# Patient Record
Sex: Female | Born: 1990 | Race: White | Hispanic: No | Marital: Married | Smoking: Former smoker
Health system: Southern US, Community
[De-identification: ages and names within clinical notes are randomized; demographics above are authoritative.]

## PROBLEM LIST (undated history)

## (undated) DIAGNOSIS — K219 Gastro-esophageal reflux disease without esophagitis: Secondary | ICD-10-CM

## (undated) DIAGNOSIS — R0602 Shortness of breath: Secondary | ICD-10-CM

## (undated) DIAGNOSIS — K859 Acute pancreatitis without necrosis or infection, unspecified: Secondary | ICD-10-CM

## (undated) DIAGNOSIS — Z7189 Other specified counseling: Secondary | ICD-10-CM

## (undated) DIAGNOSIS — K589 Irritable bowel syndrome without diarrhea: Secondary | ICD-10-CM

## (undated) DIAGNOSIS — I10 Essential (primary) hypertension: Secondary | ICD-10-CM

## (undated) DIAGNOSIS — K760 Fatty (change of) liver, not elsewhere classified: Secondary | ICD-10-CM

## (undated) DIAGNOSIS — M549 Dorsalgia, unspecified: Secondary | ICD-10-CM

## (undated) DIAGNOSIS — D649 Anemia, unspecified: Secondary | ICD-10-CM

## (undated) DIAGNOSIS — Z9049 Acquired absence of other specified parts of digestive tract: Secondary | ICD-10-CM

## (undated) DIAGNOSIS — M2550 Pain in unspecified joint: Secondary | ICD-10-CM

## (undated) DIAGNOSIS — K829 Disease of gallbladder, unspecified: Secondary | ICD-10-CM

## (undated) DIAGNOSIS — F429 Obsessive-compulsive disorder, unspecified: Secondary | ICD-10-CM

## (undated) DIAGNOSIS — F419 Anxiety disorder, unspecified: Secondary | ICD-10-CM

## (undated) DIAGNOSIS — K5190 Ulcerative colitis, unspecified, without complications: Secondary | ICD-10-CM

## (undated) DIAGNOSIS — F329 Major depressive disorder, single episode, unspecified: Secondary | ICD-10-CM

## (undated) DIAGNOSIS — F32A Depression, unspecified: Secondary | ICD-10-CM

## (undated) HISTORY — DX: Other specified counseling: Z71.89

## (undated) HISTORY — DX: Major depressive disorder, single episode, unspecified: F32.9

## (undated) HISTORY — DX: Pain in unspecified joint: M25.50

## (undated) HISTORY — PX: TOTAL COLECTOMY: SHX852

## (undated) HISTORY — DX: Irritable bowel syndrome, unspecified: K58.9

## (undated) HISTORY — DX: Essential (primary) hypertension: I10

## (undated) HISTORY — DX: Acquired absence of other specified parts of digestive tract: Z90.49

## (undated) HISTORY — DX: Obsessive-compulsive disorder, unspecified: F42.9

## (undated) HISTORY — DX: Gastro-esophageal reflux disease without esophagitis: K21.9

## (undated) HISTORY — DX: Depression, unspecified: F32.A

## (undated) HISTORY — DX: Anxiety disorder, unspecified: F41.9

## (undated) HISTORY — PX: LAPAROSCOPIC PARTIAL PROTECTOMY: SHX5912

## (undated) HISTORY — DX: Fatty (change of) liver, not elsewhere classified: K76.0

## (undated) HISTORY — DX: Disease of gallbladder, unspecified: K82.9

## (undated) HISTORY — PX: CHOLECYSTECTOMY: SHX55

## (undated) HISTORY — PX: PERMANENT ILEOSTOMY: SHX6021

## (undated) HISTORY — DX: Dorsalgia, unspecified: M54.9

## (undated) HISTORY — DX: Anemia, unspecified: D64.9

## (undated) HISTORY — DX: Shortness of breath: R06.02

---

## 2012-11-25 DIAGNOSIS — F429 Obsessive-compulsive disorder, unspecified: Secondary | ICD-10-CM

## 2012-11-25 HISTORY — DX: Obsessive-compulsive disorder, unspecified: F42.9

## 2014-06-25 ENCOUNTER — Other Ambulatory Visit (HOSPITAL_COMMUNITY): Payer: Self-pay

## 2014-06-30 ENCOUNTER — Encounter (HOSPITAL_COMMUNITY)
Admission: RE | Admit: 2014-06-30 | Discharge: 2014-06-30 | Disposition: A | Discharge status: Home / Self Care | Payer: PRIVATE HEALTH INSURANCE | Source: Ambulatory Visit | Attending: Gastroenterology | Admitting: Gastroenterology

## 2014-06-30 DIAGNOSIS — K51918 Ulcerative colitis, unspecified with other complication: Secondary | ICD-10-CM | POA: Diagnosis not present

## 2014-06-30 MED ORDER — SODIUM CHLORIDE 0.9 % IV SOLN
INTRAVENOUS | Status: DC
  Administered 2014-06-30: 10:00:00 via INTRAVENOUS

## 2014-06-30 MED ORDER — SODIUM CHLORIDE 0.9 % IV SOLN
10.0000 mg/kg | INTRAVENOUS | Status: DC
  Administered 2014-06-30: 1000 mg via INTRAVENOUS
  Filled 2014-06-30: qty 100

## 2014-07-17 ENCOUNTER — Inpatient Hospital Stay (HOSPITAL_COMMUNITY)
Admission: EM | Admit: 2014-07-17 | Discharge: 2014-07-20 | DRG: 387 | Disposition: A | Discharge status: Home / Self Care | Payer: PRIVATE HEALTH INSURANCE | Attending: Internal Medicine | Admitting: Internal Medicine

## 2014-07-17 ENCOUNTER — Encounter (HOSPITAL_COMMUNITY): Payer: Self-pay | Admitting: *Deleted

## 2014-07-17 DIAGNOSIS — K51919 Ulcerative colitis, unspecified with unspecified complications: Secondary | ICD-10-CM | POA: Diagnosis not present

## 2014-07-17 DIAGNOSIS — E860 Dehydration: Secondary | ICD-10-CM | POA: Diagnosis present

## 2014-07-17 DIAGNOSIS — Z888 Allergy status to other drugs, medicaments and biological substances status: Secondary | ICD-10-CM

## 2014-07-17 DIAGNOSIS — F419 Anxiety disorder, unspecified: Secondary | ICD-10-CM | POA: Diagnosis present

## 2014-07-17 DIAGNOSIS — K5190 Ulcerative colitis, unspecified, without complications: Secondary | ICD-10-CM | POA: Diagnosis not present

## 2014-07-17 HISTORY — DX: Ulcerative colitis, unspecified, without complications: K51.90

## 2014-07-17 HISTORY — DX: Acute pancreatitis without necrosis or infection, unspecified: K85.90

## 2014-07-17 LAB — CBC WITH DIFFERENTIAL/PLATELET
Basophils Absolute: 0 10*3/uL (ref 0.0–0.1)
Basophils Relative: 0 % (ref 0–1)
Eosinophils Absolute: 0.1 10*3/uL (ref 0.0–0.7)
Eosinophils Relative: 1 % (ref 0–5)
HCT: 33.4 % — ABNORMAL LOW (ref 36.0–46.0)
Hemoglobin: 10.4 g/dL — ABNORMAL LOW (ref 12.0–15.0)
Lymphocytes Relative: 30 % (ref 12–46)
Lymphs Abs: 4.3 10*3/uL — ABNORMAL HIGH (ref 0.7–4.0)
MCH: 25.2 pg — ABNORMAL LOW (ref 26.0–34.0)
MCHC: 31.1 g/dL (ref 30.0–36.0)
MCV: 80.9 fL (ref 78.0–100.0)
Monocytes Absolute: 1.3 10*3/uL — ABNORMAL HIGH (ref 0.1–1.0)
Monocytes Relative: 9 % (ref 3–12)
Neutro Abs: 8.5 10*3/uL — ABNORMAL HIGH (ref 1.7–7.7)
Neutrophils Relative %: 60 % (ref 43–77)
Platelets: 648 10*3/uL — ABNORMAL HIGH (ref 150–400)
RBC: 4.13 MIL/uL (ref 3.87–5.11)
RDW: 16.1 % — ABNORMAL HIGH (ref 11.5–15.5)
WBC Morphology: INCREASED
WBC: 14.2 10*3/uL — ABNORMAL HIGH (ref 4.0–10.5)

## 2014-07-17 LAB — COMPREHENSIVE METABOLIC PANEL
ALT: 30 U/L (ref 0–35)
AST: 31 U/L (ref 0–37)
Albumin: 2.2 g/dL — ABNORMAL LOW (ref 3.5–5.2)
Alkaline Phosphatase: 105 U/L (ref 39–117)
Anion gap: 9 (ref 5–15)
BUN: 7 mg/dL (ref 6–23)
CO2: 27 mmol/L (ref 19–32)
Calcium: 8.1 mg/dL — ABNORMAL LOW (ref 8.4–10.5)
Chloride: 97 mEq/L (ref 96–112)
Creatinine, Ser: 1.16 mg/dL — ABNORMAL HIGH (ref 0.50–1.10)
GFR calc Af Amer: 75 mL/min — ABNORMAL LOW (ref >90)
GFR calc non Af Amer: 65 mL/min — ABNORMAL LOW (ref >90)
Glucose, Bld: 129 mg/dL — ABNORMAL HIGH (ref 70–99)
Potassium: 3.2 mmol/L — ABNORMAL LOW (ref 3.5–5.1)
Sodium: 133 mmol/L — ABNORMAL LOW (ref 135–145)
Total Bilirubin: 0.2 mg/dL — ABNORMAL LOW (ref 0.3–1.2)
Total Protein: 6.3 g/dL (ref 6.0–8.3)

## 2014-07-17 LAB — MAGNESIUM: Magnesium: 1.6 mg/dL (ref 1.5–2.5)

## 2014-07-17 LAB — LIPASE, BLOOD: Lipase: 22 U/L (ref 11–59)

## 2014-07-17 MED ORDER — SODIUM CHLORIDE 0.9 % IV BOLUS (SEPSIS)
1000.0000 mL | Freq: Once | INTRAVENOUS | Status: AC
  Administered 2014-07-17: 1000 mL via INTRAVENOUS

## 2014-07-17 MED ORDER — SODIUM CHLORIDE 0.9 % IV SOLN
INTRAVENOUS | Status: DC
  Administered 2014-07-18 – 2014-07-19 (×3): via INTRAVENOUS

## 2014-07-17 MED ORDER — ONDANSETRON HCL 4 MG/2ML IJ SOLN
4.0000 mg | Freq: Once | INTRAMUSCULAR | Status: AC
  Administered 2014-07-17: 4 mg via INTRAVENOUS
  Filled 2014-07-17: qty 2

## 2014-07-17 MED ORDER — POTASSIUM CHLORIDE 10 MEQ/100ML IV SOLN
10.0000 meq | INTRAVENOUS | Status: AC
  Administered 2014-07-18 (×3): 10 meq via INTRAVENOUS
  Filled 2014-07-17 (×3): qty 100

## 2014-07-17 MED ORDER — MORPHINE SULFATE 4 MG/ML IJ SOLN
4.0000 mg | Freq: Once | INTRAMUSCULAR | Status: AC
  Administered 2014-07-18: 4 mg via INTRAVENOUS
  Filled 2014-07-17: qty 1

## 2014-07-17 NOTE — ED Notes (Signed)
Pt reports a UC flare up for the past month but in the past week abdominal pain and n/v has been much worse. Pt reports bloody diarrhea, decrease in food and fluid intake, and feeling dehydrated. Pt's HR 147 in triage.

## 2014-07-17 NOTE — ED Provider Notes (Signed)
CSN: [id number]     Arrival date & time 07/17/14  2158 History   First MD Initiated Contact with Patient 07/17/14 2256     Chief Complaint  Patient presents with  . Abdominal Pain  . Nausea  . Emesis     (Consider location/radiation/quality/duration/timing/severity/associated sxs/prior Treatment) HPI Comments: Patient with a history of ulcerative colitis received a Remicade injection by Dr. Ronny Flurry on the fourth.  She's recently moved from [place] to [place].  She states she's had ulcerative colitis for many years.  Had gallbladder surgery but other than that, no intestinal surgery reports that her ulcerative colitis.  His pain colitis, not a specific area of her intestines.  She is on chronic steroids at 15 mg per day.  He is currently on magnesium supplementation.  Protonix, birth control, and Xanax for anxiety. Reports that for the past 6 weeks.  She's had chronic loose stools which contain small blood she's had nausea and vomiting.  She does have Zofran at home, which she used yesterday but none today.  She's been trying to sip on fluids and tolerating small amounts at a time.  She reports that the diarrhea has worsened over the past week to 10-15 bowel movements per day  Patient is a 24 y.o. female presenting with abdominal pain and vomiting. The history is provided by the patient.  Abdominal Pain Pain location:  Generalized (rectal) Pain quality: cramping   Pain severity:  Moderate Onset quality:  Gradual Timing:  Constant Progression:  Worsening Chronicity:  Chronic Context: retching   Context comment:  Ulceratie colitis Relieved by:  Nothing Worsened by:  Eating Ineffective treatments:  None tried Associated symptoms: diarrhea, hematochezia, nausea and vomiting   Associated symptoms: no chest pain and no fever   Emesis Associated symptoms: abdominal pain and diarrhea     Past Medical History  Diagnosis Date  . UC (ulcerative colitis)   . Pancreatitis    Past  Surgical History  Procedure Laterality Date  . Cholecystectomy     History reviewed. No pertinent family history. History  Substance Use Topics  . Smoking status: Never Smoker   . Smokeless tobacco: Never Used  . Alcohol Use: No   OB History    No data available     Review of Systems  Constitutional: Negative for fever.  Cardiovascular: Negative for chest pain.  Gastrointestinal: Positive for nausea, vomiting, abdominal pain, diarrhea, blood in stool and hematochezia.  All other systems reviewed and are negative.     Allergies  Nsaids  Home Medications   Prior to Admission medications   Medication Sig Start Date End Date Taking? Authorizing Provider  ALPRAZolam Duanne Moron) 0.5 MG tablet Take 0.5 mg by mouth every 8 (eight) hours as needed for anxiety.   Yes Historical Provider, MD  Norgestimate-Ethinyl Estradiol Triphasic 0.18/0.215/0.25 MG-25 MCG tab Take 1 tablet by mouth daily.   Yes Historical Provider, MD  OLANZapine-FLUoxetine (SYMBYAX) 3-25 MG per capsule Take 1 capsule by mouth every evening.   Yes Historical Provider, MD  pantoprazole (PROTONIX) 20 MG tablet Take 20 mg by mouth daily.   Yes Historical Provider, MD  oxyCODONE (OXY IR/ROXICODONE) 5 MG immediate release tablet Take 1 tablet (5 mg total) by mouth every 6 (six) hours as needed for severe pain. 07/20/14   Erline Hau, MD  predniSONE (DELTASONE) 20 MG tablet Take 2 tablets (40 mg total) by mouth daily with breakfast. 07/20/14   Erline Hau, MD   BP 115/63  mmHg  Pulse 83  Temp(Src) 98.3 F (36.8 C) (Oral)  Resp 15  Ht 5' 6"  (1.676 m)  Wt 206 lb 9.6 oz (93.713 kg)  BMI 33.36 kg/m2  SpO2 94%  LMP 07/08/2014 (Exact Date) Physical Exam  Constitutional: She appears well-developed and well-nourished.  HENT:  Head: Normocephalic.  Eyes: Pupils are equal, round, and reactive to light.  Neck: Normal range of motion.  Cardiovascular: Tachycardia present.   Pulmonary/Chest: Effort  normal and breath sounds normal.  Abdominal: Soft.  Musculoskeletal: Normal range of motion.  Neurological: She is alert.  Skin: Skin is warm and dry.  Vitals reviewed.   ED Course  Procedures (including critical care time) Labs Review Labs Reviewed  CBC WITH DIFFERENTIAL - Abnormal; Notable for the following:    WBC 14.2 (*)    Hemoglobin 10.4 (*)    HCT 33.4 (*)    MCH 25.2 (*)    RDW 16.1 (*)    Platelets 648 (*)    Neutro Abs 8.5 (*)    Lymphs Abs 4.3 (*)    Monocytes Absolute 1.3 (*)    All other components within normal limits  COMPREHENSIVE METABOLIC PANEL - Abnormal; Notable for the following:    Sodium 133 (*)    Potassium 3.2 (*)    Glucose, Bld 129 (*)    Creatinine, Ser 1.16 (*)    Calcium 8.1 (*)    Albumin 2.2 (*)    Total Bilirubin 0.2 (*)    GFR calc non Af Amer 65 (*)    GFR calc Af Amer 75 (*)    All other components within normal limits  URINALYSIS, ROUTINE W REFLEX MICROSCOPIC - Abnormal; Notable for the following:    APPearance CLOUDY (*)    Ketones, ur 15 (*)    Nitrite POSITIVE (*)    Leukocytes, UA TRACE (*)    All other components within normal limits  CBC - Abnormal; Notable for the following:    WBC 11.6 (*)    Hemoglobin 10.1 (*)    HCT 32.3 (*)    MCH 25.6 (*)    RDW 16.3 (*)    Platelets 521 (*)    All other components within normal limits  BASIC METABOLIC PANEL - Abnormal; Notable for the following:    Sodium 133 (*)    Glucose, Bld 138 (*)    BUN <5 (*)    Calcium 7.6 (*)    All other components within normal limits  URINE MICROSCOPIC-ADD ON - Abnormal; Notable for the following:    Squamous Epithelial / LPF FEW (*)    Bacteria, UA MANY (*)    All other components within normal limits  BASIC METABOLIC PANEL - Abnormal; Notable for the following:    Glucose, Bld 161 (*)    BUN <5 (*)    Calcium 8.2 (*)    All other components within normal limits  CBC - Abnormal; Notable for the following:    RBC 3.67 (*)    Hemoglobin  9.1 (*)    HCT 30.0 (*)    MCH 24.8 (*)    RDW 15.8 (*)    Platelets 507 (*)    All other components within normal limits  CBC - Abnormal; Notable for the following:    RBC 3.38 (*)    Hemoglobin 8.4 (*)    HCT 27.9 (*)    MCH 24.9 (*)    RDW 16.0 (*)    Platelets 417 (*)    All  other components within normal limits  BASIC METABOLIC PANEL - Abnormal; Notable for the following:    BUN 5 (*)    Calcium 7.8 (*)    GFR calc non Af Amer 83 (*)    All other components within normal limits  CLOSTRIDIUM DIFFICILE BY PCR  LIPASE, BLOOD  PREGNANCY, URINE  MAGNESIUM    Imaging Review No results found.   EKG Interpretation   Date/Time:  Thursday July 17 2014 22:29:31 EST Ventricular Rate:  135 PR Interval:  119 QRS Duration: 79 QT Interval:  295 QTC Calculation: 442 R Axis:   103 Text Interpretation:  Sinus tachycardia Multiform ventricular premature  complexes Aberrant complex Consider right atrial enlargement Borderline  right axis deviation Borderline repolarization abnormality ED PHYSICIAN  INTERPRETATION AVAILABLE IN [facility] Confirmed by TEST, Record  ([id number]) on 07/19/2014 9:10:08 AM      MDM   Final diagnoses:  Ulcerative colitis, unspecified complication         Garald Balding, NP 07/21/14 1954  Julianne Rice, MD 07/22/14 [phone]

## 2014-07-18 DIAGNOSIS — K51919 Ulcerative colitis, unspecified with unspecified complications: Secondary | ICD-10-CM | POA: Diagnosis present

## 2014-07-18 DIAGNOSIS — K51911 Ulcerative colitis, unspecified with rectal bleeding: Secondary | ICD-10-CM | POA: Diagnosis not present

## 2014-07-18 DIAGNOSIS — K51918 Ulcerative colitis, unspecified with other complication: Secondary | ICD-10-CM

## 2014-07-18 DIAGNOSIS — Z888 Allergy status to other drugs, medicaments and biological substances status: Secondary | ICD-10-CM | POA: Diagnosis not present

## 2014-07-18 DIAGNOSIS — E860 Dehydration: Secondary | ICD-10-CM | POA: Diagnosis present

## 2014-07-18 DIAGNOSIS — K5190 Ulcerative colitis, unspecified, without complications: Secondary | ICD-10-CM | POA: Diagnosis present

## 2014-07-18 DIAGNOSIS — F419 Anxiety disorder, unspecified: Secondary | ICD-10-CM | POA: Diagnosis present

## 2014-07-18 LAB — BASIC METABOLIC PANEL
Anion gap: 7 (ref 5–15)
BUN: <5 mg/dL — ABNORMAL LOW (ref 6–23)
CO2: 28 mmol/L (ref 19–32)
Calcium: 7.6 mg/dL — ABNORMAL LOW (ref 8.4–10.5)
Chloride: 98 mEq/L (ref 96–112)
Creatinine, Ser: 0.86 mg/dL (ref 0.50–1.10)
GFR calc Af Amer: >90 mL/min (ref >90)
GFR calc non Af Amer: >90 mL/min (ref >90)
Glucose, Bld: 138 mg/dL — ABNORMAL HIGH (ref 70–99)
Potassium: 4.3 mmol/L (ref 3.5–5.1)
Sodium: 133 mmol/L — ABNORMAL LOW (ref 135–145)

## 2014-07-18 LAB — CBC
HCT: 32.3 % — ABNORMAL LOW (ref 36.0–46.0)
Hemoglobin: 10.1 g/dL — ABNORMAL LOW (ref 12.0–15.0)
MCH: 25.6 pg — ABNORMAL LOW (ref 26.0–34.0)
MCHC: 31.3 g/dL (ref 30.0–36.0)
MCV: 82 fL (ref 78.0–100.0)
Platelets: 521 10*3/uL — ABNORMAL HIGH (ref 150–400)
RBC: 3.94 MIL/uL (ref 3.87–5.11)
RDW: 16.3 % — ABNORMAL HIGH (ref 11.5–15.5)
WBC: 11.6 10*3/uL — ABNORMAL HIGH (ref 4.0–10.5)

## 2014-07-18 LAB — URINALYSIS, ROUTINE W REFLEX MICROSCOPIC
Bilirubin Urine: NEGATIVE
Glucose, UA: NEGATIVE mg/dL
Hgb urine dipstick: NEGATIVE
Ketones, ur: 15 mg/dL — AB
Nitrite: POSITIVE — AB
Protein, ur: NEGATIVE mg/dL
Specific Gravity, Urine: 1.011 (ref 1.005–1.030)
Urobilinogen, UA: 0.2 mg/dL (ref 0.0–1.0)
pH: 6.5 (ref 5.0–8.0)

## 2014-07-18 LAB — URINE MICROSCOPIC-ADD ON

## 2014-07-18 LAB — PREGNANCY, URINE: Preg Test, Ur: NEGATIVE

## 2014-07-18 MED ORDER — NORGESTIM-ETH ESTRAD TRIPHASIC 0.18/0.215/0.25 MG-25 MCG PO TABS
1.0000 | ORAL_TABLET | Freq: Every day | ORAL | Status: DC
  Administered 2014-07-18 – 2014-07-19 (×2): 1 via ORAL

## 2014-07-18 MED ORDER — METHYLPREDNISOLONE SODIUM SUCC 125 MG IJ SOLR: 60.0000 mg | Freq: Two times a day (BID) | INTRAMUSCULAR | Status: DC

## 2014-07-18 MED ORDER — OLANZAPINE-FLUOXETINE HCL 3-25 MG PO CAPS
1.0000 | ORAL_CAPSULE | Freq: Every evening | ORAL | Status: DC
  Administered 2014-07-18 – 2014-07-19 (×2): 1 via ORAL
  Filled 2014-07-18 (×3): qty 1

## 2014-07-18 MED ORDER — PANTOPRAZOLE SODIUM 20 MG PO TBEC
20.0000 mg | DELAYED_RELEASE_TABLET | Freq: Every day | ORAL | Status: DC
  Administered 2014-07-18 – 2014-07-20 (×3): 20 mg via ORAL
  Filled 2014-07-18 (×3): qty 1

## 2014-07-18 MED ORDER — SODIUM CHLORIDE 0.9 % IJ SOLN: 3.0000 mL | Freq: Two times a day (BID) | INTRAMUSCULAR | Status: DC

## 2014-07-18 MED ORDER — ONDANSETRON HCL 4 MG/2ML IJ SOLN
4.0000 mg | Freq: Four times a day (QID) | INTRAMUSCULAR | Status: DC | PRN
  Administered 2014-07-18 – 2014-07-19 (×3): 4 mg via INTRAVENOUS
  Filled 2014-07-18 (×4): qty 2

## 2014-07-18 MED ORDER — ALPRAZOLAM 0.5 MG PO TABS
0.5000 mg | ORAL_TABLET | Freq: Three times a day (TID) | ORAL | Status: DC | PRN
  Filled 2014-07-18: qty 1

## 2014-07-18 MED ORDER — MORPHINE SULFATE 2 MG/ML IJ SOLN
2.0000 mg | INTRAMUSCULAR | Status: DC | PRN
  Administered 2014-07-18 (×5): 4 mg via INTRAVENOUS
  Administered 2014-07-19: 2 mg via INTRAVENOUS
  Administered 2014-07-19: 4 mg via INTRAVENOUS
  Administered 2014-07-19: 2 mg via INTRAVENOUS
  Administered 2014-07-19: 4 mg via INTRAVENOUS
  Filled 2014-07-18 (×2): qty 2
  Filled 2014-07-18: qty 1
  Filled 2014-07-18 (×7): qty 2

## 2014-07-18 MED ORDER — METHYLPREDNISOLONE SODIUM SUCC 125 MG IJ SOLR
60.0000 mg | Freq: Two times a day (BID) | INTRAMUSCULAR | Status: DC
  Administered 2014-07-18 – 2014-07-19 (×4): 60 mg via INTRAVENOUS
  Filled 2014-07-18 (×3): qty 0.96
  Filled 2014-07-18: qty 2
  Filled 2014-07-18: qty 0.96

## 2014-07-18 MED ORDER — MORPHINE SULFATE 4 MG/ML IJ SOLN
4.0000 mg | Freq: Once | INTRAMUSCULAR | Status: AC
  Administered 2014-07-18: 4 mg via INTRAVENOUS
  Filled 2014-07-18: qty 1

## 2014-07-18 MED ORDER — ONDANSETRON HCL 4 MG PO TABS: 4.0000 mg | ORAL_TABLET | Freq: Three times a day (TID) | ORAL | Status: DC | PRN

## 2014-07-18 NOTE — Progress Notes (Signed)
07/18/2014 0955 UR completed. Chart reviewed. Jonnie Finner RN CCM Case Mgmt phone [phone]

## 2014-07-18 NOTE — Progress Notes (Signed)
Patient seen and examined. Admitted earlier today for a UC flare. She feels like pain is somewhat improved and would like to advance her diet. Would like to keep pain meds IV for now and transition to PO in am if tolerates diet.  Domingo Mend, MD [facility] Pager: [phone]

## 2014-07-18 NOTE — Progress Notes (Signed)
Report received from Tiffany, RN.

## 2014-07-18 NOTE — Progress Notes (Signed)
Pt has arrived to the unit.

## 2014-07-18 NOTE — H&P (Signed)
[facility] History and Physical  Diana Page LZJ:[id number] DOB: [birth year] DOA: 07/17/2014  Referring physician: EDP PCP: Pcp Not In System   Chief Complaint: Ulcerative colitis   HPI: Diana Page is a 24 y.o. female who presents to the ED with ulcerative colitis flare up for the past 1 month.  UC has never really been under control but has been especially worse and associated with nausea and vomiting for the past 2-3 days.  She has had UC flares cause N/V in the past as well.  She is currently seeing Dr. Benson Norway who tried her on remicaid earlier this month (which has failed).  No fever, no chills, has significant amount of bloody diarrhea and rectal pain c/w her typical UC symptoms.  Has N/V which she has had in the past with severe UC symptoms.  Review of Systems: Systems reviewed.  As above, otherwise negative  Past Medical History  Diagnosis Date  . UC (ulcerative colitis)   . Pancreatitis    Past Surgical History  Procedure Laterality Date  . Cholecystectomy     Social History:  reports that she has never smoked. She has never used smokeless tobacco. She reports that she does not drink alcohol or use illicit drugs.  Allergies  Allergen Reactions  . Nsaids Other (See Comments)    Because of her UC    History reviewed. No pertinent family history.   Prior to Admission medications   Medication Sig Start Date End Date Taking? Authorizing Provider  ALPRAZolam Duanne Moron) 0.5 MG tablet Take 0.5 mg by mouth every 8 (eight) hours as needed for anxiety.   Yes Historical Provider, MD  Norgestimate-Ethinyl Estradiol Triphasic 0.18/0.215/0.25 MG-25 MCG tab Take 1 tablet by mouth daily.   Yes Historical Provider, MD  OLANZapine-FLUoxetine (SYMBYAX) 3-25 MG per capsule Take 1 capsule by mouth every evening.   Yes Historical Provider, MD  ondansetron (ZOFRAN) 4 MG tablet Take 4 mg by mouth every 8 (eight) hours as needed for nausea or vomiting.   Yes Historical Provider, MD   pantoprazole (PROTONIX) 20 MG tablet Take 20 mg by mouth daily.   Yes Historical Provider, MD  predniSONE (DELTASONE) 5 MG tablet Take 40 mg by mouth daily with breakfast.   Yes Historical Provider, MD   Physical Exam: Filed Vitals:   07/17/14 2209  BP: 133/87  Pulse: 147  Temp: 98.8 F (37.1 C)  Resp: 18    BP 133/87 mmHg  Pulse 147  Temp(Src) 98.8 F (37.1 C) (Oral)  Resp 18  Ht 5' 2"  (1.575 m)  Wt 92.534 kg (204 lb)  BMI 37.30 kg/m2  SpO2 94%  LMP 07/08/2014 (Exact Date)  General Appearance:    Alert, oriented, no distress, appears stated age  Head:    Normocephalic, atraumatic  Eyes:    PERRL, EOMI, sclera non-icteric        Nose:   Nares without drainage or epistaxis. Mucosa, turbinates normal  Throat:   Moist mucous membranes. Oropharynx without erythema or exudate.  Neck:   Supple. No carotid bruits.  No thyromegaly.  No lymphadenopathy.   Back:     No CVA tenderness, no spinal tenderness  Lungs:     Clear to auscultation bilaterally, without wheezes, rhonchi or rales  Chest wall:    No tenderness to palpitation  Heart:    Regular rate and rhythm without murmurs, gallops, rubs  Abdomen:     Soft, non-tender, nondistended, normal bowel sounds, no organomegaly  Genitalia:    deferred  Rectal:    deferred  Extremities:   No clubbing, cyanosis or edema.  Pulses:   2+ and symmetric all extremities  Skin:   Skin color, texture, turgor normal, no rashes or lesions  Lymph nodes:   Cervical, supraclavicular, and axillary nodes normal  Neurologic:   CNII-XII intact. Normal strength, sensation and reflexes      throughout    Labs on Admission:  Basic Metabolic Panel:  Recent Labs Lab 07/17/14 2216 07/17/14 2225  NA 133*  --   K 3.2*  --   CL 97  --   CO2 27  --   GLUCOSE 129*  --   BUN 7  --   CREATININE 1.16*  --   CALCIUM 8.1*  --   MG  --  1.6   Liver Function Tests:  Recent Labs Lab 07/17/14 2216  AST 31  ALT 30  ALKPHOS 105  BILITOT 0.2*   PROT 6.3  ALBUMIN 2.2*    Recent Labs Lab 07/17/14 2216  LIPASE 22   No results for input(s): AMMONIA in the last 168 hours. CBC:  Recent Labs Lab 07/17/14 2216  WBC 14.2*  NEUTROABS 8.5*  HGB 10.4*  HCT 33.4*  MCV 80.9  PLT 648*   Cardiac Enzymes: No results for input(s): CKTOTAL, CKMB, CKMBINDEX, TROPONINI in the last 168 hours.  BNP (last 3 results) No results for input(s): PROBNP in the last 8760 hours. CBG: No results for input(s): GLUCAP in the last 168 hours.  Radiological Exams on Admission: No results found.  EKG: Independently reviewed.  Assessment/Plan Principal Problem:   Ulcerative colitis   1. UC flare - Spoke with Dr. Benson Norway who dosent think she needs another CT scan at this point and that infection seems less likely given her very difficult to control UC and symptoms c/w this as well as her benign abdominal exam. 1. He recommends increasing prednisone dose to 40m PO daily (will have to use solumedrol for now as she isnt really tolerating POs due to N/V). 2. Hydrating with IVF 3. Tele monitor for tachycardia.  Believe tachycardia may be due to dehydration, given that we have already seen this improve to the low 100s after IVF in the ED 4. Clear liquid diet. 5. Zofran PRN nausea, morphine PRN pain 6. Repeat CBC in AM to trend leukocytosis. 7. Dr. HBenson Norwayis uncertain that he will be able to make it in to the hospital at all tomorrow due to the exceptional weather that is forecast for the local area. 8. He does wish to switch biologics as outpatient, plans on trying 2 other biologics at this point.  If all that fails then patient may need colectomy. 2. SCDs for DVT ppx    Code Status: Full Code  Family Communication: No family in room Disposition Plan: Admit to inpatient   Time spent: 79min  GARDNER, JARED M. [facility] Pager [phone] If 7AM-7PM, please contact the day team taking care of the patient [email or web address] Password  [email or web address] 07/18/2014, 1:26 AM

## 2014-07-19 LAB — CBC
HCT: 30 % — ABNORMAL LOW (ref 36.0–46.0)
Hemoglobin: 9.1 g/dL — ABNORMAL LOW (ref 12.0–15.0)
MCH: 24.8 pg — ABNORMAL LOW (ref 26.0–34.0)
MCHC: 30.3 g/dL (ref 30.0–36.0)
MCV: 81.7 fL (ref 78.0–100.0)
Platelets: 507 10*3/uL — ABNORMAL HIGH (ref 150–400)
RBC: 3.67 MIL/uL — ABNORMAL LOW (ref 3.87–5.11)
RDW: 15.8 % — ABNORMAL HIGH (ref 11.5–15.5)
WBC: 9.6 10*3/uL (ref 4.0–10.5)

## 2014-07-19 LAB — BASIC METABOLIC PANEL
Anion gap: 9 (ref 5–15)
BUN: <5 mg/dL — ABNORMAL LOW (ref 6–23)
CO2: 25 mmol/L (ref 19–32)
Calcium: 8.2 mg/dL — ABNORMAL LOW (ref 8.4–10.5)
Chloride: 103 mmol/L (ref 96–112)
Creatinine, Ser: 0.85 mg/dL (ref 0.50–1.10)
GFR calc Af Amer: >90 mL/min (ref >90)
GFR calc non Af Amer: >90 mL/min (ref >90)
Glucose, Bld: 161 mg/dL — ABNORMAL HIGH (ref 70–99)
Potassium: 4.6 mmol/L (ref 3.5–5.1)
Sodium: 137 mmol/L (ref 135–145)

## 2014-07-19 MED ORDER — PREDNISONE 50 MG PO TABS
60.0000 mg | ORAL_TABLET | Freq: Every day | ORAL | Status: DC
  Administered 2014-07-20: 60 mg via ORAL
  Filled 2014-07-19 (×2): qty 1

## 2014-07-19 MED ORDER — OXYCODONE HCL 5 MG PO TABS
5.0000 mg | ORAL_TABLET | Freq: Four times a day (QID) | ORAL | Status: DC | PRN
  Administered 2014-07-19 – 2014-07-20 (×2): 5 mg via ORAL
  Filled 2014-07-19 (×2): qty 1

## 2014-07-19 NOTE — Progress Notes (Signed)
[facility] PROGRESS NOTE  Diana Page JAS:[id number] DOB: 1991-05-01 DOA: 07/17/2014 PCP: Pcp Not In System  Assessment/Plan: Ulcerative Colitis with Acute Flare -Much improved today. -Tolerating solid diet. -Will transition pain meds/steroids to PO. -Would like to DC in am if feeling better. -Will follow up as an OP with Dr. Benson Norway.  Code Status: Full Code Family Communication: Patient only  Disposition Plan: Home when ready; likely in 24-48 hours.   Consultants:  None   Antibiotics:  None   Subjective: Feels much better; no N/V, less abdominal pain, minimal rectal bleeding.  Objective: Filed Vitals:   07/18/14 1310 07/18/14 2107 07/19/14 0522 07/19/14 1531  BP: 114/64 133/82 109/66 126/61  Pulse:  73 68   Temp: 98.7 F (37.1 C) 97.6 F (36.4 C) 97.5 F (36.4 C) 98.3 F (36.8 C)  TempSrc: Oral   Oral  Resp: 16  17 18   Height:      Weight:      SpO2: 95% 97% 97% 97%    Intake/Output Summary (Last 24 hours) at 07/19/14 1639 Last data filed at 07/18/14 1854  Gross per 24 hour  Intake 1362.5 ml  Output      0 ml  Net 1362.5 ml   Filed Weights   07/17/14 2209 07/18/14 0221  Weight: 92.534 kg (204 lb) 93.713 kg (206 lb 9.6 oz)    Exam:   General:  AA Ox3  Cardiovascular: RRR  Respiratory: CTA B  Abdomen: S/NT/ND/+BS  Extremities: no C/C/E   Neurologic:  Intact/non-focal  Data Reviewed: Basic Metabolic Panel:  Recent Labs Lab 07/17/14 2216 07/17/14 2225 07/18/14 0553 07/19/14 0551  NA 133*  --  133* 137  K 3.2*  --  4.3 4.6  CL 97  --  98 103  CO2 27  --  28 25  GLUCOSE 129*  --  138* 161*  BUN 7  --  <5* <5*  CREATININE 1.16*  --  0.86 0.85  CALCIUM 8.1*  --  7.6* 8.2*  MG  --  1.6  --   --    Liver Function Tests:  Recent Labs Lab 07/17/14 2216  AST 31  ALT 30  ALKPHOS 105  BILITOT 0.2*  PROT 6.3  ALBUMIN 2.2*    Recent Labs Lab 07/17/14 2216  LIPASE 22   No results for input(s): AMMONIA in the  last 168 hours. CBC:  Recent Labs Lab 07/17/14 2216 07/18/14 0553 07/19/14 0551  WBC 14.2* 11.6* 9.6  NEUTROABS 8.5*  --   --   HGB 10.4* 10.1* 9.1*  HCT 33.4* 32.3* 30.0*  MCV 80.9 82.0 81.7  PLT 648* 521* 507*   Cardiac Enzymes: No results for input(s): CKTOTAL, CKMB, CKMBINDEX, TROPONINI in the last 168 hours. BNP (last 3 results) No results for input(s): PROBNP in the last 8760 hours. CBG: No results for input(s): GLUCAP in the last 168 hours.  No results found for this or any previous visit (from the past 240 hour(s)).   Studies: No results found.  Scheduled Meds: . methylPREDNISolone (SOLU-MEDROL) injection  60 mg Intravenous Q12H  . Norgestimate-Ethinyl Estradiol Triphasic  1 tablet Oral Daily  . OLANZapine-FLUoxetine  1 capsule Oral QPM  . pantoprazole  20 mg Oral Daily  . sodium chloride  3 mL Intravenous Q12H   Continuous Infusions: . sodium chloride 125 mL/hr at 07/18/14 1650    Principal Problem:   Ulcerative colitis    Time spent: 25 minutes. Greater than 50% of this time was  spent in direct contact with the patient coordinating care.    Lelon Frohlich  [facility] Pager [phone]  If 7PM-7AM, please contact night-coverage at [email or web address], password [facility] 07/19/2014, 4:39 PM  LOS: 2 days

## 2014-07-20 LAB — CBC
HCT: 27.9 % — ABNORMAL LOW (ref 36.0–46.0)
Hemoglobin: 8.4 g/dL — ABNORMAL LOW (ref 12.0–15.0)
MCH: 24.9 pg — ABNORMAL LOW (ref 26.0–34.0)
MCHC: 30.1 g/dL (ref 30.0–36.0)
MCV: 82.5 fL (ref 78.0–100.0)
Platelets: 417 10*3/uL — ABNORMAL HIGH (ref 150–400)
RBC: 3.38 MIL/uL — ABNORMAL LOW (ref 3.87–5.11)
RDW: 16 % — ABNORMAL HIGH (ref 11.5–15.5)
WBC: 8.8 10*3/uL (ref 4.0–10.5)

## 2014-07-20 LAB — BASIC METABOLIC PANEL
Anion gap: 8 (ref 5–15)
BUN: 5 mg/dL — ABNORMAL LOW (ref 6–23)
CO2: 25 mmol/L (ref 19–32)
Calcium: 7.8 mg/dL — ABNORMAL LOW (ref 8.4–10.5)
Chloride: 104 mmol/L (ref 96–112)
Creatinine, Ser: 0.95 mg/dL (ref 0.50–1.10)
GFR calc Af Amer: >90 mL/min (ref >90)
GFR calc non Af Amer: 83 mL/min — ABNORMAL LOW (ref >90)
Glucose, Bld: 88 mg/dL (ref 70–99)
Potassium: 3.8 mmol/L (ref 3.5–5.1)
Sodium: 137 mmol/L (ref 135–145)

## 2014-07-20 LAB — CLOSTRIDIUM DIFFICILE BY PCR: Toxigenic C. Difficile by PCR: NEGATIVE

## 2014-07-20 MED ORDER — OXYCODONE HCL 5 MG PO TABS: 5.0000 mg | ORAL_TABLET | Freq: Four times a day (QID) | ORAL | Status: DC | PRN

## 2014-07-20 MED ORDER — PREDNISONE 20 MG PO TABS: 40.0000 mg | ORAL_TABLET | Freq: Every day | ORAL | Status: DC

## 2014-07-20 NOTE — Progress Notes (Signed)
NURSING PROGRESS NOTE  Diana Page [id number] Discharge Data: 07/20/2014 3:34 PM Attending Provider: Koleen Nimrod Acost* PCP:Pcp Not In [place] to be D/C'd Home per MD order.    All IV's will be discontinued and monitored for bleeding.  All belongings will be returned to patient for patient to take home.  Last Documented Vital Signs:  Blood pressure 115/63, pulse 83, temperature 98.3 F (36.8 C), temperature source Oral, resp. rate 15, height 5' 6"  (1.676 m), weight 93.713 kg (206 lb 9.6 oz), last menstrual period 07/08/2014, SpO2 94 %.  Joslyn Hy, MSN, RN, [employer]

## 2014-07-20 NOTE — Discharge Summary (Addendum)
Physician Discharge Summary  Alexi Dorminey IRW:[id number] DOB: 1990-08-12 DOA: 07/17/2014  PCP: Pcp Not In System  Admit date: 07/17/2014 Discharge date: 07/20/2014  Time spent: 45 minutes  Recommendations for Outpatient Follow-up:  -Will be discharged home today. -Will follow up with Dr. Benson Norway in 1 week.   Discharge Diagnoses:  Principal Problem:   Ulcerative colitis   Discharge Condition: Stable and improved  Filed Weights   07/17/14 2209 07/18/14 0221  Weight: 92.534 kg (204 lb) 93.713 kg (206 lb 9.6 oz)    History of present illness:  [name] is a 24 y.o. female who presents to the ED with ulcerative colitis flare up for the past 1 month. UC has never really been under control but has been especially worse and associated with nausea and vomiting for the past 2-3 days. She has had UC flares cause N/V in the past as well. She is currently seeing Dr. Benson Norway who tried her on remicaid earlier this month (which has failed).  No fever, no chills, has significant amount of bloody diarrhea and rectal pain c/w her typical UC symptoms. Has N/V which she has had in the past with severe UC symptoms.  Hospital Course:   Ulcerative Colitis with Acute Flare -Much improved today, altho still minimal rectal bleeding. -Tolerating solid diet. -No N/V. -Feels like she can manage her symptoms at home and is asking to be discharged today. -Will be discharged on prednisone 40 mg per Dr. Ulyses Amor recommendations. -Will see Dr. Benson Norway within 1 week. Advised to not taper steroids until seen by him in the office.  Procedures:  None   Consultations:  None  Discharge Instructions  Discharge Instructions    Increase activity slowly    Complete by:  As directed             Medication List    STOP taking these medications        ondansetron 4 MG tablet  Commonly known as:  ZOFRAN      TAKE these medications        ALPRAZolam 0.5 MG tablet  Commonly known as:  XANAX    Take 0.5 mg by mouth every 8 (eight) hours as needed for anxiety.     Norgestimate-Ethinyl Estradiol Triphasic 0.18/0.215/0.25 MG-25 MCG tab  Take 1 tablet by mouth daily.     OLANZapine-FLUoxetine 3-25 MG per capsule  Commonly known as:  SYMBYAX  Take 1 capsule by mouth every evening.     oxyCODONE 5 MG immediate release tablet  Commonly known as:  Oxy IR/ROXICODONE  Take 1 tablet (5 mg total) by mouth every 6 (six) hours as needed for severe pain.     pantoprazole 20 MG tablet  Commonly known as:  PROTONIX  Take 20 mg by mouth daily.     predniSONE 20 MG tablet  Commonly known as:  DELTASONE  Take 2 tablets (40 mg total) by mouth daily with breakfast.       Allergies  Allergen Reactions  . Nsaids Other (See Comments)    Because of her UC       Follow-up Information    Follow up with HUNG,PATRICK D, MD In 1 week.   Specialty:  Gastroenterology   Contact information:   [address] [place] [address] [phone]        The results of significant diagnostics from this hospitalization (including imaging, microbiology, ancillary and laboratory) are listed below for reference.    Significant Diagnostic Studies: No results  found.  Microbiology: Recent Results (from the past 240 hour(s))  Clostridium Difficile by PCR     Status: None   Collection Time: 07/19/14  8:28 PM  Result Value Ref Range Status   C difficile by pcr NEGATIVE NEGATIVE Final     Labs: Basic Metabolic Panel:  Recent Labs Lab 07/17/14 2216 07/17/14 2225 07/18/14 0553 07/19/14 0551 07/20/14 0452  NA 133*  --  133* 137 137  K 3.2*  --  4.3 4.6 3.8  CL 97  --  98 103 104  CO2 27  --  28 25 25   GLUCOSE 129*  --  138* 161* 88  BUN 7  --  <5* <5* 5*  CREATININE 1.16*  --  0.86 0.85 0.95  CALCIUM 8.1*  --  7.6* 8.2* 7.8*  MG  --  1.6  --   --   --    Liver Function Tests:  Recent Labs Lab 07/17/14 2216  AST 31  ALT 30  ALKPHOS 105  BILITOT 0.2*  PROT 6.3   ALBUMIN 2.2*    Recent Labs Lab 07/17/14 2216  LIPASE 22   No results for input(s): AMMONIA in the last 168 hours. CBC:  Recent Labs Lab 07/17/14 2216 07/18/14 0553 07/19/14 0551 07/20/14 0452  WBC 14.2* 11.6* 9.6 8.8  NEUTROABS 8.5*  --   --   --   HGB 10.4* 10.1* 9.1* 8.4*  HCT 33.4* 32.3* 30.0* 27.9*  MCV 80.9 82.0 81.7 82.5  PLT 648* 521* 507* 417*   Cardiac Enzymes: No results for input(s): CKTOTAL, CKMB, CKMBINDEX, TROPONINI in the last 168 hours. BNP: BNP (last 3 results) No results for input(s): PROBNP in the last 8760 hours. CBG: No results for input(s): GLUCAP in the last 168 hours.     SignedLelon Frohlich  [facility] Pager: [phone] 07/20/2014, 2:18 PM

## 2014-07-28 ENCOUNTER — Other Ambulatory Visit (HOSPITAL_COMMUNITY): Payer: Self-pay | Admitting: *Deleted

## 2014-07-28 ENCOUNTER — Encounter (HOSPITAL_COMMUNITY): Payer: Self-pay

## 2014-07-30 ENCOUNTER — Encounter (HOSPITAL_COMMUNITY)
Admission: RE | Admit: 2014-07-30 | Discharge: 2014-07-30 | Disposition: A | Discharge status: Home / Self Care | Payer: PRIVATE HEALTH INSURANCE | Source: Ambulatory Visit | Attending: Gastroenterology | Admitting: Gastroenterology

## 2014-07-30 DIAGNOSIS — K51918 Ulcerative colitis, unspecified with other complication: Secondary | ICD-10-CM | POA: Insufficient documentation

## 2014-07-30 MED ORDER — SODIUM CHLORIDE 0.9 % IV SOLN
INTRAVENOUS | Status: DC
  Administered 2014-07-30: 250 mL via INTRAVENOUS

## 2014-07-30 MED ORDER — VEDOLIZUMAB 300 MG IV SOLR
300.0000 mg | INTRAVENOUS | Status: DC
  Administered 2014-07-30: 300 mg via INTRAVENOUS
  Filled 2014-07-30: qty 5

## 2014-08-06 ENCOUNTER — Encounter (HOSPITAL_COMMUNITY)
Admission: RE | Admit: 2014-08-06 | Discharge: 2014-08-06 | Disposition: A | Discharge status: Home / Self Care | Payer: PRIVATE HEALTH INSURANCE | Source: Ambulatory Visit | Attending: Gastroenterology | Admitting: Gastroenterology

## 2014-08-06 DIAGNOSIS — K51918 Ulcerative colitis, unspecified with other complication: Secondary | ICD-10-CM | POA: Diagnosis not present

## 2014-08-06 MED ORDER — SODIUM CHLORIDE 0.9 % IV SOLN
INTRAVENOUS | Status: AC
  Administered 2014-08-06: 11:00:00 via INTRAVENOUS

## 2014-08-06 MED ORDER — VEDOLIZUMAB 300 MG IV SOLR
300.0000 mg | INTRAVENOUS | Status: AC
  Administered 2014-08-06: 300 mg via INTRAVENOUS
  Filled 2014-08-06: qty 5

## 2014-08-13 DIAGNOSIS — K51918 Ulcerative colitis, unspecified with other complication: Secondary | ICD-10-CM | POA: Diagnosis not present

## 2014-08-18 ENCOUNTER — Encounter (HOSPITAL_COMMUNITY): Payer: Self-pay | Admitting: General Practice

## 2014-08-18 ENCOUNTER — Inpatient Hospital Stay (HOSPITAL_COMMUNITY)
Admission: AD | Admit: 2014-08-18 | Discharge: 2014-08-30 | DRG: 387 | Disposition: A | Discharge status: Other Acute Inpatient Hospital | Payer: PRIVATE HEALTH INSURANCE | Source: Ambulatory Visit | Attending: Gastroenterology | Admitting: Gastroenterology

## 2014-08-18 DIAGNOSIS — D649 Anemia, unspecified: Secondary | ICD-10-CM | POA: Diagnosis present

## 2014-08-18 DIAGNOSIS — R109 Unspecified abdominal pain: Secondary | ICD-10-CM | POA: Diagnosis present

## 2014-08-18 DIAGNOSIS — K51911 Ulcerative colitis, unspecified with rectal bleeding: Principal | ICD-10-CM | POA: Diagnosis present

## 2014-08-18 DIAGNOSIS — K5190 Ulcerative colitis, unspecified, without complications: Secondary | ICD-10-CM | POA: Diagnosis present

## 2014-08-18 LAB — COMPREHENSIVE METABOLIC PANEL
ALT: 180 U/L — ABNORMAL HIGH (ref 0–35)
AST: 99 U/L — ABNORMAL HIGH (ref 0–37)
Albumin: 2.4 g/dL — ABNORMAL LOW (ref 3.5–5.2)
Alkaline Phosphatase: 107 U/L (ref 39–117)
Anion gap: 11 (ref 5–15)
BUN: 5 mg/dL — ABNORMAL LOW (ref 6–23)
CO2: 24 mmol/L (ref 19–32)
Calcium: 7.9 mg/dL — ABNORMAL LOW (ref 8.4–10.5)
Chloride: 100 mmol/L (ref 96–112)
Creatinine, Ser: 0.92 mg/dL (ref 0.50–1.10)
GFR calc Af Amer: >90 mL/min (ref >90)
GFR calc non Af Amer: 86 mL/min — ABNORMAL LOW (ref >90)
Glucose, Bld: 127 mg/dL — ABNORMAL HIGH (ref 70–99)
Potassium: 3.3 mmol/L — ABNORMAL LOW (ref 3.5–5.1)
Sodium: 135 mmol/L (ref 135–145)
Total Bilirubin: 0.5 mg/dL (ref 0.3–1.2)
Total Protein: 6.1 g/dL (ref 6.0–8.3)

## 2014-08-18 LAB — CBC
HCT: 25.9 % — ABNORMAL LOW (ref 36.0–46.0)
Hemoglobin: 7.7 g/dL — ABNORMAL LOW (ref 12.0–15.0)
MCH: 22.9 pg — ABNORMAL LOW (ref 26.0–34.0)
MCHC: 29.7 g/dL — ABNORMAL LOW (ref 30.0–36.0)
MCV: 77.1 fL — ABNORMAL LOW (ref 78.0–100.0)
Platelets: 444 10*3/uL — ABNORMAL HIGH (ref 150–400)
RBC: 3.36 MIL/uL — ABNORMAL LOW (ref 3.87–5.11)
RDW: 16.7 % — ABNORMAL HIGH (ref 11.5–15.5)
WBC: 9.2 10*3/uL (ref 4.0–10.5)

## 2014-08-18 LAB — CLOSTRIDIUM DIFFICILE BY PCR: Toxigenic C. Difficile by PCR: NEGATIVE

## 2014-08-18 MED ORDER — SODIUM CHLORIDE 0.9 % IV SOLN
INTRAVENOUS | Status: DC
  Administered 2014-08-18 – 2014-08-30 (×30): via INTRAVENOUS

## 2014-08-18 MED ORDER — MORPHINE SULFATE 4 MG/ML IJ SOLN
4.0000 mg | INTRAMUSCULAR | Status: DC | PRN
  Administered 2014-08-18 – 2014-08-30 (×108): 4 mg via INTRAVENOUS
  Filled 2014-08-18 (×109): qty 1

## 2014-08-18 MED ORDER — MORPHINE SULFATE 2 MG/ML IJ SOLN
2.0000 mg | INTRAMUSCULAR | Status: DC | PRN
  Administered 2014-08-18: 2 mg via INTRAVENOUS
  Filled 2014-08-18: qty 1

## 2014-08-18 MED ORDER — METHYLPREDNISOLONE SODIUM SUCC 40 MG IJ SOLR
40.0000 mg | Freq: Two times a day (BID) | INTRAMUSCULAR | Status: DC
  Administered 2014-08-18 – 2014-08-29 (×23): 40 mg via INTRAVENOUS
  Filled 2014-08-18 (×23): qty 1

## 2014-08-18 NOTE — H&P (Addendum)
   Diana Page HPI: This is a 24 year old female with a PMH of severe UC that appears to be refractory to treatment.  She started to have symptoms in 2011 and initially it was thought that she had Crohn's, however, with further testing she was identified to have UC.  Since that time she has been treated with IV/PO steroids, mesalamine, Humira, Remicade, and 6-MP.  She moved from [place] in December 2015 and I promptly obtained approval for her to maintain her Remicade.  She was at the end of her induction period.  Unfortunately, her symptoms persisted despite an initial response to the medication.  She reports having 10-15 blood bowel movements while on PO prednisone.  No reports of any fever or severe abdominal pain.  Diana Page was then pursued in February and she had two infusions, but she does not discern any response.  The patient was in the office last week and we discussed the possibility of a colectomy, but she wanted to wait and see what happened with her third Entyvio infusion on 09/03/2014.  The decision was also made to have her evaluated at [facility] and she currently has an appointment with them at the end of March.  Past Medical History  Diagnosis Date  . UC (ulcerative colitis)   . Pancreatitis     Past Surgical History  Procedure Laterality Date  . Cholecystectomy      No family history on file.  Social History:  reports that she has never smoked. She has never used smokeless tobacco. She reports that she does not drink alcohol or use illicit drugs.  Allergies:  Allergies  Allergen Reactions  . Nsaids Other (See Comments)    Because of her UC    Medications:  Scheduled: . methylPREDNISolone (SOLU-MEDROL) injection  40 mg Intravenous Q12H   Continuous: . sodium chloride      No results found for this or any previous visit (from the past 24 hour(s)).   No results found.  ROS:  As stated above in the HPI otherwise negative.  Blood pressure 129/84, pulse 103,  temperature 99.3 F (37.4 C), temperature source Oral, resp. rate 18, height 5' 2"  (1.575 m), weight 92.08 kg (203 lb), SpO2 98 %.    PE: Gen: NAD, Alert and Oriented HEENT:  [place]/AT, EOMI Neck: Supple, no LAD Lungs: CTA Bilaterally CV: RRR without M/G/R ABM: Soft, diffuse tenderness, no rebound, +BS Ext: No C/C/E  Assessment/Plan: 1) Severe UC. 2) Anemia.   She has severe UC with her amount of bloody bowel movements.  I will try her on Solumedrol and if there is no improvement by Day 5, I will make arrangements to have her transferred to [facility].  She does not have toxic megacolon at this time, but I will be vigilant for this issue.  Ultimately, I think she will benefit from a colectomy and she has resigned herself to this possibility.  In fact, she is hoping for this outcome since she feels poorly.  Plan: 1) Solumedrol 40 mg Q12 hours. 2) Check for C. Diff. 3) IV hydration. 4) Transfuse as necessary. 5) Pain control.   ADDENDUM:  She is C. Diff negative.  Diana Page D 08/18/2014, 2:38 PM

## 2014-08-19 LAB — ABO/RH: ABO/RH(D): O POS

## 2014-08-19 LAB — PREPARE RBC (CROSSMATCH)

## 2014-08-19 MED ORDER — SODIUM CHLORIDE 0.9 % IV SOLN
Freq: Once | INTRAVENOUS | Status: AC
  Administered 2014-08-19: 12:00:00 via INTRAVENOUS

## 2014-08-19 MED ORDER — LORAZEPAM 2 MG/ML IJ SOLN: 1.0000 mg | Freq: Once | INTRAMUSCULAR | Status: DC

## 2014-08-19 MED ORDER — SODIUM CHLORIDE 0.9 % IV SOLN: Freq: Once | INTRAVENOUS | Status: AC

## 2014-08-19 NOTE — Progress Notes (Signed)
Subjective: She feels better.  Less bowel movements overnight.  Objective: Vital signs in last 24 hours: Temp:  [97.5 F (36.4 C)-99.3 F (37.4 C)] 97.5 F (36.4 C) (02/23 0556) Pulse Rate:  [76-103] 76 (02/23 0556) Resp:  [18-20] 19 (02/23 0556) BP: (125-133)/(70-84) 125/73 mmHg (02/23 0556) SpO2:  [96 %-99 %] 99 % (02/23 0556) Weight:  [92.08 kg (203 lb)] 92.08 kg (203 lb) (02/22 1427) Last BM Date: 08/18/14  Intake/Output from previous day: 02/22 0701 - 02/23 0700 In: 3023.3 [I.V.:3023.3] Out: -  Intake/Output this shift:    General appearance: alert and no distress GI: soft, non-tender; bowel sounds normal; no masses,  no organomegaly  Lab Results:  Recent Labs  08/18/14 1710  WBC 9.2  HGB 7.7*  HCT 25.9*  PLT 444*   BMET  Recent Labs  08/18/14 1710  NA 135  K 3.3*  CL 100  CO2 24  GLUCOSE 127*  BUN 5*  CREATININE 0.92  CALCIUM 7.9*   LFT  Recent Labs  08/18/14 1710  PROT 6.1  ALBUMIN 2.4*  AST 99*  ALT 180*  ALKPHOS 107  BILITOT 0.5   PT/INR No results for input(s): LABPROT, INR in the last 72 hours. Hepatitis Panel No results for input(s): HEPBSAG, HCVAB, HEPAIGM, HEPBIGM in the last 72 hours. C-Diff No results for input(s): CDIFFTOX in the last 72 hours. Fecal Lactopherrin No results for input(s): FECLLACTOFRN in the last 72 hours.  Studies/Results: No results found.  Medications:  Scheduled: . sodium chloride   Intravenous Once  . sodium chloride   Intravenous Once  . methylPREDNISolone (SOLU-MEDROL) injection  40 mg Intravenous Q12H   Continuous: . sodium chloride 200 mL/hr at 08/19/14 0609    Assessment/Plan: 1) Severe Ulcerative Colitis. 2) Anemia.   She notices a decline in the number of bowel movements overnight, which is an improvement.  Also there is a drop in her hematochezia.  These are encouraging signs.  Plan: 1) Transfuse 2 units of PRBC. 2) Continue with Solumedrol and pain control.   LOS: 1 day    Rolla Servidio D 08/19/2014, 7:54 AM

## 2014-08-19 NOTE — Progress Notes (Signed)
Patient informed the CNA and this nurse that she had six (6) small, loose BMs with blood streaks since last night and as of this writing.

## 2014-08-20 LAB — BASIC METABOLIC PANEL
Anion gap: 8 (ref 5–15)
BUN: <5 mg/dL — ABNORMAL LOW (ref 6–23)
CO2: 22 mmol/L (ref 19–32)
Calcium: 8.3 mg/dL — ABNORMAL LOW (ref 8.4–10.5)
Chloride: 108 mmol/L (ref 96–112)
Creatinine, Ser: 0.72 mg/dL (ref 0.50–1.10)
GFR calc Af Amer: >90 mL/min (ref >90)
GFR calc non Af Amer: >90 mL/min (ref >90)
Glucose, Bld: 110 mg/dL — ABNORMAL HIGH (ref 70–99)
Potassium: 4.2 mmol/L (ref 3.5–5.1)
Sodium: 138 mmol/L (ref 135–145)

## 2014-08-20 LAB — CBC
HCT: 30.7 % — ABNORMAL LOW (ref 36.0–46.0)
Hemoglobin: 9.5 g/dL — ABNORMAL LOW (ref 12.0–15.0)
MCH: 24.5 pg — ABNORMAL LOW (ref 26.0–34.0)
MCHC: 30.9 g/dL (ref 30.0–36.0)
MCV: 79.3 fL (ref 78.0–100.0)
Platelets: 342 10*3/uL (ref 150–400)
RBC: 3.87 MIL/uL (ref 3.87–5.11)
RDW: 16.2 % — ABNORMAL HIGH (ref 11.5–15.5)
WBC: 9.5 10*3/uL (ref 4.0–10.5)

## 2014-08-20 LAB — TYPE AND SCREEN
ABO/RH(D): O POS
Antibody Screen: NEGATIVE
Unit division: 0
Unit division: 0
Unit division: 0
Unit division: 0

## 2014-08-20 NOTE — Progress Notes (Signed)
Subjective: Feeling all right.  Only 4 bowel movements yesterday, but they were bloody.  Objective: Vital signs in last 24 hours: Temp:  [97.4 F (36.3 C)-98.4 F (36.9 C)] 97.4 F (36.3 C) (02/24 0536) Pulse Rate:  [64-92] 72 (02/24 0536) Resp:  [17-18] 17 (02/24 0536) BP: (118-126)/(67-78) 118/71 mmHg (02/24 0536) SpO2:  [96 %-100 %] 97 % (02/24 0536) Last BM Date: 08/19/14  Intake/Output from previous day: 02/23 0701 - 02/24 0700 In: 7191.3 [P.O.:700; I.V.:5823.3; Blood:668] Out: -  Intake/Output this shift:    General appearance: alert and no distress GI: soft, non-tender; bowel sounds normal; no masses,  no organomegaly  Lab Results:  Recent Labs  08/18/14 1710 08/20/14 0517  WBC 9.2 9.5  HGB 7.7* 9.5*  HCT 25.9* 30.7*  PLT 444* 342   BMET  Recent Labs  08/18/14 1710 08/20/14 0517  NA 135 138  K 3.3* 4.2  CL 100 108  CO2 24 22  GLUCOSE 127* 110*  BUN 5* <5*  CREATININE 0.92 0.72  CALCIUM 7.9* 8.3*   LFT  Recent Labs  08/18/14 1710  PROT 6.1  ALBUMIN 2.4*  AST 99*  ALT 180*  ALKPHOS 107  BILITOT 0.5   PT/INR No results for input(s): LABPROT, INR in the last 72 hours. Hepatitis Panel No results for input(s): HEPBSAG, HCVAB, HEPAIGM, HEPBIGM in the last 72 hours. C-Diff No results for input(s): CDIFFTOX in the last 72 hours. Fecal Lactopherrin No results for input(s): FECLLACTOFRN in the last 72 hours.  Studies/Results: No results found.  Medications:  Scheduled: . methylPREDNISolone (SOLU-MEDROL) injection  40 mg Intravenous Q12H   Continuous: . sodium chloride 200 mL/hr at 08/19/14 2010    Assessment/Plan: 1) Severe UC. 2) Anemia.   The bowel frequency has declined, but she did notice blood in her stool.  Pain is under control.  HGB has increased appropriately with blood transfusions.  Plan: 1) Continue with Solumedrol. 2) Continue with pain control. 3) ? Addition of 5-ASA now that her symptoms are improving.  She reports  being refractory to this medication in the past.   LOS: 2 days   Burris Matherne D 08/20/2014, 7:13 AM

## 2014-08-21 LAB — BASIC METABOLIC PANEL
Anion gap: 10 (ref 5–15)
BUN: <5 mg/dL — ABNORMAL LOW (ref 6–23)
CO2: 24 mmol/L (ref 19–32)
Calcium: 8.4 mg/dL (ref 8.4–10.5)
Chloride: 105 mmol/L (ref 96–112)
Creatinine, Ser: 0.77 mg/dL (ref 0.50–1.10)
GFR calc Af Amer: >90 mL/min (ref >90)
GFR calc non Af Amer: >90 mL/min (ref >90)
Glucose, Bld: 98 mg/dL (ref 70–99)
Potassium: 4.3 mmol/L (ref 3.5–5.1)
Sodium: 139 mmol/L (ref 135–145)

## 2014-08-21 LAB — CBC
HCT: 33.9 % — ABNORMAL LOW (ref 36.0–46.0)
Hemoglobin: 10.2 g/dL — ABNORMAL LOW (ref 12.0–15.0)
MCH: 24.1 pg — ABNORMAL LOW (ref 26.0–34.0)
MCHC: 30.1 g/dL (ref 30.0–36.0)
MCV: 80.1 fL (ref 78.0–100.0)
Platelets: 285 10*3/uL (ref 150–400)
RBC: 4.23 MIL/uL (ref 3.87–5.11)
RDW: 16.5 % — ABNORMAL HIGH (ref 11.5–15.5)
WBC: 7.7 10*3/uL (ref 4.0–10.5)

## 2014-08-21 LAB — C-REACTIVE PROTEIN: CRP: 7.4 mg/dL — ABNORMAL HIGH (ref <0.60)

## 2014-08-21 MED ORDER — MESALAMINE 1.2 G PO TBEC
4.8000 g | DELAYED_RELEASE_TABLET | Freq: Every day | ORAL | Status: DC
  Administered 2014-08-21 – 2014-08-29 (×9): 4.8 g via ORAL
  Filled 2014-08-21 (×10): qty 4

## 2014-08-21 MED ORDER — PANTOPRAZOLE SODIUM 20 MG PO TBEC
20.0000 mg | DELAYED_RELEASE_TABLET | Freq: Every day | ORAL | Status: DC
  Administered 2014-08-22 – 2014-08-29 (×8): 20 mg via ORAL
  Filled 2014-08-21 (×8): qty 1

## 2014-08-21 NOTE — Progress Notes (Signed)
Patient had four (4) smear BMs with red streak from 2100 up to this writing with only 1 occurrence seen by this nurse around 0500 which was scant and mucousy with red streak

## 2014-08-21 NOTE — Progress Notes (Addendum)
Subjective: No real change from yesterday.  She had 4 bowel movements and there was a significant amount of blood.  Objective: Vital signs in last 24 hours: Temp:  [97.9 F (36.6 C)-98.2 F (36.8 C)] 97.9 F (36.6 C) (02/25 0556) Pulse Rate:  [77-81] 78 (02/25 0556) Resp:  [17-18] 17 (02/25 0556) BP: (128-137)/(71-76) 137/73 mmHg (02/25 0556) SpO2:  [92 %-100 %] 92 % (02/25 0556) Last BM Date: 08/20/14  Intake/Output from previous day: 02/24 0701 - 02/25 0700 In: 4628 [P.O.:960; I.V.:3668] Out: -  Intake/Output this shift:    General appearance: alert and no distress GI: soft, non-tender; bowel sounds normal; no masses,  no organomegaly  Lab Results:  Recent Labs  08/18/14 1710 08/20/14 0517 08/21/14 0448  WBC 9.2 9.5 7.7  HGB 7.7* 9.5* 10.2*  HCT 25.9* 30.7* 33.9*  PLT 444* 342 285   BMET  Recent Labs  08/18/14 1710 08/20/14 0517 08/21/14 0448  NA 135 138 139  K 3.3* 4.2 4.3  CL 100 108 105  CO2 24 22 24   GLUCOSE 127* 110* 98  BUN 5* <5* <5*  CREATININE 0.92 0.72 0.77  CALCIUM 7.9* 8.3* 8.4   LFT  Recent Labs  08/18/14 1710  PROT 6.1  ALBUMIN 2.4*  AST 99*  ALT 180*  ALKPHOS 107  BILITOT 0.5   PT/INR No results for input(s): LABPROT, INR in the last 72 hours. Hepatitis Panel No results for input(s): HEPBSAG, HCVAB, HEPAIGM, HEPBIGM in the last 72 hours. C-Diff No results for input(s): CDIFFTOX in the last 72 hours. Fecal Lactopherrin No results for input(s): FECLLACTOFRN in the last 72 hours.  Studies/Results: No results found.  Medications:  Scheduled: . methylPREDNISolone (SOLU-MEDROL) injection  40 mg Intravenous Q12H   Continuous: . sodium chloride 200 mL/hr at 08/21/14 0513    Assessment/Plan: 1) Severe UC. 2) Anemia.   She is stable.  No improvement over the past 24 hours.  If after Day #5 she does not have any further improvement I will perform a FFS to assess her colonic mucosa and contact [facility] for a potential  transfer.  Plan: 1) Continue with Solumedrol. 2) Add 5-ASA. 3) Continue with pain medications.   ADDENDUM: She has been on 5-ASA products as an outpatient.   She did not have any cross reactivity with the medications with her allergy to NSAIDs.  LOS: 3 days   Diana Page D 08/21/2014, 7:58 AM

## 2014-08-22 LAB — BASIC METABOLIC PANEL
Anion gap: 9 (ref 5–15)
BUN: 5 mg/dL — ABNORMAL LOW (ref 6–23)
CO2: 25 mmol/L (ref 19–32)
Calcium: 8.3 mg/dL — ABNORMAL LOW (ref 8.4–10.5)
Chloride: 103 mmol/L (ref 96–112)
Creatinine, Ser: 0.79 mg/dL (ref 0.50–1.10)
GFR calc Af Amer: >90 mL/min (ref >90)
GFR calc non Af Amer: >90 mL/min (ref >90)
Glucose, Bld: 94 mg/dL (ref 70–99)
Potassium: 4.3 mmol/L (ref 3.5–5.1)
Sodium: 137 mmol/L (ref 135–145)

## 2014-08-22 LAB — CBC
HCT: 34.5 % — ABNORMAL LOW (ref 36.0–46.0)
Hemoglobin: 10.3 g/dL — ABNORMAL LOW (ref 12.0–15.0)
MCH: 24 pg — ABNORMAL LOW (ref 26.0–34.0)
MCHC: 29.9 g/dL — ABNORMAL LOW (ref 30.0–36.0)
MCV: 80.2 fL (ref 78.0–100.0)
Platelets: 396 10*3/uL (ref 150–400)
RBC: 4.3 MIL/uL (ref 3.87–5.11)
RDW: 16.6 % — ABNORMAL HIGH (ref 11.5–15.5)
WBC: 8.4 10*3/uL (ref 4.0–10.5)

## 2014-08-22 NOTE — Progress Notes (Signed)
Subjective: No real change.  In fact, there may be some worsening.  Her bowel movements are bloody.  Objective: Vital signs in last 24 hours: Temp:  [97.5 F (36.4 C)-98.7 F (37.1 C)] 97.8 F (36.6 C) (02/26 0559) Pulse Rate:  [70-78] 70 (02/26 0559) Resp:  [17-18] 18 (02/26 0559) BP: (131-138)/(70-85) 131/70 mmHg (02/26 0559) SpO2:  [94 %-96 %] 94 % (02/26 0559) Last BM Date: 08/22/14  Intake/Output from previous day: 02/25 0701 - 02/26 0700 In: 5733.3 [P.O.:570; I.V.:5163.3] Out: -  Intake/Output this shift:    General appearance: alert and no distress GI: soft, non-tender; bowel sounds normal; no masses,  no organomegaly  Lab Results:  Recent Labs  08/20/14 0517 08/21/14 0448 08/22/14 0500  WBC 9.5 7.7 8.4  HGB 9.5* 10.2* 10.3*  HCT 30.7* 33.9* 34.5*  PLT 342 285 396   BMET  Recent Labs  08/20/14 0517 08/21/14 0448 08/22/14 0500  NA 138 139 137  K 4.2 4.3 4.3  CL 108 105 103  CO2 22 24 25   GLUCOSE 110* 98 94  BUN <5* <5* 5*  CREATININE 0.72 0.77 0.79  CALCIUM 8.3* 8.4 8.3*   LFT No results for input(s): PROT, ALBUMIN, AST, ALT, ALKPHOS, BILITOT, BILIDIR, IBILI in the last 72 hours. PT/INR No results for input(s): LABPROT, INR in the last 72 hours. Hepatitis Panel No results for input(s): HEPBSAG, HCVAB, HEPAIGM, HEPBIGM in the last 72 hours. C-Diff No results for input(s): CDIFFTOX in the last 72 hours. Fecal Lactopherrin No results for input(s): FECLLACTOFRN in the last 72 hours.  Studies/Results: No results found.  Medications:  Scheduled: . mesalamine  4.8 g Oral Q breakfast  . methylPREDNISolone (SOLU-MEDROL) injection  40 mg Intravenous Q12H  . pantoprazole  20 mg Oral Daily   Continuous: . sodium chloride 200 mL/hr at 08/22/14 0528    Assessment/Plan: 1) Severe UC. 2) Anemia.   This is day #4 of Solumedrol and there does not appear to be any change to her clinical status.  I am doubtful that another day of treatment with  steroids will make difference to her clinical status.  I spoke with [facility] and they have agreed to accept the transfer.  She should be able to go over some time this weekend.  I will hold off on performing a FFS.  Plan: 1) Continue with Solumedrol. 2) Continue with Lialda. 3) Continue with pain control. 4) Await transfer to [facility].    LOS: 4 days   Faria Casella D 08/22/2014, 9:45 AM

## 2014-08-23 LAB — CBC
HCT: 29.1 % — ABNORMAL LOW (ref 36.0–46.0)
Hemoglobin: 9.2 g/dL — ABNORMAL LOW (ref 12.0–15.0)
MCH: 25.1 pg — ABNORMAL LOW (ref 26.0–34.0)
MCHC: 31.6 g/dL (ref 30.0–36.0)
MCV: 79.3 fL (ref 78.0–100.0)
Platelets: 261 10*3/uL (ref 150–400)
RBC: 3.67 MIL/uL — ABNORMAL LOW (ref 3.87–5.11)
RDW: 16.4 % — ABNORMAL HIGH (ref 11.5–15.5)
WBC: 5.7 10*3/uL (ref 4.0–10.5)

## 2014-08-23 LAB — BASIC METABOLIC PANEL
Anion gap: 9 (ref 5–15)
BUN: <5 mg/dL — ABNORMAL LOW (ref 6–23)
CO2: 29 mmol/L (ref 19–32)
Calcium: 8.4 mg/dL (ref 8.4–10.5)
Chloride: 101 mmol/L (ref 96–112)
Creatinine, Ser: 0.75 mg/dL (ref 0.50–1.10)
GFR calc Af Amer: >90 mL/min (ref >90)
GFR calc non Af Amer: >90 mL/min (ref >90)
Glucose, Bld: 98 mg/dL (ref 70–99)
Potassium: 4.1 mmol/L (ref 3.5–5.1)
Sodium: 139 mmol/L (ref 135–145)

## 2014-08-23 NOTE — Progress Notes (Signed)
Subjective: Feeling slightly better, but she had 7 bloody bowel movements yesterday.  Objective: Vital signs in last 24 hours: Temp:  [98 F (36.7 C)-98.2 F (36.8 C)] 98 F (36.7 C) (02/27 0546) Pulse Rate:  [71-79] 73 (02/27 0546) Resp:  [15-17] 17 (02/27 0546) BP: (124-143)/(81-87) 143/87 mmHg (02/27 0546) SpO2:  [95 %-98 %] 95 % (02/27 0546) Last BM Date: 08/22/14  Intake/Output from previous day: 02/26 0701 - 02/27 0700 In: 3220.8 [P.O.:480; I.V.:2740.8] Out: -  Intake/Output this shift:    General appearance: alert and no distress GI: soft, non-tender; bowel sounds normal; no masses,  no organomegaly  Lab Results:  Recent Labs  08/21/14 0448 08/22/14 0500  WBC 7.7 8.4  HGB 10.2* 10.3*  HCT 33.9* 34.5*  PLT 285 396   BMET  Recent Labs  08/21/14 0448 08/22/14 0500  NA 139 137  K 4.3 4.3  CL 105 103  CO2 24 25  GLUCOSE 98 94  BUN <5* 5*  CREATININE 0.77 0.79  CALCIUM 8.4 8.3*   LFT No results for input(s): PROT, ALBUMIN, AST, ALT, ALKPHOS, BILITOT, BILIDIR, IBILI in the last 72 hours. PT/INR No results for input(s): LABPROT, INR in the last 72 hours. Hepatitis Panel No results for input(s): HEPBSAG, HCVAB, HEPAIGM, HEPBIGM in the last 72 hours. C-Diff No results for input(s): CDIFFTOX in the last 72 hours. Fecal Lactopherrin No results for input(s): FECLLACTOFRN in the last 72 hours.  Studies/Results: No results found.  Medications:  Scheduled: . mesalamine  4.8 g Oral Q breakfast  . methylPREDNISolone (SOLU-MEDROL) injection  40 mg Intravenous Q12H  . pantoprazole  20 mg Oral Daily   Continuous: . sodium chloride 150 mL/hr at 08/23/14 3151    Assessment/Plan: 1) Severe UC refractory to current medical treatment. 2) Anemia.   Mildly better compared to yesterday, but she still has uncontrollable disease.  Transfer to [facility] is still pending.  She remains clinically stable.  No evidence of a toxic megacolon.  Plan: 1) Continue  with Solumedrol and Lialda. 2) Continue with pain control. 3) Await transfer to [facility].  LOS: 5 days   Chasyn Cinque D 08/23/2014, 8:34 AM

## 2014-08-23 NOTE — Progress Notes (Signed)
Received a call from [facility] to confirm bed need, still on the waiting list.

## 2014-08-24 LAB — BASIC METABOLIC PANEL
Anion gap: 9 (ref 5–15)
BUN: <5 mg/dL — ABNORMAL LOW (ref 6–23)
CO2: 30 mmol/L (ref 19–32)
Calcium: 8.3 mg/dL — ABNORMAL LOW (ref 8.4–10.5)
Chloride: 100 mmol/L (ref 96–112)
Creatinine, Ser: 0.74 mg/dL (ref 0.50–1.10)
GFR calc Af Amer: >90 mL/min (ref >90)
GFR calc non Af Amer: >90 mL/min (ref >90)
Glucose, Bld: 109 mg/dL — ABNORMAL HIGH (ref 70–99)
Potassium: 4 mmol/L (ref 3.5–5.1)
Sodium: 139 mmol/L (ref 135–145)

## 2014-08-24 LAB — CBC
HCT: 29.8 % — ABNORMAL LOW (ref 36.0–46.0)
Hemoglobin: 9 g/dL — ABNORMAL LOW (ref 12.0–15.0)
MCH: 24.6 pg — ABNORMAL LOW (ref 26.0–34.0)
MCHC: 30.2 g/dL (ref 30.0–36.0)
MCV: 81.4 fL (ref 78.0–100.0)
Platelets: 319 10*3/uL (ref 150–400)
RBC: 3.66 MIL/uL — ABNORMAL LOW (ref 3.87–5.11)
RDW: 16.2 % — ABNORMAL HIGH (ref 11.5–15.5)
WBC: 5.8 10*3/uL (ref 4.0–10.5)

## 2014-08-24 NOTE — Progress Notes (Signed)
Subjective: Profuse diarrhea over the past 24 hours.  Minimal bleeding.  Objective: Vital signs in last 24 hours: Temp:  [97.7 F (36.5 C)-98.9 F (37.2 C)] 97.7 F (36.5 C) (02/28 0551) Pulse Rate:  [63-76] 63 (02/28 0551) Resp:  [18] 18 (02/28 0551) BP: (102-136)/(74-94) 102/74 mmHg (02/28 0551) SpO2:  [96 %-98 %] 96 % (02/28 0551) Last BM Date: 08/24/14  Intake/Output from previous day: 02/27 0701 - 02/28 0700 In: 600 [P.O.:600] Out: -  Intake/Output this shift:    General appearance: alert and no distress GI: soft, non-tender; bowel sounds normal; no masses,  no organomegaly  Lab Results:  Recent Labs  08/22/14 0500 08/23/14 0753 08/24/14 0735  WBC 8.4 5.7 5.8  HGB 10.3* 9.2* 9.0*  HCT 34.5* 29.1* 29.8*  PLT 396 261 319   BMET  Recent Labs  08/22/14 0500 08/23/14 0753 08/24/14 0735  NA 137 139 139  K 4.3 4.1 4.0  CL 103 101 100  CO2 25 29 30   GLUCOSE 94 98 109*  BUN 5* <5* PENDING  CREATININE 0.79 0.75 0.74  CALCIUM 8.3* 8.4 8.3*   LFT No results for input(s): PROT, ALBUMIN, AST, ALT, ALKPHOS, BILITOT, BILIDIR, IBILI in the last 72 hours. PT/INR No results for input(s): LABPROT, INR in the last 72 hours. Hepatitis Panel No results for input(s): HEPBSAG, HCVAB, HEPAIGM, HEPBIGM in the last 72 hours. C-Diff No results for input(s): CDIFFTOX in the last 72 hours. Fecal Lactopherrin No results for input(s): FECLLACTOFRN in the last 72 hours.  Studies/Results: No results found.  Medications:  Scheduled: . mesalamine  4.8 g Oral Q breakfast  . methylPREDNISolone (SOLU-MEDROL) injection  40 mg Intravenous Q12H  . pantoprazole  20 mg Oral Daily   Continuous: . sodium chloride 150 mL/hr at 08/23/14 2144    Assessment/Plan: 1) Severe UC refractory to treatment. 2) Anemia.   She had a similar course when she was admitted and C. Diff was negative.  Her bowel movements did decrease.  I will monitor and if it is persistent I will recheck for C.  Diff.  Still awaiting transfer to [facility].  Plan: 1) Continue with Solumedrol and Lialda. 2) Awaiting transfer to [facility].   LOS: 6 days   Nahjae Hoeg D 08/24/2014, 8:54 AM

## 2014-08-25 LAB — CBC
HCT: 30.1 % — ABNORMAL LOW (ref 36.0–46.0)
Hemoglobin: 9.1 g/dL — ABNORMAL LOW (ref 12.0–15.0)
MCH: 24.5 pg — ABNORMAL LOW (ref 26.0–34.0)
MCHC: 30.2 g/dL (ref 30.0–36.0)
MCV: 81.1 fL (ref 78.0–100.0)
Platelets: 337 10*3/uL (ref 150–400)
RBC: 3.71 MIL/uL — ABNORMAL LOW (ref 3.87–5.11)
RDW: 16.1 % — ABNORMAL HIGH (ref 11.5–15.5)
WBC: 6.5 10*3/uL (ref 4.0–10.5)

## 2014-08-25 LAB — BASIC METABOLIC PANEL
Anion gap: 11 (ref 5–15)
BUN: <5 mg/dL — ABNORMAL LOW (ref 6–23)
CO2: 26 mmol/L (ref 19–32)
Calcium: 8.3 mg/dL — ABNORMAL LOW (ref 8.4–10.5)
Chloride: 101 mmol/L (ref 96–112)
Creatinine, Ser: 0.75 mg/dL (ref 0.50–1.10)
GFR calc Af Amer: >90 mL/min (ref >90)
GFR calc non Af Amer: >90 mL/min (ref >90)
Glucose, Bld: 109 mg/dL — ABNORMAL HIGH (ref 70–99)
Potassium: 3.9 mmol/L (ref 3.5–5.1)
Sodium: 138 mmol/L (ref 135–145)

## 2014-08-25 NOTE — Progress Notes (Signed)
[facility] called to confirm that patient still needed to be transferred.  RN confirmed.  Will cont to monitor.

## 2014-08-25 NOTE — Progress Notes (Signed)
Subjective: Some bleeding, but still with significant diarrhea.  Not as much as yesterday.  Objective: Vital signs in last 24 hours: Temp:  [97.5 F (36.4 C)-98.3 F (36.8 C)] 97.5 F (36.4 C) (02/29 0549) Pulse Rate:  [62-74] 72 (02/29 0549) Resp:  [17-18] 17 (02/29 0549) BP: (127-141)/(81-87) 138/81 mmHg (02/29 0549) SpO2:  [98 %-100 %] 100 % (02/29 0549) Last BM Date: 08/25/14  Intake/Output from previous day: 02/28 0701 - 02/29 0700 In: 4345 [P.O.:720; I.V.:3625] Out: -  Intake/Output this shift:    General appearance: alert and no distress GI: soft, non-tender; bowel sounds normal; no masses,  no organomegaly  Lab Results:  Recent Labs  08/23/14 0753 08/24/14 0735  WBC 5.7 5.8  HGB 9.2* 9.0*  HCT 29.1* 29.8*  PLT 261 319   BMET  Recent Labs  08/23/14 0753 08/24/14 0735  NA 139 139  K 4.1 4.0  CL 101 100  CO2 29 30  GLUCOSE 98 109*  BUN <5* <5*  CREATININE 0.75 0.74  CALCIUM 8.4 8.3*   LFT No results for input(s): PROT, ALBUMIN, AST, ALT, ALKPHOS, BILITOT, BILIDIR, IBILI in the last 72 hours. PT/INR No results for input(s): LABPROT, INR in the last 72 hours. Hepatitis Panel No results for input(s): HEPBSAG, HCVAB, HEPAIGM, HEPBIGM in the last 72 hours. C-Diff No results for input(s): CDIFFTOX in the last 72 hours. Fecal Lactopherrin No results for input(s): FECLLACTOFRN in the last 72 hours.  Studies/Results: No results found.  Medications:  Scheduled: . mesalamine  4.8 g Oral Q breakfast  . methylPREDNISolone (SOLU-MEDROL) injection  40 mg Intravenous Q12H  . pantoprazole  20 mg Oral Daily   Continuous: . sodium chloride 150 mL/hr at 08/24/14 1723    Assessment/Plan: 1) Severe UC. 2) Anemia.   She is not critically ill, but she needs further evaluation from a [facility] care center.  The patient is still on the waiting list for [facility], but I will call the GI department today to see if she can be seen this week.  She states that  she can drive over if necessary as an outpatient.  Plan: 1) Continue with Solumedrol and Lialda. 2) Continue with pain control. 3) Await transfer to [facility], for now.   LOS: 7 days   Jordin Dambrosio D 08/25/2014, 8:28 AM

## 2014-08-26 LAB — CBC
HCT: 31.1 % — ABNORMAL LOW (ref 36.0–46.0)
Hemoglobin: 9.3 g/dL — ABNORMAL LOW (ref 12.0–15.0)
MCH: 24.3 pg — ABNORMAL LOW (ref 26.0–34.0)
MCHC: 29.9 g/dL — ABNORMAL LOW (ref 30.0–36.0)
MCV: 81.4 fL (ref 78.0–100.0)
Platelets: 361 10*3/uL (ref 150–400)
RBC: 3.82 MIL/uL — ABNORMAL LOW (ref 3.87–5.11)
RDW: 16.1 % — ABNORMAL HIGH (ref 11.5–15.5)
WBC: 7.7 10*3/uL (ref 4.0–10.5)

## 2014-08-26 LAB — BASIC METABOLIC PANEL
Anion gap: 11 (ref 5–15)
BUN: <5 mg/dL — ABNORMAL LOW (ref 6–23)
CO2: 33 mmol/L — ABNORMAL HIGH (ref 19–32)
Calcium: 8.8 mg/dL (ref 8.4–10.5)
Chloride: 97 mmol/L (ref 96–112)
Creatinine, Ser: 0.79 mg/dL (ref 0.50–1.10)
GFR calc Af Amer: >90 mL/min (ref >90)
GFR calc non Af Amer: >90 mL/min (ref >90)
Glucose, Bld: 113 mg/dL — ABNORMAL HIGH (ref 70–99)
Potassium: 4.4 mmol/L (ref 3.5–5.1)
Sodium: 141 mmol/L (ref 135–145)

## 2014-08-26 MED ORDER — OLANZAPINE-FLUOXETINE HCL 3-25 MG PO CAPS
1.0000 | ORAL_CAPSULE | Freq: Every day | ORAL | Status: DC
  Administered 2014-08-26 – 2014-08-29 (×4): 1 via ORAL
  Filled 2014-08-26 (×4): qty 1

## 2014-08-26 MED ORDER — SODIUM CHLORIDE 0.9 % IJ SOLN: 10.0000 mL | INTRAMUSCULAR | Status: DC | PRN

## 2014-08-26 MED ORDER — HYDROMORPHONE HCL 2 MG PO TABS
4.0000 mg | ORAL_TABLET | ORAL | Status: DC | PRN
  Administered 2014-08-26 – 2014-08-29 (×2): 4 mg via ORAL
  Filled 2014-08-26 (×3): qty 2

## 2014-08-26 MED ORDER — MORPHINE SULFATE ER 30 MG PO TBCR
30.0000 mg | EXTENDED_RELEASE_TABLET | Freq: Two times a day (BID) | ORAL | Status: DC
  Administered 2014-08-26 (×2): 30 mg via ORAL
  Filled 2014-08-26 (×2): qty 1

## 2014-08-26 NOTE — Progress Notes (Signed)
Subjective: Seven bowel movements yesterday.  Some blood.  Still needing pain medication every 2 hours.  Objective: Vital signs in last 24 hours: Temp:  [97.9 F (36.6 C)-99.5 F (37.5 C)] 98.1 F (36.7 C) (03/01 0604) Pulse Rate:  [65-70] 65 (03/01 0604) Resp:  [17-19] 17 (03/01 0604) BP: (127-145)/(71-77) 145/77 mmHg (03/01 0604) SpO2:  [100 %] 100 % (03/01 0604) Last BM Date: 08/25/14  Intake/Output from previous day: 02/29 0701 - 03/01 0700 In: 2259 [P.O.:480; I.V.:1779] Out: -  Intake/Output this shift:    General appearance: alert and no distress GI: soft, non-tender; bowel sounds normal; no masses,  no organomegaly  Lab Results:  Recent Labs  08/24/14 0735 08/25/14 0733 08/26/14 0500  WBC 5.8 6.5 7.7  HGB 9.0* 9.1* 9.3*  HCT 29.8* 30.1* 31.1*  PLT 319 337 361   BMET  Recent Labs  08/24/14 0735 08/25/14 0733 08/26/14 0500  NA 139 138 141  K 4.0 3.9 4.4  CL 100 101 97  CO2 30 26 33*  GLUCOSE 109* 109* 113*  BUN <5* <5* <5*  CREATININE 0.74 0.75 0.79  CALCIUM 8.3* 8.3* 8.8   LFT No results for input(s): PROT, ALBUMIN, AST, ALT, ALKPHOS, BILITOT, BILIDIR, IBILI in the last 72 hours. PT/INR No results for input(s): LABPROT, INR in the last 72 hours. Hepatitis Panel No results for input(s): HEPBSAG, HCVAB, HEPAIGM, HEPBIGM in the last 72 hours. C-Diff No results for input(s): CDIFFTOX in the last 72 hours. Fecal Lactopherrin No results for input(s): FECLLACTOFRN in the last 72 hours.  Studies/Results: No results found.  Medications:  Scheduled: . mesalamine  4.8 g Oral Q breakfast  . methylPREDNISolone (SOLU-MEDROL) injection  40 mg Intravenous Q12H  . morphine  30 mg Oral Q12H  . pantoprazole  20 mg Oral Daily   Continuous: . sodium chloride 150 mL/hr at 08/25/14 1346    Assessment/Plan: 1) Severe UC. 2) Anemia.   The patient is stable and still awaiting transfer to [facility].  I spoke with them yesterday and she is still on the waiting  list.  I will start her on MS Contin to help control her pain.  Plan: 1) MS Contin. 2) Continue with Solumedrol. 3) Await transfer to [facility].  If I do not get a bed in a timely manner, I will look to other facilities such as [facility] or [facility].   LOS: 8 days   Promise Weldin D 08/26/2014, 7:10 AM

## 2014-08-26 NOTE — Progress Notes (Signed)
Peripherally Inserted Central Catheter/Midline Placement  The IV Nurse has discussed with the patient and/or persons authorized to consent for the patient, the purpose of this procedure and the potential benefits and risks involved with this procedure.  The benefits include less needle sticks, lab draws from the catheter and patient may be discharged home with the catheter.  Risks include, but not limited to, infection, bleeding, blood clot (thrombus formation), and puncture of an artery; nerve damage and irregular heat beat.  Alternatives to this procedure were also discussed.  PICC/Midline Placement Documentation        Diana Page 08/26/2014, 4:08 PM

## 2014-08-26 NOTE — Progress Notes (Signed)
Notified Dr. Benson Norway that pt currently has no IV access. IV team has assessed with ultrasound, without success at finding access. Order for PICC placed.

## 2014-08-27 NOTE — Progress Notes (Signed)
Subjective: Seven bowel movements per day and it is intermittently bloody.  Objective: Vital signs in last 24 hours: Temp:  [97.6 F (36.4 C)-98 F (36.7 C)] 97.6 F (36.4 C) (03/02 0541) Pulse Rate:  [62-85] 62 (03/02 0541) Resp:  [16-18] 16 (03/02 0541) BP: (136-141)/(77-78) 139/77 mmHg (03/02 0541) SpO2:  [97 %-99 %] 97 % (03/02 0541) Last BM Date: 08/27/14  Intake/Output from previous day: 03/01 0701 - 03/02 0700 In: 4163 [P.O.:720; I.V.:3443] Out: -  Intake/Output this shift:    General appearance: alert and no distress GI: nontender with pain medications  Lab Results:  Recent Labs  08/25/14 0733 08/26/14 0500  WBC 6.5 7.7  HGB 9.1* 9.3*  HCT 30.1* 31.1*  PLT 337 361   BMET  Recent Labs  08/25/14 0733 08/26/14 0500  NA 138 141  K 3.9 4.4  CL 101 97  CO2 26 33*  GLUCOSE 109* 113*  BUN <5* <5*  CREATININE 0.75 0.79  CALCIUM 8.3* 8.8   LFT No results for input(s): PROT, ALBUMIN, AST, ALT, ALKPHOS, BILITOT, BILIDIR, IBILI in the last 72 hours. PT/INR No results for input(s): LABPROT, INR in the last 72 hours. Hepatitis Panel No results for input(s): HEPBSAG, HCVAB, HEPAIGM, HEPBIGM in the last 72 hours. C-Diff No results for input(s): CDIFFTOX in the last 72 hours. Fecal Lactopherrin No results for input(s): FECLLACTOFRN in the last 72 hours.  Studies/Results: No results found.  Medications:  Scheduled: . mesalamine  4.8 g Oral Q breakfast  . methylPREDNISolone (SOLU-MEDROL) injection  40 mg Intravenous Q12H  . OLANZapine-FLUoxetine  1 capsule Oral QHS  . pantoprazole  20 mg Oral Daily   Continuous: . sodium chloride 150 mL/hr at 08/27/14 0550    Assessment/Plan: 1) Severe UC.   2) Anemia.   The patient is stable.  She feels that the MS Contin makes her too sleepy.  She is not sure if Doristine Johns is helping with her situation, but she appears to have returned close to her baseline.  I am told that she is #5 on the transfer list.  She wants  to wait one more day to see if she can be transferred over.  Otherwise she feels that she can be managed at home and I will call to see if she can have an appointment sooner at [facility].  Plan: 1) D/C MS Contin. 2) Continue with pain control. 3) Continue with Solumedrol and mesalamine.  4) Await transfer to [facility].  LOS: 9 days   Chaylee Ehrsam D 08/27/2014, 8:03 AM

## 2014-08-28 LAB — BASIC METABOLIC PANEL
Anion gap: 8 (ref 5–15)
BUN: 6 mg/dL (ref 6–23)
CO2: 31 mmol/L (ref 19–32)
Calcium: 8.5 mg/dL (ref 8.4–10.5)
Chloride: 98 mmol/L (ref 96–112)
Creatinine, Ser: 0.8 mg/dL (ref 0.50–1.10)
GFR calc Af Amer: >90 mL/min (ref >90)
GFR calc non Af Amer: >90 mL/min (ref >90)
Glucose, Bld: 124 mg/dL — ABNORMAL HIGH (ref 70–99)
Potassium: 4 mmol/L (ref 3.5–5.1)
Sodium: 137 mmol/L (ref 135–145)

## 2014-08-28 LAB — CBC
HCT: 30.8 % — ABNORMAL LOW (ref 36.0–46.0)
Hemoglobin: 9.2 g/dL — ABNORMAL LOW (ref 12.0–15.0)
MCH: 24.4 pg — ABNORMAL LOW (ref 26.0–34.0)
MCHC: 29.9 g/dL — ABNORMAL LOW (ref 30.0–36.0)
MCV: 81.7 fL (ref 78.0–100.0)
Platelets: 390 10*3/uL (ref 150–400)
RBC: 3.77 MIL/uL — ABNORMAL LOW (ref 3.87–5.11)
RDW: 16.2 % — ABNORMAL HIGH (ref 11.5–15.5)
WBC: 8 10*3/uL (ref 4.0–10.5)

## 2014-08-28 MED ORDER — MORPHINE SULFATE 4 MG/ML IJ SOLN
4.0000 mg | Freq: Once | INTRAMUSCULAR | Status: AC
  Administered 2014-08-28: 4 mg via INTRAVENOUS

## 2014-08-28 NOTE — Progress Notes (Signed)
Pt calling for pain medicine more frequently, C/o worsening abdominal pain.  Wants Morphine but not due yet, md on call notified and new order received.

## 2014-08-28 NOTE — Progress Notes (Signed)
Subjective: She had an increase in her pain last evening associated with diarrhea.  Some blood with the bowel movements.  Objective: Vital signs in last 24 hours: Temp:  [98.2 F (36.8 C)-98.3 F (36.8 C)] 98.2 F (36.8 C) (03/03 0602) Pulse Rate:  [69-79] 70 (03/03 0602) Resp:  [16-18] 16 (03/03 0602) BP: (135-147)/(77-87) 145/77 mmHg (03/03 0602) SpO2:  [95 %-98 %] 98 % (03/03 0602) Last BM Date: 08/27/14  Intake/Output from previous day: 03/02 0701 - 03/03 0700 In: 2160 [P.O.:960; I.V.:1200] Out: -  Intake/Output this shift:    General appearance: alert and no distress GI: generalized tenderness, no toxic megacolon  Lab Results:  Recent Labs  08/26/14 0500 08/28/14 0500  WBC 7.7 8.0  HGB 9.3* 9.2*  HCT 31.1* 30.8*  PLT 361 390   BMET  Recent Labs  08/26/14 0500 08/28/14 0500  NA 141 137  K 4.4 4.0  CL 97 98  CO2 33* 31  GLUCOSE 113* 124*  BUN <5* 6  CREATININE 0.79 0.80  CALCIUM 8.8 8.5   LFT No results for input(s): PROT, ALBUMIN, AST, ALT, ALKPHOS, BILITOT, BILIDIR, IBILI in the last 72 hours. PT/INR No results for input(s): LABPROT, INR in the last 72 hours. Hepatitis Panel No results for input(s): HEPBSAG, HCVAB, HEPAIGM, HEPBIGM in the last 72 hours. C-Diff No results for input(s): CDIFFTOX in the last 72 hours. Fecal Lactopherrin No results for input(s): FECLLACTOFRN in the last 72 hours.  Studies/Results: No results found.  Medications:  Scheduled: . mesalamine  4.8 g Oral Q breakfast  . methylPREDNISolone (SOLU-MEDROL) injection  40 mg Intravenous Q12H  . OLANZapine-FLUoxetine  1 capsule Oral QHS  . pantoprazole  20 mg Oral Daily   Continuous: . sodium chloride 150 mL/hr at 08/27/14 2300    Assessment/Plan: 1) Severe UC. 2) Anemia - Stable.   The patient is still awaiting transfer.  Her pain is now under better control at this time.  She remains stable.  Plan: 1) Continue to await transfer. 2) Continue with Solumedrol and  Lialda. 3) Continue with pain control.   LOS: 10 days   Tonni Mansour D 08/28/2014, 7:35 AM

## 2014-08-29 NOTE — Progress Notes (Signed)
Subjective: Feeling all right today.  Yesterday was not as bad.  Objective: Vital signs in last 24 hours: Temp:  [98.3 F (36.8 C)-98.9 F (37.2 C)] 98.3 F (36.8 C) (03/04 0521) Pulse Rate:  [62-74] 62 (03/04 0521) Resp:  [16-17] 16 (03/04 0521) BP: (142-144)/(81-87) 142/87 mmHg (03/04 0521) SpO2:  [96 %-98 %] 98 % (03/04 0521) Last BM Date: 08/28/14  Intake/Output from previous day: 03/03 0701 - 03/04 0700 In: 120 [P.O.:120] Out: -  Intake/Output this shift:    General appearance: alert and no distress GI: soft, non-tender; bowel sounds normal; no masses,  no organomegaly  Lab Results:  Recent Labs  08/28/14 0500  WBC 8.0  HGB 9.2*  HCT 30.8*  PLT 390   BMET  Recent Labs  08/28/14 0500  NA 137  K 4.0  CL 98  CO2 31  GLUCOSE 124*  BUN 6  CREATININE 0.80  CALCIUM 8.5   LFT No results for input(s): PROT, ALBUMIN, AST, ALT, ALKPHOS, BILITOT, BILIDIR, IBILI in the last 72 hours. PT/INR No results for input(s): LABPROT, INR in the last 72 hours. Hepatitis Panel No results for input(s): HEPBSAG, HCVAB, HEPAIGM, HEPBIGM in the last 72 hours. C-Diff No results for input(s): CDIFFTOX in the last 72 hours. Fecal Lactopherrin No results for input(s): FECLLACTOFRN in the last 72 hours.  Studies/Results: No results found.  Medications:  Scheduled: . mesalamine  4.8 g Oral Q breakfast  . methylPREDNISolone (SOLU-MEDROL) injection  40 mg Intravenous Q12H  . OLANZapine-FLUoxetine  1 capsule Oral QHS  . pantoprazole  20 mg Oral Daily   Continuous: . sodium chloride 150 mL/hr at 08/29/14 0503    Assessment/Plan: 1) Severe UC. 2) Anemia.   She still wants to await a transfer to [facility], which I think is fine.  Plan: 1) Continue with Solumedrol and pain control.    LOS: 11 days   Aland Chestnutt D 08/29/2014, 9:32 AM

## 2014-08-30 NOTE — Discharge Summary (Signed)
Physician Discharge Summary  Patient ID: Diana Page MRN: [id number] DOB/AGE: 02-04-1991 24 y.o.  Admit date: 08/18/2014 Discharge date: 08/30/2014  Admission Diagnoses:  1) Severe Ulcerative Colitis         2) Anemia.  Discharge Diagnoses: Same Active Problems:   Ulcerative colitis   Discharged Condition: Stable  Hospital Course: On the day of admission the patient was dehydrated and classified as severe Ulcerative Colitis.  She had 15-20 bloody bowel movements per day.  Aggressive IV hydration was started at 200 ml/hour and Solumedrol 40 mg q12 hours.  Over the first hospital day there was no significant change, but by hospital day #2 she noticed a decrease in her bowel movements as well as a subjective decline in her bleeding.  With the IV hydration her HGB dropped down to a nadir of 7.5 g/dL and she was transfused 2 units of PRBC.  Her HGB increased into the 10 range.  Even though her bowel movements declined down to 7 per day, clinically on Day #3, she did not appear well.  She was not toxic, but it was clear that she was not going to have a response with Solumedrol by day #5.  A transfer request was made to [facility] and she was accepted, unfortunately, no beds were immediately available.  She was placed on the waiting list.  During this hospitalization she was on q2 hour morphine to help control her abdominal pain.  At no time was there any evidence that the morphine was masking any worsening of her clinical status.  MS Contin was tried to decrease the frequency, but she felt that it made her too drowsy.  There were plans to perform an FFS to assess her mucosa, but that procedure was not performed as it was thought she would be transferred to [facility] relatively soon.  At one point her bowel movements did increase into the 10 range as well as a worsening of her pain one evening, but overall, she remained relatively stable.  The hope with a transfer to [facility] was to determine if there was any other  medical option versus surgical intervention at a specialized center such as [facility].  Additionally her CRP was elevated in the 7 range and she was C. Diff negative.  Consults: None  Significant Diagnostic Studies: labs: Routine blood work.  Treatments: IV hydration, analgesia: Morphine, Solumedrol, and Lialda  Discharge Exam: Blood pressure 118/77, pulse 73, temperature 99.5 F (37.5 C), temperature source Oral, resp. rate 17, height 5' 2"  (1.575 m), weight 92.08 kg (203 lb), SpO2 99 %. General appearance: alert and no distress Resp: clear to auscultation bilaterally Cardio: regular rate and rhythm, S1, S2 normal, no murmur, click, rub or gallop GI: diffusely tender when not using pain medications Extremities: extremities normal, atraumatic, no cyanosis or edema  Disposition: 95-DC/txfr to another health care institution with planned acute care hosp IP readmit     Medication List    ASK your doctor about these medications        ALPRAZolam 0.5 MG tablet  Commonly known as:  XANAX  Take 0.5 mg by mouth every 8 (eight) hours as needed for anxiety.     Norgestimate-Ethinyl Estradiol Triphasic 0.18/0.215/0.25 MG-25 MCG tab  Take 1 tablet by mouth daily.     OLANZapine-FLUoxetine 3-25 MG per capsule  Commonly known as:  SYMBYAX  Take 1 capsule by mouth every evening.     oxyCODONE 5 MG immediate release tablet  Commonly known as:  Oxy IR/ROXICODONE  Take 1 tablet (5 mg total) by mouth every 6 (six) hours as needed for severe pain.     pantoprazole 20 MG tablet  Commonly known as:  PROTONIX  Take 20 mg by mouth daily.     predniSONE 20 MG tablet  Commonly known as:  DELTASONE  Take 2 tablets (40 mg total) by mouth daily with breakfast.         Signed: Devory Mckinzie D 08/30/2014, 8:00 AM

## 2014-08-30 NOTE — Progress Notes (Signed)
Received call from [facility]  with bed available for pt room 3307. Md on call Dr Erskine Emery notified and ordered to transfer pt tonight.  Report given to [employer], [facility] called for transportation. Pt transferred at [facility] in stable condition.

## 2014-09-03 ENCOUNTER — Inpatient Hospital Stay (HOSPITAL_COMMUNITY): Admission: RE | Admit: 2014-09-03 | Payer: 59 | Source: Ambulatory Visit

## 2014-10-31 ENCOUNTER — Ambulatory Visit (HOSPITAL_COMMUNITY): Payer: 59 | Admitting: Psychiatry

## 2014-12-05 ENCOUNTER — Encounter (HOSPITAL_COMMUNITY): Payer: Self-pay | Admitting: Psychiatry

## 2014-12-05 ENCOUNTER — Ambulatory Visit (INDEPENDENT_AMBULATORY_CARE_PROVIDER_SITE_OTHER): Payer: PRIVATE HEALTH INSURANCE | Admitting: Psychiatry

## 2014-12-05 ENCOUNTER — Encounter (INDEPENDENT_AMBULATORY_CARE_PROVIDER_SITE_OTHER): Payer: Self-pay

## 2014-12-05 VITALS — BP 122/84 | HR 89 | Ht 61.75 in | Wt 210.0 lb

## 2014-12-05 DIAGNOSIS — Z79899 Other long term (current) drug therapy: Secondary | ICD-10-CM

## 2014-12-05 DIAGNOSIS — F42 Obsessive-compulsive disorder: Secondary | ICD-10-CM

## 2014-12-05 DIAGNOSIS — F330 Major depressive disorder, recurrent, mild: Secondary | ICD-10-CM | POA: Diagnosis not present

## 2014-12-05 DIAGNOSIS — F429 Obsessive-compulsive disorder, unspecified: Secondary | ICD-10-CM | POA: Insufficient documentation

## 2014-12-05 MED ORDER — OLANZAPINE-FLUOXETINE HCL 3-25 MG PO CAPS: 1.0000 | ORAL_CAPSULE | Freq: Every evening | ORAL | Status: DC

## 2014-12-05 NOTE — Progress Notes (Addendum)
[facility]al Health Initial Assessment Note  Diana Page [id number] 24 y.o.  12/05/2014 10:02 AM  Chief Complaint:  I need a new provider.  I have OCD.  I take Symbax.  History of Present Illness:  Patient is 24 year old Caucasian single, unemployed female who came for her initial appointment to establish care in this office.  Patient has history of OCD and she's been getting treatment in [place] the past 2 years.  She is taking Symbyax 3/25 mg for more than one year with working very well for her.  Patient moved to [place] in December with her boyfriend whose family lives in this area.  Patient reported she has anxiety and nervousness most of her life however 2 years ago she started to have these symptoms get worse.  She started to have intrusive obsessive thoughts that cause interfering in her driving and daily living.  She remember these thoughts are so intense and vivid that she could not function and decided to get help.  She recall while driving if she ever bump something she felt that she killed someone and if she has a minor sickness or any other family member's sick she anticipate the worst and the cycle goes on.  She admitted these anxiety cause social isolation, withdrawn, Susy Frizzle depressed and excessive worrying about herself.  Though she denies any hallucination but admitted some time intrusive thoughts so intense that she does not want to talk to anyone.  Patient has chronic inflammatory bowel disease and she has struggled with this illness most of her life and in March she require ileostomy and some of her symptoms are subsided.  She has ileostomy bag and she feels less depressed because she is able to go out and enjoy her life.  She admitted during the surgery and after the surgery she had severe depression when she felt very withdrawn, hopeless, helpless but now she is getting better.  She has a therapist in [place] who she talk regularly. Patient does not want to change  her therapist.  Patient reported her current medication is working very well and she does not want to change it.  She described her mood is improved since she has the surgery in March.  Patient denies any agoraphobia, hopelessness, suicidal thoughts, hallucination or any paranoia.  She admitted if she does not take the medication she gets very anxious, having panic-like symptoms, obsessive thoughts and crying spells.  She admitted most of the time she has obsessive thoughts and no compulsion but her thoughts are very intrusive and intense.  Patient denies drinking or using any illegal substances.  Her sleep is good.  Her energy level is good.  She denies any mood swing, anger, irritability or any grandiosity.  Patient denies any history of sexual, verbal and emotional abuse.  She has no nightmares or flashback.  Currently she is unemployed but looking for a job.  She had a good relationship with her boyfriend who is very supportive.  Patient also very close to her mother and her siblings who lives in [place].  Her appetite is okay.  Her vitals are stable.  Suicidal Ideation: No Plan Formed: No Patient has means to carry out plan: No  Homicidal Ideation: No Plan Formed: No Patient has means to carry out plan: No  Past Psychiatric History/Hospitalization(s): Patient endorse history of anxiety most of her life however in past 2 years symptoms started to get worse.  She started counseling and diagnosed with OCD.  Initially  she tried Prozac low dose but she did not like it and then her psychiatrist Dr. Leilani Merl in [place] started her on Symbyax.  Patient had a good response with this medication.  Patient denies any hallucination, paranoia, mood swing, agitation or any anger.  She denies any history of suicidal attempt or any inpatient psychiatric treatment. Anxiety: Yes Bipolar Disorder: No Depression: Yes Mania: No Psychosis: No Schizophrenia: No Personality Disorder: No Hospitalization for  psychiatric illness: No History of Electroconvulsive Shock Therapy: No Prior Suicide Attempts: No  Medical History; Patient has chronic inflammatory bowel disease.  In March 2016 she has ileostomy and she has ileostomy bag.  She also has history of anemia, pancreatitis and history of cholecystectomy.  She see Dr. Saralyn Pilar hung for her GI issues.  She is looking for private care physician.  Patient denies any history of seizures.  Traumatic brain injury: Patient denies any history of traumatic brain injury.  Family History; Patient endorse mother, sister and brother has OCD and anxiety symptoms.  Education and Work History; Patient has bachelor's in [place] from [employer].  Currently she is unemployed but looking for a job.  Psychosocial History; Patient born and raised in [place].  She's never married and she has no children.  She has a very good supportive relationship with her boyfriend for more than 5 years.  In December 2015 she moved with her boyfriend to [place] because her boyfriend's family live in [place].  Patient has one brother and sister who lives in [place].  Patient is youngest.  She has a good relationship with her parents.  Legal History; Patient denies any legal issues.  History Of Abuse; Patient denies any history of abuse.  Substance Abuse History; Patient denies any history of drinking or any illegal substance use.  Review of Systems  Constitutional: Negative for weight loss.  Gastrointestinal: Positive for nausea.  Skin: Negative for itching and rash.  Neurological: Negative for dizziness, tremors and headaches.  Psychiatric/Behavioral: Negative for depression, suicidal ideas, hallucinations and substance abuse.    Psychiatric: Agitation: No Hallucination: No Depressed Mood: No Insomnia: No Hypersomnia: No Altered Concentration: No Feels Worthless: No Grandiose Ideas: No Belief In Special Powers:  No New/Increased Substance Abuse: No Compulsions: Obsessive thoughts while she drives that she may hit the person  Neurologic: Headache: No Seizure: No Paresthesias: No   Outpatient Encounter Prescriptions as of 12/05/2014  Medication Sig  . ALPRAZolam (XANAX) 0.5 MG tablet Take 0.5 mg by mouth every 8 (eight) hours as needed for anxiety.  Marland Kitchen OLANZapine-FLUoxetine (SYMBYAX) 3-25 MG per capsule Take 1 capsule by mouth every evening.  Marland Kitchen oxyCODONE (OXY IR/ROXICODONE) 5 MG immediate release tablet Take 1 tablet (5 mg total) by mouth every 6 (six) hours as needed for severe pain.  . [DISCONTINUED] Norgestimate-Ethinyl Estradiol Triphasic 0.18/0.215/0.25 MG-25 MCG tab Take 1 tablet by mouth daily.  . [DISCONTINUED] OLANZapine-FLUoxetine (SYMBYAX) 3-25 MG per capsule Take 1 capsule by mouth every evening.  . [DISCONTINUED] pantoprazole (PROTONIX) 20 MG tablet Take 20 mg by mouth daily.  . [DISCONTINUED] predniSONE (DELTASONE) 20 MG tablet Take 2 tablets (40 mg total) by mouth daily with breakfast.   No facility-administered encounter medications on file as of 12/05/2014.    No results found for this or any previous visit (from the past 2160 hour(s)).    Constitutional:  BP 122/84 mmHg  Pulse 89  Ht 5' 1.75" (1.568 m)  Wt 210 lb (95.255 kg)  BMI  38.74 kg/m2  LMP 11/27/2014   Musculoskeletal: Strength & Muscle Tone: within normal limits Gait & Station: normal Patient leans: N/A  Psychiatric Specialty Exam: General Appearance: Casual  Eye Contact::  Good  Speech:  Slow  Volume:  Normal  Mood:  Anxious  Affect:  Congruent  Thought Process:  Coherent  Orientation:  Full (Time, Place, and Person)  Thought Content:  Obsessions and Rumination  Suicidal Thoughts:  No  Homicidal Thoughts:  No  Memory:  Immediate;   Good Recent;   Good Remote;   Good  Judgement:  Good  Insight:  Good  Psychomotor Activity:  Normal  Concentration:  Fair  Recall:  [place] of Knowledge:  Good   Language:  Good  Akathisia:  No  Handed:  Right  AIMS (if indicated):     Assets:  Communication Skills Desire for Improvement Financial Resources/Insurance Housing Social Support Transportation  ADL's:  Intact  Cognition:  WNL  Sleep:        Established Problem, Stable/Improving (1), New problem, with additional work up planned, Review of Psycho-Social Stressors (1), Review or order clinical lab tests (1), Decision to obtain old records (1), Review and summation of old records (2), New Problem, with no additional work-up planned (3), Independent Review of image, tracing or specimen (2) and Review of New Medication or Change in Dosage (2)  Assessment: Axis I: Obsessive-compulsive disorder.  Major depressive disorder recurrent mild  Axis II: Deferred  Axis III:  Past Medical History  Diagnosis Date  . UC (ulcerative colitis)   . Pancreatitis   . Pancreatitis     hx of   . Obsessive compulsive disorder 06/14     Plan:  I review her symptoms, history, current medication and collateral history.  Patient is fairly stable on her current medication.  She is taking Symbyax 3/25 mg every night.  She does not want to change her dosage or medication.  We will get records from her previous psychiatrist Dr. Leilani Merl from [place].  She wants to continue her current therapist Agapito Games for counseling.  I discussed medication side effects and benefits.  At this time he does not have any tremors or shakes.  I will do blood work include CBC, CMP, hemoglobin A1c and TSH.  We talked about metabolic syndrome, sedation with the psycho tropic medication.  Patient feels comfortable on her current dose.  Discuss sleep hygiene and weight reduction and maintenance.  Encouraged to watch her diet and doing her exercise.  Discuss safety plan that anytime having active suicidal thoughts or homicidal thoughts and she need to call 911 or go to the local emergency room.  Time spent 55 minutes.  More than 50% of  the time spent in psychoeducation, counseling and coordination of care.  An of time was given to ask questions regarding medication, diagnosis and prognosis.  I will see her again in 4 weeks.  ARFEEN,SYED T., MD 12/05/2014

## 2014-12-15 ENCOUNTER — Other Ambulatory Visit: Payer: Self-pay | Admitting: Ophthalmology

## 2014-12-16 ENCOUNTER — Other Ambulatory Visit: Payer: Self-pay | Admitting: Ophthalmology

## 2014-12-16 DIAGNOSIS — H4710 Unspecified papilledema: Secondary | ICD-10-CM

## 2014-12-26 ENCOUNTER — Encounter (HOSPITAL_COMMUNITY): Payer: Self-pay | Admitting: *Deleted

## 2014-12-26 ENCOUNTER — Emergency Department (HOSPITAL_COMMUNITY)
Admission: EM | Admit: 2014-12-26 | Discharge: 2014-12-27 | Disposition: A | Discharge status: Home / Self Care | Payer: PRIVATE HEALTH INSURANCE | Source: Home / Self Care | Attending: Emergency Medicine | Admitting: Emergency Medicine

## 2014-12-26 DIAGNOSIS — Z8719 Personal history of other diseases of the digestive system: Secondary | ICD-10-CM | POA: Insufficient documentation

## 2014-12-26 DIAGNOSIS — Z8659 Personal history of other mental and behavioral disorders: Secondary | ICD-10-CM

## 2014-12-26 DIAGNOSIS — Z3202 Encounter for pregnancy test, result negative: Secondary | ICD-10-CM | POA: Insufficient documentation

## 2014-12-26 DIAGNOSIS — Z87891 Personal history of nicotine dependence: Secondary | ICD-10-CM | POA: Insufficient documentation

## 2014-12-26 DIAGNOSIS — H538 Other visual disturbances: Secondary | ICD-10-CM

## 2014-12-26 DIAGNOSIS — Z932 Ileostomy status: Secondary | ICD-10-CM

## 2014-12-26 DIAGNOSIS — H53131 Sudden visual loss, right eye: Secondary | ICD-10-CM

## 2014-12-26 DIAGNOSIS — F42 Obsessive-compulsive disorder: Secondary | ICD-10-CM | POA: Diagnosis present

## 2014-12-26 DIAGNOSIS — R51 Headache: Secondary | ICD-10-CM

## 2014-12-26 DIAGNOSIS — R42 Dizziness and giddiness: Secondary | ICD-10-CM | POA: Diagnosis not present

## 2014-12-26 DIAGNOSIS — K5190 Ulcerative colitis, unspecified, without complications: Secondary | ICD-10-CM | POA: Diagnosis present

## 2014-12-26 LAB — CBC WITH DIFFERENTIAL/PLATELET
Basophils Absolute: 0 10*3/uL (ref 0.0–0.1)
Basophils Relative: 0 % (ref 0–1)
Eosinophils Absolute: 0.2 10*3/uL (ref 0.0–0.7)
Eosinophils Relative: 2 % (ref 0–5)
HCT: 30.8 % — ABNORMAL LOW (ref 36.0–46.0)
Hemoglobin: 9.6 g/dL — ABNORMAL LOW (ref 12.0–15.0)
Lymphocytes Relative: 33 % (ref 12–46)
Lymphs Abs: 3.1 10*3/uL (ref 0.7–4.0)
MCH: 21.8 pg — ABNORMAL LOW (ref 26.0–34.0)
MCHC: 31.2 g/dL (ref 30.0–36.0)
MCV: 70 fL — ABNORMAL LOW (ref 78.0–100.0)
Monocytes Absolute: 0.5 10*3/uL (ref 0.1–1.0)
Monocytes Relative: 5 % (ref 3–12)
Neutro Abs: 5.6 10*3/uL (ref 1.7–7.7)
Neutrophils Relative %: 60 % (ref 43–77)
Platelets: 386 10*3/uL (ref 150–400)
RBC: 4.4 MIL/uL (ref 3.87–5.11)
RDW: 15.6 % — ABNORMAL HIGH (ref 11.5–15.5)
WBC: 9.4 10*3/uL (ref 4.0–10.5)

## 2014-12-26 LAB — SAMPLE TO BLOOD BANK

## 2014-12-26 NOTE — ED Notes (Signed)
The pt is being worked up for a headache dizziness with peripheral vision blockage.  She is scheduled for a mri tomorrow but became anxious tonight.  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  Rectal bleeding every day  lmp last month

## 2014-12-26 NOTE — ED Provider Notes (Signed)
CSN: [id number]     Arrival date & time 12/26/14  2255 History  This chart was scribed for Delora Fuel, MD by Peyton Bottoms, ED Scribe. This patient was seen in room D33C/D33C and the patient's care was started at 11:30 PM.   Chief Complaint  Patient presents with  . Dizziness   Patient is a 24 y.o. female presenting with dizziness. The history is provided by the patient. No language interpreter was used.  Dizziness Associated symptoms: headaches   Associated symptoms: no nausea    HPI Comments: Diana Page is a 24 y.o. female with a PMHx of ulcerative colitis, and OCD, who presents to the Emergency Department complaining of dizziness when standing up from sitting for about 40 minutes that occurred earlier today. She reports associated loss of peripheral vision in her right eye for about 2 seconds. Pt reports she "sees flashing lights" when this happens. She states that these symptoms have been occurring intermittently for the past 2 weeks. She has been seen by her optometrist regarding these symptoms and is scheduled to have an MRI tomorrow, MRA and spinal tap. She also reports associated intermittent migraine headaches.  She denies associated vision disturbances in her left eye. She denies nausea. She reports she was recently diagnosed to be anemic.   Past Medical History  Diagnosis Date  . UC (ulcerative colitis)   . Pancreatitis   . Pancreatitis     hx of   . Obsessive compulsive disorder 06/14   Past Surgical History  Procedure Laterality Date  . Cholecystectomy     Family History  Problem Relation Age of Onset  . OCD Mother   . OCD Sister   . OCD Brother    History  Substance Use Topics  . Smoking status: Former Smoker    Quit date: 06/27/2009  . Smokeless tobacco: Never Used  . Alcohol Use: No   OB History    No data available     Review of Systems  Eyes: Positive for visual disturbance (right eye).  Gastrointestinal: Negative for nausea.  Neurological:  Positive for dizziness and headaches.   Allergies  Review of patient's allergies indicates no known allergies.  Home Medications   Prior to Admission medications   Medication Sig Start Date End Date Taking? Authorizing Provider  ALPRAZolam (XANAX) 0.25 MG tablet Take 0.25 mg by mouth 3 (three) times daily as needed for anxiety.   Yes Historical Provider, MD  OLANZapine-FLUoxetine (SYMBYAX) 3-25 MG per capsule Take 1 capsule by mouth every evening. 12/05/14  Yes Kathlee Nations, MD  oxyCODONE (OXY IR/ROXICODONE) 5 MG immediate release tablet Take 1 tablet (5 mg total) by mouth every 6 (six) hours as needed for severe pain. 07/20/14  Yes Estela Leonie Green, MD   Triage Vitals: BP 120/68 mmHg  Pulse 108  Temp(Src) 98.3 F (36.8 C) (Oral)  Resp 18  SpO2 99%  LMP 11/27/2014  Physical Exam  Constitutional: She is oriented to person, place, and time. She appears well-developed and well-nourished. No distress.  HENT:  Head: Normocephalic and atraumatic.  Eyes: Conjunctivae and EOM are normal.  Neck: Neck supple. No tracheal deviation present.  Cardiovascular: Normal rate.   Pulmonary/Chest: Effort normal. No respiratory distress.  Musculoskeletal: Normal range of motion.  Neurological: She is alert and oriented to person, place, and time.  Visual fields in tact with confrontation.  Skin: Skin is warm and dry.  Psychiatric: She has a normal mood and affect. Her behavior is normal.  Nursing  note and vitals reviewed.  ED Course  Procedures (including critical care time)  DIAGNOSTIC STUDIES: Oxygen Saturation is 99% on RA, normal by my interpretation.    COORDINATION OF CARE: 11:36 PM Discussed plans to order diagnostic urinalysis and lab work. Pt advised of plan for treatment and pt agrees.  Labs Review Results for orders placed or performed during the hospital encounter of 12/26/14  CBC with Differential  Result Value Ref Range   WBC 9.4 4.0 - 10.5 K/uL   RBC 4.40 3.87 -  5.11 MIL/uL   Hemoglobin 9.6 (L) 12.0 - 15.0 g/dL   HCT 30.8 (L) 36.0 - 46.0 %   MCV 70.0 (L) 78.0 - 100.0 fL   MCH 21.8 (L) 26.0 - 34.0 pg   MCHC 31.2 30.0 - 36.0 g/dL   RDW 15.6 (H) 11.5 - 15.5 %   Platelets 386 150 - 400 K/uL   Neutrophils Relative % 60 43 - 77 %   Neutro Abs 5.6 1.7 - 7.7 K/uL   Lymphocytes Relative 33 12 - 46 %   Lymphs Abs 3.1 0.7 - 4.0 K/uL   Monocytes Relative 5 3 - 12 %   Monocytes Absolute 0.5 0.1 - 1.0 K/uL   Eosinophils Relative 2 0 - 5 %   Eosinophils Absolute 0.2 0.0 - 0.7 K/uL   Basophils Relative 0 0 - 1 %   Basophils Absolute 0.0 0.0 - 0.1 K/uL  Comprehensive metabolic panel  Result Value Ref Range   Sodium 136 135 - 145 mmol/L   Potassium 3.9 3.5 - 5.1 mmol/L   Chloride 103 101 - 111 mmol/L   CO2 24 22 - 32 mmol/L   Glucose, Bld 104 (H) 65 - 99 mg/dL   BUN 11 6 - 20 mg/dL   Creatinine, Ser 0.85 0.44 - 1.00 mg/dL   Calcium 9.0 8.9 - 10.3 mg/dL   Total Protein 7.9 6.5 - 8.1 g/dL   Albumin 3.7 3.5 - 5.0 g/dL   AST 36 15 - 41 U/L   ALT 37 14 - 54 U/L   Alkaline Phosphatase 143 (H) 38 - 126 U/L   Total Bilirubin 0.3 0.3 - 1.2 mg/dL   GFR calc non Af Amer >60 >60 mL/min   GFR calc Af Amer >60 >60 mL/min   Anion gap 9 5 - 15  Urinalysis, Routine w reflex microscopic (not at [facility])  Result Value Ref Range   Color, Urine YELLOW YELLOW   APPearance CLEAR CLEAR   Specific Gravity, Urine 1.006 1.005 - 1.030   pH 6.5 5.0 - 8.0   Glucose, UA NEGATIVE NEGATIVE mg/dL   Hgb urine dipstick NEGATIVE NEGATIVE   Bilirubin Urine NEGATIVE NEGATIVE   Ketones, ur NEGATIVE NEGATIVE mg/dL   Protein, ur NEGATIVE NEGATIVE mg/dL   Urobilinogen, UA 0.2 0.0 - 1.0 mg/dL   Nitrite NEGATIVE NEGATIVE   Leukocytes, UA NEGATIVE NEGATIVE  POC Urine Pregnancy, ED  (If Pre-menopausal female)  not at [facility]  Result Value Ref Range   Preg Test, Ur NEGATIVE NEGATIVE  Sample to Blood Bank  Result Value Ref Range   Blood Bank Specimen SAMPLE AVAILABLE FOR TESTING    Sample  Expiration 12/27/2014    MDM   Final diagnoses:  Acute loss of vision, right    Episodes of orthostatic dizziness with right temporal field of visual loss. She comes with a note from her ophthalmologist who noted some papilledema and has started workup for possible pseudotumor cerebri. She is scheduled for MRI scan  on July 2 at 3 PM. I offered to have MRI done while she is in the ED but she prefers to go home. In the ED, she has stable anemia compared with baseline and no other abnormalities on lab testing. No orthostatic drop in blood pressure or rise in pulse. She is discharged with instructions to continue her workup has has been initiated.  I personally performed the services described in this documentation, which was scribed in my presence. The recorded information has been reviewed and is accurate.     Delora Fuel, MD [id number]

## 2014-12-27 ENCOUNTER — Emergency Department (HOSPITAL_COMMUNITY): Payer: PRIVATE HEALTH INSURANCE

## 2014-12-27 ENCOUNTER — Inpatient Hospital Stay (HOSPITAL_COMMUNITY)
Admission: EM | Admit: 2014-12-27 | Discharge: 2014-12-28 | DRG: 149 | Disposition: A | Discharge status: Home / Self Care | Payer: PRIVATE HEALTH INSURANCE | Attending: Internal Medicine | Admitting: Internal Medicine

## 2014-12-27 ENCOUNTER — Other Ambulatory Visit: Payer: Self-pay

## 2014-12-27 ENCOUNTER — Ambulatory Visit
Admission: RE | Admit: 2014-12-27 | Discharge: 2014-12-27 | Disposition: A | Discharge status: Home / Self Care | Payer: PRIVATE HEALTH INSURANCE | Source: Ambulatory Visit | Attending: Ophthalmology | Admitting: Ophthalmology

## 2014-12-27 ENCOUNTER — Encounter (HOSPITAL_COMMUNITY): Payer: Self-pay | Admitting: *Deleted

## 2014-12-27 DIAGNOSIS — F429 Obsessive-compulsive disorder, unspecified: Secondary | ICD-10-CM | POA: Diagnosis present

## 2014-12-27 DIAGNOSIS — R0602 Shortness of breath: Secondary | ICD-10-CM

## 2014-12-27 DIAGNOSIS — K5190 Ulcerative colitis, unspecified, without complications: Secondary | ICD-10-CM | POA: Diagnosis present

## 2014-12-27 DIAGNOSIS — H4710 Unspecified papilledema: Secondary | ICD-10-CM

## 2014-12-27 DIAGNOSIS — R42 Dizziness and giddiness: Secondary | ICD-10-CM

## 2014-12-27 LAB — COMPREHENSIVE METABOLIC PANEL
ALT: 37 U/L (ref 14–54)
AST: 36 U/L (ref 15–41)
Albumin: 3.7 g/dL (ref 3.5–5.0)
Alkaline Phosphatase: 143 U/L — ABNORMAL HIGH (ref 38–126)
Anion gap: 9 (ref 5–15)
BUN: 11 mg/dL (ref 6–20)
CO2: 24 mmol/L (ref 22–32)
Calcium: 9 mg/dL (ref 8.9–10.3)
Chloride: 103 mmol/L (ref 101–111)
Creatinine, Ser: 0.85 mg/dL (ref 0.44–1.00)
GFR calc Af Amer: >60 mL/min (ref >60)
GFR calc non Af Amer: >60 mL/min (ref >60)
Glucose, Bld: 104 mg/dL — ABNORMAL HIGH (ref 65–99)
Potassium: 3.9 mmol/L (ref 3.5–5.1)
Sodium: 136 mmol/L (ref 135–145)
Total Bilirubin: 0.3 mg/dL (ref 0.3–1.2)
Total Protein: 7.9 g/dL (ref 6.5–8.1)

## 2014-12-27 LAB — CSF CELL COUNT WITH DIFFERENTIAL
RBC Count, CSF: 1380 /mm3 — ABNORMAL HIGH
Tube #: 1
WBC, CSF: 1 /mm3 (ref 0–5)

## 2014-12-27 LAB — URINALYSIS, ROUTINE W REFLEX MICROSCOPIC
Bilirubin Urine: NEGATIVE
Glucose, UA: NEGATIVE mg/dL
Hgb urine dipstick: NEGATIVE
Ketones, ur: NEGATIVE mg/dL
Leukocytes, UA: NEGATIVE
Nitrite: NEGATIVE
Protein, ur: NEGATIVE mg/dL
Specific Gravity, Urine: 1.006 (ref 1.005–1.030)
Urobilinogen, UA: 0.2 mg/dL (ref 0.0–1.0)
pH: 6.5 (ref 5.0–8.0)

## 2014-12-27 LAB — CBC
HCT: 30.8 % — ABNORMAL LOW (ref 36.0–46.0)
Hemoglobin: 9.3 g/dL — ABNORMAL LOW (ref 12.0–15.0)
MCH: 21 pg — ABNORMAL LOW (ref 26.0–34.0)
MCHC: 30.2 g/dL (ref 30.0–36.0)
MCV: 69.7 fL — ABNORMAL LOW (ref 78.0–100.0)
Platelets: 387 10*3/uL (ref 150–400)
RBC: 4.42 MIL/uL (ref 3.87–5.11)
RDW: 15.5 % (ref 11.5–15.5)
WBC: 7.5 10*3/uL (ref 4.0–10.5)

## 2014-12-27 LAB — BASIC METABOLIC PANEL
Anion gap: 7 (ref 5–15)
BUN: 9 mg/dL (ref 6–20)
CO2: 25 mmol/L (ref 22–32)
Calcium: 9.1 mg/dL (ref 8.9–10.3)
Chloride: 107 mmol/L (ref 101–111)
Creatinine, Ser: 0.86 mg/dL (ref 0.44–1.00)
GFR calc Af Amer: >60 mL/min (ref >60)
GFR calc non Af Amer: >60 mL/min (ref >60)
Glucose, Bld: 106 mg/dL — ABNORMAL HIGH (ref 65–99)
Potassium: 3.5 mmol/L (ref 3.5–5.1)
Sodium: 139 mmol/L (ref 135–145)

## 2014-12-27 LAB — PROTEIN AND GLUCOSE, CSF
Glucose, CSF: 57 mg/dL (ref 40–70)
Total  Protein, CSF: 25 mg/dL (ref 15–45)

## 2014-12-27 LAB — POC URINE PREG, ED: Preg Test, Ur: NEGATIVE

## 2014-12-27 MED ORDER — LORAZEPAM 2 MG/ML IJ SOLN
1.0000 mg | Freq: Once | INTRAMUSCULAR | Status: AC
  Administered 2014-12-27: 1 mg via INTRAVENOUS
  Filled 2014-12-27: qty 1

## 2014-12-27 MED ORDER — OXYCODONE-ACETAMINOPHEN 5-325 MG PO TABS: 1.0000 | ORAL_TABLET | Freq: Once | ORAL | Status: DC

## 2014-12-27 NOTE — ED Notes (Signed)
Pt sent here by radiologist after outpatient MRI of the brain showed some type of infection (he feels it is viral herpes).  Pt was seen here yesterday for dizziness and peripheral R eye brightness.  Radiologist feels pt may need LP.

## 2014-12-27 NOTE — Discharge Instructions (Signed)
Go for your MRI scan as scheduled.

## 2014-12-27 NOTE — ED Notes (Signed)
Dr. Linker at bedside  

## 2014-12-27 NOTE — ED Notes (Signed)
Consent for Procedure signed and at bedside.

## 2014-12-27 NOTE — ED Notes (Signed)
LP attempted at bedside by Dr. Canary Brim unsuccessfully. Radiology called to have LP done in radiology.

## 2014-12-27 NOTE — ED Provider Notes (Signed)
CSN: [id number]     Arrival date & time 12/27/14  1655 History   First MD Initiated Contact with Patient 12/27/14 1728     Chief Complaint  Patient presents with  . Dizziness     (Consider location/radiation/quality/duration/timing/severity/associated sxs/prior Treatment) HPI  Pt presenting with c/o intermittent episodes of dizziness upon standing and intermittent loss of vision in her right eye (peripheral vision)- she states the symptoms have been coming and going for the past 2 weeks.  She saw her eye doctor who ordered MRI, MRA and LP to evaluate for pseudotumor.  She had the MRI earlier today and was called afterwards by the radiologist advising her to come to the ED for lumbar puncture.  The MRI results were concerning for possible herpes enchephalitis.  No fever, no headache.  Pt states she feels well on arrival, however is very nervous about her possible diagnosis and is requesting ativan.  She currently has no vision sympotms, no focal weakness or numbness. No seizure activity.  There are no other associated systemic symptoms, there are no other alleviating or modifying factors.   Past Medical History  Diagnosis Date  . UC (ulcerative colitis)   . Pancreatitis   . Pancreatitis     hx of   . Obsessive compulsive disorder 06/14   Past Surgical History  Procedure Laterality Date  . Cholecystectomy    . Permanent ileostomy     Family History  Problem Relation Age of Onset  . OCD Mother   . OCD Sister   . OCD Brother    History  Substance Use Topics  . Smoking status: Former Smoker    Quit date: 06/27/2009  . Smokeless tobacco: Never Used  . Alcohol Use: No   OB History    No data available     Review of Systems  ROS reviewed and all otherwise negative except for mentioned in HPI    Allergies  Review of patient's allergies indicates no known allergies.  Home Medications   Prior to Admission medications   Medication Sig Start Date End Date Taking? Authorizing  Provider  ALPRAZolam Duanne Moron) 0.25 MG tablet Take 0.25 mg by mouth daily as needed for anxiety.    Yes Historical Provider, MD  Multiple Vitamin (MULTIVITAMIN WITH MINERALS) TABS tablet Take 1 tablet by mouth daily at 12 noon.   Yes Historical Provider, MD  OLANZapine-FLUoxetine (SYMBYAX) 3-25 MG per capsule Take 1 capsule by mouth every evening. Patient taking differently: Take 1 capsule by mouth at bedtime.  12/05/14  Yes Kathlee Nations, MD  oxyCODONE (OXY IR/ROXICODONE) 5 MG immediate release tablet Take 1 tablet (5 mg total) by mouth every 6 (six) hours as needed for severe pain. Patient taking differently: Take 5 mg by mouth 2 (two) times daily as needed for severe pain (pain).  07/20/14  Yes Erline Hau, MD   BP 109/61 mmHg  Pulse 95  Temp(Src) 99.1 F (37.3 C) (Oral)  Resp 16  Ht 5' 2"  (1.575 m)  Wt 208 lb (94.348 kg)  BMI 38.03 kg/m2  SpO2 100%  LMP 11/27/2014  Vitals reviewed Physical Exam  Physical Examination: General appearance - alert, well appearing, and in no distress Mental status - alert, oriented to person, place, and time Eyes - pupils equal and reactive, extraocular eye movements intact Mouth - mucous membranes moist, pharynx normal without lesions Chest - clear to auscultation, no wheezes, rales or rhonchi, symmetric air entry Heart - normal rate, regular rhythm, normal S1, S2,  no murmurs, rubs, clicks or gallops Abdomen - soft, nontender, nondistended, no masses or organomegaly, colostomy bag in abdomen Neurological - alert, oriented x 3, cranial nerves grossly intact, strength 5/5 in extremities x 4, sensation intact Extremities - peripheral pulses normal, no pedal edema, no clubbing or cyanosis Skin - normal coloration and turgor, no rashes  ED Course  LUMBAR PUNCTURE Date/Time: 12/27/2014 7:28 PM Performed by: Alfonzo Beers Authorized by: Alfonzo Beers Consent: Verbal consent obtained. Risks and benefits: risks, benefits and alternatives were  discussed Consent given by: patient Patient understanding: patient states understanding of the procedure being performed Patient consent: the patient's understanding of the procedure matches consent given Site marked: the operative site was marked Imaging studies: imaging studies available Patient identity confirmed: verbally with patient and arm band Time out: Immediately prior to procedure a "time out" was called to verify the correct patient, procedure, equipment, support staff and site/side marked as required. Indications: evaluation for infection Anesthesia: local infiltration Local anesthetic: lidocaine 1% with epinephrine Anesthetic total: 5 ml Patient sedated: no Preparation: Patient was prepped and draped in the usual sterile fashion. Lumbar space: L4-L5 interspace Patient's position: left lateral decubitus Needle gauge: 20 Needle type: spinal needle - Quincke tip Number of attempts: 2 Post-procedure: site cleaned and adhesive bandage applied Patient tolerance: Patient tolerated the procedure well with no immediate complications Comments: Not successful in obtaining CSF   (including critical care time)   Labs Review Labs Reviewed  CBC - Abnormal; Notable for the following:    Hemoglobin 9.3 (*)    HCT 30.8 (*)    MCV 69.7 (*)    MCH 21.0 (*)    All other components within normal limits  BASIC METABOLIC PANEL - Abnormal; Notable for the following:    Glucose, Bld 106 (*)    All other components within normal limits  CSF CELL COUNT WITH DIFFERENTIAL - Abnormal; Notable for the following:    Color, CSF PINK (*)    RBC Count, CSF 1380 (*)    All other components within normal limits  GRAM STAIN  CSF CULTURE  CSF CULTURE  PROTEIN AND GLUCOSE, CSF  GLUCOSE, CSF  PROTEIN, CSF  VDRL, CSF  CSF CELL COUNT WITH DIFFERENTIAL  VDRL, CSF  HERPES SIMPLEX VIRUS(HSV) DNA BY PCR    Imaging Review Mr Brain Wo Contrast  12/27/2014   CLINICAL DATA:  Papilledema. Vision  changes, lightheadedness, and dizziness over the last 2 weeks.  EXAM: MRI HEAD WITHOUT CONTRAST  TECHNIQUE: Multiplanar, multiecho pulse sequences of the brain and surrounding structures were obtained without intravenous contrast.  COMPARISON:  None.  FINDINGS: Abnormal T2 signal is present within the medial temporal lobes bilaterally. This predominantly involves the hippocampal structures. The right hippocampus is swollen compared to the left.  No acute infarct or hemorrhage is present. Diffusion signal changes in the medial temporal with ribs is likely related to T2 shine through.  No significant white matter changes are present. The ventricles are of normal size. No other cortical abnormalities are present.  Flow is present in the major intracranial arteries. The globes and orbits are intact. There may be some edema in the distal right optic nerve.  The dural sinuses are patent. The paranasal sinuses and mastoid air cells are clear. The skullbase is within normal limits. Midline structures are normal.  IMPRESSION: 1. Abnormal signal within the medial temporal lobes bilaterally predominantly involving the hippocampal structures. This is concerning for infectious process including herpes encephalitis. Other viral encephalitides can give  a similar picture. Limbic encephalitis can look like this, but is typically associated with lung cancer. Alternatively, this could be related to seizure activity. The patient has no history of seizures. 2. Question abnormal signal within the anterior aspect of the right optic nerve, potentially related to same process.  Dr. Frederico Hamman is office is not open at this time and will not be open for 3 days over the long weekend. I therefore contacted the patient directly and discussed concerns of possible encephalitis. I recommended that she go to the emergency department for further evaluation. She stated that she would go to the [facility] department for evaluation. At 4:50 p.m., I spoke  with Hassan Rowan, one of the triage nurses who will be expecting her.   Electronically Signed   By: San Morelle M.D.   On: 12/27/2014 16:52   Dg Lumbar Puncture Fluoro Guide  12/27/2014   CLINICAL DATA:  Question of encephalitis on MRI.  Initial encounter.  EXAM: DIAGNOSTIC LUMBAR PUNCTURE UNDER FLUOROSCOPIC GUIDANCE  FLUOROSCOPY TIME:  Fluoroscopy Time (in minutes and seconds): 12 seconds.  Number of Acquired Images:  Single fluoroscopic hold image saved.  PROCEDURE: Informed consent was obtained from the patient prior to the procedure, including potential complications of headache, allergy, and pain. With the patient prone, the lower back was prepped with Betadine. 1% Lidocaine was used for local anesthesia. Lumbar puncture was performed at the L4-L5 level using a 20 gauge needle with return of clear CSF. 8 mL of CSF were obtained for laboratory studies. The patient tolerated the procedure well and there were no apparent complications.  IMPRESSION: Successful lumbar puncture, with return of clear CSF.   Electronically Signed   By: Garald Balding M.D.   On: 12/27/2014 21:05     EKG Interpretation None      MDM   Final diagnoses:  Dizziness    Pt presenting as advised by radiology due to the above MRI results to have LP performed.  LP attempted by me, but unsuccessful- CSF obtained by radiology under fluoro.  Glucose and protein are normal.  Cell count in tube #1 shows RBCs, 1 WBC.  Gram stain pending.   12:14 AM d/w Dr. Doy Mince, neurology, she will see patient and review MRI and LP results.   1:18 AM Dr. Doy Mince requests that I admit patient to the medical service.  She will order workup/meds as she determines based on her consult.    1:40 AM d/w Dr. Blaine Hamper for admission to triad.  Pt to go to telemetry bed.   Alfonzo Beers, MD 12/28/14 [phone]

## 2014-12-28 ENCOUNTER — Inpatient Hospital Stay (HOSPITAL_COMMUNITY): Payer: PRIVATE HEALTH INSURANCE

## 2014-12-28 ENCOUNTER — Other Ambulatory Visit: Payer: Self-pay

## 2014-12-28 DIAGNOSIS — Z8719 Personal history of other diseases of the digestive system: Secondary | ICD-10-CM | POA: Diagnosis not present

## 2014-12-28 DIAGNOSIS — Z87891 Personal history of nicotine dependence: Secondary | ICD-10-CM | POA: Diagnosis not present

## 2014-12-28 DIAGNOSIS — Z932 Ileostomy status: Secondary | ICD-10-CM | POA: Diagnosis not present

## 2014-12-28 DIAGNOSIS — K51919 Ulcerative colitis, unspecified with unspecified complications: Secondary | ICD-10-CM

## 2014-12-28 DIAGNOSIS — R42 Dizziness and giddiness: Secondary | ICD-10-CM | POA: Diagnosis present

## 2014-12-28 DIAGNOSIS — H538 Other visual disturbances: Secondary | ICD-10-CM | POA: Diagnosis present

## 2014-12-28 DIAGNOSIS — F42 Obsessive-compulsive disorder: Secondary | ICD-10-CM

## 2014-12-28 DIAGNOSIS — Z3202 Encounter for pregnancy test, result negative: Secondary | ICD-10-CM | POA: Diagnosis present

## 2014-12-28 DIAGNOSIS — H4710 Unspecified papilledema: Secondary | ICD-10-CM | POA: Diagnosis not present

## 2014-12-28 DIAGNOSIS — K5190 Ulcerative colitis, unspecified, without complications: Secondary | ICD-10-CM | POA: Diagnosis present

## 2014-12-28 DIAGNOSIS — Z8659 Personal history of other mental and behavioral disorders: Secondary | ICD-10-CM | POA: Diagnosis not present

## 2014-12-28 LAB — VITAMIN B12: Vitamin B-12: 651 pg/mL (ref 180–914)

## 2014-12-28 LAB — CBC
HCT: 31.2 % — ABNORMAL LOW (ref 36.0–46.0)
Hemoglobin: 9.5 g/dL — ABNORMAL LOW (ref 12.0–15.0)
MCH: 21.3 pg — ABNORMAL LOW (ref 26.0–34.0)
MCHC: 30.4 g/dL (ref 30.0–36.0)
MCV: 69.8 fL — ABNORMAL LOW (ref 78.0–100.0)
Platelets: 369 10*3/uL (ref 150–400)
RBC: 4.47 MIL/uL (ref 3.87–5.11)
RDW: 15.8 % — ABNORMAL HIGH (ref 11.5–15.5)
WBC: 7.4 10*3/uL (ref 4.0–10.5)

## 2014-12-28 LAB — CORTISOL: Cortisol, Plasma: 0.9 ug/dL

## 2014-12-28 LAB — COMPREHENSIVE METABOLIC PANEL
ALT: 31 U/L (ref 14–54)
AST: 33 U/L (ref 15–41)
Albumin: 3.3 g/dL — ABNORMAL LOW (ref 3.5–5.0)
Alkaline Phosphatase: 131 U/L — ABNORMAL HIGH (ref 38–126)
Anion gap: 8 (ref 5–15)
BUN: 7 mg/dL (ref 6–20)
CO2: 22 mmol/L (ref 22–32)
Calcium: 8.9 mg/dL (ref 8.9–10.3)
Chloride: 109 mmol/L (ref 101–111)
Creatinine, Ser: 0.81 mg/dL (ref 0.44–1.00)
GFR calc Af Amer: >60 mL/min (ref >60)
GFR calc non Af Amer: >60 mL/min (ref >60)
Glucose, Bld: 90 mg/dL (ref 65–99)
Potassium: 4.3 mmol/L (ref 3.5–5.1)
Sodium: 139 mmol/L (ref 135–145)
Total Bilirubin: 0.1 mg/dL — ABNORMAL LOW (ref 0.3–1.2)
Total Protein: 7.1 g/dL (ref 6.5–8.1)

## 2014-12-28 LAB — HIV ANTIBODY (ROUTINE TESTING W REFLEX): HIV Screen 4th Generation wRfx: NONREACTIVE

## 2014-12-28 LAB — SEDIMENTATION RATE: Sed Rate: 47 mm/hr — ABNORMAL HIGH (ref 0–22)

## 2014-12-28 LAB — T4, FREE: Free T4: 0.83 ng/dL (ref 0.61–1.12)

## 2014-12-28 LAB — APTT: aPTT: 27 seconds (ref 24–37)

## 2014-12-28 LAB — PHOSPHORUS: Phosphorus: 4.7 mg/dL — ABNORMAL HIGH (ref 2.5–4.6)

## 2014-12-28 LAB — PROTIME-INR
INR: 1.11 (ref 0.00–1.49)
Prothrombin Time: 14.5 seconds (ref 11.6–15.2)

## 2014-12-28 LAB — AMMONIA: Ammonia: 29 umol/L (ref 9–35)

## 2014-12-28 LAB — MAGNESIUM: Magnesium: 2 mg/dL (ref 1.7–2.4)

## 2014-12-28 LAB — GLUCOSE, CAPILLARY: Glucose-Capillary: 100 mg/dL — ABNORMAL HIGH (ref 65–99)

## 2014-12-28 LAB — TSH: TSH: 2.921 u[IU]/mL (ref 0.350–4.500)

## 2014-12-28 LAB — FOLATE: Folate: 23.3 ng/mL (ref >5.9)

## 2014-12-28 LAB — C-REACTIVE PROTEIN: CRP: 1.5 mg/dL — ABNORMAL HIGH (ref <1.0)

## 2014-12-28 MED ORDER — ACETAMINOPHEN 325 MG PO TABS: 650.0000 mg | ORAL_TABLET | Freq: Four times a day (QID) | ORAL | Status: DC | PRN

## 2014-12-28 MED ORDER — SODIUM CHLORIDE 0.9 % IV SOLN
INTRAVENOUS | Status: DC
  Administered 2014-12-28: 03:00:00 via INTRAVENOUS

## 2014-12-28 MED ORDER — MORPHINE SULFATE 2 MG/ML IJ SOLN
2.0000 mg | INTRAMUSCULAR | Status: DC | PRN
  Administered 2014-12-28 (×2): 2 mg via INTRAVENOUS
  Filled 2014-12-28 (×3): qty 1

## 2014-12-28 MED ORDER — OXYCODONE HCL 5 MG PO TABS: 5.0000 mg | ORAL_TABLET | Freq: Two times a day (BID) | ORAL | Status: DC | PRN

## 2014-12-28 MED ORDER — ONDANSETRON HCL 4 MG PO TABS: 4.0000 mg | ORAL_TABLET | Freq: Four times a day (QID) | ORAL | Status: DC | PRN

## 2014-12-28 MED ORDER — ONDANSETRON HCL 4 MG/2ML IJ SOLN: 4.0000 mg | Freq: Three times a day (TID) | INTRAMUSCULAR | Status: DC | PRN

## 2014-12-28 MED ORDER — ALPRAZOLAM 0.25 MG PO TABS: 0.2500 mg | ORAL_TABLET | Freq: Every day | ORAL | Status: DC | PRN

## 2014-12-28 MED ORDER — ACETAMINOPHEN 650 MG RE SUPP
650.0000 mg | Freq: Four times a day (QID) | RECTAL | Status: DC | PRN
  Filled 2014-12-28: qty 1

## 2014-12-28 MED ORDER — ONDANSETRON HCL 4 MG/2ML IJ SOLN: 4.0000 mg | Freq: Four times a day (QID) | INTRAMUSCULAR | Status: DC | PRN

## 2014-12-28 MED ORDER — ADULT MULTIVITAMIN W/MINERALS CH: 1.0000 | ORAL_TABLET | Freq: Every day | ORAL | Status: DC

## 2014-12-28 MED ORDER — OLANZAPINE-FLUOXETINE HCL 3-25 MG PO CAPS
1.0000 | ORAL_CAPSULE | Freq: Once | ORAL | Status: AC
  Administered 2014-12-28: 1 via ORAL
  Filled 2014-12-28: qty 1

## 2014-12-28 MED ORDER — ONDANSETRON HCL 4 MG/2ML IJ SOLN
4.0000 mg | Freq: Once | INTRAMUSCULAR | Status: AC
  Administered 2014-12-28: 4 mg via INTRAVENOUS
  Filled 2014-12-28: qty 2

## 2014-12-28 MED ORDER — MORPHINE SULFATE 4 MG/ML IJ SOLN
4.0000 mg | Freq: Once | INTRAMUSCULAR | Status: AC
  Administered 2014-12-28: 4 mg via INTRAVENOUS
  Filled 2014-12-28: qty 1

## 2014-12-28 MED ORDER — OLANZAPINE-FLUOXETINE HCL 3-25 MG PO CAPS
1.0000 | ORAL_CAPSULE | Freq: Every day | ORAL | Status: DC
  Filled 2014-12-28: qty 1

## 2014-12-28 MED ORDER — SODIUM CHLORIDE 0.9 % IV BOLUS (SEPSIS)
500.0000 mL | Freq: Once | INTRAVENOUS | Status: AC
  Administered 2014-12-28: 500 mL via INTRAVENOUS

## 2014-12-28 MED ORDER — SODIUM CHLORIDE 0.9 % IJ SOLN
3.0000 mL | Freq: Two times a day (BID) | INTRAMUSCULAR | Status: DC
  Administered 2014-12-28 (×2): 3 mL via INTRAVENOUS

## 2014-12-28 NOTE — ED Notes (Signed)
Report attempted 

## 2014-12-28 NOTE — Discharge Instructions (Signed)
Do not drive, operating heavy machinery, perform activities at heights, swimming or participation in water activities or provide baby sitting services until you have seen by Primary MD or a Neurologist and advised to do so again.  Follow with Primary MD and Neurologist in 7 days   Get CBC, CMP, 2 view Chest X ray checked  by Primary MD next visit.    Activity: As tolerated with Full fall precautions use walker/cane & assistance as needed   Disposition Home     Diet: Heart Healthy    For Heart failure patients - Check your Weight same time everyday, if you gain over 2 pounds, or you develop in leg swelling, experience more shortness of breath or chest pain, call your Primary MD immediately. Follow Cardiac Low Salt Diet and 1.5 lit/day fluid restriction.   On your next visit with your primary care physician please Get Medicines reviewed and adjusted.   Please request your Prim.MD to go over all Hospital Tests and Procedure/Radiological results at the follow up, please get all Hospital records sent to your Prim MD by signing hospital release before you go home.   If you experience worsening of your admission symptoms, develop shortness of breath, life threatening emergency, suicidal or homicidal thoughts you must seek medical attention immediately by calling 911 or calling your MD immediately  if symptoms less severe.  You Must read complete instructions/literature along with all the possible adverse reactions/side effects for all the Medicines you take and that have been prescribed to you. Take any new Medicines after you have completely understood and accpet all the possible adverse reactions/side effects.    Do not drive when taking Pain medications.    Do not take more than prescribed Pain, Sleep and Anxiety Medications  Special Instructions: If you have smoked or chewed Tobacco  in the last 2 yrs please stop smoking, stop any regular Alcohol  and or any Recreational drug  use.  Wear Seat belts while driving.   Please note  You were cared for by a hospitalist during your hospital stay. If you have any questions about your discharge medications or the care you received while you were in the hospital after you are discharged, you can call the unit and asked to speak with the hospitalist on call if the hospitalist that took care of you is not available. Once you are discharged, your primary care physician will handle any further medical issues. Please note that NO REFILLS for any discharge medications will be authorized once you are discharged, as it is imperative that you return to your primary care physician (or establish a relationship with a primary care physician if you do not have one) for your aftercare needs so that they can reassess your need for medications and monitor your lab values.

## 2014-12-28 NOTE — ED Notes (Signed)
Report given to [facility]

## 2014-12-28 NOTE — ED Notes (Signed)
Pt requesting pain medication due to her back pain from the LP. Dr. Canary Brim informed.

## 2014-12-28 NOTE — Progress Notes (Signed)
Patient Discharge: Disposition: Patient discharged to home with family member. Education: Reviewed all her follow-up appointments, medications, and discharge instructions, understood and acknowledged it. IV: Discontinued IV before discharge. Telemetry: Discontinued Tele before discharge, CCMD notified. Transportation: Patient transferred in w/c with family and staff accompanying her. Belongings: Patient took all her belongings with her.

## 2014-12-28 NOTE — Consult Note (Signed)
Reason for Consult:Papilledema Referring Physician: Blaine Hamper  CC: Visual changes  HPI: Diana Page is an 24 y.o. female who reports that for the past two weeks she has been having recurrent spells.  When she has been sitting or lying down for prolonged periods of time, upon standing she will feel off balance and as if she is about to pass out.  Will get peripheral vision loss bilaterally, right worse than left, with flashing lights.  Will also get a fullness that causing changes in her hearing.  This spell lasts about 30 seconds then resolves spontaneously.  The patient went to the eye doctor who noted papilledema on his examination.  She was sent for MRI and with abnormalities noted patient was sent here for further evaluation.  Patient has had no progression of her symptoms since onset.  Reports that since her LP in the ED her symptoms have resolved completely, even with standing.  Opening pressure was not documented.   Patient has a history of UC and has been on many immunosuppressive treatments over the years.      Past Medical History  Diagnosis Date  . UC (ulcerative colitis)   . Pancreatitis   . Pancreatitis     hx of   . Obsessive compulsive disorder 06/14    Past Surgical History  Procedure Laterality Date  . Cholecystectomy    . Permanent ileostomy      Family History  Problem Relation Age of Onset  . OCD Mother   . OCD Sister   . OCD Brother     Social History:  reports that she quit smoking about 5 years ago. She has never used smokeless tobacco. She reports that she does not drink alcohol or use illicit drugs.  No Known Allergies  Medications:  I have reviewed the patient's current medications. Prior to Admission:  Prescriptions prior to admission  Medication Sig Dispense Refill Last Dose  . ALPRAZolam (XANAX) 0.25 MG tablet Take 0.25 mg by mouth daily as needed for anxiety.    12/27/2014 at Unknown time  . Multiple Vitamin (MULTIVITAMIN WITH MINERALS) TABS tablet  Take 1 tablet by mouth daily at 12 noon.   12/26/2014 at Unknown time  . OLANZapine-FLUoxetine (SYMBYAX) 3-25 MG per capsule Take 1 capsule by mouth every evening. (Patient taking differently: Take 1 capsule by mouth at bedtime. ) 30 capsule 0 12/26/2014 at Unknown time  . oxyCODONE (OXY IR/ROXICODONE) 5 MG immediate release tablet Take 1 tablet (5 mg total) by mouth every 6 (six) hours as needed for severe pain. (Patient taking differently: Take 5 mg by mouth 2 (two) times daily as needed for severe pain (pain). ) 30 tablet 0 12/26/2014 at Unknown time   Scheduled:  ROS: History obtained from the patient  General ROS: negative for - chills, fatigue, fever, night sweats, weight gain or weight loss Psychological ROS: negative for - behavioral disorder, hallucinations, memory difficulties, mood swings or suicidal ideation Ophthalmic ROS: negative for - blurry vision, double vision, eye pain or loss of vision ENT ROS: negative for - epistaxis, nasal discharge, oral lesions, sore throat, tinnitus or vertigo Allergy and Immunology ROS: negative for - hives or itchy/watery eyes Hematological and Lymphatic ROS: negative for - bleeding problems, bruising or swollen lymph nodes Endocrine ROS: negative for - galactorrhea, hair pattern changes, polydipsia/polyuria or temperature intolerance Respiratory ROS: negative for - cough, hemoptysis, shortness of breath or wheezing Cardiovascular ROS: negative for - chest pain, dyspnea on exertion, edema or irregular heartbeat Gastrointestinal ROS:  UC flare Genito-Urinary ROS: negative for - dysuria, hematuria, incontinence or urinary frequency/urgency Musculoskeletal ROS: negative for - joint swelling or muscular weakness Neurological ROS: as noted in HPI Dermatological ROS: negative for rash and skin lesion changes  Physical Examination: Blood pressure 107/73, pulse 95, temperature 99.1 F (37.3 C), temperature source Oral, resp. rate 16, height 5' 2" (1.575 m),  weight 94.348 kg (208 lb), last menstrual period 11/27/2014, SpO2 96 %.  HEENT-  Normocephalic, no lesions, without obvious abnormality.  Normal external eye and conjunctiva.  Normal TM's bilaterally.  Normal auditory canals and external ears. Normal external nose, mucus membranes and septum.  Normal pharynx. Cardiovascular- S1, S2 normal, pulses palpable throughout   Lungs- chest clear, no wheezing, rales, normal symmetric air entry Abdomen- soft, non-tender; bowel sounds normal; no masses,  no organomegaly Extremities- no edema Lymph-no adenopathy palpable Musculoskeletal-no joint tenderness, deformity or swelling Skin-warm and dry, no hyperpigmentation, vitiligo, or suspicious lesions  Neurological Examination Gen: Obese, in NAD Mental Status: Alert, oriented, thought content appropriate.  Speech fluent without evidence of aphasia.  Able to follow 3 step commands without difficulty. Cranial Nerves: II: Bilateral papilledema; Visual fields grossly normal, pupils equal, round, reactive to light and accommodation. No APD III,IV, VI: ptosis not present, extra-ocular motions intact bilaterally V,VII: smile symmetric, facial light touch sensation normal bilaterally VIII: hearing normal bilaterally IX,X: gag reflex present XI: bilateral shoulder shrug XII: midline tongue extension Motor: Right : Upper extremity   5/5    Left:     Upper extremity   5/5  Lower extremity   5/5     Lower extremity   5/5 Tone and bulk:normal tone throughout; no atrophy noted Sensory: Pinprick and light touch intact throughout, bilaterally Deep Tendon Reflexes: 2+ and symmetric throughout Plantars: Right: upgoing   Left: downgoing Cerebellar: normal finger-to-nose and normal heel-to-shin testing bilaterally   Laboratory Studies:   Basic Metabolic Panel:  Recent Labs Lab 12/26/14 2310 12/27/14 1750  NA 136 139  K 3.9 3.5  CL 103 107  CO2 24 25  GLUCOSE 104* 106*  BUN 11 9  CREATININE 0.85 0.86   CALCIUM 9.0 9.1    Liver Function Tests:  Recent Labs Lab 12/26/14 2310  AST 36  ALT 37  ALKPHOS 143*  BILITOT 0.3  PROT 7.9  ALBUMIN 3.7   No results for input(s): LIPASE, AMYLASE in the last 168 hours. No results for input(s): AMMONIA in the last 168 hours.  CBC:  Recent Labs Lab 12/26/14 2310 12/27/14 1750  WBC 9.4 7.5  NEUTROABS 5.6  --   HGB 9.6* 9.3*  HCT 30.8* 30.8*  MCV 70.0* 69.7*  PLT 386 387    Cardiac Enzymes: No results for input(s): CKTOTAL, CKMB, CKMBINDEX, TROPONINI in the last 168 hours.  BNP: Invalid input(s): POCBNP  CBG: No results for input(s): GLUCAP in the last 168 hours.  Microbiology: Results for orders placed or performed during the hospital encounter of 12/27/14  CSF culture     Status: None (Preliminary result)   Collection Time: 12/27/14  8:58 PM  Result Value Ref Range Status   Specimen Description CSF  Final   Special Requests NONE  Final   Gram Stain NO WBC SEEN NO ORGANISMS SEEN   Final   Culture PENDING  Incomplete   Report Status PENDING  Incomplete    Coagulation Studies: No results for input(s): LABPROT, INR in the last 72 hours.  Urinalysis:  Recent Labs Lab 12/27/14 0003  COLORURINE YELLOW  LABSPEC 1.006  PHURINE 6.5  GLUCOSEU NEGATIVE  HGBUR NEGATIVE  BILIRUBINUR NEGATIVE  KETONESUR NEGATIVE  PROTEINUR NEGATIVE  UROBILINOGEN 0.2  NITRITE NEGATIVE  LEUKOCYTESUR NEGATIVE    Lipid Panel:  No results found for: CHOL, TRIG, HDL, CHOLHDL, VLDL, LDLCALC  HgbA1C: No results found for: HGBA1C  Urine Drug Screen:  No results found for: LABOPIA, COCAINSCRNUR, LABBENZ, AMPHETMU, THCU, LABBARB  Alcohol Level: No results for input(s): ETH in the last 168 hours.   Imaging: Mr Brain Wo Contrast  12/27/2014   CLINICAL DATA:  Papilledema. Vision changes, lightheadedness, and dizziness over the last 2 weeks.  EXAM: MRI HEAD WITHOUT CONTRAST  TECHNIQUE: Multiplanar, multiecho pulse sequences of the brain and  surrounding structures were obtained without intravenous contrast.  COMPARISON:  None.  FINDINGS: Abnormal T2 signal is present within the medial temporal lobes bilaterally. This predominantly involves the hippocampal structures. The right hippocampus is swollen compared to the left.  No acute infarct or hemorrhage is present. Diffusion signal changes in the medial temporal with ribs is likely related to T2 shine through.  No significant white matter changes are present. The ventricles are of normal size. No other cortical abnormalities are present.  Flow is present in the major intracranial arteries. The globes and orbits are intact. There may be some edema in the distal right optic nerve.  The dural sinuses are patent. The paranasal sinuses and mastoid air cells are clear. The skullbase is within normal limits. Midline structures are normal.  IMPRESSION: 1. Abnormal signal within the medial temporal lobes bilaterally predominantly involving the hippocampal structures. This is concerning for infectious process including herpes encephalitis. Other viral encephalitides can give a similar picture. Limbic encephalitis can look like this, but is typically associated with lung cancer. Alternatively, this could be related to seizure activity. The patient has no history of seizures. 2. Question abnormal signal within the anterior aspect of the right optic nerve, potentially related to same process.  Dr. Frederico Hamman is office is not open at this time and will not be open for 3 days over the long weekend. I therefore contacted the patient directly and discussed concerns of possible encephalitis. I recommended that she go to the emergency department for further evaluation. She stated that she would go to the [facility] department for evaluation. At 4:50 p.m., I spoke with Hassan Rowan, one of the triage nurses who will be expecting her.   Electronically Signed   By: San Morelle M.D.   On: 12/27/2014 16:52   Dg Lumbar  Puncture Fluoro Guide  12/27/2014   CLINICAL DATA:  Question of encephalitis on MRI.  Initial encounter.  EXAM: DIAGNOSTIC LUMBAR PUNCTURE UNDER FLUOROSCOPIC GUIDANCE  FLUOROSCOPY TIME:  Fluoroscopy Time (in minutes and seconds): 12 seconds.  Number of Acquired Images:  Single fluoroscopic hold image saved.  PROCEDURE: Informed consent was obtained from the patient prior to the procedure, including potential complications of headache, allergy, and pain. With the patient prone, the lower back was prepped with Betadine. 1% Lidocaine was used for local anesthesia. Lumbar puncture was performed at the L4-L5 level using a 20 gauge needle with return of clear CSF. 8 mL of CSF were obtained for laboratory studies. The patient tolerated the procedure well and there were no apparent complications.  IMPRESSION: Successful lumbar puncture, with return of clear CSF.   Electronically Signed   By: Garald Balding M.D.   On: 12/27/2014 21:05     Assessment/Plan: 24 year old female referred for abnormal MRI and  papilledema.  MRI of the brain reviewed and shows abnormal signal in the hippocampi bilaterally (right greater than left).  Concern radiographically was for herpes encephalitis but patient has been symptomatic for 2 weeks and has not had evolution of her symptoms, or development of fever, seizures, mental status changes.  This makes herpes encephalitis less likely.  Can not rule out an autoimmune etiology.  LP performed and shows 1wbc, 1380 rbc, normal protein and glucose.  Gram stain negative.  No 4th tube to compare rbc's but suspect a bloody tap.  Further work up recommended.    Recommendations: 1.  EEG 2.  B12, B1, folate, TSH, ESR, CRP, CSF to be sent for herpes PCR, heavy metal screen, cortisol, magnesium, phosphorus, ammonia 3.  CXR 4.  Based on results will determine need for IVIg, Acyclovir or further testing.    Alexis Goodell, MD [facility] [phone] 12/28/2014, 2:49 AM

## 2014-12-28 NOTE — Discharge Summary (Signed)
Diana Page, is a 24 y.o. female  DOB May 04, 1991  MRN [id number].  Admission date:  12/27/2014  Admitting Physician  Ivor Costa, MD  Discharge Date:  12/28/2014   Primary MD  No PCP Per Patient  Recommendations for primary care physician for things to follow:   Follow-up final CSF culture results and CSF serology results, HIV antibody pending. Heavy metal in blood pending.   Admission Diagnosis  Dizziness [R42]   Discharge Diagnosis  Dizziness [R42]     Principal Problem:   Dizziness Active Problems:   Ulcerative colitis   Obsessive compulsive disorder      Past Medical History  Diagnosis Date  . UC (ulcerative colitis)   . Pancreatitis   . Pancreatitis     hx of   . Obsessive compulsive disorder 06/14    Past Surgical History  Procedure Laterality Date  . Cholecystectomy    . Permanent ileostomy         HPI  from the history and physical done on the day of admission:    Diana Page is a 23 y.o. female with PMH of ulcerative colitis, pancreatitis, OCD, permanent ileostomy, who presents with dizziness and vision change.  Patient reports that in the past 2 weeks, she has been having intermittent dizziness, lightheadedness and vision change. She reports that feels dizzy and lightheaded upon standing. She sees stars in both eyes (peripheral vision), R side worse than the left. She states the symptoms have been coming and going for the past 2 weeks. She was seen by ophthalmologist, Dr. Frederico Hamman on 12/15/14. 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 Eye doctor also ordered MRI, MRA and LP to evaluate for pseudotumor. 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 She was called afterwards by the radiologist advising her  to come to the ED for lumbar puncture. Patient dose not have fever, headache, neck stiffness, unilateral weakness, numbness or tingling sensations. No seizure activities. She had LP in ED, and she reports that since her LP, her symptoms have resolved. Of note, patient has a history of UC and has been on many immunosuppressive treatments over the years. She has permanent ileostomy. She has moderate pain over LP site.  In ED, patient was found to haveWBC 7.5, temperature 99.1, tachycardia, electrolytes okay, negative pregnancy test. LP was done in ED. Initial result of CSF analysis: 1wbc, 1380 rbc, normal protein and glucose.Gram stain negative. Patient is an immediately inpatient for further evaluation and treatment. Neurology was consulted.      Hospital Course:     1. Dizziness and flashes of light. With nonspecific MRI brain findings seen in the outpatient setting ordered by patient's ophthalmologist, patient was seen here by neurologist underwent LP which looked unremarkable, no signs of acute infection, discussed her case with neurologist Dr. Janann Colonel who elevated the patient, for now she will be discharged with outpatient follow-up with PCP and neurology along with ophthalmology. Kindly follow -up final  CSF culture results and CSF serology results, HIV antibody pending. Heavy metal in blood pending.  Of note patient felt better after LP, although opening pressure was not measured this could know to words pseudotumor cerebri, also patient has had exposure to Humira in the past for ulcerative colitis. Question if her MRI lesions are due to Humira use. TSH, B-12 checked here was unremarkable. She will follow with neurology outpatient in 1-2 weeks.   2. Ulcerative colitis status post colon resection and ileostomy. Follow with Dr. Benson Norway her primary gastroenterologist.   3. OCD and depression. Continue home medications unchanged.      Discharge Condition: Stable  Follow UP  Follow-up  Information    Follow up with [facility]. Schedule an appointment as soon as possible for a visit in 1 week.   Why:  All UP CSF findings and MRI RESULTS   Contact information:   [address] [place] [address] [phone]      Follow up with Philis Fendt, MD. Schedule an appointment as soon as possible for a visit in 1 week.   Specialty:  Internal Medicine   Contact information:   [address] [place] [address] [phone]       Follow up with SPENCER,MICHAEL A, MD. Schedule an appointment as soon as possible for a visit in 1 week.   Specialty:  Ophthalmology   Contact information:   [place] [address] [phone]        Consults obtained - NEURO  Diet and Activity recommendation: See Discharge Instructions below  Discharge Instructions       Discharge Instructions    Ambulatory referral to Neurology    Complete by:  As directed      Diet - low sodium heart healthy    Complete by:  As directed      Discharge instructions    Complete by:  As directed   Do not drive, operating heavy machinery, perform activities at heights, swimming or participation in water activities or provide baby sitting services until you have seen by Primary MD or a Neurologist and advised to do so again.  Follow with Primary MD and Neurologist in 7 days   Get CBC, CMP, 2 view Chest X ray checked  by Primary MD next visit.    Activity: As tolerated with Full fall precautions use walker/cane & assistance as needed   Disposition Home     Diet: Heart Healthy    For Heart failure patients - Check your Weight same time everyday, if you gain over 2 pounds, or you develop in leg swelling, experience more shortness of breath or chest pain, call your Primary MD immediately. Follow Cardiac Low Salt Diet and 1.5 lit/day fluid restriction.   On your next visit with your primary care physician please Get  Medicines reviewed and adjusted.   Please request your Prim.MD to go over all Hospital Tests and Procedure/Radiological results at the follow up, please get all Hospital records sent to your Prim MD by signing hospital release before you go home.   If you experience worsening of your admission symptoms, develop shortness of breath, life threatening emergency, suicidal or homicidal thoughts you must seek medical attention immediately by calling 911 or calling your MD immediately  if symptoms less severe.  You Must read complete instructions/literature along with all the possible adverse reactions/side effects for all the Medicines you take and that have been prescribed to you. Take  any new Medicines after you have completely understood and accpet all the possible adverse reactions/side effects.    Do not drive when taking Pain medications.    Do not take more than prescribed Pain, Sleep and Anxiety Medications  Special Instructions: If you have smoked or chewed Tobacco  in the last 2 yrs please stop smoking, stop any regular Alcohol  and or any Recreational drug use.  Wear Seat belts while driving.   Please note  You were cared for by a hospitalist during your hospital stay. If you have any questions about your discharge medications or the care you received while you were in the hospital after you are discharged, you can call the unit and asked to speak with the hospitalist on call if the hospitalist that took care of you is not available. Once you are discharged, your primary care physician will handle any further medical issues. Please note that NO REFILLS for any discharge medications will be authorized once you are discharged, as it is imperative that you return to your primary care physician (or establish a relationship with a primary care physician if you do not have one) for your aftercare needs so that they can reassess your need for medications and monitor your lab values.              Discharge Medications       Medication List    TAKE these medications        ALPRAZolam 0.25 MG tablet  Commonly known as:  XANAX  Take 0.25 mg by mouth daily as needed for anxiety.     multivitamin with minerals Tabs tablet  Take 1 tablet by mouth daily at 12 noon.     OLANZapine-FLUoxetine 3-25 MG per capsule  Commonly known as:  SYMBYAX  Take 1 capsule by mouth every evening.     oxyCODONE 5 MG immediate release tablet  Commonly known as:  Oxy IR/ROXICODONE  Take 1 tablet (5 mg total) by mouth every 6 (six) hours as needed for severe pain.        Major procedures and Radiology Reports - PLEASE review detailed and final reports for all details, in brief -       Mr Brain Wo Contrast  12/27/2014   CLINICAL DATA:  Papilledema. Vision changes, lightheadedness, and dizziness over the last 2 weeks.  EXAM: MRI HEAD WITHOUT CONTRAST  TECHNIQUE: Multiplanar, multiecho pulse sequences of the brain and surrounding structures were obtained without intravenous contrast.  COMPARISON:  None.  FINDINGS: Abnormal T2 signal is present within the medial temporal lobes bilaterally. This predominantly involves the hippocampal structures. The right hippocampus is swollen compared to the left.  No acute infarct or hemorrhage is present. Diffusion signal changes in the medial temporal with ribs is likely related to T2 shine through.  No significant white matter changes are present. The ventricles are of normal size. No other cortical abnormalities are present.  Flow is present in the major intracranial arteries. The globes and orbits are intact. There may be some edema in the distal right optic nerve.  The dural sinuses are patent. The paranasal sinuses and mastoid air cells are clear. The skullbase is within normal limits. Midline structures are normal.  IMPRESSION: 1. Abnormal signal within the medial temporal lobes bilaterally predominantly involving the hippocampal structures. This is  concerning for infectious process including herpes encephalitis. Other viral encephalitides can give a similar picture. Limbic encephalitis can look like this, but is typically associated with lung cancer.  Alternatively, this could be related to seizure activity. The patient has no history of seizures. 2. Question abnormal signal within the anterior aspect of the right optic nerve, potentially related to same process.  Dr. Frederico Hamman is office is not open at this time and will not be open for 3 days over the long weekend. I therefore contacted the patient directly and discussed concerns of possible encephalitis. I recommended that she go to the emergency department for further evaluation. She stated that she would go to the [facility] department for evaluation. At 4:50 p.m., I spoke with Hassan Rowan, one of the triage nurses who will be expecting her.   Electronically Signed   By: San Morelle M.D.   On: 12/27/2014 16:52   Dg Lumbar Puncture Fluoro Guide  12/27/2014   CLINICAL DATA:  Question of encephalitis on MRI.  Initial encounter.  EXAM: DIAGNOSTIC LUMBAR PUNCTURE UNDER FLUOROSCOPIC GUIDANCE  FLUOROSCOPY TIME:  Fluoroscopy Time (in minutes and seconds): 12 seconds.  Number of Acquired Images:  Single fluoroscopic hold image saved.  PROCEDURE: Informed consent was obtained from the patient prior to the procedure, including potential complications of headache, allergy, and pain. With the patient prone, the lower back was prepped with Betadine. 1% Lidocaine was used for local anesthesia. Lumbar puncture was performed at the L4-L5 level using a 20 gauge needle with return of clear CSF. 8 mL of CSF were obtained for laboratory studies. The patient tolerated the procedure well and there were no apparent complications.  IMPRESSION: Successful lumbar puncture, with return of clear CSF.   Electronically Signed   By: Garald Balding M.D.   On: 12/27/2014 21:05    Micro Results      Recent Results (from the past  240 hour(s))  CSF culture     Status: None (Preliminary result)   Collection Time: 12/27/14  8:58 PM  Result Value Ref Range Status   Specimen Description CSF  Final   Special Requests NONE  Final   Gram Stain NO WBC SEEN NO ORGANISMS SEEN   Final   Culture PENDING  Incomplete   Report Status PENDING  Incomplete    Today   Subjective    Diana Page today has no headache,no chest abdominal pain,no new weakness tingling or numbness, feels much better wants to go home today.     Objective   Blood pressure 105/63, pulse 85, temperature 98 F (36.7 C), temperature source Oral, resp. rate 18, height 5' 2"  (1.575 m), weight 94.348 kg (208 lb), last menstrual period 11/27/2014, SpO2 100 %.   Intake/Output Summary (Last 24 hours) at 12/28/14 0927 Last data filed at 12/28/14 0827  Gross per 24 hour  Intake    240 ml  Output      0 ml  Net    240 ml    Exam Awake Alert, Oriented x 3, No new F.N deficits, Normal affect [place].AT,PERRAL Supple Neck,No JVD, No cervical lymphadenopathy appriciated.  Symmetrical Chest wall movement, Good air movement bilaterally, CTAB RRR,No Gallops,Rubs or new Murmurs, No Parasternal Heave +ve B.Sounds, Abd Soft, Non tender, No organomegaly appriciated, No rebound -guarding or rigidity. No Cyanosis, Clubbing or edema, No new Rash or bruise   Data Review   CBC w Diff: Lab Results  Component Value Date   WBC 7.4 12/28/2014   HGB 9.5* 12/28/2014   HCT 31.2* 12/28/2014   PLT 369 12/28/2014   LYMPHOPCT 33 12/26/2014   MONOPCT 5 12/26/2014   EOSPCT 2 12/26/2014   BASOPCT  0 12/26/2014    CMP: Lab Results  Component Value Date   NA 139 12/28/2014   K 4.3 12/28/2014   CL 109 12/28/2014   CO2 22 12/28/2014   BUN 7 12/28/2014   CREATININE 0.81 12/28/2014   PROT 7.1 12/28/2014   ALBUMIN 3.3* 12/28/2014   BILITOT 0.1* 12/28/2014   ALKPHOS 131* 12/28/2014   AST 33 12/28/2014   ALT 31 12/28/2014  . Results for JOUD, PETTINATO (MRN  [id number]) as of 12/28/2014 09:27  Ref. Range 12/27/2014 20:58  Glucose, CSF Latest Ref Range: 40-70 mg/dL 57  Total  Protein, CSF Latest Ref Range: 15-45 mg/dL 25  RBC Count, CSF Latest Ref Range: 0 /cu mm 1380 (H)  WBC, CSF Latest Ref Range: 0-5 /cu mm 1  Segmented Neutrophils-CSF Latest Ref Range: 0-6 % RARE  Other Cells, CSF Unknown TOO FEW TO COUNT,...  Appearance, CSF Latest Ref Range: CLEAR  CLEAR  Color, CSF Latest Ref Range: COLORLESS  PINK (A)  Supernatant Unknown CLEAR  Tube # Unknown 1   Total Time in preparing paper work, data evaluation and todays exam - 35 minutes  Thurnell Lose M.D on 12/28/2014 at 9:27 AM  [facility]   Office  [phone]

## 2014-12-28 NOTE — H&P (Signed)
[facility] History and Physical  Minela Bridgewater BWG:[id number] DOB: 01/11/1991 DOA: 12/27/2014  Referring physician: ED physician PCP: No PCP Per Patient  Specialists:   Chief Complaint: Dizziness and vision changes  HPI: Diana Page is a 24 y.o. female with PMH of ulcerative colitis, pancreatitis, OCD, permanent ileostomy, who presents with dizziness and vision change.  Patient reports that in the past 2 weeks, she has been having intermittent dizziness, lightheadedness and vision change. She reports that feels dizzy and lightheaded upon standing. She sees stars in both eyes (peripheral vision), R side worse than the left. She states the symptoms have been coming and going for the past 2 weeks. She was seen by ophthalmologist, Dr. Frederico Hamman on 12/15/14. 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 Eye doctor also ordered MRI, MRA and LP to evaluate for pseudotumor. 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 She was called afterwards by the radiologist advising her to come to the ED for lumbar puncture. Patient dose not have fever, headache, neck stiffness, unilateral weakness, numbness or tingling sensations. No seizure activities. She had LP in ED, and she reports that since her LP,  her symptoms have resolved. Of note, patient has a history of UC and has been on many immunosuppressive treatments over the years. She has permanent ileostomy. She has moderate pain over LP site.  In ED, patient was found to have WBC 7.5, temperature 99.1, tachycardia, electrolytes okay, negative pregnancy test. LP was done in ED. Initial result of CSF analysis:  1wbc, 1380 rbc, normal protein and glucose. Gram stain negative. Patient is an immediately inpatient for further evaluation and treatment. Neurology was consulted.   Where does patient live?   At home  Can patient participate in ADLs?  Yes   Review of Systems:    General: no fevers, chills, no changes in body weight, has fatigue HEENT: has blurry vision and vision change, no sore throat or running nose Pulm: no dyspnea, coughing, wheezing CV: no chest pain, palpitations Abd: no nausea, vomiting, abdominal pain, diarrhea, constipation GU: no dysuria, burning on urination, increased urinary frequency, hematuria  Ext: no leg edema Neuro: no unilateral weakness, numbness, or tingling, no vision change or hearing loss Skin: no rash MSK: No muscle spasm, no deformity, no limitation of range of movement in spin Heme: No easy bruising.  Travel history: No recent long distant travel.  Allergy: No Known Allergies  Past Medical History  Diagnosis Date  . UC (ulcerative colitis)   . Pancreatitis   . Pancreatitis     hx of   . Obsessive compulsive disorder 06/14    Past Surgical History  Procedure Laterality Date  . Cholecystectomy    . Permanent ileostomy      Social History:  reports that she quit smoking about 5 years ago. She has never used smokeless tobacco. She reports that she does not drink alcohol or use illicit drugs.  Family History:  Family History  Problem Relation Age of Onset  . OCD Mother   . OCD Sister   . OCD Brother      Prior to Admission medications   Medication Sig Start Date End Date Taking? Authorizing Provider  ALPRAZolam Duanne Moron) 0.25 MG tablet Take 0.25 mg by mouth daily as needed for anxiety.    Yes Historical Provider, MD  Multiple Vitamin (MULTIVITAMIN WITH MINERALS) TABS tablet Take 1 tablet by mouth daily at 12 noon.  Yes Historical Provider, MD  OLANZapine-FLUoxetine (SYMBYAX) 3-25 MG per capsule Take 1 capsule by mouth every evening. Patient taking differently: Take 1 capsule by mouth at bedtime.  12/05/14  Yes Kathlee Nations, MD  oxyCODONE (OXY IR/ROXICODONE) 5 MG immediate release tablet Take 1 tablet (5 mg total) by mouth every 6 (six) hours as needed for severe pain. Patient taking differently: Take 5  mg by mouth 2 (two) times daily as needed for severe pain (pain).  07/20/14  Yes Estela Leonie Green, MD    Physical Exam: Filed Vitals:   12/28/14 0115 12/28/14 0200 12/28/14 0230 12/28/14 0304  BP: 116/71 118/64 107/73 111/47  Pulse: 82 92 95 94  Temp:    98.2 F (36.8 C)  TempSrc:    Oral  Resp:    18  Height:      Weight:      SpO2: 100% 100% 96% 100%   General: Not in acute distress HEENT:       Eyes: PERRL, EOMI, no scleral icterus.       ENT: No discharge from the ears and nose, no pharynx injection, no tonsillar enlargement.        Neck: No JVD, no bruit, no mass felt. Heme: No neck lymph node enlargement. Cardiac: S1/S2, RRR, No murmurs, No gallops or rubs. Pulm: Good air movement bilaterally. No rales, wheezing, rhonchi or rubs. Abd: Soft, nondistended, nontender, no rebound pain, no organomegaly, BS present. Has ileostomy, surroundings are clean Ext: No pitting leg edema bilaterally. 2+DP/PT pulse bilaterally. Musculoskeletal: No joint deformities, No joint redness or warmth, no limitation of ROM in spin. Skin: No rashes.  Neuro: Alert, oriented X3, cranial nerves II-XII grossly intact, muscle strength 5/5 in all extremities, sensation to light touch intact. Brachial reflex 2+ bilaterally. Knee reflex 1+ bilaterally. Negative Babinski's sign. Normal finger to nose test. Psych: Patient is not psychotic, no suicidal or hemocidal ideation.  Labs on Admission:  Basic Metabolic Panel:  Recent Labs Lab 12/26/14 2310 12/27/14 1750  NA 136 139  K 3.9 3.5  CL 103 107  CO2 24 25  GLUCOSE 104* 106*  BUN 11 9  CREATININE 0.85 0.86  CALCIUM 9.0 9.1   Liver Function Tests:  Recent Labs Lab 12/26/14 2310  AST 36  ALT 37  ALKPHOS 143*  BILITOT 0.3  PROT 7.9  ALBUMIN 3.7   No results for input(s): LIPASE, AMYLASE in the last 168 hours. No results for input(s): AMMONIA in the last 168 hours. CBC:  Recent Labs Lab 12/26/14 2310 12/27/14 1750  WBC 9.4  7.5  NEUTROABS 5.6  --   HGB 9.6* 9.3*  HCT 30.8* 30.8*  MCV 70.0* 69.7*  PLT 386 387   Cardiac Enzymes: No results for input(s): CKTOTAL, CKMB, CKMBINDEX, TROPONINI in the last 168 hours.  BNP (last 3 results) No results for input(s): BNP in the last 8760 hours.  ProBNP (last 3 results) No results for input(s): PROBNP in the last 8760 hours.  CBG: No results for input(s): GLUCAP in the last 168 hours.  Radiological Exams on Admission: Mr Brain Wo Contrast  12/27/2014   CLINICAL DATA:  Papilledema. Vision changes, lightheadedness, and dizziness over the last 2 weeks.  EXAM: MRI HEAD WITHOUT CONTRAST  TECHNIQUE: Multiplanar, multiecho pulse sequences of the brain and surrounding structures were obtained without intravenous contrast.  COMPARISON:  None.  FINDINGS: Abnormal T2 signal is present within the medial temporal lobes bilaterally. This predominantly involves the hippocampal structures. The right hippocampus  is swollen compared to the left.  No acute infarct or hemorrhage is present. Diffusion signal changes in the medial temporal with ribs is likely related to T2 shine through.  No significant white matter changes are present. The ventricles are of normal size. No other cortical abnormalities are present.  Flow is present in the major intracranial arteries. The globes and orbits are intact. There may be some edema in the distal right optic nerve.  The dural sinuses are patent. The paranasal sinuses and mastoid air cells are clear. The skullbase is within normal limits. Midline structures are normal.  IMPRESSION: 1. Abnormal signal within the medial temporal lobes bilaterally predominantly involving the hippocampal structures. This is concerning for infectious process including herpes encephalitis. Other viral encephalitides can give a similar picture. Limbic encephalitis can look like this, but is typically associated with lung cancer. Alternatively, this could be related to seizure  activity. The patient has no history of seizures. 2. Question abnormal signal within the anterior aspect of the right optic nerve, potentially related to same process.  Dr. Frederico Hamman is office is not open at this time and will not be open for 3 days over the long weekend. I therefore contacted the patient directly and discussed concerns of possible encephalitis. I recommended that she go to the emergency department for further evaluation. She stated that she would go to the [facility] department for evaluation. At 4:50 p.m., I spoke with Hassan Rowan, one of the triage nurses who will be expecting her.   Electronically Signed   By: San Morelle M.D.   On: 12/27/2014 16:52   Dg Lumbar Puncture Fluoro Guide  12/27/2014   CLINICAL DATA:  Question of encephalitis on MRI.  Initial encounter.  EXAM: DIAGNOSTIC LUMBAR PUNCTURE UNDER FLUOROSCOPIC GUIDANCE  FLUOROSCOPY TIME:  Fluoroscopy Time (in minutes and seconds): 12 seconds.  Number of Acquired Images:  Single fluoroscopic hold image saved.  PROCEDURE: Informed consent was obtained from the patient prior to the procedure, including potential complications of headache, allergy, and pain. With the patient prone, the lower back was prepped with Betadine. 1% Lidocaine was used for local anesthesia. Lumbar puncture was performed at the L4-L5 level using a 20 gauge needle with return of clear CSF. 8 mL of CSF were obtained for laboratory studies. The patient tolerated the procedure well and there were no apparent complications.  IMPRESSION: Successful lumbar puncture, with return of clear CSF.   Electronically Signed   By: Garald Balding M.D.   On: 12/27/2014 21:05    EKG: Not done in ED, will get one.   Assessment/Plan Principal Problem:   Dizziness Active Problems:   Ulcerative colitis   Obsessive compulsive disorder  Dizziness and vision change: etiology is not clear. Neurology was consulted. Dr. Doy Mince saw patient. Per Dr. Doy Mince, less likely to have  herpes encephalitis given patient has been symptomatic for 2 weeks and has not had evolution of her symptoms. Need to r/o an autoimmune etiology.  Dr. Doy Mince suspects that the red blood cells in CSF is likely traumatic.  -will admit to tele bed -Appreciate Reynolds consultation, with follow-up recommendations as follows: 1.  EEG 2.  B12, B1, folate, TSH, ESR, CRP, CSF to be sent for herpes PCR, heavy metal screen, cortisol, magnesium, phosphorus, ammonia 3.  CXR 4.  Based on results will determine need for IVIg, Acyclovir or further testing.   -Also follow up VDRl and HIV ab  OCD: stable -continue Symbyax -PRN xanax  Ulcerative Colitis: Stable. Not  on medications currently. No AP.  -Has ileostomy   DVT ppx: SCD  Code Status: Full code Family Communication:  Yes, patient's boyfriend at bed side Disposition Plan: Admit to inpatient   Date of Service 12/28/2014    Ivor Costa [facility] Pager [phone]  If 7PM-7AM, please contact night-coverage [email or web address] Password [email or web address] 12/28/2014, 4:50 AM

## 2014-12-30 LAB — VDRL, CSF: VDRL Quant, CSF: NONREACTIVE

## 2014-12-30 LAB — HEAVY METALS, BLOOD
Arsenic: 15 ug/L (ref 2–23)
Lead: NOT DETECTED ug/dL (ref 0–19)
Mercury: 1.8 ug/L (ref 0.0–14.9)

## 2014-12-30 LAB — HERPES SIMPLEX VIRUS(HSV) DNA BY PCR
HSV 1 DNA: NEGATIVE
HSV 2 DNA: NEGATIVE

## 2014-12-31 ENCOUNTER — Telehealth: Payer: Self-pay | Admitting: *Deleted

## 2014-12-31 ENCOUNTER — Ambulatory Visit (INDEPENDENT_AMBULATORY_CARE_PROVIDER_SITE_OTHER): Payer: PRIVATE HEALTH INSURANCE | Admitting: Neurology

## 2014-12-31 ENCOUNTER — Encounter: Payer: Self-pay | Admitting: Neurology

## 2014-12-31 VITALS — BP 109/70 | HR 98 | Temp 98.4°F | Ht 62.0 in | Wt 207.4 lb

## 2014-12-31 DIAGNOSIS — G4489 Other headache syndrome: Secondary | ICD-10-CM

## 2014-12-31 DIAGNOSIS — H4710 Unspecified papilledema: Secondary | ICD-10-CM

## 2014-12-31 DIAGNOSIS — G939 Disorder of brain, unspecified: Secondary | ICD-10-CM | POA: Diagnosis not present

## 2014-12-31 DIAGNOSIS — G932 Benign intracranial hypertension: Secondary | ICD-10-CM | POA: Diagnosis not present

## 2014-12-31 LAB — CSF CULTURE W GRAM STAIN

## 2014-12-31 LAB — CSF CULTURE
Culture: NO GROWTH
Gram Stain: NONE SEEN

## 2014-12-31 LAB — VITAMIN B1: Vitamin B1 (Thiamine): 136.9 nmol/L (ref 66.5–200.0)

## 2014-12-31 NOTE — Patient Instructions (Signed)
Overall you are doing fairly well but I do want to suggest a few things today:   Remember to drink plenty of fluid, eat healthy meals and do not skip any meals. Try to eat protein with a every meal and eat a healthy snack such as fruit or nuts in between meals. Try to keep a regular sleep-wake schedule and try to exercise daily, particularly in the form of walking, 20-30 minutes a day, if you can.   As far as diagnostic testing: EEG, lumbar puncture. Will consider repeat MRI.  I would like to see you back in 3 months, sooner if we need to. Please call us with any interim questions, concerns, problems, updates or refill requests.   Please also call us for any test results so we can go over those with you on the phone.  My clinical assistant and will answer any of your questions and relay your messages to me and also relay most of my messages to you.   Our phone number is [phone]. We also have an after hours call service for urgent matters and there is a physician on-call for urgent questions. For any emergencies you know to call 911 or go to the nearest emergency room

## 2014-12-31 NOTE — Telephone Encounter (Signed)
Spoke with Anderson Malta from [facility] to see if they can schedule pt for LP on Friday. She said they can fit her in and they will call the pt.

## 2014-12-31 NOTE — Telephone Encounter (Signed)
tHANK YOU!

## 2014-12-31 NOTE — Telephone Encounter (Signed)
Spoke with Gwinda Passe from [facility] and she stated Anderson Malta was scheduling another pt and will call me back. Gave pt name and DOB for betsy to see if pt can be fit in Friday for LP. Told Anderson Malta will call me back.

## 2014-12-31 NOTE — Progress Notes (Signed)
[name]    Provider:  [name] Referring Provider: Drema Dallas, DO Primary Care Physician:  Philis Fendt, MD  CC:  Headache and vision loss, dizziness  HPI:  Diana Page is a 24 y.o. female here as a referral from Dr. Jeanie Cooks for dizziness.  PMHx obesity. She was seen in the ED for acute onset headache and dizziness with decreased peripheral vision. She had an abnormal MRI and was called back to the ED for lumbar puncture which showed negative/normal CSF analysis. Unfortunately the LP did not include an opening pressure. She felt much better after the LP however. Currently if she stands up, she gets a thumping in the head. She gets stars in her right > Left peripheral vision. She gets pressure in the head all over. If she lays on her stomach, the pressure is worse and she can feel her heartbeat in her head. She went to ophthalmology who diagnosed edema of the optic disks. Right eye has blurry vision which is persistent. She has had severe headaches. The dizziness has resolved after the LP. Symptoms felt much better after LP. Denies seizure-like events, staring, altered awareness. Symptoms are daily. No other associated focal neurologic deficits. No neck pain.   Reviewed notes, labs and imaging from outside physicians, which showed: Personally reviewed images: 1. Abnormal signal within the medial temporal lobes bilaterally predominantly involving the hippocampal structures. This is concerning for infectious process including herpes encephalitis. Other viral encephalitides can give a similar picture. Limbic encephalitis can look like this, but is typically associated with lung cancer. Alternatively, this could be related to seizure activity. The patient has no history of seizures. 2. Question abnormal signal within the anterior aspect of the right optic nerve, potentially related to same process.   Labs:   CSF: protein, glucose, diff, cell count normal TSH,  B12, B1, HIV wnl CRP, ESR elevated CMP unremarkable CBC w/ anemia   Review of Systems: Patient complains of symptoms per HPI as well as the following symptoms: Blurred vision, fatigue, anemia, easy bruising, headache, weakness, dizziness, increased thirst, blood instool, headache, weaknes,. Pertinent negatives per HPI. All others negative.   History   Social History  . Marital Status: Single    Spouse Name: N/A  . Number of Children: 0  . Years of Education: Ba   Occupational History  . Not on file.   Social History Main Topics  . Smoking status: Former Smoker    Quit date: 06/27/2009  . Smokeless tobacco: Never Used  . Alcohol Use: No  . Drug Use: No  . Sexual Activity: Not on file   Other Topics Concern  . Not on file   Social History Narrative   Lives at home with boyfriend, Lennette Bihari.   Caffeine use: Tea occass    Family History  Problem Relation Age of Onset  . OCD Mother   . OCD Sister   . OCD Brother     Past Medical History  Diagnosis Date  . UC (ulcerative colitis)   . Pancreatitis   . Pancreatitis     hx of   . Obsessive compulsive disorder 06/14    Past Surgical History  Procedure Laterality Date  . Cholecystectomy    . Permanent ileostomy      Current Outpatient Prescriptions  Medication Sig Dispense Refill  . ALPRAZolam (XANAX) 0.25 MG tablet Take 0.25 mg by mouth daily as needed for anxiety.     . Multiple Vitamin (MULTIVITAMIN WITH MINERALS) TABS tablet Take 1  tablet by mouth daily at 12 noon.    Marland Kitchen OLANZapine-FLUoxetine (SYMBYAX) 3-25 MG per capsule Take 1 capsule by mouth every evening. (Patient taking differently: Take 1 capsule by mouth at bedtime. ) 30 capsule 0  . oxyCODONE (OXY IR/ROXICODONE) 5 MG immediate release tablet Take 1 tablet (5 mg total) by mouth every 6 (six) hours as needed for severe pain. (Patient taking differently: Take 5 mg by mouth daily as needed for severe pain (pain). ) 30 tablet 0   No current  facility-administered medications for this visit.    Allergies as of 12/31/2014  . (No Known Allergies)    Vitals: BP 109/70 mmHg  Pulse 98  Temp(Src) 98.4 F (36.9 C) (Oral)  Ht _0  (1.575 m)  Wt 207 lb 6.4 oz (94.076 kg)  BMI 37.92 kg/m2  LMP 11/27/2014 Last Weight:  Wt Readings from Last 1 Encounters:  12/31/14 207 lb 6.4 oz (94.076 kg)   Last Height:   Ht Readings from Last 1 Encounters:  12/31/14 _1  (1.575 m)   Physical exam: Exam: Gen: NAD, conversant, well nourised, obese, well groomed                     CV: RRR, no MRG. No Carotid Bruits. No peripheral edema, warm, nontender Eyes: Conjunctivae clear without exudates or hemorrhage  Neuro: Detailed Neurologic Exam  Speech:    Speech is normal; fluent and spontaneous with normal comprehension.  Cognition:    The patient is oriented to person, place, and time;     recent and remote memory intact;     language fluent;     normal attention, concentration,     fund of knowledge Cranial Nerves:    The pupils are equal, round, and reactive to light. R>L disk edema. Visual fields are full to finger confrontation. Extraocular movements are intact. Trigeminal sensation is intact and the muscles of mastication are normal. The face is symmetric. The palate elevates in the midline. Hearing intact. Voice is normal. Shoulder shrug is normal. The tongue has normal motion without fasciculations.   Coordination:    Normal finger to nose and heel to shin. Normal rapid alternating movements.   Gait:    Heel-toe and tandem gait are normal.   Motor Observation:    No asymmetry, no atrophy, and no involuntary movements noted. Tone:    Normal muscle tone.    Posture:    Posture is normal. normal erect    Strength:    Strength is V/V in the upper and lower limbs.      Sensation: intact to LT     Reflex Exam:  DTR's:    Deep tendon reflexes in the upper and lower extremities are normal bilaterally.   Toes:    The  toes are downgoing bilaterally.   Clonus:    Clonus is absent.       Assessment/Plan:  24 year old female with papilledema, dizziness, positional headache, vision and hearing changes. Abnormal MRI with abnormal signal within the medial temporal lobes bilaterally predominantly involving the hippocampal structures. This is concerning for infectious process including viral encephalitis but csf was negative, no seizure activity. Possibly abnormal signal within the anterior aspect of the right optic nerve, potentially related to same process.   Will order LP with opening pressure. Will repeat MRI of the brain w/wo contrast F/u in 4 weeks   Sarina Ill, MD  [facility] [address] [place], [place] [address]  Phone [phone] Fax [phone]

## 2014-12-31 NOTE — Telephone Encounter (Signed)
Spoke with pt and she stated Anderson Malta called her and scheduled appt for LP at 11:15am on Friday 01/02/15. Told her to call us if she had any questions. Pt verbalized understanding.

## 2015-01-02 ENCOUNTER — Ambulatory Visit
Admission: RE | Admit: 2015-01-02 | Discharge: 2015-01-02 | Disposition: A | Discharge status: Home / Self Care | Payer: PRIVATE HEALTH INSURANCE | Source: Ambulatory Visit | Attending: Neurology | Admitting: Neurology

## 2015-01-02 ENCOUNTER — Telehealth: Payer: Self-pay | Admitting: Neurology

## 2015-01-02 ENCOUNTER — Other Ambulatory Visit: Payer: Self-pay | Admitting: Neurology

## 2015-01-02 DIAGNOSIS — G932 Benign intracranial hypertension: Secondary | ICD-10-CM

## 2015-01-02 LAB — CSF CELL COUNT WITH DIFFERENTIAL
RBC Count, CSF: 0 cu mm
Tube #: 3
WBC, CSF: 1 cu mm (ref 0–5)

## 2015-01-02 LAB — PROTEIN, CSF: Total Protein, CSF: 34 mg/dL (ref 15–45)

## 2015-01-02 LAB — GLUCOSE, CSF: Glucose, CSF: 53 mg/dL (ref 43–76)

## 2015-01-02 MED ORDER — ACETAZOLAMIDE ER 500 MG PO CP12: 500.0000 mg | ORAL_CAPSULE | Freq: Two times a day (BID) | ORAL | Status: DC

## 2015-01-02 MED ORDER — TRAMADOL HCL 50 MG PO TABS: 100.0000 mg | ORAL_TABLET | Freq: Four times a day (QID) | ORAL | Status: DC | PRN

## 2015-01-02 NOTE — Telephone Encounter (Signed)
Discuess elevated OP with patient, will start on diamox. Temporary Tramadol for LP pain

## 2015-01-02 NOTE — Progress Notes (Signed)
Discharge instructions explained to pt and her boyfriend.

## 2015-01-02 NOTE — Discharge Instructions (Signed)

## 2015-01-04 DIAGNOSIS — G932 Benign intracranial hypertension: Secondary | ICD-10-CM | POA: Insufficient documentation

## 2015-01-04 DIAGNOSIS — R51 Headache: Secondary | ICD-10-CM

## 2015-01-04 DIAGNOSIS — H4710 Unspecified papilledema: Secondary | ICD-10-CM | POA: Insufficient documentation

## 2015-01-04 DIAGNOSIS — G939 Disorder of brain, unspecified: Secondary | ICD-10-CM | POA: Insufficient documentation

## 2015-01-04 DIAGNOSIS — R519 Headache, unspecified: Secondary | ICD-10-CM

## 2015-01-04 HISTORY — DX: Headache, unspecified: R51.9

## 2015-01-07 ENCOUNTER — Telehealth: Payer: Self-pay | Admitting: Neurology

## 2015-01-07 NOTE — Telephone Encounter (Signed)
Patient wants to know if she is allowed to drink alcohol while on Diamox. Best call back is [phone].

## 2015-01-07 NOTE — Telephone Encounter (Signed)
Called pt back to relay Dr. Cathren Laine recommendation. No answer, left a message asking her to call me back.

## 2015-01-07 NOTE — Telephone Encounter (Signed)
I advised pt to not drink more than 2 drinks a day or 7 total in a week. Pt verbalized understanding.

## 2015-01-07 NOTE — Telephone Encounter (Signed)
Patient returned call. Please call and advise.  °

## 2015-01-07 NOTE — Telephone Encounter (Signed)
In moderation is fine. No more than 2 drinks a day or total 7 in a week. Please let patient know. Thanks!

## 2015-01-09 ENCOUNTER — Ambulatory Visit (INDEPENDENT_AMBULATORY_CARE_PROVIDER_SITE_OTHER): Payer: PRIVATE HEALTH INSURANCE | Admitting: Neurology

## 2015-01-09 DIAGNOSIS — G939 Disorder of brain, unspecified: Secondary | ICD-10-CM

## 2015-01-09 NOTE — Procedures (Signed)
    History:  Diana Page is a 24 year old patient with a history of headaches, dizziness, and papilledema. The patient has hippocampal abnormalities on MRI of the brain. The patient is being evaluated for possible seizure-type episodes.  This is a routine EEG. No skull defects are noted. Medications include Diamox, Xanax, multivitamins, Symbyax, oxycodone, and tramadol.   EEG classification: Normal awake  Description of the recording: The background rhythms of this recording consists of a fairly well modulated medium amplitude alpha rhythm of 9 Hz that is reactive to eye opening and closure. As the record progresses, the patient appears to remain in the waking state throughout the recording. Photic stimulation was performed, resulting in a bilateral and symmetric photic driving response. Hyperventilation was also performed, resulting in a minimal buildup of the background rhythm activities without significant slowing seen. At no time during the recording does there appear to be evidence of spike or spike wave discharges or evidence of focal slowing. EKG monitor shows no evidence of cardiac rhythm abnormalities with a heart rate of 78.  Impression: This is a normal EEG recording in the waking state. No evidence of ictal or interictal discharges are seen.

## 2015-01-12 ENCOUNTER — Telehealth: Payer: Self-pay

## 2015-01-12 NOTE — Telephone Encounter (Signed)
-----   Message from [name], MD sent at 01/09/2015  3:46 PM EDT ----- Please let patient know her eeg was normal thank you

## 2015-01-12 NOTE — Telephone Encounter (Signed)
I spoke to patient and gave results. She voices understanding.

## 2015-01-16 ENCOUNTER — Ambulatory Visit
Admission: RE | Admit: 2015-01-16 | Discharge: 2015-01-16 | Disposition: A | Discharge status: Home / Self Care | Payer: PRIVATE HEALTH INSURANCE | Source: Ambulatory Visit | Attending: Neurology | Admitting: Neurology

## 2015-01-16 DIAGNOSIS — H4710 Unspecified papilledema: Secondary | ICD-10-CM | POA: Diagnosis not present

## 2015-01-16 DIAGNOSIS — G932 Benign intracranial hypertension: Secondary | ICD-10-CM

## 2015-01-16 DIAGNOSIS — G939 Disorder of brain, unspecified: Secondary | ICD-10-CM

## 2015-01-16 DIAGNOSIS — G4489 Other headache syndrome: Secondary | ICD-10-CM | POA: Diagnosis not present

## 2015-01-16 MED ORDER — GADOBENATE DIMEGLUMINE 529 MG/ML IV SOLN
19.0000 mL | Freq: Once | INTRAVENOUS | Status: AC | PRN
  Administered 2015-01-16: 19 mL via INTRAVENOUS

## 2015-01-19 ENCOUNTER — Telehealth: Payer: Self-pay | Admitting: Neurology

## 2015-01-19 NOTE — Telephone Encounter (Signed)
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 She verified she was taking diamox and LP had elevated opening pressure. Signal in temporal lobes still there but stable. Initially worried for possible infectious or limbic encephalopathy, but stability and absence of clinical symptoms make it less likely. Dr. Jaynee Eagles is going to keep her on Diamox and repeat MRI brain in 6 months unless she has new symptoms. Told pt to call the office if this occurs. Pt verbalized understanding.  Pt stated she had 2 dizzy spells in the last week lasting no longer than 30 seconds. Understands this is a side effect of Diamox and having an ileostomy can put her at risk for dehydration and can lead to dizziness. Told her I would let Dr. Jaynee Eagles know and call her back if needed.

## 2015-01-19 NOTE — Telephone Encounter (Signed)
Thanks emma.

## 2015-01-19 NOTE — Telephone Encounter (Signed)
Left message for patient. 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 The signal in the temporal lobes os still there but stable. Although initially worrisome for an infectious or limbic encephalopathy, the stability and the absence of specific clinical symptoms make this less likely. We will keep her on the diamox and repeat in about  6 months unless she has new symptoms. thanks

## 2015-01-20 ENCOUNTER — Encounter (HOSPITAL_COMMUNITY): Payer: Self-pay | Admitting: Psychiatry

## 2015-01-20 ENCOUNTER — Ambulatory Visit (INDEPENDENT_AMBULATORY_CARE_PROVIDER_SITE_OTHER): Payer: Self-pay | Admitting: Psychiatry

## 2015-01-20 VITALS — BP 114/73 | HR 76 | Ht 62.0 in | Wt 204.8 lb

## 2015-01-20 DIAGNOSIS — F42 Obsessive-compulsive disorder: Secondary | ICD-10-CM

## 2015-01-20 DIAGNOSIS — F429 Obsessive-compulsive disorder, unspecified: Secondary | ICD-10-CM

## 2015-01-20 DIAGNOSIS — F329 Major depressive disorder, single episode, unspecified: Secondary | ICD-10-CM

## 2015-01-20 MED ORDER — OLANZAPINE-FLUOXETINE HCL 3-25 MG PO CAPS
1.0000 | ORAL_CAPSULE | Freq: Every day | ORAL | Status: DC
Start: 2015-01-20 — End: 2015-04-27

## 2015-01-20 NOTE — Progress Notes (Signed)
[place] [phone] Progress Note  Diana Page [id number] 24 y.o.  01/20/2015 2:29 PM  Chief Complaint:  I have gone through a lot in past few weeks.  I was diagnosed with intracranial hypertension.  I'm taking Diamox.    History of Present Illness:  Diana Page came for her follow-up appointment.  She is 24 year old Caucasian single unemployed female who was seen first time as initial evaluation on June 10.  She was self-referred for the management of OCD symptoms.  She was taking Symbyax 3/25 mg for her symptoms.  Patient endorse an past few weeks she has lot of drama in her health.  She remember having vision issues and she was seen by ophthalmologist who recommended MRI and cardiologist found lesion in his eye and is strongly encouraged her to go ER.  She has a lot of past including MRI, EEG, blood test and finally she has lumbar puncture and she was diagnosed with increased intracranial pressure.  She was not happy with her ophthalmologist and radiologist.  She is pleased that finally she is getting Diamox to help her intracranial pressure.  She admitted her vision is better.  She denies any dizziness, pounding sensation in her head.  Initially she was feeling sad depressed but now she is more calm.  She admitted using Xanax left over which is prescribed by her previous physician.  She does not want to change her medication because she believes Symbyax working good.  She has no tremors, shakes or any side effects.  She still have some time intrusive thoughts but denies any crying spells, irritability, feeling of hopelessness or worthlessness.  She is talking to her therapist every week on the phone who lives in [place].  She's spanning to visit Diana Page next week and she will visit him in person.  Patient reported good relationship with her boyfriend.  She denies any paranoia, hallucination or any suicidal thoughts.  Patient denies drinking or using any illegal substances.  Her energy  level is good.  She sleeping 7-8 hours.  Her appetite is okay.  Her vitals are stable.  Patient lives with her boyfriend was very supportive.    Suicidal Ideation: No Plan Formed: No Patient has means to carry out plan: No  Homicidal Ideation: No Plan Formed: No Patient has means to carry out plan: No  Past Psychiatric History/Hospitalization(s): Patient endorse history of anxiety most of her life however in past 2 years symptoms started to get worse.  She started counseling and diagnosed with OCD.  Initially she tried Prozac low dose but she did not like it and then her psychiatrist Dr. Leilani Merl in [place] started her on Symbyax.  Patient had a good response with this medication.  Patient denies any hallucination, paranoia, mood swing, agitation or any anger.  She denies any history of suicidal attempt or any inpatient psychiatric treatment. Anxiety: Yes Bipolar Disorder: No Depression: Yes Mania: No Psychosis: No Schizophrenia: No Personality Disorder: No Hospitalization for psychiatric illness: No History of Electroconvulsive Shock Therapy: No Prior Suicide Attempts: No  Medical History; Patient recently diagnosed with intracranial hypertension .  She has chronic inflammatory bowel disease and in March 2016 she has ileostomy and she has ileostomy bag.  She has history of anemia, pancreatitis and history of cholecystectomy.  She see Dr. Saralyn Pilar hung for her GI issues.  Her neurologist is Dr. Acie Fredrickson.   Review of Systems  Constitutional: Negative for weight loss.  Skin: Negative for itching and rash.  Neurological:  Negative for dizziness, tremors and headaches.  Psychiatric/Behavioral: The patient is nervous/anxious.     Psychiatric: Agitation: No Hallucination: No Depressed Mood: No Insomnia: No Hypersomnia: No Altered Concentration: No Feels Worthless: No Grandiose Ideas: No Belief In Special Powers: No New/Increased Substance Abuse: No Compulsions: Obsessive  thoughts while she drives that she may hit the person  Neurologic: Headache: No Seizure: No Paresthesias: No   Outpatient Encounter Prescriptions as of 01/20/2015  Medication Sig  . acetaZOLAMIDE (DIAMOX) 500 MG capsule Take 1 capsule (500 mg total) by mouth 2 (two) times daily.  Marland Kitchen ALPRAZolam (XANAX) 0.25 MG tablet Take 0.25 mg by mouth daily as needed for anxiety.   . Multiple Vitamin (MULTIVITAMIN WITH MINERALS) TABS tablet Take 1 tablet by mouth daily at 12 noon.  Marland Kitchen OLANZapine-FLUoxetine (SYMBYAX) 3-25 MG per capsule Take 1 capsule by mouth at bedtime.  . [DISCONTINUED] OLANZapine-FLUoxetine (SYMBYAX) 3-25 MG per capsule Take 1 capsule by mouth every evening. (Patient taking differently: Take 1 capsule by mouth at bedtime. )  . [DISCONTINUED] oxyCODONE (OXY IR/ROXICODONE) 5 MG immediate release tablet Take 1 tablet (5 mg total) by mouth every 6 (six) hours as needed for severe pain. (Patient taking differently: Take 5 mg by mouth daily as needed for severe pain (pain). )  . [DISCONTINUED] traMADol (ULTRAM) 50 MG tablet Take 2 tablets (100 mg total) by mouth every 6 (six) hours as needed.   No facility-administered encounter medications on file as of 01/20/2015.    Recent Results (from the past 2160 hour(s))  Sample to Blood Bank     Status: None   Collection Time: 12/26/14 11:08 PM  Result Value Ref Range   Blood Bank Specimen SAMPLE AVAILABLE FOR TESTING    Sample Expiration 12/27/2014   CBC with Differential     Status: Abnormal   Collection Time: 12/26/14 11:10 PM  Result Value Ref Range   WBC 9.4 4.0 - 10.5 K/uL   RBC 4.40 3.87 - 5.11 MIL/uL   Hemoglobin 9.6 (L) 12.0 - 15.0 g/dL   HCT 30.8 (L) 36.0 - 46.0 %   MCV 70.0 (L) 78.0 - 100.0 fL   MCH 21.8 (L) 26.0 - 34.0 pg   MCHC 31.2 30.0 - 36.0 g/dL   RDW 15.6 (H) 11.5 - 15.5 %   Platelets 386 150 - 400 K/uL   Neutrophils Relative % 60 43 - 77 %   Neutro Abs 5.6 1.7 - 7.7 K/uL   Lymphocytes Relative 33 12 - 46 %   Lymphs  Abs 3.1 0.7 - 4.0 K/uL   Monocytes Relative 5 3 - 12 %   Monocytes Absolute 0.5 0.1 - 1.0 K/uL   Eosinophils Relative 2 0 - 5 %   Eosinophils Absolute 0.2 0.0 - 0.7 K/uL   Basophils Relative 0 0 - 1 %   Basophils Absolute 0.0 0.0 - 0.1 K/uL  Comprehensive metabolic panel     Status: Abnormal   Collection Time: 12/26/14 11:10 PM  Result Value Ref Range   Sodium 136 135 - 145 mmol/L   Potassium 3.9 3.5 - 5.1 mmol/L   Chloride 103 101 - 111 mmol/L   CO2 24 22 - 32 mmol/L   Glucose, Bld 104 (H) 65 - 99 mg/dL   BUN 11 6 - 20 mg/dL   Creatinine, Ser 0.85 0.44 - 1.00 mg/dL   Calcium 9.0 8.9 - 10.3 mg/dL   Total Protein 7.9 6.5 - 8.1 g/dL   Albumin 3.7 3.5 - 5.0 g/dL  AST 36 15 - 41 U/L   ALT 37 14 - 54 U/L   Alkaline Phosphatase 143 (H) 38 - 126 U/L   Total Bilirubin 0.3 0.3 - 1.2 mg/dL   GFR calc non Af Amer >60 >60 mL/min   GFR calc Af Amer >60 >60 mL/min    Comment: (NOTE) The eGFR has been calculated using the CKD EPI equation. This calculation has not been validated in all clinical situations. eGFR's persistently <60 mL/min signify possible Chronic Kidney Disease.    Anion gap 9 5 - 15  Urinalysis, Routine w reflex microscopic (not at [facility])     Status: None   Collection Time: 12/27/14 12:03 AM  Result Value Ref Range   Color, Urine YELLOW YELLOW   APPearance CLEAR CLEAR   Specific Gravity, Urine 1.006 1.005 - 1.030   pH 6.5 5.0 - 8.0   Glucose, UA NEGATIVE NEGATIVE mg/dL   Hgb urine dipstick NEGATIVE NEGATIVE   Bilirubin Urine NEGATIVE NEGATIVE   Ketones, ur NEGATIVE NEGATIVE mg/dL   Protein, ur NEGATIVE NEGATIVE mg/dL   Urobilinogen, UA 0.2 0.0 - 1.0 mg/dL   Nitrite NEGATIVE NEGATIVE   Leukocytes, UA NEGATIVE NEGATIVE    Comment: MICROSCOPIC NOT DONE ON URINES WITH NEGATIVE PROTEIN, BLOOD, LEUKOCYTES, NITRITE, OR GLUCOSE <1000 mg/dL.  POC Urine Pregnancy, ED  (If Pre-menopausal female)  not at [facility]     Status: None   Collection Time: 12/27/14 12:17 AM  Result Value  Ref Range   Preg Test, Ur NEGATIVE NEGATIVE    Comment:        THE SENSITIVITY OF THIS METHODOLOGY IS >24 mIU/mL   CBC     Status: Abnormal   Collection Time: 12/27/14  5:50 PM  Result Value Ref Range   WBC 7.5 4.0 - 10.5 K/uL   RBC 4.42 3.87 - 5.11 MIL/uL   Hemoglobin 9.3 (L) 12.0 - 15.0 g/dL   HCT 30.8 (L) 36.0 - 46.0 %   MCV 69.7 (L) 78.0 - 100.0 fL   MCH 21.0 (L) 26.0 - 34.0 pg   MCHC 30.2 30.0 - 36.0 g/dL   RDW 15.5 11.5 - 15.5 %   Platelets 387 150 - 400 K/uL  Basic metabolic panel     Status: Abnormal   Collection Time: 12/27/14  5:50 PM  Result Value Ref Range   Sodium 139 135 - 145 mmol/L   Potassium 3.5 3.5 - 5.1 mmol/L   Chloride 107 101 - 111 mmol/L   CO2 25 22 - 32 mmol/L   Glucose, Bld 106 (H) 65 - 99 mg/dL   BUN 9 6 - 20 mg/dL   Creatinine, Ser 0.86 0.44 - 1.00 mg/dL   Calcium 9.1 8.9 - 10.3 mg/dL   GFR calc non Af Amer >60 >60 mL/min   GFR calc Af Amer >60 >60 mL/min    Comment: (NOTE) The eGFR has been calculated using the CKD EPI equation. This calculation has not been validated in all clinical situations. eGFR's persistently <60 mL/min signify possible Chronic Kidney Disease.    Anion gap 7 5 - 15  Herpes simplex virus(hsv) dna by pcr     Status: None   Collection Time: 12/27/14  8:30 PM  Result Value Ref Range   HSV 1 DNA Negative Negative   HSV 2 DNA Negative Negative    Comment: (NOTE) This test was developed and its performance characteristics determined by [employer]. It has not been cleared or approved by the U.S. Food and Drug  Administration. The FDA has determined that such clearance or approval is not necessary. This test is used for clinical purposes. It should not be regarded as investigational or research. Performed At: [facility] [place], [place] [id number] Lindon Romp MD [id number]   CSF cell count with differential     Status: Abnormal   Collection Time: 12/27/14  8:58 PM  Result Value  Ref Range   Tube # 1    Color, CSF PINK (A) COLORLESS   Appearance, CSF CLEAR CLEAR   Supernatant CLEAR    RBC Count, CSF 1380 (H) 0 /cu mm   WBC, CSF 1 0 - 5 /cu mm   Segmented Neutrophils-CSF RARE 0 - 6 %   Other Cells, CSF TOO FEW TO COUNT, SMEAR AVAILABLE FOR REVIEW   Protein and glucose, CSF     Status: None   Collection Time: 12/27/14  8:58 PM  Result Value Ref Range   Glucose, CSF 57 40 - 70 mg/dL   Total  Protein, CSF 25 15 - 45 mg/dL  CSF culture     Status: None   Collection Time: 12/27/14  8:58 PM  Result Value Ref Range   Specimen Description CSF    Special Requests NONE    Gram Stain NO WBC SEEN NO ORGANISMS SEEN CYTOSPIN SMEAR     Culture NO GROWTH 3 DAYS    Report Status 12/31/2014 FINAL   VDRL, CSF     Status: None   Collection Time: 12/27/14  8:58 PM  Result Value Ref Range   VDRL Quant, CSF Non Reactive Non Rea:<1:1    Comment: (NOTE) Performed At: [facility] [place], [place] [id number] Lindon Romp MD [id number]   T4, free     Status: None   Collection Time: 12/28/14  6:25 AM  Result Value Ref Range   Free T4 0.83 0.61 - 1.12 ng/dL  Vitamin B12     Status: None   Collection Time: 12/28/14  6:25 AM  Result Value Ref Range   Vitamin B-12 651 180 - 914 pg/mL    Comment: (NOTE) This assay is not validated for testing neonatal or myeloproliferative syndrome specimens for Vitamin B12 levels.   Vitamin B1     Status: None   Collection Time: 12/28/14  6:25 AM  Result Value Ref Range   Vitamin B1 (Thiamine) 136.9 66.5 - 200.0 nmol/L    Comment: (NOTE) Performed At: [facility] [place], [place] [id number] Lindon Romp MD [id number]   Sedimentation rate     Status: Abnormal   Collection Time: 12/28/14  6:25 AM  Result Value Ref Range   Sed Rate 47 (H) 0 - 22 mm/hr  C-reactive protein     Status: Abnormal   Collection Time: 12/28/14  6:25 AM  Result Value Ref Range   CRP 1.5 (H) <1.0  mg/dL  Folate     Status: None   Collection Time: 12/28/14  6:25 AM  Result Value Ref Range   Folate 23.3 >5.9 ng/mL  Heavy metals, blood     Status: None   Collection Time: 12/28/14  6:25 AM  Result Value Ref Range   Arsenic 15 2 - 23 ug/L    Comment:                                 Detection Limit = 1   Mercury  1.8 0.0 - 14.9 ug/L    Comment: (NOTE)                        Environmental Exposure:  <15.0                        Occupational Exposure:                         BEI - Inorganic Mercury: 15.0                                Detection Limit =  1.0 Performed At: [facility] [place], [place] [id number] Lindon Romp MD [id number]    Lead None Detected 0 - 19 ug/dL    Comment: (NOTE)                          Environmental Exposure:                           WHO Recommendation    <20                          Occupational Exposure:                           OSHA Lead Std          40                           BEI                    30                                Detection Limit =  1   HIV antibody     Status: None   Collection Time: 12/28/14  6:25 AM  Result Value Ref Range   HIV Screen 4th Generation wRfx Non Reactive Non Reactive    Comment: (NOTE) Performed At: [facility] [place], [place] [id number] Lindon Romp MD [id number]   Protime-INR     Status: None   Collection Time: 12/28/14  6:25 AM  Result Value Ref Range   Prothrombin Time 14.5 11.6 - 15.2 seconds   INR 1.11 0.00 - 1.49  APTT     Status: None   Collection Time: 12/28/14  6:25 AM  Result Value Ref Range   aPTT 27 24 - 37 seconds  Comprehensive metabolic panel     Status: Abnormal   Collection Time: 12/28/14  6:25 AM  Result Value Ref Range   Sodium 139 135 - 145 mmol/L   Potassium 4.3 3.5 - 5.1 mmol/L   Chloride 109 101 - 111 mmol/L   CO2 22 22 - 32 mmol/L   Glucose, Bld 90 65 - 99 mg/dL   BUN 7 6 - 20 mg/dL   Creatinine, Ser 0.81  0.44 - 1.00 mg/dL   Calcium 8.9 8.9 - 10.3 mg/dL   Total Protein 7.1 6.5 - 8.1 g/dL   Albumin 3.3 (L) 3.5 - 5.0 g/dL   AST 33  15 - 41 U/L   ALT 31 14 - 54 U/L   Alkaline Phosphatase 131 (H) 38 - 126 U/L   Total Bilirubin 0.1 (L) 0.3 - 1.2 mg/dL   GFR calc non Af Amer >60 >60 mL/min   GFR calc Af Amer >60 >60 mL/min    Comment: (NOTE) The eGFR has been calculated using the CKD EPI equation. This calculation has not been validated in all clinical situations. eGFR's persistently <60 mL/min signify possible Chronic Kidney Disease.    Anion gap 8 5 - 15  CBC     Status: Abnormal   Collection Time: 12/28/14  6:25 AM  Result Value Ref Range   WBC 7.4 4.0 - 10.5 K/uL   RBC 4.47 3.87 - 5.11 MIL/uL   Hemoglobin 9.5 (L) 12.0 - 15.0 g/dL   HCT 31.2 (L) 36.0 - 46.0 %   MCV 69.8 (L) 78.0 - 100.0 fL   MCH 21.3 (L) 26.0 - 34.0 pg   MCHC 30.4 30.0 - 36.0 g/dL   RDW 15.8 (H) 11.5 - 15.5 %   Platelets 369 150 - 400 K/uL  Magnesium     Status: None   Collection Time: 12/28/14  6:25 AM  Result Value Ref Range   Magnesium 2.0 1.7 - 2.4 mg/dL  Phosphorus     Status: Abnormal   Collection Time: 12/28/14  6:25 AM  Result Value Ref Range   Phosphorus 4.7 (H) 2.5 - 4.6 mg/dL  TSH     Status: None   Collection Time: 12/28/14  6:45 AM  Result Value Ref Range   TSH 2.921 0.350 - 4.500 uIU/mL  Ammonia     Status: None   Collection Time: 12/28/14  6:45 AM  Result Value Ref Range   Ammonia 29 9 - 35 umol/L  Cortisol     Status: None   Collection Time: 12/28/14  6:45 AM  Result Value Ref Range   Cortisol, Plasma 0.9 ug/dL    Comment: (NOTE) AM    6.7 - 22.6 ug/dL PM   <10.0       ug/dL   Glucose, capillary     Status: Abnormal   Collection Time: 12/28/14  8:22 AM  Result Value Ref Range   Glucose-Capillary 100 (H) 65 - 99 mg/dL  CSF cell count with differential     Status: None   Collection Time: 01/02/15 12:03 PM  Result Value Ref Range   Tube # 3    Color, CSF COLORLESS COLORLESS    Appearance, CSF CLEAR CLEAR   Supernatant NOT INDICATED COLORLESS   WBC, CSF 1 0 - 5 cu mm   RBC Count, CSF 0 0 cu mm   Segmented Neutrophils-CSF NOT PERFORMED 0 - 6 %   Lymphs, CSF NOT PERFORMED 40 - 80 %   Monocyte/Macrophage NOT PERFORMED 15 - 45 %   Eosinophils, CSF NOT PERFORMED 0 - 1 %   Other Cells, CSF SEE NOTE     Comment: Too few to count, smear available for review RARE LYMPH AND MONO SEEN ON MICROSCOPIC REVIEW   Glucose, CSF     Status: None   Collection Time: 01/02/15 12:03 PM  Result Value Ref Range   Glucose, CSF 53 43 - 76 mg/dL  Protein, CSF     Status: None   Collection Time: 01/02/15 12:03 PM  Result Value Ref Range   Total Protein, CSF 34 15 - 45 mg/dL      Constitutional:  BP 114/73 mmHg  Pulse 76  Ht _0  (1.575 m)  Wt 204 lb 12.8 oz (92.897 kg)  BMI 37.45 kg/m2  LMP 12/15/2014 (Approximate)   Musculoskeletal: Strength & Muscle Tone: within normal limits Gait & Station: normal Patient leans: N/A  Psychiatric Specialty Exam: General Appearance: Casual  Eye Contact::  Good  Speech:  Slow  Volume:  Normal  Mood:  Anxious  Affect:  Congruent  Thought Process:  Coherent  Orientation:  Full (Time, Place, and Person)  Thought Content:  Obsessions  Suicidal Thoughts:  No  Homicidal Thoughts:  No  Memory:  Immediate;   Good Recent;   Good Remote;   Good  Judgement:  Good  Insight:  Good  Psychomotor Activity:  Normal  Concentration:  Fair  Recall:  [place] of Knowledge:  Good  Language:  Good  Akathisia:  No  Handed:  Right  AIMS (if indicated):     Assets:  Communication Skills Desire for Improvement Financial Resources/Insurance Housing Social Support Transportation  ADL's:  Intact  Cognition:  WNL  Sleep:        Established Problem, Stable/Improving (1), Review of Psycho-Social Stressors (1), Review or order clinical lab tests (1), Review and summation of old records (2), Review of Last Therapy Session (1), Independent  Review of image, tracing or specimen (2) and Review of New Medication or Change in Dosage (2)  Assessment: Axis I: Obsessive-compulsive disorder.  Major depressive disorder recurrent mild  Axis II: Deferred  Axis III:  Past Medical History  Diagnosis Date  . UC (ulcerative colitis)   . Pancreatitis   . Pancreatitis     hx of   . Obsessive compulsive disorder 06/14     Plan:  I review her collateral information including recent blood work results, neurology notes and emergency room visits.  Her thyroid and basic chemistry is normal.  Despite recent health concerns she is pretty stable on her current psychiatric medication.  She wants to continue Symbyax 3/25 mg every night.  She is getting Xanax from other provider which she takes 0.25 mg as needed.  We are still awaiting records from her previous psychiatrist Dr. Leilani Merl from [place].  Recommended to call us back if she has any question or any concern.  She had lost weight from the past , discuss sleep hygiene and weight maintenance and recommended to watch her calorie intake and to regular exercise.   Discuss safety plan that anytime having active suicidal thoughts or homicidal thoughts and she need to call 911 or go to the local emergency room.  Time spent 25 minutes.    Taffie Eckmann T., MD 01/20/2015

## 2015-01-28 ENCOUNTER — Other Ambulatory Visit (HOSPITAL_COMMUNITY): Payer: Self-pay | Admitting: Psychiatry

## 2015-01-28 NOTE — Telephone Encounter (Signed)
Received refill request from [facility] for Olanzapine-Fluoxetine (Symbyax). PT was seen by Dr. Adele Schilder on 01/20/15. Prescription was sent to [facility]. PT has a f/u appt on 04/22/15.

## 2015-04-02 ENCOUNTER — Encounter: Payer: Self-pay | Admitting: Neurology

## 2015-04-02 ENCOUNTER — Ambulatory Visit (INDEPENDENT_AMBULATORY_CARE_PROVIDER_SITE_OTHER): Payer: PRIVATE HEALTH INSURANCE | Admitting: Neurology

## 2015-04-02 VITALS — BP 106/63 | HR 77 | Ht 62.0 in | Wt 202.4 lb

## 2015-04-02 DIAGNOSIS — G932 Benign intracranial hypertension: Secondary | ICD-10-CM | POA: Diagnosis not present

## 2015-04-02 DIAGNOSIS — G939 Disorder of brain, unspecified: Secondary | ICD-10-CM | POA: Diagnosis not present

## 2015-04-02 MED ORDER — ACETAZOLAMIDE ER 500 MG PO CP12: 500.0000 mg | ORAL_CAPSULE | Freq: Three times a day (TID) | ORAL | Status: DC

## 2015-04-02 NOTE — Progress Notes (Signed)
[name]  CC: Headache and vision loss, dizziness  Interval update: Opening pressure of LP was 34.5. 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 the thumping and pressure in her head have improved. The peripheral vision changes are gone, no headaches. She needs follow up with ophthalmology. She needs an ophthalmologist to follow in the area, will refer to [name]. She denies any current side effects from the diamox.   HPI: Diana Page is a 24 y.o. female here as a referral from Dr. Jeanie Cooks for dizziness.  PMHx obesity. She was seen in the ED for acute onset headache and dizziness with decreased peripheral vision. She had an abnormal MRI and was called back to the ED for lumbar puncture which showed negative/normal CSF analysis. Unfortunately the LP did not include an opening pressure. She felt much better after the LP however. Currently if she stands up, she gets a thumping in the head. She gets stars in her right > Left peripheral vision. She gets pressure in the head all over. If she lays on her stomach, the pressure is worse and she can feel her heartbeat in her head. She went to optometry who diagnosed edema of the optic disks. Right eye has blurry vision which is persistent. She has had severe headaches. The dizziness has resolved after the LP. Symptoms felt much better after LP. Denies seizure-like events, staring, altered awareness. Symptoms are daily. No other associated focal neurologic deficits. No neck pain.   Reviewed notes, labs and imaging from outside physicians, which showed: MRi brain 12/27/2014: Personally reviewed images: 1. Abnormal signal within the medial temporal lobes bilaterally predominantly involving the hippocampal structures. This is  concerning for infectious process including herpes encephalitis. Other viral encephalitides can give a similar picture. Limbic encephalitis can look like this, but is typically associated with lung cancer. Alternatively, this could be related to seizure activity. The patient has no history of seizures. 2. Question abnormal signal within the anterior aspect of the right optic nerve, potentially related to same process.   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 Widening of the optic nerve sheath. This can be observed with intracranial hypertension, and is commonly seen with pseudotumor cerebri. 2. The subtle signal changes in the mesial temporal lobes appear unchanged. Although initially worrisome for an infectious or limbic encephalopathy, the stability and the absence of specific clinical symptoms make this less likely. If clinically indicated, consider follow-up evaluation or follow-up CSF analysis  Labs:   CSF: protein, glucose, diff, cell count normal TSH, B12, B1, HIV wnl CRP, ESR elevated CMP unremarkable CBC w/ anemia   Review of Systems: Patient complains of symptoms per HPI as well as the following symptoms: No CP, no SOB. Pertinent negatives per HPI. All others negative.   Social History   Social History  . Marital Status: Single    Spouse Name: N/A  . Number of Children: 0  . Years of Education: Ba   Occupational History  . Not on file.   Social History Main  Topics  . Smoking status: Former Smoker    Quit date: 06/27/2009  . Smokeless tobacco: Never Used  . Alcohol Use: No  . Drug Use: No  . Sexual Activity: Not on file   Other  Topics Concern  . Not on file   Social History Narrative   Lives at home with boyfriend, Lennette Bihari.   Caffeine use: Tea occass    Family History  Problem Relation Age of Onset  . OCD Mother   . OCD Sister   . OCD Brother     Past Medical History  Diagnosis Date  . UC (ulcerative colitis) ([place])   . Pancreatitis   . Pancreatitis     hx of   . Obsessive compulsive disorder 06/14    Past Surgical History  Procedure Laterality Date  . Cholecystectomy    . Permanent ileostomy      Current Outpatient Prescriptions  Medication Sig Dispense Refill  . acetaZOLAMIDE (DIAMOX) 500 MG capsule Take 1 capsule (500 mg total) by mouth 2 (two) times daily. 60 capsule 11  . ALPRAZolam (XANAX) 0.25 MG tablet Take 0.25 mg by mouth daily as needed for anxiety.     . Multiple Vitamin (MULTIVITAMIN WITH MINERALS) TABS tablet Take 1 tablet by mouth daily at 12 noon.    Marland Kitchen OLANZapine-FLUoxetine (SYMBYAX) 3-25 MG per capsule Take 1 capsule by mouth at bedtime. 30 capsule 2   No current facility-administered medications for this visit.    Allergies as of 04/02/2015  . (No Known Allergies)    Vitals: BP 106/63 mmHg  Pulse 77  Ht 5' 2"  (1.575 m)  Wt 202 lb 6.4 oz (91.808 kg)  BMI 37.01 kg/m2 Last Weight:  Wt Readings from Last 1 Encounters:  04/02/15 202 lb 6.4 oz (91.808 kg)   Last Height:   Ht Readings from Last 1 Encounters:  04/02/15 5' 2"  (1.575 m)   Neuro: Detailed Neurologic Exam  Speech:  Speech is normal; fluent and spontaneous with normal comprehension.  Cognition:  The patient is oriented to person, place, and time;  Cranial Nerves:  The pupils are equal, round, and reactive to light. No APD, R>L disk edema. Visual fields are full to finger confrontation. Extraocular movements are intact. Trigeminal sensation is intact and the muscles of mastication are normal. The face is symmetric. The palate elevates in the midline. Hearing intact. Voice is normal. Shoulder shrug  is normal. The tongue has normal motion without fasciculations.   Motor Observation:  No asymmetry, no atrophy, and no involuntary movements noted. Tone:  Normal muscle tone.   Posture:  Posture is normal. normal erect   Strength:  Strength is V/V in the upper and lower limbs.     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  Opening pressure was 34.5, improved on diamox. Abnormal MRI with abnormal signal within the medial temporal lobes bilaterally predominantly involving the hippocampal structures. 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 Repeat MRi showed subtle signal changes in the mesial temporal lobes unchanged, although initially worrisome for an infectious or limbic encephalopathy, the stability and the absence of specific clinical symptoms make this less likely.   Will order LP with opening pressure.-  was 34.5 opening  pressure c/w IIH Will repeat MRI of the brain w/wo contrast -stable, will repeat 3 months F/u in 4 weeks  Needs ophthalmology follow up will refer to [facility].  Will increase diamox to 549m three times a day   ASarina Ill MD  [facility] [address]lenvarGBellwood [place] [address] Phone [phone] [phone]  A total of 348mutes was spent face-to-face with this patient. Over half this time was spent on counseling patient on the IIH diagnosis and different diagnostic and therapeutic options available.

## 2015-04-02 NOTE — Patient Instructions (Signed)
Overall you are doing fairly well but I do want to suggest a few things today:   Remember to drink plenty of fluid, eat healthy meals and do not skip any meals. Try to eat protein with a every meal and eat a healthy snack such as fruit or nuts in between meals. Try to keep a regular sleep-wake schedule and try to exercise daily, particularly in the form of walking, 20-30 minutes a day, if you can.   As far as your medications are concerned, I would like to suggest: Acetazolamide 527m three times a day  As far as diagnostic testing: MRI of the brain  I would like to see you back in 6 weeks, sooner if we need to. Please call [facility] any interim questions, concerns, problems, updates or refill requests.   Our phone number is [phone] We also have an after hours call service for urgent matters and there is a physician on-call for urgent questions. For any emergencies you know to call 911 or go to the nearest emergency room

## 2015-04-02 NOTE — Addendum Note (Signed)
Addended by: Sarina Ill B on: 04/02/2015 09:45 PM   Modules accepted: Orders

## 2015-04-03 ENCOUNTER — Telehealth: Payer: Self-pay | Admitting: *Deleted

## 2015-04-03 LAB — COMPREHENSIVE METABOLIC PANEL
ALT: 21 IU/L (ref 0–32)
AST: 19 IU/L (ref 0–40)
Albumin/Globulin Ratio: 1.3 (ref 1.1–2.5)
Albumin: 4.3 g/dL (ref 3.5–5.5)
Alkaline Phosphatase: 207 IU/L — ABNORMAL HIGH (ref 39–117)
BUN/Creatinine Ratio: 14 (ref 8–20)
BUN: 12 mg/dL (ref 6–20)
Bilirubin Total: 0.2 mg/dL (ref 0.0–1.2)
CO2: 18 mmol/L (ref 18–29)
Calcium: 9.2 mg/dL (ref 8.7–10.2)
Chloride: 105 mmol/L (ref 97–108)
Creatinine, Ser: 0.85 mg/dL (ref 0.57–1.00)
GFR calc Af Amer: 109 mL/min/{1.73_m2} (ref >59)
GFR calc non Af Amer: 95 mL/min/{1.73_m2} (ref >59)
Globulin, Total: 3.3 g/dL (ref 1.5–4.5)
Glucose: 88 mg/dL (ref 65–99)
Potassium: 4.4 mmol/L (ref 3.5–5.2)
Sodium: 138 mmol/L (ref 134–144)
Total Protein: 7.6 g/dL (ref 6.0–8.5)

## 2015-04-03 NOTE — Telephone Encounter (Signed)
LVM for pt to call back about lab results. Gave [facility] phone number and hours.

## 2015-04-03 NOTE — Telephone Encounter (Signed)
-----   Message from [name], MD sent at 04/03/2015  9:54 AM EDT ----- Let patient know her labs looked good except one of her values (alkphos) was elevated, sometimes this is seen in gallbladder disease. If she has any abdominal pain she should see her pcp and follow up about this value. Thanks.

## 2015-04-06 ENCOUNTER — Telehealth: Payer: Self-pay | Admitting: *Deleted

## 2015-04-06 NOTE — Telephone Encounter (Signed)
Patient labs faxed to Dr Benson Norway on 04/06/15.

## 2015-04-06 NOTE — Telephone Encounter (Signed)
Spoke w/ pt about labs results. Told her labs looked okay per Dr. Jaynee Eagles except her Gaspar Bidding was elevated at 207. Normal being 39-117. She should contact her PCP if she has abdominal pain and f/u about this. She stated she does not have a gallbladder and is going to f.u with her gastro doctor. Told her to call if she has further questions.

## 2015-04-08 ENCOUNTER — Telehealth: Payer: Self-pay | Admitting: Neurology

## 2015-04-08 ENCOUNTER — Other Ambulatory Visit: Payer: Self-pay | Admitting: Gastroenterology

## 2015-04-08 DIAGNOSIS — R772 Abnormality of alphafetoprotein: Secondary | ICD-10-CM

## 2015-04-08 NOTE — Telephone Encounter (Signed)
Called [employer]. Was on hold for an hour. Will try again tomorrow   Phone Number: [phone]            Dr. Jaynee Eagles,   [facility] denied the MRI BRAIN and is requesting peer to peer.  Per pt's request, I sent the order to [facility].   They have tried to call her once and left a message for her to call  Back and schedule.   Please call # [phone] case # [id number].   Please advise with auth or on how to proceed.   Thank you!

## 2015-04-16 ENCOUNTER — Ambulatory Visit
Admission: RE | Admit: 2015-04-16 | Discharge: 2015-04-16 | Disposition: A | Discharge status: Home / Self Care | Payer: PRIVATE HEALTH INSURANCE | Source: Ambulatory Visit | Attending: Gastroenterology | Admitting: Gastroenterology

## 2015-04-16 DIAGNOSIS — R772 Abnormality of alphafetoprotein: Secondary | ICD-10-CM

## 2015-04-16 MED ORDER — GADOBENATE DIMEGLUMINE 529 MG/ML IV SOLN
19.0000 mL | Freq: Once | INTRAVENOUS | Status: AC | PRN
  Administered 2015-04-16: 19 mL via INTRAVENOUS

## 2015-04-22 ENCOUNTER — Ambulatory Visit (HOSPITAL_COMMUNITY): Payer: Self-pay | Admitting: Psychiatry

## 2015-04-22 NOTE — Telephone Encounter (Signed)
Diana Page, let patient know that her insurance will not approve another MRI of the brain so soon. We can try again in several months. I am not concerned though because she is feeling better, we can wait on the MRI for 3-6 more months. Let me know what patient thinks, thanks

## 2015-04-22 NOTE — Telephone Encounter (Signed)
LVM for pt to call office back. Gave [facility] phone and hours.

## 2015-04-23 NOTE — Telephone Encounter (Signed)
Pt called returning Emma's call

## 2015-04-23 NOTE — Telephone Encounter (Signed)
Called pt to let her know per [name] that MRI brain could not be approved by her insurance company so soon. We can try again in several months. [name] is not concerned though because she is feeling better. We can wait on MRI for 3-6 months. Pt okay with this and states she also got a letter from her insurance company. Told her to call if anything changes or she has further questions, to call the office. She verbalized understanding.

## 2015-04-27 ENCOUNTER — Encounter (HOSPITAL_COMMUNITY): Payer: Self-pay | Admitting: Psychiatry

## 2015-04-27 ENCOUNTER — Ambulatory Visit (INDEPENDENT_AMBULATORY_CARE_PROVIDER_SITE_OTHER): Payer: Self-pay | Admitting: Psychiatry

## 2015-04-27 VITALS — BP 121/86 | HR 86 | Ht 62.0 in | Wt 208.0 lb

## 2015-04-27 DIAGNOSIS — F320 Major depressive disorder, single episode, mild: Secondary | ICD-10-CM

## 2015-04-27 DIAGNOSIS — F429 Obsessive-compulsive disorder, unspecified: Secondary | ICD-10-CM

## 2015-04-27 MED ORDER — OLANZAPINE-FLUOXETINE HCL 3-25 MG PO CAPS: 1.0000 | ORAL_CAPSULE | Freq: Every day | ORAL | Status: DC

## 2015-04-27 MED ORDER — ALPRAZOLAM 0.25 MG PO TABS: 0.2500 mg | ORAL_TABLET | Freq: Every day | ORAL | Status: DC | PRN

## 2015-04-27 NOTE — Progress Notes (Signed)
[place] Progress Note  Diana Page [id number] 24 y.o.  04/27/2015 5:02 PM  Chief Complaint:  I am feeling better but I'm concerned about increased alkaline phosphatase.     History of Present Illness:  Diana Page came for her follow-up appointment.  She is stressed about her increased alkaline phosphatase.  She was seen recently by her GI and neurologist and find out that she has high alkaline phosphatase.  It jump from 131-204 .  She was told to discuss with psychiatrists if Symbyax need to be discontinued.  She does not want to discontinue Symbyax because it is helping her OCD, depression and delusional thinking.  She had multiple past including MRI, imaging studies.  She is thinking what other causes can cause increased alkaline phosphatase and she realized that she has vitamin D deficiency.  She also given steroids in the past which could be one of the reason causing alkaline phosphatase.  She sleeping good she denies any paranoia, hallucination or any obsessive thoughts.  She is pleased with her current psychiatric medication.  She has no tremors, shakes or any other concern.  She really does not want to try a different medication because she does not want weight gain, tremors, shakes.  She is actively looking for a job.  Her energy level is good.  She denies drinking or using any illegal substances.  She lives with her boyfriend was very supportive.  Her appetite is okay.  Her vitals are stable.  Suicidal Ideation: No Plan Formed: No Patient has means to carry out plan: No  Homicidal Ideation: No Plan Formed: No Patient has means to carry out plan: No  Past Psychiatric History/Hospitalization(s): Patient endorse history of anxiety most of her life however in past 2 years symptoms started to get worse.  She started counseling and diagnosed with OCD.  Initially she tried Prozac low dose but she did not like it and then her psychiatrist Dr. Leilani Merl in [place] started  her on Symbyax.  Patient had a good response with this medication.  Patient denies any hallucination, paranoia, mood swing, agitation or any anger.  She denies any history of suicidal attempt or any inpatient psychiatric treatment. Anxiety: Yes Bipolar Disorder: No Depression: Yes Mania: No Psychosis: No Schizophrenia: No Personality Disorder: No Hospitalization for psychiatric illness: No History of Electroconvulsive Shock Therapy: No Prior Suicide Attempts: No  Medical History; Patient recently diagnosed with intracranial hypertension .  She has chronic inflammatory bowel disease and in March 2016 she has ileostomy and she has ileostomy bag.  She has history of anemia, pancreatitis and history of cholecystectomy.  She see Dr. Saralyn Pilar hung for her GI issues.  Her neurologist is Dr. Acie Fredrickson.   Review of Systems  Constitutional: Negative for weight loss.  Musculoskeletal: Negative.   Skin: Negative for itching and rash.  Neurological: Negative for dizziness, tremors and headaches.  Psychiatric/Behavioral: The patient is nervous/anxious.     Psychiatric: Agitation: No Hallucination: No Depressed Mood: No Insomnia: No Hypersomnia: No Altered Concentration: No Feels Worthless: No Grandiose Ideas: No Belief In Special Powers: No New/Increased Substance Abuse: No Compulsions: Obsessive thoughts while she drives that she may hit the person  Neurologic: Headache: No Seizure: No Paresthesias: No   Outpatient Encounter Prescriptions as of 04/27/2015  Medication Sig  . acetaZOLAMIDE (DIAMOX) 500 MG capsule Take 1 capsule (500 mg total) by mouth 3 (three) times daily.  Marland Kitchen ALPRAZolam (XANAX) 0.25 MG tablet Take 1 tablet (0.25 mg total) by mouth daily  as needed for anxiety.  . Multiple Vitamin (MULTIVITAMIN WITH MINERALS) TABS tablet Take 1 tablet by mouth daily at 12 noon.  Marland Kitchen OLANZapine-FLUoxetine (SYMBYAX) 3-25 MG capsule Take 1 capsule by mouth at bedtime.  . [DISCONTINUED]  ALPRAZolam (XANAX) 0.25 MG tablet Take 0.25 mg by mouth daily as needed for anxiety.   . [DISCONTINUED] OLANZapine-FLUoxetine (SYMBYAX) 3-25 MG per capsule Take 1 capsule by mouth at bedtime.   No facility-administered encounter medications on file as of 04/27/2015.    Recent Results (from the past 2160 hour(s))  Comprehensive metabolic panel     Status: Abnormal   Collection Time: 04/02/15  3:28 PM  Result Value Ref Range   Glucose 88 65 - 99 mg/dL   BUN 12 6 - 20 mg/dL   Creatinine, Ser 0.85 0.57 - 1.00 mg/dL   GFR calc non Af Amer 95 >59 mL/min/1.73   GFR calc Af Amer 109 >59 mL/min/1.73   BUN/Creatinine Ratio 14 8 - 20   Sodium 138 134 - 144 mmol/L    Comment: **Effective April 13, 2015 the reference interval**   for Sodium, Serum will be changing to:                                             136 - 144    Potassium 4.4 3.5 - 5.2 mmol/L    Comment: **Effective April 13, 2015 the reference interval**   for Potassium, Serum will be changing to:                          0 -  7 days        3.7 - 5.2                          8 - 30 days        3.7 - 6.4                          1 -  6 months      3.8 - 6.0                   7 months -  1 year        3.8 - 5.3                              >1 year        3.5 - 5.2    Chloride 105 97 - 108 mmol/L    Comment: **Effective April 13, 2015 the reference interval**   for Chloride, Serum will be changing to:                                              97 - 106    CO2 18 18 - 29 mmol/L   Calcium 9.2 8.7 - 10.2 mg/dL   Total Protein 7.6 6.0 - 8.5 g/dL   Albumin 4.3 3.5 - 5.5 g/dL   Globulin, Total 3.3 1.5 - 4.5 g/dL   Albumin/Globulin Ratio 1.3 1.1 - 2.5   Bilirubin Total 0.2 0.0 - 1.2  mg/dL   Alkaline Phosphatase 207 (H) 39 - 117 IU/L   AST 19 0 - 40 IU/L   ALT 21 0 - 32 IU/L      Constitutional:  BP 121/86 mmHg  Pulse 86  Ht 5' 2"  (1.575 m)  Wt 208 lb (94.348 kg)  BMI 38.03 kg/m2   Musculoskeletal: Strength &  Muscle Tone: within normal limits Gait & Station: normal Patient leans: N/A  Psychiatric Specialty Exam: General Appearance: Casual  Eye Contact::  Good  Speech:  Slow  Volume:  Normal  Mood:  Anxious  Affect:  Congruent  Thought Process:  Coherent  Orientation:  Full (Time, Place, and Person)  Thought Content:  Obsessions  Suicidal Thoughts:  No  Homicidal Thoughts:  No  Memory:  Immediate;   Good Recent;   Good Remote;   Good  Judgement:  Good  Insight:  Good  Psychomotor Activity:  Normal  Concentration:  Fair  Recall:  [place] of Knowledge:  Good  Language:  Good  Akathisia:  No  Handed:  Right  AIMS (if indicated):     Assets:  Communication Skills Desire for Improvement Financial Resources/Insurance Housing Social Support Transportation  ADL's:  Intact  Cognition:  WNL  Sleep:        Established Problem, Stable/Improving (1), Review of Psycho-Social Stressors (1), Review or order clinical lab tests (1), Review and summation of old records (2), New Problem, with no additional work-up planned (3), Review of Last Therapy Session (1) and Review of New Medication or Change in Dosage (2)  Assessment: Axis I: Obsessive-compulsive disorder.  Major depressive disorder recurrent mild  Axis II: Deferred  Axis III:  Past Medical History  Diagnosis Date  . UC (ulcerative colitis) ([place])   . Pancreatitis   . Pancreatitis     hx of   . Obsessive compulsive disorder 06/14     Plan:  I review her collateral information including recent blood work results, alkaline phosphatase readings and her other medication.  She does not want to change or discontinue her Symbyax .  She is taking anyway low-dose medication.  I recommended to try taking vitamin D and repeated vitamin D level and alkaline phosphatase level again.  If alkaline phosphatase does not get better or number continue to increase then we can think about switching to either low-dose Abilify or Geodon.  We  discussed weight loss in detail.  She is feeling better since Diamox added to help Psedu motor cerebri .  She is taking Xanax 0.25 mg as needed .  She does not ask early refills.  I discuss medication side effects and benefits and length.  I will see her again in 6 weeks to 2 months.  Time spent 25 minutes.  More than 50% of the time spent in psychoeducation, counseling and coronation of care.  Patient asked multiple questions about her diagnosis, prognosis and long-term effects.    Clayborne Divis T., MD 04/27/2015

## 2015-05-05 ENCOUNTER — Encounter: Payer: Self-pay | Admitting: Neurology

## 2015-05-05 ENCOUNTER — Ambulatory Visit (INDEPENDENT_AMBULATORY_CARE_PROVIDER_SITE_OTHER): Payer: PRIVATE HEALTH INSURANCE | Admitting: Neurology

## 2015-05-05 VITALS — BP 123/75 | HR 95 | Ht 62.0 in | Wt 215.2 lb

## 2015-05-05 DIAGNOSIS — G932 Benign intracranial hypertension: Secondary | ICD-10-CM

## 2015-05-05 NOTE — Progress Notes (Signed)
[name]    CC: Headache and vision loss, dizziness  Interval update 05/05/2015: No headaches, feels fine. Symptoms resolved. Tolerating the diamox. Saw [name]. Discussed weight loss is critical. Visual fields intact, no vision changes.   Interval update 04/27/2015: Opening pressure of LP was 34.5. 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 started on diamox. the thumping and pressure in her head have improved. The peripheral vision changes are gone, no headaches. She needs follow up with ophthalmology. She needs an ophthalmologist to follow in the area, will refer to [name]. She denies any current side effects from the diamox.   HPI 12/31/2014: Diana Page is a 24 y.o. female here as a referral from Dr. Jeanie Cooks for dizziness.  PMHx obesity. She was seen in the ED for acute onset headache and dizziness with decreased peripheral vision. She had an abnormal MRI and was called back to the ED for lumbar puncture which showed negative/normal CSF analysis. Unfortunately the LP did not include an opening pressure. She felt much better after the LP however. Currently if she stands up, she gets a thumping in the head. She gets stars in her right > Left peripheral vision. She gets pressure in the head all over. If she lays on her stomach, the pressure is worse and she can feel her heartbeat in her head. She went to optometry who diagnosed edema of the optic disks. Right eye has blurry vision which is persistent. She has had severe headaches. The dizziness has resolved after the LP. Symptoms felt much better after LP. Denies seizure-like events, staring, altered awareness. Symptoms are daily. No other associated focal neurologic deficits. No neck pain.   Reviewed notes, labs and imaging from  outside physicians, which showed: MRi brain 12/27/2014: Personally reviewed images: 1. Abnormal signal within the medial temporal lobes bilaterally predominantly involving the hippocampal structures. This is concerning for infectious process including herpes encephalitis. Other viral encephalitides can give a similar picture. Limbic encephalitis can look like this, but is typically associated with lung cancer. Alternatively, this could be related to seizure activity. The patient has no history of seizures. 2. Question abnormal signal within the anterior aspect of the right optic nerve, potentially related to same process.   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 Widening of the optic nerve sheath. This can be observed with intracranial hypertension, and is commonly seen with pseudotumor cerebri. 2. The subtle signal changes in the mesial temporal lobes appear unchanged. Although initially worrisome for an infectious or limbic encephalopathy, the stability and the absence of specific clinical symptoms make this less likely. If clinically indicated, consider follow-up evaluation or follow-up CSF analysis  Labs:   CSF: protein, glucose, diff, cell count normal TSH, B12, B1, HIV wnl CRP, ESR elevated CMP unremarkable CBC w/ anemia   Review of Systems: Patient complains of symptoms per HPI as well as the following symptoms: No CP, no SOB. Pertinent negatives per HPI. All others negative.  Social History   Social History  . Marital Status: Single  Spouse Name: N/A  . Number of Children: 0  . Years of Education: Ba   Occupational History  . Not on file.    Social History Main Topics  . Smoking status: Former Smoker    Quit date: 06/27/2009  . Smokeless tobacco: Never Used  . Alcohol Use: No  . Drug Use: No  . Sexual Activity: Not on file   Other Topics Concern  . Not on file   Social History Narrative   Lives at home with boyfriend, Lennette Bihari.   Caffeine use: Tea occass    Family History  Problem Relation Age of Onset  . OCD Mother   . OCD Sister   . OCD Brother     Past Medical History  Diagnosis Date  . UC (ulcerative colitis) ([place])   . Pancreatitis   . Pancreatitis     hx of   . Obsessive compulsive disorder 06/14    Past Surgical History  Procedure Laterality Date  . Cholecystectomy    . Permanent ileostomy      Current Outpatient Prescriptions  Medication Sig Dispense Refill  . acetaZOLAMIDE (DIAMOX) 500 MG capsule Take 1 capsule (500 mg total) by mouth 3 (three) times daily. 90 capsule 11  . ALPRAZolam (XANAX) 0.25 MG tablet Take 1 tablet (0.25 mg total) by mouth daily as needed for anxiety. 15 tablet 1  . Multiple Vitamin (MULTIVITAMIN WITH MINERALS) TABS tablet Take 1 tablet by mouth daily at 12 noon.    Marland Kitchen OLANZapine-FLUoxetine (SYMBYAX) 3-25 MG capsule Take 1 capsule by mouth at bedtime. 30 capsule 2   No current facility-administered medications for this visit.    Allergies as of 05/05/2015  . (No Known Allergies)    Vitals: BP 123/75 mmHg  Pulse 95  Ht 5' 2"  (1.575 m)  Wt 215 lb 3.2 oz (97.614 kg)  BMI 39.35 kg/m2 Last Weight:  Wt Readings from Last 1 Encounters:  05/05/15 215 lb 3.2 oz (97.614 kg)   Last Height:   Ht Readings from Last 1 Encounters:  05/05/15 5' 2"  (1.575 m)   Cranial Nerves:  The pupils are equal, round, and reactive to light. No APD, R>L disk edema improved. Visual fields are full to finger confrontation. Extraocular movements are intact. Trigeminal sensation is intact and the muscles of mastication are normal. The face is symmetric. The palate elevates in the midline.  Hearing intact. Voice is normal. Shoulder shrug is normal. The tongue has normal motion without fasciculations.    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 Opening pressure was 34.5, improved on diamox. Abnormal MRI with abnormal signal within the medial temporal lobes bilaterally predominantly involving the hippocampal structures. 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 Repeat MRi showed subtle signal changes in the mesial temporal lobes unchanged, although initially worrisome for an infectious or limbic encephalopathy, the stability and the absence of specific clinical symptoms make this less likely.   Will order LP with opening pressure.- was 34.5 opening pressure c/w IIH. Weight loss is critical Will repeat MRI of the brain w/wo contrast -stable, will repeat 6 months   Follow with Dr. Warden Fillers.  Continue diamox to 565m three times a day  ASarina Ill MD  [facility] [address] [place],  [address]  Phone [phone] Fax [phone]  A total of 30 minutes was spent face-to-face with this patient. Over half this time was spent on counseling patient on the IIH diagnosis and different diagnostic and therapeutic options available.

## 2015-07-28 ENCOUNTER — Ambulatory Visit (INDEPENDENT_AMBULATORY_CARE_PROVIDER_SITE_OTHER): Payer: PRIVATE HEALTH INSURANCE | Admitting: Psychiatry

## 2015-07-28 ENCOUNTER — Encounter (HOSPITAL_COMMUNITY): Payer: Self-pay | Admitting: Psychiatry

## 2015-07-28 VITALS — BP 106/72 | HR 94 | Ht 62.0 in | Wt 216.8 lb

## 2015-07-28 DIAGNOSIS — F429 Obsessive-compulsive disorder, unspecified: Secondary | ICD-10-CM

## 2015-07-28 DIAGNOSIS — F330 Major depressive disorder, recurrent, mild: Secondary | ICD-10-CM

## 2015-07-28 MED ORDER — OLANZAPINE-FLUOXETINE HCL 3-25 MG PO CAPS: 1.0000 | ORAL_CAPSULE | Freq: Every day | ORAL | Status: DC

## 2015-07-28 NOTE — Progress Notes (Signed)
[facility] [phone] Progress Note  Diana Page [id number] 25 y.o.  07/28/2015 9:25 AM  Chief Complaint:  I am having surgery for my permanent osteotomy .  I will appointment on 14th.  I'm anxious but overall I'm doing better.       History of Present Illness:  Diana Page came for her follow-up appointment.  She had a quiet Christmas.  She did not go to up [place] to visit the family and initially she was sad and depressed but she did well overall.  She had a good relationship with her boyfriend.  She is seeing a Psychologist, sport and exercise at [facility] on February 12 .  She will require surgery for permanent osteotomy .  She is happy but also nervous about it.  She is taking Symbyax is working very well for her OCD.  She denies any irritability, anger, crying spells or any feeling of sadness or hopelessness.  Recently she's seen her GI and she was pleased that alkaline phosphatase is getting better.  She do not remember the actual numbers.  She also saw a neurologist and taking Diamox for her pseudotumor cerebri.  She has noticed headaches are improved and she has no more tingling and nausea.  She also had a lumbar puncture to reduce opening pressure.  She was recommended to take vitamin D however she stopped taking after few days.  She denies any paranoia, hallucination, suicidal thoughts or homicidal thought.  Her energy level is good.  She is not happy that she gained weight and she is trying to watch her calories for weight control.  She continues to work at [employer] at [employer] and she enjoys her job.  Patient is talking to her therapist Agapito Games  who lives in [place] and she does phone session with him.  She has not taken Xanax in a while and denies any panic attack.  Patient denies drinking or using any illegal substances.  She lives with her boyfriend was very supportive.  She has gained weight from the past and her pulse is slightly increased.    Suicidal Ideation: No Plan Formed: No Patient  has means to carry out plan: No  Homicidal Ideation: No Plan Formed: No Patient has means to carry out plan: No  Past Psychiatric History/Hospitalization(s): Patient has history of anxiety most of her life.  She was diagnosed with OCD and in the past given Prozac when she was in [place].  However it did not helped and she was given Symbyax and she had a good response. Patient denies any hallucination, paranoia, mood swing, agitation or any anger.  She denies any history of suicidal attempt or any inpatient psychiatric treatment. Anxiety: Yes Bipolar Disorder: No Depression: Yes Mania: No Psychosis: No Schizophrenia: No Personality Disorder: No Hospitalization for psychiatric illness: No History of Electroconvulsive Shock Therapy: No Prior Suicide Attempts: No  Family History; Patient endorse mother, sister and brother has OCD and anxiety symptoms.  Medical History; Patient has pseudomotor cerebri with intracranial hypertension,  history of anemia, history of pancreatitis , history of cholecystectomy and chronic inflammatory bowel disease.  In March 2016 she has ileostomy and she has ileostomy bag.  She see Dr. Saralyn Pilar hung for her GI issues.  Her neurologist is Dr. Acie Fredrickson.   Review of Systems  Constitutional: Negative for weight loss.  Musculoskeletal: Negative.   Skin: Negative for itching and rash.  Neurological: Negative for dizziness, tremors and headaches.  Psychiatric/Behavioral: The patient is nervous/anxious.  Psychiatric: Agitation: No Hallucination: No Depressed Mood: No Insomnia: No Hypersomnia: No Altered Concentration: No Feels Worthless: No Grandiose Ideas: No Belief In Special Powers: No New/Increased Substance Abuse: No Compulsions: No  Neurologic: Headache: No Seizure: No Paresthesias: No   Outpatient Encounter Prescriptions as of 07/28/2015  Medication Sig  . acetaZOLAMIDE (DIAMOX) 500 MG capsule Take 1 capsule (500 mg total) by mouth 3  (three) times daily.  Marland Kitchen ALPRAZolam (XANAX) 0.25 MG tablet Take 1 tablet (0.25 mg total) by mouth daily as needed for anxiety.  . Multiple Vitamin (MULTIVITAMIN WITH MINERALS) TABS tablet Take 1 tablet by mouth daily at 12 noon.  Marland Kitchen OLANZapine-FLUoxetine (SYMBYAX) 3-25 MG capsule Take 1 capsule by mouth at bedtime.  . [DISCONTINUED] OLANZapine-FLUoxetine (SYMBYAX) 3-25 MG capsule Take 1 capsule by mouth at bedtime.   No facility-administered encounter medications on file as of 07/28/2015.    No results found for this or any previous visit (from the past 2160 hour(s)).    Constitutional:  BP 106/72 mmHg  Pulse 94  Ht 5' 2"  (1.575 m)  Wt 216 lb 12.8 oz (98.34 kg)  BMI 39.64 kg/m2  LMP 06/24/2015 (Approximate)   Musculoskeletal: Strength & Muscle Tone: within normal limits Gait & Station: normal Patient leans: N/A  Psychiatric Specialty Exam: General Appearance: Casual  Eye Contact::  Good  Speech:  Slow  Volume:  Normal  Mood:  Anxious  Affect:  Congruent  Thought Process:  Coherent  Orientation:  Full (Time, Place, and Person)  Thought Content:  Rumination  Suicidal Thoughts:  No  Homicidal Thoughts:  No  Memory:  Immediate;   Good Recent;   Good Remote;   Good  Judgement:  Good  Insight:  Good  Psychomotor Activity:  Normal  Concentration:  Fair  Recall:  [place] of Knowledge:  Good  Language:  Good  Akathisia:  No  Handed:  Right  AIMS (if indicated):     Assets:  Communication Skills Desire for Improvement Financial Resources/Insurance Housing Social Support Transportation  ADL's:  Intact  Cognition:  WNL  Sleep:        Established Problem, Stable/Improving (1), Review of Psycho-Social Stressors (1), Review or order clinical lab tests (1), Review and summation of old records (2), New Problem, with no additional work-up planned (3), Review of Last Therapy Session (1) and Review of New Medication or Change in Dosage (2)  Assessment: Axis I:  Obsessive-compulsive disorder.  Major depressive disorder recurrent mild  Axis II: Deferred  Axis III:  Past Medical History  Diagnosis Date  . UC (ulcerative colitis) ([place])   . Pancreatitis   . Pancreatitis     hx of   . Obsessive compulsive disorder 06/14     Plan:  I review her collateral inforFrom neurologist and psychosocial stressors.  Patient is doing better overall.  She has not been taking vitamin D but she reported her alkaline phosphatase is getting better.  She appears somewhat anxious about upcoming appointment and surgery for permanent osteotomy.  At this time we will continue Symbyaxng 3/25 mg at bedtime.  It is helping her OCD symptoms.  She has not taken Xanax in a while and she does not require a new prescription.  Encouraged to talk to her therapist monthly .  She has a therapist in [place] .  We also talk about weight gain and is strongly encouraged to watch her calorie intake and to regular exercise.  Discuss in detail medication side effects and benefits.  Recommended to call us back if she has any question or any concern.  Follow-up in 3 months. Time spent 25 minutes.  More than 50% of the time spent in psychoeducation, counseling and coronation of care.  Patient asked multiple questions about her diagnosis, prognosis and long-term effects.    ARFEEN,SYED T., MD 07/28/2015

## 2015-09-02 ENCOUNTER — Ambulatory Visit: Payer: PRIVATE HEALTH INSURANCE | Admitting: Neurology

## 2015-09-16 ENCOUNTER — Encounter (INDEPENDENT_AMBULATORY_CARE_PROVIDER_SITE_OTHER): Payer: Self-pay

## 2015-09-16 ENCOUNTER — Ambulatory Visit (INDEPENDENT_AMBULATORY_CARE_PROVIDER_SITE_OTHER): Payer: PRIVATE HEALTH INSURANCE | Admitting: Neurology

## 2015-09-16 VITALS — BP 118/62 | HR 89 | Ht 62.0 in | Wt 218.6 lb

## 2015-09-16 DIAGNOSIS — H547 Unspecified visual loss: Secondary | ICD-10-CM

## 2015-09-16 DIAGNOSIS — H539 Unspecified visual disturbance: Secondary | ICD-10-CM

## 2015-09-16 DIAGNOSIS — H4710 Unspecified papilledema: Secondary | ICD-10-CM

## 2015-09-16 DIAGNOSIS — R9082 White matter disease, unspecified: Secondary | ICD-10-CM | POA: Diagnosis not present

## 2015-09-16 DIAGNOSIS — H542 Low vision, both eyes: Secondary | ICD-10-CM

## 2015-09-16 DIAGNOSIS — H9190 Unspecified hearing loss, unspecified ear: Secondary | ICD-10-CM

## 2015-09-16 DIAGNOSIS — H905 Unspecified sensorineural hearing loss: Secondary | ICD-10-CM | POA: Diagnosis not present

## 2015-09-16 NOTE — Progress Notes (Signed)
[name]    Provider:  [name] Referring Provider: Nolene Ebbs, MD Primary Care Physician:  Philis Fendt, MD  CC: Headache and vision loss, dizziness  Interval history: She is having worsening blurry vision. This started within the last month. Things are not as crisp. No pressure. She is also hearing low level, wooshing in the ears. Worse when she is laying down and then stands. No headaches, no pressure. No side effects from the diamox. She has not been able to lose weight. Will keep her on the current dose and then if weight loss of 10% will decreasing the diamox. Need to repeat MRi of the brain due to new worsneing vision and hearing issues given previously seen Abnormal signal within the medial temporal lobes bilaterally predominantly involving the hippocampal structures.  Interval update: Opening pressure of LP was 34.5. 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 the thumping and pressure in her head have improved. The peripheral vision changes are gone, no headaches. She needs follow up with ophthalmology. She needs an ophthalmologist to follow in the area, will refer to [name]. She denies any current side effects from the diamox.   HPI: Diana Page is a 25 y.o. female here as a referral from Dr. Jeanie Cooks for dizziness.  PMHx obesity. She was seen in the ED for acute onset headache and dizziness with decreased peripheral vision. She had an abnormal MRI and was called back to the ED for lumbar puncture which showed negative/normal CSF analysis. Unfortunately the LP did not include an opening pressure. She felt much better after the LP however. Currently if she stands up, she gets a thumping in the head. She gets stars in her right > Left peripheral  vision. She gets pressure in the head all over. If she lays on her stomach, the pressure is worse and she can feel her heartbeat in her head. She went to optometry who diagnosed edema of the optic disks. Right eye has blurry vision which is persistent. She has had severe headaches. The dizziness has resolved after the LP. Symptoms felt much better after LP. Denies seizure-like events, staring, altered awareness. Symptoms are daily. No other associated focal neurologic deficits. No neck pain.   Reviewed notes, labs and imaging from outside physicians, which showed: MRi brain 12/27/2014: Personally reviewed images: 1. Abnormal signal within the medial temporal lobes bilaterally predominantly involving the hippocampal structures. This is concerning for infectious process including herpes encephalitis. Other viral encephalitides can give a similar picture. Limbic encephalitis can look like this, but is typically associated with lung cancer. Alternatively, this could be related to seizure activity. The patient has no history of seizures. 2. Question abnormal signal within the anterior aspect of the right optic nerve, potentially related to same process.   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 Widening of the optic nerve sheath. This can be observed with intracranial hypertension, and is commonly seen with pseudotumor cerebri. 2. The subtle signal changes in the mesial temporal lobes appear unchanged. Although initially  worrisome for an infectious or limbic encephalopathy, the stability and the absence of specific clinical symptoms make this less likely. If clinically  indicated, consider follow-up evaluation or follow-up CSF analysis  Labs:   CSF: protein, glucose, diff, cell count normal TSH, B12, B1, HIV wnl CRP, ESR elevated CMP unremarkable CBC w/ anemia   Review of Systems: Patient complains of symptoms per HPI as well as the following symptoms: No CP, no SOB. Pertinent negatives per HPI. All others negative.   Social History   Social History  . Marital Status: Single    Spouse Name: N/A  . Number of Children: 0  . Years of Education: Ba   Occupational History  . Not on file.   Social History Main Topics  . Smoking status: Former Smoker    Quit date: 06/27/2009  . Smokeless tobacco: Never Used  . Alcohol Use: No  . Drug Use: No  . Sexual Activity: Not on file   Other Topics Concern  . Not on file   Social History Narrative   Lives at home with boyfriend, Lennette Bihari.   Caffeine use: Tea occass    Family History  Problem Relation Age of Onset  . OCD Mother   . OCD Sister   . OCD Brother     Past Medical History  Diagnosis Date  . UC (ulcerative colitis) ([place])   . Pancreatitis   . Pancreatitis     hx of   . Obsessive compulsive disorder 06/14    Past Surgical History  Procedure Laterality Date  . Cholecystectomy    . Permanent ileostomy      Current Outpatient Prescriptions  Medication Sig Dispense Refill  . acetaZOLAMIDE (DIAMOX) 500 MG capsule Take 1 capsule (500 mg total) by mouth 3 (three) times daily. 90 capsule 11  . ALPRAZolam (XANAX) 0.25 MG tablet Take 1 tablet (0.25 mg total) by mouth daily as needed for anxiety. 15 tablet 1  . Multiple Vitamin (MULTIVITAMIN WITH MINERALS) TABS tablet Take 1 tablet by mouth daily at 12 noon.    Marland Kitchen OLANZapine-FLUoxetine (SYMBYAX) 3-25 MG capsule Take 1 capsule by mouth at bedtime. 30 capsule 2  . oxyCODONE (OXY IR/ROXICODONE) 5 MG immediate release tablet Take 5 mg by mouth every 4 (four) hours as needed.     No current facility-administered medications for this visit.      Allergies as of 09/16/2015  . (No Known Allergies)    Vitals: BP 118/62 mmHg  Pulse 89  Ht 5' 2"  (1.575 m)  Wt 218 lb 9.6 oz (99.156 kg)  BMI 39.97 kg/m2  SpO2 98% Last Weight:  Wt Readings from Last 1 Encounters:  09/16/15 218 lb 9.6 oz (99.156 kg)   Last Height:   Ht Readings from Last 1 Encounters:  09/16/15 5' 2"  (1.575 m)     Speech:  Speech is normal; fluent and spontaneous with normal comprehension.  Cognition:  The patient is oriented to person, place, and time;  Cranial Nerves:  The pupils are equal, round, and reactive to light. No APD, R>L disk edema. Visual fields are full to finger confrontation. Extraocular movements are intact. Trigeminal sensation is intact and the muscles of mastication are normal. The face is symmetric. The palate elevates in the midline. Hearing intact. Voice is normal. Shoulder shrug is normal. The tongue has normal motion without fasciculations.   Motor Observation:  No asymmetry, no atrophy, and no involuntary movements noted. Tone:  Normal muscle tone.   Posture:  Posture is normal.  normal erect   Strength:  Strength is V/V in the upper and lower limbs.     Assessment/Plan: 25 year old female with papilledema, dizziness, positional headache, vision and hearing changes. Opening pressure was 34.5, improved on diamox. Abnormal MRI with abnormal signal within the medial temporal lobes bilaterally predominantly involving the hippocampal structures. 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 Repeat MRi showed subtle signal changes in the mesial temporal lobes unchanged, although initially worrisome for an  infectious or limbic encephalopathy, the stability and the absence of specific clinical symptoms make this less likely.  She is having blurry vision and increased hearing changes. Will reorder the MRi of the brain due to previously seen abnormalities above Continue diamox Weight loss Should proceed to ED if vision significantly worsens Has follow up with [facility] soon  Discussed the following: To prevent or relieve headaches, try the following: Cool Compress. Lie down and place a cool compress on your head.  Avoid headache triggers. If certain foods or odors seem to have triggered your migraines in the past, avoid them. A headache diary might help you identify triggers.  Include physical activity in your daily routine. Try a daily walk or other moderate aerobic exercise.  Manage stress. Find healthy ways to cope with the stressors, such as delegating tasks on your to-do list.  Practice relaxation techniques. Try deep breathing, yoga, massage and visualization.  Eat regularly. Eating regularly scheduled meals and maintaining a healthy diet might help prevent headaches. Also, drink plenty of fluids.  Follow a regular sleep schedule. Sleep deprivation might contribute to headaches Consider biofeedback. With this mind-body technique, you learn to control certain bodily functions - such as muscle tension, heart rate and blood pressure - to prevent headaches or reduce headache pain.    Proceed to emergency room if you experience new or worsening symptoms or symptoms do not resolve, if you have new neurologic symptoms or if headache is severe, or for any concerning symptom.  Sarina Ill, MD  [facility] [address] [place], [place] [address]  Phone [phone] Fax [phone]  A total of 30 minutes was spent face-to-face with this patient. Over half this time was spent on counseling patient on the IIH and Abnormal signal within the medial temporal lobes  diagnosis and different diagnostic and therapeutic options available.

## 2015-09-16 NOTE — Patient Instructions (Signed)
Overall you are doing fairly well but I do want to suggest a few things today:   Remember to drink plenty of fluid, eat healthy meals and do not skip any meals. Try to eat protein with a every meal and eat a healthy snack such as fruit or nuts in between meals. Try to keep a regular sleep-wake schedule and try to exercise daily, particularly in the form of walking, 20-30 minutes a day, if you can.   As far as your medications are concerned, I would like to suggest: continue current meds  As far as diagnostic testing: MRI brain  I would like to see you back in 6 months, sooner if we need to. Please call us with any interim questions, concerns, problems, updates or refill requests.   Please also call us for any test results so we can go over those with you on the phone.  My clinical assistant and will answer any of your questions and relay your messages to me and also relay most of my messages to you.   Our phone number is [phone]. We also have an after hours call service for urgent matters and there is a physician on-call for urgent questions. For any emergencies you know to call 911 or go to the nearest emergency room

## 2015-09-19 ENCOUNTER — Encounter: Payer: Self-pay | Admitting: Neurology

## 2015-09-25 ENCOUNTER — Ambulatory Visit
Admission: RE | Admit: 2015-09-25 | Discharge: 2015-09-25 | Disposition: A | Discharge status: Home / Self Care | Payer: PRIVATE HEALTH INSURANCE | Source: Ambulatory Visit | Attending: Neurology | Admitting: Neurology

## 2015-09-25 DIAGNOSIS — H9190 Unspecified hearing loss, unspecified ear: Secondary | ICD-10-CM

## 2015-09-25 DIAGNOSIS — R9082 White matter disease, unspecified: Secondary | ICD-10-CM

## 2015-09-25 DIAGNOSIS — H539 Unspecified visual disturbance: Secondary | ICD-10-CM

## 2015-09-25 DIAGNOSIS — H547 Unspecified visual loss: Secondary | ICD-10-CM

## 2015-09-25 DIAGNOSIS — H4710 Unspecified papilledema: Secondary | ICD-10-CM

## 2015-09-25 MED ORDER — GADOBENATE DIMEGLUMINE 529 MG/ML IV SOLN
20.0000 mL | Freq: Once | INTRAVENOUS | Status: AC | PRN
  Administered 2015-09-25: 20 mL via INTRAVENOUS

## 2015-10-03 ENCOUNTER — Telehealth: Payer: Self-pay | Admitting: Neurology

## 2015-10-03 NOTE — Telephone Encounter (Signed)
Discussed MRI improvement with patient.  The brain parenchyma shows no significant abnormalities except subtle prominence and thickening of the right hippocampus and medial temporal lobe which is less prominent compared to the previous scan and is of unclear significance.

## 2015-10-26 ENCOUNTER — Ambulatory Visit (HOSPITAL_COMMUNITY): Payer: Self-pay | Admitting: Psychiatry

## 2015-11-05 ENCOUNTER — Ambulatory Visit (INDEPENDENT_AMBULATORY_CARE_PROVIDER_SITE_OTHER): Payer: PRIVATE HEALTH INSURANCE | Admitting: Psychiatry

## 2015-11-05 ENCOUNTER — Encounter (HOSPITAL_COMMUNITY): Payer: Self-pay | Admitting: Psychiatry

## 2015-11-05 VITALS — BP 118/68 | HR 85 | Ht 62.0 in | Wt 212.4 lb

## 2015-11-05 DIAGNOSIS — F429 Obsessive-compulsive disorder, unspecified: Secondary | ICD-10-CM | POA: Diagnosis not present

## 2015-11-05 MED ORDER — OLANZAPINE-FLUOXETINE HCL 3-25 MG PO CAPS: 1.0000 | ORAL_CAPSULE | Freq: Every day | ORAL | Status: DC

## 2015-11-05 MED ORDER — ALPRAZOLAM 0.5 MG PO TABS: 0.5000 mg | ORAL_TABLET | Freq: Every day | ORAL | Status: DC | PRN

## 2015-11-05 NOTE — Progress Notes (Signed)
[facility] [id number] Progress Note  Diana Page [id number] 25 y.o.  11/05/2015 9:45 AM  Chief Complaint:  Medication management and follow-up.         History of Present Illness:  Diana Page came for her follow-up appointment.  She had surgery for her permanent osteomy and she needed a pain medication after the surgery which make her more depressed sad and anxious .  However she is no longer taking narcotic medicine and things are much better.  She is hoping recovery go very well and she had appointment next month to see the surgeon.  She is taking Symbyax and that's working very well for her OCD.  She denies any irritability, anger, mood swing.  Her sleep is good.  He denies any major panic attack.  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  Her headaches are less intense.  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  Patient denies any paranoia or any hallucination.  She has no tremors or shakes.  Her appetite is okay.  Her vital signs are stable.  Patient denies drinking alcohol or using any illegal substances.  She lives with her boyfriend was very supportive.  She has taken a few Xanax after the surgery and she is out of refills.  She is going [place] next week for 2 weeks and she will see her therapist Randa Ngo for counseling.  Suicidal Ideation: No Plan Formed: No Patient has means to carry out plan: No  Homicidal Ideation: No Plan Formed: No Patient has means to carry out plan: No  Past Psychiatric History/Hospitalization(s): Patient has history of anxiety most of her life.  She was diagnosed with OCD and in the past given Prozac when she was in [place].  However it did not helped and she was given Symbyax and she had a good response. Patient denies any hallucination, paranoia, mood swing, agitation or any anger.  She denies any history of suicidal attempt or any inpatient psychiatric treatment. Anxiety: Yes Bipolar  Disorder: No Depression: Yes Mania: No Psychosis: No Schizophrenia: No Personality Disorder: No Hospitalization for psychiatric illness: No History of Electroconvulsive Shock Therapy: No Prior Suicide Attempts: No  Family History; Patient endorse mother, sister and brother has OCD and anxiety symptoms.  Medical History; Patient has pseudomotor cerebri with intracranial hypertension,  history of anemia, history of pancreatitis , history of cholecystectomy and chronic inflammatory bowel disease.  In March 2016 she has ileostomy and she has ileostomy bag.  She see Dr. Saralyn Pilar hung for her GI issues.  Her neurologist is Dr. Acie Fredrickson.   Review of Systems  Constitutional: Negative for weight loss.  Musculoskeletal: Negative.   Skin: Negative for itching and rash.  Neurological: Negative for dizziness, tremors and headaches.    Psychiatric: Agitation: No Hallucination: No Depressed Mood: No Insomnia: No Hypersomnia: No Altered Concentration: No Feels Worthless: No Grandiose Ideas: No Belief In Special Powers: No New/Increased Substance Abuse: No Compulsions: No  Neurologic: Headache: No Seizure: No Paresthesias: No   Outpatient Encounter Prescriptions as of 11/05/2015  Medication Sig  . acetaZOLAMIDE (DIAMOX) 500 MG capsule Take 1 capsule (500 mg total) by mouth 3 (three) times daily.  Marland Kitchen ALPRAZolam (XANAX) 0.5 MG tablet Take 1 tablet (0.5 mg total) by mouth daily as needed for anxiety.  . Multiple Vitamin (MULTIVITAMIN WITH MINERALS) TABS tablet Take 1 tablet by mouth daily at 12 noon.  Marland Kitchen OLANZapine-FLUoxetine (SYMBYAX) 3-25 MG  capsule Take 1 capsule by mouth at bedtime.  . [DISCONTINUED] ALPRAZolam (XANAX) 0.25 MG tablet Take 1 tablet (0.25 mg total) by mouth daily as needed for anxiety.  . [DISCONTINUED] OLANZapine-FLUoxetine (SYMBYAX) 3-25 MG capsule Take 1 capsule by mouth at bedtime.  . [DISCONTINUED] oxyCODONE (OXY IR/ROXICODONE) 5 MG immediate release tablet Take  5 mg by mouth every 4 (four) hours as needed.   No facility-administered encounter medications on file as of 11/05/2015.    No results found for this or any previous visit (from the past 2160 hour(s)).    Constitutional:  BP 118/68 mmHg  Pulse 85  Ht 5' 2"  (1.575 m)  Wt 212 lb 6.4 oz (96.344 kg)  BMI 38.84 kg/m2   Musculoskeletal: Strength & Muscle Tone: within normal limits Gait & Station: normal Patient leans: N/A  Psychiatric Specialty Exam: General Appearance: Casual  Eye Contact::  Good  Speech:  Slow  Volume:  Normal  Mood:  Anxious  Affect:  Congruent  Thought Process:  Coherent  Orientation:  Full (Time, Place, and Person)  Thought Content:  Rumination  Suicidal Thoughts:  No  Homicidal Thoughts:  No  Memory:  Immediate;   Good Recent;   Good Remote;   Good  Judgement:  Good  Insight:  Good  Psychomotor Activity:  Normal  Concentration:  Fair  Recall:  [place] of Knowledge:  Good  Language:  Good  Akathisia:  No  Handed:  Right  AIMS (if indicated):     Assets:  Communication Skills Desire for Improvement Financial Resources/Insurance Housing Social Support Transportation  ADL's:  Intact  Cognition:  WNL  Sleep:        Established Problem, Stable/Improving (1), Review of Psycho-Social Stressors (1), Review of Last Therapy Session (1) and Review of New Medication or Change in Dosage (2)  Assessment: Axis I: Obsessive-compulsive disorder.  Major depressive disorder recurrent mild  Axis II: Deferred  Axis III:  Past Medical History  Diagnosis Date  . UC (ulcerative colitis) ([place])   . Pancreatitis   . Pancreatitis     hx of   . Obsessive compulsive disorder 06/14  . Ostomy nurse consultation      Plan:  Patient is a stable on current psychiatric medication.  She is taking Symbyax 3-25 mg at bedtime and Xanax 0.5 mg only as needed for severe anxiety and panic attack.  Discussed medication side effects and benefits.  Recommended to  call us back if she has any question or any concern.  She is talking to her therapist Melba Coon in [place] every week for counseling. Follow-up in 3 months.   Rayland Hamed T., MD 11/05/2015

## 2015-12-17 ENCOUNTER — Other Ambulatory Visit: Payer: Self-pay | Admitting: Neurology

## 2015-12-17 ENCOUNTER — Other Ambulatory Visit (HOSPITAL_COMMUNITY): Payer: Self-pay | Admitting: Psychiatry

## 2015-12-17 ENCOUNTER — Telehealth: Payer: Self-pay

## 2015-12-17 NOTE — Telephone Encounter (Signed)
Rn call patients pharmacy [facility] in [place] on [address]. Rn spoke to pharmacist to state pt was given 11 refills in 03/2015 be Dr. Jaynee Eagles. Pharmacist stated pts can only change one time with [facility]. They stated the refill request was not sent by them. Rn stated patient will be call.

## 2015-12-17 NOTE — Telephone Encounter (Signed)
RN call [facility] at the location in [place]. The pharmacist staff stated they see the refills are good till 03/2015 and not sure if the patient press the wrong button or is town.Rn stated patient was given 11 refills in 03/2015. Rn stated refill request will be denied.

## 2016-02-05 ENCOUNTER — Ambulatory Visit (HOSPITAL_COMMUNITY): Payer: Self-pay | Admitting: Psychiatry

## 2016-02-24 ENCOUNTER — Telehealth: Payer: Self-pay | Admitting: Neurology

## 2016-02-24 NOTE — Telephone Encounter (Signed)
Pt called said she is having dizziness, limbs feel heavy, breathless feeling, and pounding in the ears intermittently. I offered the pt several different appt times with NP's but she said she had to work and those times would not work. She said she would call back when she knew of a time she could come in. I advised it would be better if the RN call and touch base with her.

## 2016-02-24 NOTE — Telephone Encounter (Signed)
LVM for pt advising that per [name], she should go to ED to be further worked up. Asked her to call back to make sure she got my message. Gave [facility] phone number and hours.

## 2016-02-25 NOTE — Telephone Encounter (Signed)
[name]- FYI  Called pt back since no return call. She stated "Don't be mad at me. I did not go to the ED". She stated she thinks she was severely dehydrated and feels a lot better today. Sx have resolved. She stated she will go straight to the ED and have her fiance bring her if sx return or she has new/worseing sx. I verified that she has upcoming f/u on 9/25 at 930am, check in 915am. She verbalized understanding. She will call if she has any further questions.

## 2016-03-17 ENCOUNTER — Ambulatory Visit (INDEPENDENT_AMBULATORY_CARE_PROVIDER_SITE_OTHER): Payer: BLUE CROSS/BLUE SHIELD | Admitting: Psychiatry

## 2016-03-17 DIAGNOSIS — F429 Obsessive-compulsive disorder, unspecified: Secondary | ICD-10-CM

## 2016-03-17 MED ORDER — ALPRAZOLAM 0.5 MG PO TABS: 0.5000 mg | ORAL_TABLET | Freq: Every day | ORAL | 0 refills | Status: DC | PRN

## 2016-03-17 MED ORDER — OLANZAPINE-FLUOXETINE HCL 3-25 MG PO CAPS: 1.0000 | ORAL_CAPSULE | Freq: Every day | ORAL | 2 refills | Status: DC

## 2016-03-17 NOTE — Progress Notes (Signed)
[facility] [phone] Progress Note  Tiernan Millikin [id number] 25 y.o.  03/17/2016 3:54 PM  Chief Complaint:  Medication management and follow-up.         History of Present Illness:  Beautiful came for her follow-up appointment.  She had a good summer.  She is taking Symbyax as prescribed.  Overall her OCD is under control but sometime she has anxiety and nervousness.  She is taking Xanax 0.5 mg as needed.  She was given 15 tablets on her last visit and she is almost out.  Her job is going very well.  Sometime she gets anxious about the future.  She is getting married next year.  He is also engaging to move either [facility] or [place].  She wants to move away from her mother who lives in [place].  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  Patient denies drinking or using any illegal substances.  Her appetite is okay.  Vital signs are stable.  Suicidal Ideation: No Plan Formed: No Patient has means to carry out plan: No  Homicidal Ideation: No Plan Formed: No Patient has means to carry out plan: No  Past Psychiatric History/Hospitalization(s): Patient has history of anxiety most of her life.  She was diagnosed with OCD and in the past given Prozac when she was in [place].  However it did not helped and she was given Symbyax and she had a good response. Patient denies any hallucination, paranoia, mood swing, agitation or any anger.  She denies any history of suicidal attempt or any inpatient psychiatric treatment. Anxiety: Yes Bipolar Disorder: No Depression: Yes Mania: No Psychosis: No Schizophrenia: No Personality Disorder: No Hospitalization for psychiatric illness: No History of Electroconvulsive Shock Therapy: No Prior Suicide Attempts: No  Family History; Patient endorse mother, sister and brother has OCD and anxiety symptoms.  Medical History; Patient has pseudomotor cerebri with intracranial hypertension,  history of anemia,  history of pancreatitis , history of cholecystectomy and chronic inflammatory bowel disease.  In March 2016 she has ileostomy and she has ileostomy bag.  She see Dr. Saralyn Pilar hung for her GI issues.  Her neurologist is Dr. Acie Fredrickson.   Review of Systems  Constitutional: Negative for weight loss.  Musculoskeletal: Negative.   Skin: Negative for itching and rash.  Neurological: Negative for dizziness, tremors and headaches.    Psychiatric: Agitation: No Hallucination: No Depressed Mood: No Insomnia: No Hypersomnia: No Altered Concentration: No Feels Worthless: No Grandiose Ideas: No Belief In Special Powers: No New/Increased Substance Abuse: No Compulsions: No  Neurologic: Headache: No Seizure: No Paresthesias: No   Outpatient Encounter Prescriptions as of 03/17/2016  Medication Sig  . acetaZOLAMIDE (DIAMOX) 500 MG capsule Take 1 capsule (500 mg total) by mouth 3 (three) times daily.  Marland Kitchen ALPRAZolam (XANAX) 0.5 MG tablet Take 1 tablet (0.5 mg total) by mouth daily as needed for anxiety.  . Multiple Vitamin (MULTIVITAMIN WITH MINERALS) TABS tablet Take 1 tablet by mouth daily at 12 noon.  Marland Kitchen OLANZapine-FLUoxetine (SYMBYAX) 3-25 MG capsule Take 1 capsule by mouth at bedtime.  . [DISCONTINUED] ALPRAZolam (XANAX) 0.5 MG tablet Take 1 tablet (0.5 mg total) by mouth daily as needed for anxiety.  . [DISCONTINUED] ALPRAZolam (XANAX) 0.5 MG tablet Take 1 tablet (0.5 mg total) by mouth daily as needed for anxiety.  . [DISCONTINUED] ALPRAZolam (XANAX) 0.5 MG tablet Take 1 tablet (0.5 mg total) by mouth daily as needed for anxiety.  . [  DISCONTINUED] OLANZapine-FLUoxetine (SYMBYAX) 3-25 MG capsule Take 1 capsule by mouth at bedtime.   No facility-administered encounter medications on file as of 03/17/2016.     No results found for this or any previous visit (from the past 2160 hour(s)).    Constitutional:  BP 126/80   Pulse 74   Ht 5' 2"  (1.575 m)   Wt 209 lb 9.6 oz (95.1 kg)   BMI  38.34 kg/m    Musculoskeletal: Strength & Muscle Tone: within normal limits Gait & Station: normal Patient leans: N/A  Psychiatric Specialty Exam: General Appearance: Casual  Eye Contact::  Good  Speech:  Slow  Volume:  Normal  Mood:  Anxious  Affect:  Congruent  Thought Process:  Coherent  Orientation:  Full (Time, Place, and Person)  Thought Content:  Rumination  Suicidal Thoughts:  No  Homicidal Thoughts:  No  Memory:  Immediate;   Good Recent;   Good Remote;   Good  Judgement:  Good  Insight:  Good  Psychomotor Activity:  Normal  Concentration:  Fair  Recall:  [place] of Knowledge:  Good  Language:  Good  Akathisia:  No  Handed:  Right  AIMS (if indicated):     Assets:  Communication Skills Desire for Improvement Financial Resources/Insurance Housing Social Support Transportation  ADL's:  Intact  Cognition:  WNL  Sleep:        Established Problem, Stable/Improving (1), Review of Psycho-Social Stressors (1), Review of Last Therapy Session (1) and Review of New Medication or Change in Dosage (2)  Assessment: Axis I: Obsessive-compulsive disorder.  Major depressive disorder recurrent mild  Axis II: Deferred  Axis III:  Past Medical History:  Diagnosis Date  . Obsessive compulsive disorder 06/14  . Ostomy nurse consultation   . Pancreatitis   . Pancreatitis    hx of   . UC (ulcerative colitis) ([place])      Plan:  Patient is a stable on current psychiatric medication.  She is taking Symbyax 3-25 mg at bedtime and Xanax 0.5 mg only as needed for severe anxiety and panic attack.  Discussed medication side effects and benefits.  Recommended to call us back if she has any question or any concern.  She is talking to her therapist Melba Coon in [place] every week for counseling. Follow-up in 3 months.   Aman Batley T., MD 03/17/2016         Patient ID: Zenovia Jordan, female   DOB: Oct 24, 1990, 25 y.o.   MRN: [id number]

## 2016-03-19 ENCOUNTER — Other Ambulatory Visit (HOSPITAL_COMMUNITY): Payer: Self-pay | Admitting: Psychiatry

## 2016-03-19 DIAGNOSIS — F429 Obsessive-compulsive disorder, unspecified: Secondary | ICD-10-CM

## 2016-03-21 ENCOUNTER — Encounter: Payer: Self-pay | Admitting: Neurology

## 2016-03-21 ENCOUNTER — Ambulatory Visit (INDEPENDENT_AMBULATORY_CARE_PROVIDER_SITE_OTHER): Payer: BLUE CROSS/BLUE SHIELD | Admitting: Neurology

## 2016-03-21 VITALS — BP 109/71 | HR 72 | Ht 62.0 in | Wt 209.8 lb

## 2016-03-21 DIAGNOSIS — E669 Obesity, unspecified: Secondary | ICD-10-CM | POA: Diagnosis not present

## 2016-03-21 DIAGNOSIS — G932 Benign intracranial hypertension: Secondary | ICD-10-CM | POA: Diagnosis not present

## 2016-03-21 DIAGNOSIS — Z79899 Other long term (current) drug therapy: Secondary | ICD-10-CM | POA: Diagnosis not present

## 2016-03-21 MED ORDER — ACETAZOLAMIDE ER 500 MG PO CP12: 500.0000 mg | ORAL_CAPSULE | Freq: Three times a day (TID) | ORAL | 11 refills | Status: DC

## 2016-03-21 NOTE — Patient Instructions (Signed)
Remember to drink plenty of fluid, eat healthy meals and do not skip any meals. Try to eat protein with a every meal and eat a healthy snack such as fruit or nuts in between meals. Try to keep a regular sleep-wake schedule and try to exercise daily, particularly in the form of walking, 20-30 minutes a day, if you can.   As far as your medications are concerned, I would like to suggest: continue diamox  I would like to see you back in 4 months, sooner if we need to. Please call us with any interim questions, concerns, problems, updates or refill requests.   Our phone number is [phone]. We also have an after hours call service for urgent matters and there is a physician on-call for urgent questions. For any emergencies you know to call 911 or go to the nearest emergency room

## 2016-03-21 NOTE — Addendum Note (Signed)
Addended by: Rossie Muskrat L on: 03/21/2016 12:15 PM   Modules accepted: Orders

## 2016-03-21 NOTE — Progress Notes (Signed)
[name]    Provider:  [name] Referring Provider: Nolene Ebbs, MD Primary Care Physician:  Philis Fendt, MD  CC: Headache and vision loss, dizziness  Interval history 03/21/2016:   25 year old female with papilledema, dizziness, positional headache, vision and hearing changes. Opening pressure was 34.5, improved on diamox. Abnormal MRI with abnormal signal within the medial temporal lobes bilaterally predominantly involving the hippocampal structures. She was working hard, she was not drinking water and she was lifting a lot of things and she was running all day and she felt lightheaded and dizzy and fatigued. She went and sat down for 5 minutes and she was ok. She was not drinking any water. She has one headache that lasted a whole day and a whole night it was terrible. She tried giving up caffeine. Likely caffeine withdrawal. Restared caffeine and fine. Vision has been fine. No snoring, no morning headaches. She takes 1527m acetazolamide.   Interval history 09/16/2015: She is having worsening blurry vision. This started within the last month. Things are not as crisp. No pressure. She is also hearing low level, wooshing in the ears. Worse when she is laying down and then stands. No headaches, no pressure. No side effects from the diamox. She has not been able to lose weight. Will keep her on the current dose and then if weight loss of 10% will decreasing the diamox. Need to repeat MRi of the brain due to new worsneing vision and hearing issues given previously seen Abnormal signal within the medial temporal lobes bilaterally predominantly involving the hippocampal structures.  MRI 09/26/2015: FINDINGS:  The brain parenchyma shows no significant abnormalities except subtle prominence and thickening of the right hippocampus and medial temporal lobe which is less prominent compared to the previous scan and is of unclear significance. No other structural lesion, tumor  infarcts are noted. Diffusion weighted imaging is negative for acute ischemia. Susceptibility weighted imaging is negative for microhemorrhages. Subarachnoid spaces and ventricular system appear normal. Extraction brain structures up and normal. Calvarium shows no abnormalities. The pituitary gland and cerebellar tonsils appear normal. Flow voids of the large vessels are intact and circulation appear to be patent. Orbits and paranasal sinuses appear normal. Visualized portions of the cervical spine appears normal. Contrast images do not result in any abnormal areas of enhancement.     IMPRESSION:  Slightly abnormal MRI scan of the brain showing subtle prominence of the right medial temporal lobe which is of unclear significance. Overall no significant change compared with previous MRI dated 01/16/2015.  Interval update: Opening pressure of LP was 34.5. 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 the thumping and pressure in her head have improved. The peripheral vision changes are gone, no headaches. She needs follow up with ophthalmology. She needs an ophthalmologist to follow in the area, will refer to Dr. GKaty Fitch She denies any current side effects from the diamox.   HPI: Diana Page a 25y.o. female here as a referral from Dr. AJeanie Cooksfor dizziness.  PMHx obesity. She was seen in the ED for acute onset headache and dizziness with decreased peripheral vision. She had an abnormal MRI and was called back to the ED for lumbar puncture which showed negative/normal CSF analysis. Unfortunately the LP did not include an opening pressure. She felt much better after  the LP however. Currently if she stands up, she gets a thumping in the head. She gets stars in her right > Left peripheral  vision. She gets pressure in the head all over. If she lays on her stomach, the pressure is worse and she can feel her heartbeat in her head. She went to optometry who diagnosed edema of the optic disks. Right eye has blurry vision which is persistent. She has had severe headaches. The dizziness has resolved after the LP. Symptoms felt much better after LP. Denies seizure-like events, staring, altered awareness. Symptoms are daily. No other associated focal neurologic deficits. No neck pain.   Reviewed notes, labs and imaging from outside physicians, which showed: MRi brain 12/27/2014: Personally reviewed images: 1. Abnormal signal within the medial temporal lobes bilaterally predominantly involving the hippocampal structures. This is concerning for infectious process including herpes encephalitis. Other viral encephalitides can give a similar picture. Limbic encephalitis can look like this, but is typically associated with lung cancer. Alternatively, this could be related to seizure activity. The patient has no history of seizures. 2. Question abnormal signal within the anterior aspect of the right optic nerve, potentially related to same process.   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 Widening of the optic nerve sheath. This can be observed with intracranial hypertension, and is commonly seen with pseudotumor cerebri. 2. The subtle signal changes in the mesial temporal lobes appear unchanged. Although initially worrisome for an infectious or limbic encephalopathy, the stability and the absence of specific clinical symptoms make this less likely. If clinically  indicated, consider follow-up evaluation or follow-up CSF analysis  Labs:   CSF: protein, glucose, diff, cell count normal TSH, B12, B1, HIV wnl CRP, ESR elevated CMP unremarkable CBC w/ anemia   Review of Systems: Patient complains of symptoms per HPI as well as the following symptoms: No CP, no SOB. Pertinent negatives per HPI. All others negative.   Social History   Social History  . Marital status: Single    Spouse name: N/A  . Number of children: 0  . Years of education: Ba   Occupational History  . Not on file.   Social History Main Topics  . Smoking status: Former Smoker    Quit date: 06/27/2009  . Smokeless tobacco: Never Used  . Alcohol use No  . Drug use: No  . Sexual activity: Not on file   Other Topics Concern  . Not on file   Social History Narrative   Lives at home with boyfriend, Lennette Bihari.   Caffeine use: Tea occass    Family History  Problem Relation Age of Onset  . OCD Mother   . OCD Sister   . OCD Brother     Past Medical History:  Diagnosis Date  . Obsessive compulsive disorder 06/14  . Ostomy nurse consultation   . Pancreatitis   . Pancreatitis    hx of   . UC (ulcerative colitis) ([place])     Past Surgical History:  Procedure Laterality Date  . CHOLECYSTECTOMY    . PERMANENT ILEOSTOMY      Current Outpatient Prescriptions  Medication Sig Dispense Refill  . acetaZOLAMIDE (DIAMOX) 500 MG capsule Take 1 capsule (  500 mg total) by mouth 3 (three) times daily. 90 capsule 11  . ALPRAZolam (XANAX) 0.5 MG tablet Take 1 tablet (0.5 mg total) by mouth daily as needed for anxiety. 15 tablet 0  . Multiple Vitamin (MULTIVITAMIN WITH MINERALS) TABS tablet Take 1 tablet by mouth daily at 12 noon.    Marland Kitchen OLANZapine-FLUoxetine (SYMBYAX) 3-25 MG capsule Take 1 capsule by mouth at bedtime. 30 capsule 2   No current facility-administered medications for this visit.     Allergies as of 03/21/2016  . (No Known Allergies)    Vitals: BP 109/71  (BP Location: Right Arm, Patient Position: Sitting, Cuff Size: Large)   Pulse 72   Ht 5' 2"  (1.575 m)   Wt 209 lb 12.8 oz (95.2 kg)   BMI 38.37 kg/m  Last Weight:  Wt Readings from Last 1 Encounters:  03/21/16 209 lb 12.8 oz (95.2 kg)   Last Height:   Ht Readings from Last 1 Encounters:  03/21/16 5' 2"  (1.575 m)    Speech:  Speech is normal; fluent and spontaneous with normal comprehension.  Cognition:  The patient is oriented to person, place, and time;  Cranial Nerves:  The pupils are equal, round, and reactive to light. No APD, R>L disk edema(significantly improved). Visual fields are full to finger confrontation. Extraocular movements are intact. Trigeminal sensation is intact and the muscles of mastication are normal. The face is symmetric. The palate elevates in the midline. Hearing intact. Voice is normal. Shoulder shrug is normal. The tongue has normal motion without fasciculations.   Motor Observation:  No asymmetry, no atrophy, and no involuntary movements noted. Tone:  Normal muscle tone.   Posture:  Posture is normal. normal erect   Strength:  Strength is V/V in the upper and lower limbs.     Assessment/Plan: 25 year old female with papilledema, dizziness, positional headache, vision and hearing changes. Opening pressure was 34.5, improved on diamox. Abnormal MRI with abnormal signal within the medial temporal lobes bilaterally predominantly involving the hippocampal structures. 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 Repeat MRi showed subtle signal changes in the mesial temporal lobes unchanged, although initially worrisome for an  infectious or limbic encephalopathy, the stability and the absence of specific clinical symptoms make this less likely.  Discussed weight loss as is critical. She prefers weight watchers offered referral to weight loss clinic will refer to dietician.  Continue diamox. Discussed teratogenicity, she is getting married next yuear, advised weight loss before children, although diamox is generally considered compatible with pregnancy no medication is entirely safe Bmp for long-term use of high risk medication Weight loss Should proceed to ED if vision significantly worsens Follow up with [name] was very positive  Discussed the following: To prevent or relieve headaches, try the following:  Cool Compress. Lie down and place a cool compress on your head.   Avoid headache triggers. If certain foods or odors seem to have triggered your migraines in the past, avoid them. A headache diary might help you identify triggers.   Include physical activity in your daily routine. Try a daily walk or other moderate aerobic exercise.   Manage stress. Find healthy ways to cope with the stressors, such as delegating tasks on your to-do list.  Practice relaxation techniques. Try deep breathing, yoga, massage and visualization.   Eat regularly. Eating regularly scheduled meals and maintaining a healthy diet might help prevent headaches. Also, drink plenty of fluids.   Follow a regular sleep schedule. Sleep deprivation might contribute to headaches  Consider biofeedback. With this mind-body technique, you learn to control certain bodily functions - such as muscle tension, heart rate and blood pressure - to prevent headaches or reduce headache pain.    Proceed to emergency room if you experience new or worsening symptoms or symptoms do not resolve, if you have new neurologic symptoms or if headache is severe, or for any concerning symptom.  Sarina Ill, MD  [facility] [address] [place], [place] [address]  Phone [phone] Fax [phone]  A total of 40 minutes was spent face-to-face with this patient. Over half this time was spent on counseling patient on the IIH and Abnormal signal within the medial temporal lobes diagnosis, obesity and different diagnostic and therapeutic options available.

## 2016-03-22 ENCOUNTER — Telehealth: Payer: Self-pay | Admitting: *Deleted

## 2016-03-22 LAB — BASIC METABOLIC PANEL
BUN/Creatinine Ratio: 10 (ref 9–23)
BUN: 8 mg/dL (ref 6–20)
CO2: 17 mmol/L — ABNORMAL LOW (ref 18–29)
Calcium: 8.7 mg/dL (ref 8.7–10.2)
Chloride: 108 mmol/L — ABNORMAL HIGH (ref 96–106)
Creatinine, Ser: 0.84 mg/dL (ref 0.57–1.00)
GFR calc Af Amer: 110 mL/min/{1.73_m2} (ref >59)
GFR calc non Af Amer: 96 mL/min/{1.73_m2} (ref >59)
Glucose: 93 mg/dL (ref 65–99)
Potassium: 3.9 mmol/L (ref 3.5–5.2)
Sodium: 140 mmol/L (ref 134–144)

## 2016-03-22 NOTE — Telephone Encounter (Signed)
Tried calling pt. Patient picked up but call got disconnected. Tried calling back but went to VM.   If pt calls, ok to inform pt labs unremarkable per [name].

## 2016-03-22 NOTE — Telephone Encounter (Signed)
Advised patient per previous message that her labs were unremarkable.

## 2016-03-22 NOTE — Telephone Encounter (Signed)
-----   Message from [name], MD sent at 03/22/2016  8:03 AM EDT ----- Labs unremarkable. thanks

## 2016-04-08 ENCOUNTER — Encounter: Payer: BLUE CROSS/BLUE SHIELD | Attending: Neurology | Admitting: Dietician

## 2016-04-08 ENCOUNTER — Encounter: Payer: Self-pay | Admitting: Dietician

## 2016-04-08 DIAGNOSIS — E669 Obesity, unspecified: Secondary | ICD-10-CM | POA: Diagnosis not present

## 2016-04-08 DIAGNOSIS — Z713 Dietary counseling and surveillance: Secondary | ICD-10-CM | POA: Diagnosis not present

## 2016-04-08 DIAGNOSIS — G932 Benign intracranial hypertension: Secondary | ICD-10-CM | POA: Diagnosis not present

## 2016-04-08 DIAGNOSIS — IMO0001 Reserved for inherently not codable concepts without codable children: Secondary | ICD-10-CM

## 2016-04-08 NOTE — Patient Instructions (Addendum)
Vitamin D3 2000 units daily. Consider increase your exercise to 30 minutes most days. Consider rethinking your beverages.  Recommend drinking more water. Continue to be mindful about your food choices.  Eat slowly and stop when you are satisfied.  Before a Snack, ask am I hungry or eating for another reason.   What can you do if you are stressed or anxious rather than eat?  Get up and do something.  Reading  Art  Go for a walk. Eat at the table away from the TV and other electronics. You are doing a great job with the changes that you have made! Journaling can be very helpful.

## 2016-04-08 NOTE — Progress Notes (Signed)
Medical Nutrition Therapy:  Appt start time: 1300 end time:  1400.   Assessment:  Primary concerns today: Patient is here alone.  She has been referred for weight management due to idiopathic intracranial HTN.  Her neurologist wants to lose 50-60 pounds.  She has been using My Fitness Pal and tracking intake and has been at about 1200 calories for the past 2 weeks.  She has lost from 209 lbs to 204 lbs.  She states that she has been satisfied.  She states that she was told that she had low vitamin D in the past but did nothing about it.  Other hx includes ulcerative colitis with removal of entire colon and rectum 08/2014.  She reports being very restrictive prior to her surgery and followed an organic dairy free, gluten free diet and after surgery ate everything because food no longer made her sick.  She also is being treated for OCD with anxiety.  She has an ileostomy.  Weight Hx: Lowest adult weight 135 lbs in 2014 due to illness. She wants to return to this weight so long as she can feel healthy.  Highest adult weight 220 lbs 2011. Weight 2 weeks ago 209 lbs Weight today 204.6.  TANITA  BODY COMP RESULTS 04/08/16 204.6   BMI (kg/m^2) 37.4   Fat Mass (lbs) 93.6   Fat Free Mass (lbs) 111   Total Body Water (lbs) 80.8   Patient lives with her boyfriend and his parents house and they share shopping and cooking.  She works in the Engineer, production at [employer].   She is getting married in one year and would like to lose the weight prior tot that time.  She note stress eating.  Preferred Learning Style:   No preference indicated   Learning Readiness:   Ready  MEDICATIONS: see list   DIETARY INTAKE:  Usual eating pattern includes 3 meals and 2-3 snacks per day. Foods tolerated include peeled apples, melon, berries, roasted vegetables, nuts, seeds.    Avoided foods include celery, other fruit, raw foods except small amounts of greens, pineapple, and mushrooms..    24-hr  recall:  B ( AM): 80 calorie vanilla yogurt, 1/4 cup almond granola, coffee with splenda and sugar free creamer  Snk ( AM): occasional 100 calorie pumpkin biscotti L ( PM): 100 calorie sandwhich thin with laughing cow cheese, [place], greens, tomato Snk ( PM): laughing cow snack or mini pretzels or other 100 calorie snack D ( PM): 3 ounces chicken, salad greens, other raw veges and lite caesar dressing that she measures Snk ( PM): pretzels occasionally but tries not to Beverages: "a lot of diet soda" 4-5 cans per day.  Has tried to give it up but only could stop when she was "deathly ill".  32 ounces water most days, coffee with splenda and fat free creamer  Usual physical activity: none  Estimated energy needs: 1200-1400 calories 65 g protein  Progress Towards Goal(s):  In progress.   Nutritional Diagnosis:  [place]-3.3 Overweight/obesity As related to excessive calories.  As evidenced by BMI >37.    Intervention:  Nutrition counseling/education related to weight loss.  Discussed importance of increased physical activity.  Discussed food ideas to increase the variety in her diet.  Vitamin D3 2000 units daily. Consider increase your exercise to 30 minutes most days. Consider rethinking your beverages.  Recommend drinking more water. Continue to be mindful about your food choices.  Eat slowly and stop when you are satisfied.  Before  a Snack, ask am I hungry or eating for another reason.   What can you do if you are stressed or anxious rather than eat?  Get up and do something.  Reading  Art  Go for a walk. Eat at the table away from the TV and other electronics. You are doing a great job with the changes that you have made! Journaling can be very helpful.  Teaching Method Utilized:  Visual Auditory Hands on  Handouts given during visit include:  My plate  Snack list  Meal plan card  Barriers to learning/adherence to lifestyle change: none  Demonstrated degree of  understanding via:  Teach Back   Monitoring/Evaluation:  Dietary intake, exercise, and body weight prn.

## 2016-05-20 ENCOUNTER — Other Ambulatory Visit (HOSPITAL_COMMUNITY): Payer: Self-pay | Admitting: Psychiatry

## 2016-05-20 DIAGNOSIS — F429 Obsessive-compulsive disorder, unspecified: Secondary | ICD-10-CM

## 2016-05-24 NOTE — Telephone Encounter (Signed)
Medication refill request - Left a message for patient after she left one needing a new Symbyax order that this nurse called her [facility] pharmacy and verified they still have one refill on file. Requested she call back if any problems filling the order.

## 2016-06-19 ENCOUNTER — Other Ambulatory Visit (HOSPITAL_COMMUNITY): Payer: Self-pay | Admitting: Psychiatry

## 2016-06-19 DIAGNOSIS — F429 Obsessive-compulsive disorder, unspecified: Secondary | ICD-10-CM

## 2016-06-24 ENCOUNTER — Other Ambulatory Visit (HOSPITAL_COMMUNITY): Payer: Self-pay

## 2016-06-24 DIAGNOSIS — F428 Other obsessive-compulsive disorder: Secondary | ICD-10-CM

## 2016-06-24 MED ORDER — OLANZAPINE-FLUOXETINE HCL 3-25 MG PO CAPS: 1.0000 | ORAL_CAPSULE | Freq: Every day | ORAL | 0 refills | Status: DC

## 2016-06-30 ENCOUNTER — Ambulatory Visit (HOSPITAL_COMMUNITY): Payer: Self-pay | Admitting: Psychiatry

## 2016-07-11 ENCOUNTER — Encounter (HOSPITAL_COMMUNITY): Payer: Self-pay | Admitting: Psychiatry

## 2016-07-11 ENCOUNTER — Ambulatory Visit (INDEPENDENT_AMBULATORY_CARE_PROVIDER_SITE_OTHER): Payer: BLUE CROSS/BLUE SHIELD | Admitting: Psychiatry

## 2016-07-11 DIAGNOSIS — Z79899 Other long term (current) drug therapy: Secondary | ICD-10-CM

## 2016-07-11 DIAGNOSIS — Z818 Family history of other mental and behavioral disorders: Secondary | ICD-10-CM

## 2016-07-11 DIAGNOSIS — F428 Other obsessive-compulsive disorder: Secondary | ICD-10-CM | POA: Diagnosis not present

## 2016-07-11 DIAGNOSIS — Z87891 Personal history of nicotine dependence: Secondary | ICD-10-CM

## 2016-07-11 MED ORDER — OLANZAPINE-FLUOXETINE HCL 3-25 MG PO CAPS: 1.0000 | ORAL_CAPSULE | Freq: Every day | ORAL | 2 refills | Status: DC

## 2016-07-11 NOTE — Progress Notes (Signed)
[facility] MD/PA/NP OP Progress Note  07/11/2016 8:52 AM Diana Page  MRN:  [id number]  Chief Complaint:  Chief Complaint    Follow-up     Subjective:  I am doing good.  I went [place] on Christmas to visit my mother.  I also started school.  HPI: Diana Page came for her follow-up appointment.  She is taking her medication and reported no side effects.  She went to see her mother on Christmas in [place] but she did not enjoy due to extreme cold weather.  Recently she started school during bachelors at [employer].  She really enjoying studying.  She continues to work at [employer].  She denies any irritability, anger, mania or any psychosis.  She denies any major panic attack.  Her obsessive thinking is stable and she denies any major issues.  She denies any racing thoughts .  She has not taken Xanax in past few months.  In September she saw a neurologist for her chronic headaches but there were no changes in her medication.  She sleeping good.  She denies any tremors shakes or any EPS.  She denies any crying spells or any feeling of hopelessness.  Her energy level is good.  Patient denies drinking alcohol or using any illegal substances.    Visit Diagnosis:    ICD-9-CM ICD-10-CM   1. Other obsessive-compulsive disorders 300.3 F42.8 OLANZapine-FLUoxetine (SYMBYAX) 3-25 MG capsule    Past Psychiatric History: Reviewed.  Past Medical History:  Past Medical History:  Diagnosis Date  . Obsessive compulsive disorder 06/14  . Ostomy nurse consultation   . Pancreatitis   . Pancreatitis    hx of   . UC (ulcerative colitis) ([place])     Past Surgical History:  Procedure Laterality Date  . CHOLECYSTECTOMY    . PERMANENT ILEOSTOMY      Family Psychiatric History: Reviewed.  Family History:  Family History  Problem Relation Age of Onset  . OCD Mother   . OCD Sister   . OCD Brother     Social History:  Social History   Social History  . Marital status: Single    Spouse name: N/A  .  Number of children: 0  . Years of education: Ba   Social History Main Topics  . Smoking status: Former Smoker    Quit date: 06/27/2009  . Smokeless tobacco: Never Used  . Alcohol use No  . Drug use: No  . Sexual activity: Not Asked   Other Topics Concern  . None   Social History Narrative   Lives at home with boyfriend, Lennette Bihari.   Caffeine use: Tea occass    Allergies: No Known Allergies  No results found for this or any previous visit (from the past 2160 hour(s)). Metabolic Disorder Labs: No results found for: HGBA1C, MPG No results found for: PROLACTIN No results found for: CHOL, TRIG, HDL, CHOLHDL, VLDL, LDLCALC   Current Medications: Current Outpatient Prescriptions  Medication Sig Dispense Refill  . acetaZOLAMIDE (DIAMOX) 500 MG capsule Take 1 capsule (500 mg total) by mouth 3 (three) times daily. 90 capsule 11  . ALPRAZolam (XANAX) 0.5 MG tablet Take 1 tablet (0.5 mg total) by mouth daily as needed for anxiety. 15 tablet 0  . Multiple Vitamin (MULTIVITAMIN WITH MINERALS) TABS tablet Take 1 tablet by mouth daily at 12 noon.    Marland Kitchen OLANZapine-FLUoxetine (SYMBYAX) 3-25 MG capsule Take 1 capsule by mouth at bedtime. 30 capsule 2   No current facility-administered medications for this visit.  Neurologic: Headache: Yes Seizure: No Paresthesias: No  Musculoskeletal: Strength & Muscle Tone: within normal limits Gait & Station: normal Patient leans: N/A  Psychiatric Specialty Exam: Review of Systems  Constitutional: Negative.   HENT: Negative.   Musculoskeletal: Negative.   Skin: Negative.   Neurological: Positive for headaches. Negative for tremors.  Psychiatric/Behavioral: Negative.     Blood pressure 126/74, pulse 82, height 5' 2"  (1.575 m), weight 203 lb 12.8 oz (92.4 kg).Body mass index is 37.28 kg/m.  General Appearance: Casual and Well Groomed  Eye Contact:  Good  Speech:  Clear and Coherent  Volume:  Normal  Mood:  Euthymic  Affect:  Appropriate and  Congruent  Thought Process:  Goal Directed  Orientation:  Full (Time, Place, and Person)  Thought Content: WDL and Logical   Suicidal Thoughts:  No  Homicidal Thoughts:  No  Memory:  Immediate;   Good Recent;   Good Remote;   Good  Judgement:  Good  Insight:  Good  Psychomotor Activity:  Normal  Concentration:  Concentration: Good and Attention Span: Good  Recall:  Good  Fund of Knowledge: Good  Language: Good  Akathisia:  No  Handed:  Right  AIMS (if indicated):  0  Assets:  Communication Skills Desire for Improvement Financial Resources/Insurance Housing Physical Health Resilience Vocational/Educational  ADL's:  Intact  Cognition: WNL  Sleep:  good   Assessment: Obsessive-compulsive disorder.  Major depressive disorder, recurrent mild.  Plan: Patient is a stable on her current Symbyax.  She has no tremors shakes or any EPS.  Her last blood work which was done in September was normal.  She has not used Xanax which was given few months ago.  Discussed medication side effects and benefits.  Recommended to call us back if she has any question, concern if she feels worsening of the symptom.  Follow-up in 3 months.    Curlee Bogan T., MD 07/11/2016, 8:52 AM

## 2016-07-25 ENCOUNTER — Ambulatory Visit (INDEPENDENT_AMBULATORY_CARE_PROVIDER_SITE_OTHER): Payer: BLUE CROSS/BLUE SHIELD | Admitting: Neurology

## 2016-07-25 ENCOUNTER — Telehealth: Payer: Self-pay | Admitting: Neurology

## 2016-07-25 ENCOUNTER — Encounter: Payer: Self-pay | Admitting: Neurology

## 2016-07-25 DIAGNOSIS — G932 Benign intracranial hypertension: Secondary | ICD-10-CM

## 2016-07-25 NOTE — Patient Instructions (Signed)
Remember to drink plenty of fluid, eat healthy meals and do not skip any meals. Try to eat protein with a every meal and eat a healthy snack such as fruit or nuts in between meals. Try to keep a regular sleep-wake schedule and try to exercise daily, particularly in the form of walking, 20-30 minutes a day, if you can.   As far as your medications are concerned, I would like to suggest: Continue Acteazolamide  As far as diagnostic testing: [facility]  I would like to see you back in 6 months, sooner if we need to. Please call/email [place] with any interim questions, concerns, problems, updates or refill requests.   Our phone number is [phone]. We also have an after hours call service for urgent matters and there is a physician on-call for urgent questions. For any emergencies you know to call 911 or go to the nearest emergency room

## 2016-07-25 NOTE — Progress Notes (Signed)
[name]    Provider:  [name] Referring Provider: Nolene Ebbs, MD Primary Care Physician:  Philis Fendt, MD   CC: Headache and vision loss, dizziness  Interval history 07/25/2016: 26 year old here for follow up on IIH on acetazolamide and obesity. She is getting married in November. No dizziness. Rare headaches while on the medication however she is on 1532m diamox a day and having a difficult time losing weight which is critical in IIH. Her headaches are better,  1-2 since being last seen. No vision changes, no dizziness, no hearing changes. She is following weight watchers now and she saw a dietician but still having difficulty with weight loss.  She stopped eating meat and is worried about her B12 and iron. No snoring, no morning headache. She takes 15021macetazolamide. She has missed 3 periods but 2 pregnancy tests are negative. They use condoms.  She is on 150011miamox daily and I can decrease this dose if she can lose 10% of her body weight, we may be able to get her off of medication with even more weight loss. Will refer to the [facility] for weight loss. She is having a difficult time with weight loss and I recommend the [facility] Mariaiscussed the role of weight in IIH and vision loss.   Interval history 03/21/2016:   [age]ar old female with papilledema, dizziness, positional headache, vision and hearing changes. Opening pressure was 34.5, improved on diamox. Abnormal MRI with abnormal signal within the medial temporal lobes bilaterally predominantly involving the hippocampal structures. She was working hard, she was not drinking water and she was lifting a lot of things and she was running all day and she felt lightheaded and dizzy and fatigued. She went and sat down for 5 minutes and she was ok. She was not drinking any water. She has one headache that lasted a whole day and a whole night it was terrible. She  tried giving up caffeine. Likely caffeine withdrawal. Restared caffeine and fine. Vision has been fine. No snoring, no morning headaches. She takes 1500m58metazolamide.   Interval history 09/16/2015: She is having worsening blurry vision. This started within the last month. Things are not as crisp. No pressure. She is also hearing low level, wooshing in the ears. Worse when she is laying down and then stands. No headaches, no pressure. No side effects from the diamox. She has not been able to lose weight. Will keep her on the current dose and then if weight loss of 10% will decreasing the diamox. Need to repeat MRi of the brain due to new worsneing vision and hearing issues given previously seen Abnormal signal within the medial temporal lobes bilaterally predominantly involving the hippocampal structures.  MRI 09/26/2015: FINDINGS:  The brain parenchyma shows no significant abnormalities except subtle prominence and thickening of the right hippocampus and medial temporal lobe which is less prominent compared to the previous scan and is of unclear significance. No other structural lesion, tumor infarcts are noted. Diffusion weighted imaging is negative for acute ischemia. Susceptibility weighted imaging is negative for microhemorrhages. Subarachnoid spaces and ventricular system appear normal. Extraction brain structures up and normal. Calvarium shows no abnormalities. The pituitary gland and cerebellar tonsils appear normal. Flow voids of the large vessels are intact and circulation appear to be patent. Orbits and paranasal sinuses appear normal. Visualized portions of the cervical spine appears normal. Contrast images do not result in any abnormal areas of  enhancement.     IMPRESSION: Slightly abnormal MRI scan of the brain showing subtle prominence of the right medial temporal lobe which is of unclear significance. Overall no significant change compared with previous MRI dated  01/16/2015.  Interval update: Opening pressure of LP was 34.5. 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 the thumping and pressure in her head have improved. The peripheral vision changes are gone, no headaches. She needs follow up with ophthalmology. She needs an ophthalmologist to follow in the area, will refer to [name]. She denies any current side effects from the diamox.   HPI: Diana Page is a 26 y.o. female here as a referral from Dr. Jeanie Cooks for dizziness.  PMHx obesity. She was seen in the ED for acute onset headache and dizziness with decreased peripheral vision. She had an abnormal MRI and was called back to the ED for lumbar puncture which showed negative/normal CSF analysis. Unfortunately the LP did not include an opening pressure. She felt much better after the LP however. Currently if she stands up, she gets a thumping in the head. She gets stars in her right >Left peripheral vision. She gets pressure in the head all over. If she lays on her stomach, the pressure is worse and she can feel her heartbeat in her head. She went to optometry who diagnosed edema of the optic disks. Right eye has blurry vision which is persistent. She has had severe headaches. The dizziness has resolved after the LP. Symptoms felt much better after LP. Denies seizure-like events, staring, altered awareness. Symptoms are daily. No other associated focal neurologic deficits. No neck pain.   Reviewed notes, labs and imaging from outside physicians, which showed: MRi brain 12/27/2014: Personally reviewed images: 1. Abnormal signal within the medial temporal lobes bilaterally predominantly involving the hippocampal structures. This is concerning for infectious process including herpes  encephalitis. Other viral encephalitides can give a similar picture. Limbic encephalitis can look like this, but is typically associated with lung cancer. Alternatively, this could be related to seizure activity. The patient has no history of seizures. 2. Question abnormal signal within the anterior aspect of the right optic nerve, potentially related to same process.   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 Widening of the optic nerve sheath. This can be observed with intracranial hypertension, and is commonly seen with pseudotumor cerebri. 2. The subtle signal changes in the mesial temporal lobes appear unchanged. Although initially worrisome for an infectious or limbic encephalopathy, the stability and the absence of specific clinical symptoms make this less likely. If clinically indicated, consider follow-up evaluation or follow-up CSF analysis  Labs:   CSF: protein, glucose, diff, cell count normal TSH, B12, B1, HIV wnl CRP, ESR elevated CMP unremarkable CBC w/ anemia   Review of Systems: Patient complains of symptoms per HPI as well as the following symptoms: No CP, no SOB. Pertinent negatives per HPI. All others negative.   Social History   Social History  . Marital status: Single    Spouse name: N/A  .  Number of children: 0  . Years of education: Ba   Occupational History  . Not on file.   Social History Main Topics  . Smoking status: Former Smoker    Quit date: 06/27/2009  . Smokeless tobacco: Never Used  . Alcohol use No  . Drug use: No  . Sexual activity: Not on file   Other Topics Concern  . Not on file   Social History  Narrative   Lives at home with boyfriend, Lennette Bihari.   Caffeine use: Tea occass    Family History  Problem Relation Age of Onset  . OCD Mother   . OCD Sister   . OCD Brother     Past Medical History:  Diagnosis Date  . Obsessive compulsive disorder 06/14  . Ostomy nurse consultation   . Pancreatitis   . Pancreatitis    hx of   . UC (ulcerative colitis) ([place])     Past Surgical History:  Procedure Laterality Date  . CHOLECYSTECTOMY    . PERMANENT ILEOSTOMY      Current Outpatient Prescriptions  Medication Sig Dispense Refill  . acetaZOLAMIDE (DIAMOX) 500 MG capsule Take 1 capsule (500 mg total) by mouth 3 (three) times daily. 90 capsule 11  . ALPRAZolam (XANAX) 0.5 MG tablet Take 1 tablet (0.5 mg total) by mouth daily as needed for anxiety. 15 tablet 0  . Multiple Vitamin (MULTIVITAMIN WITH MINERALS) TABS tablet Take 1 tablet by mouth daily at 12 noon.    Marland Kitchen OLANZapine-FLUoxetine (SYMBYAX) 3-25 MG capsule Take 1 capsule by mouth at bedtime. 30 capsule 2   No current facility-administered medications for this visit.     Allergies as of 07/25/2016  . (No Known Allergies)    Vitals: BP 109/66 (BP Location: Right Arm, Patient Position: Sitting, Cuff Size: Large)   Pulse 81   Ht 5' 2"  (1.575 m)   Wt 198 lb (89.8 kg)   BMI 36.21 kg/m  Last Weight:  Wt Readings from Last 1 Encounters:  07/25/16 198 lb (89.8 kg)   Last Height:   Ht Readings from Last 1 Encounters:  07/25/16 5' 2"  (1.575 m)     Speech:  Speech is normal; fluent and spontaneous with normal comprehension.  Cognition:  The patient is oriented to person, place, and time;  Cranial Nerves:  The pupils are equal, round, and reactive to light. No APD, R>L disk edema(significantly improved from initial visit and now stable). Visual fields are full to finger confrontation. Extraocular movements are intact. Trigeminal sensation is intact and the muscles of mastication are normal. The face is symmetric.  The palate elevates in the midline. Hearing intact. Voice is normal. Shoulder shrug is normal. The tongue has normal motion without fasciculations.   Motor Observation:  No asymmetry, no atrophy, and no involuntary movements noted. Tone:  Normal muscle tone.   Posture:  Posture is normal. normal erect   Strength:  Strength is V/V in the upper and lower limbs.     Assessment/Plan: 26 year old female with papilledema, dizziness, positional headache, vision and hearing changes, obesity. Opening pressure was 34.5, improved on diamox but having difficulty with weight loss. Abnormal MRI with abnormal signal within the medial temporal lobes bilaterally predominantly involving the hippocampal structures. This was concerning for infectious process including viral encephalitis, but but csf was negative, no seizure activity.Abnormal MRI of the orbits with and without contrast showing widening of the optic nerve sheaths and mild elevation of the optic nerve heads, findings are most consistent  with idiopathic intracranial hypertension (pseudotumor cerebri). Repeat MRi showed subtle signal changes in the mesial temporal lobes unchanged, although initially worrisome for an infectious or limbic encephalopathy, the stability and the absence of specific clinical symptoms make this less likely.  Discussed weight loss as is critical and discussed weight in the pathophysiology of IIH Continue diamox. Discussed teratogenicity, she is getting married next year, advised weight loss before children, although diamox is generally considered compatible with pregnancy no medication is entirely safe. Diana Page advised we can decrease the medication if she loses 10% of body weight and we may be able to entirely stop the diamox with a healthy weight. Will refer to [facility]. Should proceed to ED if vision significantly worsens Follow up with [name] was very positive, she sees him  regularly  Discussed the following again and at every appointment: To prevent or relieve headaches, try the following:  Cool Compress.Lie down and place a cool compress on your head.   Avoid headache triggers.If certain foods or odors seem to have triggered your migraines in the past, avoid them. A headache diary might help you identify triggers.   Include physical activity in your daily routine.Try a daily walk or other moderate aerobic exercise.   Manage stress.Find healthy ways to cope with the stressors, such as delegating tasks on your to-do list.   Practice relaxation techniques.Try deep breathing, yoga, massage and visualization.   Eat regularly.Eating regularly scheduled meals and maintaining a healthy diet might help prevent headaches. Also, drink plenty of fluids.   Follow a regular sleep schedule.Sleep deprivation might contribute to headaches  Consider biofeedback.With this mind-body technique, you learn to control certain bodily functions -such as muscle tension, heart rate and blood pressure -to prevent headaches or reduce headache pain.    Proceed to emergency room if you experience new or worsening symptoms or symptoms do not resolve, if you have new neurologic symptoms or if headache is severe, or for any concerning symptom.  Sarina Ill, MD  [facility] [address] [place], [place] [address]  Phone [phone] Fax [phone]  A total of 30 minutes was spent face-to-face with this patient. Over half this time was spent on counseling patient on the IIH, obesity  diagnosis, obesity and different diagnostic and therapeutic options available.

## 2016-07-25 NOTE — Telephone Encounter (Signed)
Hinton Dyer, I have placed a referral to the [facility] for this patient. I spoke to [facility] today an dinformed her a referral will be coming, please call and ensure [place] Mam has seen referral thanks [phone]

## 2016-07-26 NOTE — Telephone Encounter (Signed)
Noted [place] saw Referral. Thanks Hinton Dyer

## 2016-08-02 ENCOUNTER — Encounter (INDEPENDENT_AMBULATORY_CARE_PROVIDER_SITE_OTHER): Payer: BLUE CROSS/BLUE SHIELD | Admitting: Family Medicine

## 2016-08-18 ENCOUNTER — Encounter (INDEPENDENT_AMBULATORY_CARE_PROVIDER_SITE_OTHER): Payer: BLUE CROSS/BLUE SHIELD | Admitting: Family Medicine

## 2016-09-07 ENCOUNTER — Encounter (INDEPENDENT_AMBULATORY_CARE_PROVIDER_SITE_OTHER): Payer: Self-pay | Admitting: Family Medicine

## 2016-09-07 ENCOUNTER — Ambulatory Visit (INDEPENDENT_AMBULATORY_CARE_PROVIDER_SITE_OTHER): Payer: BLUE CROSS/BLUE SHIELD | Admitting: Family Medicine

## 2016-09-07 VITALS — BP 111/68 | HR 65 | Temp 97.5°F | Resp 12 | Ht 62.0 in | Wt 200.0 lb

## 2016-09-07 DIAGNOSIS — Z6836 Body mass index (BMI) 36.0-36.9, adult: Secondary | ICD-10-CM

## 2016-09-07 DIAGNOSIS — R0602 Shortness of breath: Secondary | ICD-10-CM

## 2016-09-07 DIAGNOSIS — E669 Obesity, unspecified: Secondary | ICD-10-CM | POA: Diagnosis not present

## 2016-09-07 DIAGNOSIS — Z1389 Encounter for screening for other disorder: Secondary | ICD-10-CM

## 2016-09-07 DIAGNOSIS — R5383 Other fatigue: Secondary | ICD-10-CM

## 2016-09-07 DIAGNOSIS — D6489 Other specified anemias: Secondary | ICD-10-CM

## 2016-09-07 DIAGNOSIS — Z9189 Other specified personal risk factors, not elsewhere classified: Secondary | ICD-10-CM | POA: Diagnosis not present

## 2016-09-07 DIAGNOSIS — K51818 Other ulcerative colitis with other complication: Secondary | ICD-10-CM

## 2016-09-07 DIAGNOSIS — Z833 Family history of diabetes mellitus: Secondary | ICD-10-CM | POA: Diagnosis not present

## 2016-09-07 DIAGNOSIS — Z0289 Encounter for other administrative examinations: Secondary | ICD-10-CM

## 2016-09-07 DIAGNOSIS — Z1331 Encounter for screening for depression: Secondary | ICD-10-CM

## 2016-09-07 NOTE — Progress Notes (Signed)
Office: [phone]  /  Fax: [phone]   HPI:   Chief Complaint: OBESITY  Diana Page (MR# [id number]) is a 26 y.o. female who presents on 09/07/2016 for obesity evaluation and treatment. Current BMI is Body mass index is 36.58 kg/m.Marland Kitchen Diana Page has struggled with obesity for years and has been unsuccessful in either losing weight or maintaining long term weight loss. Diana Page attended our information session and states she is currently in the action stage of change and ready to dedicate time achieving and maintaining a healthier weight.  Diana Page states her family eats meals together she thinks her family will eat healthier with  her her desired weight loss is 49 she has been heavy most of  her life she started gaining weight in middle school her heaviest weight ever was 220 lbs. she has significant food cravings issues  she snacks frequently in the evenings she frequently makes poor food choices she has problems with excessive hunger  she frequently eats larger portions than normal  she has binge eating behaviors she struggles with emotional eating    Diana Page feels her energy is lower than it should be. This has worsened with weight gain and has not worsened recently. Diana Page admits to daytime somnolence and  admits to waking up still tired. Patient is at risk for obstructive sleep apnea. Patent has a history of symptoms of daytime Diana. Patient generally gets 7 hours of sleep per night, and states they generally have generally restful sleep. Snoring is present. Apneic episodes are not present. Epworth Sleepiness Score is 12  Dyspnea on exertion Diana Page notes increasing shortness of breath with exercising and seems to be worsening over time with weight gain. She notes getting out of breath sooner with activity than she used to. This has not gotten worse recently.Diana Page denies orthopnea.  Anemia Diana Page has a history of anemia.  She notes most severe when ulcerative colitis is  at it's worst. She is not on iron supplementation.   Ulcerative Colitis Diana Page was diagnosed at 26 years of age and has been on multiple medications without good result. 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 She has to avoid citrus and higher fiber foods.  Family History of Diabetes Diana Page has a family history of diabetes with mother and grandparents.  At risk for diabetes Diana Page is at higher than average risk for developing diabetes due to her obesity. She currently denies polyuria or polydipsia.  Depression Screen Diana Page's Food and Mood (modified PHQ-9) score was  Depression screen PHQ 2/9 09/07/2016  Decreased Interest 1  Down, Depressed, Hopeless 3  PHQ - 2 Score 4  Altered sleeping 1  Tired, decreased energy 3  Change in appetite 3  Feeling bad or failure about yourself  2  Trouble concentrating 1  Moving slowly or fidgety/restless 1  Suicidal thoughts 0  PHQ-9 Score 15    ALLERGIES: Allergies  Allergen Reactions  . Nsaids Other (See Comments)    Because of her UC    MEDICATIONS: Current Outpatient Prescriptions on File Prior to Visit  Medication Sig Dispense Refill  . acetaZOLAMIDE (DIAMOX) 500 MG capsule Take 1 capsule (500 mg total) by mouth 3 (three) times daily. 90 capsule 11  . ALPRAZolam (XANAX) 0.5 MG tablet Take 1 tablet (0.5 mg total) by mouth daily as needed for anxiety. 15 tablet 0  . OLANZapine-FLUoxetine (SYMBYAX) 3-25 MG capsule Take 1 capsule by mouth at bedtime. 30 capsule 2   No current facility-administered  medications on file prior to visit.     PAST MEDICAL HISTORY: Past Medical History:  Diagnosis Date  . Anemia   . Anxiety   . Back pain   . Depression   . Fatty liver   . Gallbladder problem   . GERD (gastroesophageal reflux disease)   . Hypertension    Intercranial Hypertension  . IBS (irritable bowel syndrome)   . Joint pain   . Obsessive compulsive disorder 06/14  . Ostomy nurse  consultation   . Pancreatitis   . Pancreatitis    hx of   . SOB (shortness of breath)   . UC (ulcerative colitis) ([place])     PAST SURGICAL HISTORY: Past Surgical History:  Procedure Laterality Date  . CHOLECYSTECTOMY    . LAPAROSCOPIC PARTIAL PROTECTOMY    . PERMANENT ILEOSTOMY      SOCIAL HISTORY: Social History  Substance Use Topics  . Smoking status: Former Smoker    Types: Cigarettes    Quit date: 06/27/2009  . Smokeless tobacco: Never Used  . Alcohol use No    FAMILY HISTORY: Family History  Problem Relation Age of Onset  . OCD Mother   . Diabetes Mother   . Depression Mother   . Anxiety disorder Mother   . Obesity Mother   . Obesity Father   . OCD Sister   . Depression Sister   . OCD Brother   . Anxiety disorder Brother     ROS: Review of Systems  Constitutional: Positive for malaise/Diana.  Respiratory: Positive for shortness of breath (on exertion).   Cardiovascular: Negative for orthopnea.  Genitourinary: Negative for frequency.  Endo/Heme/Allergies: Negative for polydipsia.    PHYSICAL EXAM: Blood pressure 111/68, pulse 65, temperature 97.5 F (36.4 C), temperature source Oral, resp. rate 12, height 5' 2"  (1.575 m), weight 200 lb (90.7 kg), last menstrual period 07/31/2016, SpO2 99 %. Body mass index is 36.58 kg/m. Physical Exam  Constitutional: She is oriented to person, place, and time. She appears well-developed and well-nourished.  Cardiovascular: Normal rate.   Pulmonary/Chest: Effort normal.  Musculoskeletal: Normal range of motion.  Neurological: She is oriented to person, place, and time.  Skin: Skin is warm and dry.  Psychiatric: She has a normal mood and affect. Her behavior is normal.  Vitals reviewed.   RECENT LABS AND TESTS: BMET    Component Value Date/Time   NA 140 03/21/2016 1024   K 3.9 03/21/2016 1024   CL 108 (H) 03/21/2016 1024   CO2 17 (L) 03/21/2016 1024   GLUCOSE 93 03/21/2016 1024   GLUCOSE 90 12/28/2014 0625     BUN 8 03/21/2016 1024   CREATININE 0.84 03/21/2016 1024   CALCIUM 8.7 03/21/2016 1024   GFRNONAA 96 03/21/2016 1024   GFRAA 110 03/21/2016 1024   No results found for: HGBA1C No results found for: INSULIN CBC    Component Value Date/Time   WBC 7.4 12/28/2014 0625   RBC 4.47 12/28/2014 0625   HGB 9.5 (L) 12/28/2014 0625   HCT 31.2 (L) 12/28/2014 0625   PLT 369 12/28/2014 0625   MCV 69.8 (L) 12/28/2014 0625   MCH 21.3 (L) 12/28/2014 0625   MCHC 30.4 12/28/2014 0625   RDW 15.8 (H) 12/28/2014 0625   LYMPHSABS 3.1 12/26/2014 2310   MONOABS 0.5 12/26/2014 2310   EOSABS 0.2 12/26/2014 2310   BASOSABS 0.0 12/26/2014 2310   Iron/TIBC/Ferritin/ %Sat No results found for: IRON, TIBC, FERRITIN, IRONPCTSAT Lipid Panel  No results found for: CHOL, TRIG,  HDL, CHOLHDL, VLDL, LDLCALC, LDLDIRECT Hepatic Function Panel     Component Value Date/Time   PROT 7.6 04/02/2015 1528   ALBUMIN 4.3 04/02/2015 1528   AST 19 04/02/2015 1528   ALT 21 04/02/2015 1528   ALKPHOS 207 (H) 04/02/2015 1528   BILITOT 0.2 04/02/2015 1528      Component Value Date/Time   TSH 2.921 12/28/2014 0645    ECG  shows NSR with a rate of 61 BPM INDIRECT CALORIMETER done today shows a VO2 of 205 and a REE of 1426.    ASSESSMENT AND PLAN: Other Diana - Plan: EKG 12-Lead, CBC With Differential, Comprehensive metabolic panel, Folate, Lipid Panel With LDL/HDL Ratio, T3, T4, free, TSH, Magnesium  Shortness of breath on exertion  Other ulcerative colitis with other complication (HCC) - Plan: Vitamin B12, VITAMIN D 25 Hydroxy (Vit-D Deficiency, Fractures), Magnesium  Anemia due to other cause, not classified - Plan: Anemia panel  Depression screening  Family history of diabetes mellitus  At risk for diabetes mellitus - Plan: Hemoglobin A1c, Insulin, random  Class 2 obesity without serious comorbidity with body mass index (BMI) of 36.0 to 36.9 in adult, unspecified obesity type  PLAN:  Diana Diana Page  was informed that her Diana may be related to obesity, depression or many other causes. Labs will be ordered, and in the meanwhile Kafi has agreed to work on diet, exercise and weight loss to help with Diana. Proper sleep hygiene was discussed including the need for 7-8 hours of quality sleep each night. A sleep study was not ordered based on symptoms and Epworth score.  Dyspnea on exertion Diana Page's shortness of breath appears to be obesity related and exercise induced. She has agreed to work on weight loss and gradually increase exercise to treat her exercise induced shortness of breath. If Diana Page follows our instructions and loses weight without improvement of her shortness of breath, we will plan to refer to pulmonology. We will monitor this condition regularly. Diana Page agrees to this plan.  Anemia We will check labs and Diana Page will follow up in our clinic in 2 weeks.  Ulcerative Colitis   Family History of Diabetes We will check labs and follow. Diana Page will follow up with our clinic in 2 weeks.  Diabetes risk counselling Diana Page was given extended (at least 15 minutes) diabetes prevention counseling today. She is 26 y.o. female and has risk factors for diabetes including obesity. We discussed intensive lifestyle modifications today with an emphasis on weight loss as well as increasing exercise and decreasing simple carbohydrates in her diet.  Depression Screen Diana Page had a mildly positive depression screening. Depression is commonly associated with obesity and often results in emotional eating behaviors. We will monitor this closely and work on CBT to help improve the non-hunger eating patterns. Referral to Psychology may be required if no improvement is seen as she continues in our clinic.  Obesity Diana Page is currently in the action stage of change and her goal is to continue with weight loss efforts She has agreed to follow the Category 2 plan +100 calories Diana Page has been instructed  to work up to a goal of 150 minutes of combined cardio and strengthening exercise per week for weight loss and overall health benefits. We discussed the following Behavioral Modification Stratagies today: increasing lean protein intake and increasing lower sugar fruits  Diana Page has agreed to follow up with our clinic in 2 weeks. She was informed of the importance of frequent follow up visits to maximize her success  with intensive lifestyle modifications for her multiple health conditions. She was informed we would discuss her lab results at her next visit unless there is a critical issue that needs to be addressed sooner. Diana Page agreed to keep her next visit at the agreed upon time to discuss these results.  I, Doreene Nest, am acting as scribe for Dennard Nip, MD  I have reviewed the above documentation for accuracy and completeness, and I agree with the above. -Dennard Nip, MD

## 2016-09-08 ENCOUNTER — Telehealth: Payer: Self-pay

## 2016-09-08 LAB — CBC WITH DIFFERENTIAL
Basophils Absolute: 0.1 10*3/uL (ref 0.0–0.2)
Basos: 1 %
EOS (ABSOLUTE): 0.1 10*3/uL (ref 0.0–0.4)
Eos: 2 %
Hemoglobin: 12.5 g/dL (ref 11.1–15.9)
Immature Grans (Abs): 0.1 10*3/uL (ref 0.0–0.1)
Immature Granulocytes: 1 %
Lymphocytes Absolute: 2.9 10*3/uL (ref 0.7–3.1)
Lymphs: 33 %
MCH: 26.7 pg (ref 26.6–33.0)
MCHC: 32.6 g/dL (ref 31.5–35.7)
MCV: 82 fL (ref 79–97)
Monocytes Absolute: 0.5 10*3/uL (ref 0.1–0.9)
Monocytes: 5 %
Neutrophils Absolute: 5.3 10*3/uL (ref 1.4–7.0)
Neutrophils: 58 %
RBC: 4.69 x10E6/uL (ref 3.77–5.28)
RDW: 14.8 % (ref 12.3–15.4)
WBC: 8.8 10*3/uL (ref 3.4–10.8)

## 2016-09-08 LAB — COMPREHENSIVE METABOLIC PANEL
ALT: 18 IU/L (ref 0–32)
AST: 17 IU/L (ref 0–40)
Albumin/Globulin Ratio: 1.4 (ref 1.2–2.2)
Albumin: 4.3 g/dL (ref 3.5–5.5)
Alkaline Phosphatase: 152 IU/L — ABNORMAL HIGH (ref 39–117)
BUN/Creatinine Ratio: 14 (ref 9–23)
BUN: 11 mg/dL (ref 6–20)
Bilirubin Total: <0.2 mg/dL (ref 0.0–1.2)
CO2: 18 mmol/L (ref 18–29)
Calcium: 8.8 mg/dL (ref 8.7–10.2)
Chloride: 106 mmol/L (ref 96–106)
Creatinine, Ser: 0.77 mg/dL (ref 0.57–1.00)
GFR calc Af Amer: 122 mL/min/{1.73_m2} (ref >59)
GFR calc non Af Amer: 106 mL/min/{1.73_m2} (ref >59)
Globulin, Total: 3 g/dL (ref 1.5–4.5)
Glucose: 82 mg/dL (ref 65–99)
Potassium: 4 mmol/L (ref 3.5–5.2)
Sodium: 140 mmol/L (ref 134–144)
Total Protein: 7.3 g/dL (ref 6.0–8.5)

## 2016-09-08 LAB — LIPID PANEL WITH LDL/HDL RATIO
Cholesterol, Total: 157 mg/dL (ref 100–199)
HDL: 34 mg/dL — ABNORMAL LOW (ref >39)
LDL Calculated: 94 mg/dL (ref 0–99)
LDl/HDL Ratio: 2.8 ratio units (ref 0.0–3.2)
Triglycerides: 143 mg/dL (ref 0–149)
VLDL Cholesterol Cal: 29 mg/dL (ref 5–40)

## 2016-09-08 LAB — ANEMIA PANEL
Ferritin: 29 ng/mL (ref 15–150)
Folate, Hemolysate: 429.7 ng/mL
Folate, RBC: 1122 ng/mL (ref >498)
Hematocrit: 38.3 % (ref 34.0–46.6)
Iron Saturation: 8 % — CL (ref 15–55)
Iron: 27 ug/dL (ref 27–159)
Retic Ct Pct: 1.5 % (ref 0.6–2.6)
Total Iron Binding Capacity: 354 ug/dL (ref 250–450)
UIBC: 327 ug/dL (ref 131–425)
Vitamin B-12: 568 pg/mL (ref 232–1245)

## 2016-09-08 LAB — MAGNESIUM: Magnesium: 2.2 mg/dL (ref 1.6–2.3)

## 2016-09-08 LAB — TSH: TSH: 1.74 u[IU]/mL (ref 0.450–4.500)

## 2016-09-08 LAB — HEMOGLOBIN A1C
Est. average glucose Bld gHb Est-mCnc: 100 mg/dL
Hgb A1c MFr Bld: 5.1 % (ref 4.8–5.6)

## 2016-09-08 LAB — VITAMIN D 25 HYDROXY (VIT D DEFICIENCY, FRACTURES): Vit D, 25-Hydroxy: 9.8 ng/mL — ABNORMAL LOW (ref 30.0–100.0)

## 2016-09-08 LAB — T3: T3, Total: 131 ng/dL (ref 71–180)

## 2016-09-08 LAB — FOLATE: Folate: 14 ng/mL (ref >3.0)

## 2016-09-08 LAB — T4, FREE: Free T4: 1.03 ng/dL (ref 0.82–1.77)

## 2016-09-08 LAB — INSULIN, RANDOM: INSULIN: 19.5 u[IU]/mL (ref 2.6–24.9)

## 2016-09-08 NOTE — Telephone Encounter (Signed)
Received faxed consult notes from [facility]. Per [name], "confirmed elevated opening pressure, normal MRI (aside from something unusual but stable in the medial temporal lobes), the VF is full again today, OCT RNFL shows some improvement, tolerating the Diamox and symptoms are controlled - 1500 mg per day, her preference is to cont this dose until she loses a significant amount of weight, will check again in 6 months - she is about to start seeing a weight loss specialist."

## 2016-09-17 ENCOUNTER — Encounter (INDEPENDENT_AMBULATORY_CARE_PROVIDER_SITE_OTHER): Payer: Self-pay | Admitting: Family Medicine

## 2016-09-21 ENCOUNTER — Ambulatory Visit (INDEPENDENT_AMBULATORY_CARE_PROVIDER_SITE_OTHER): Payer: BLUE CROSS/BLUE SHIELD | Admitting: Family Medicine

## 2016-09-21 ENCOUNTER — Encounter (INDEPENDENT_AMBULATORY_CARE_PROVIDER_SITE_OTHER): Payer: Self-pay | Admitting: Family Medicine

## 2016-09-21 VITALS — BP 91/60 | HR 70 | Temp 97.7°F | Resp 14 | Ht 62.0 in | Wt 198.0 lb

## 2016-09-21 DIAGNOSIS — E669 Obesity, unspecified: Secondary | ICD-10-CM | POA: Diagnosis not present

## 2016-09-21 DIAGNOSIS — Z6836 Body mass index (BMI) 36.0-36.9, adult: Secondary | ICD-10-CM

## 2016-09-21 DIAGNOSIS — E88819 Insulin resistance, unspecified: Secondary | ICD-10-CM

## 2016-09-21 DIAGNOSIS — E559 Vitamin D deficiency, unspecified: Secondary | ICD-10-CM

## 2016-09-21 DIAGNOSIS — Z9189 Other specified personal risk factors, not elsewhere classified: Secondary | ICD-10-CM | POA: Diagnosis not present

## 2016-09-21 DIAGNOSIS — E8881 Metabolic syndrome: Secondary | ICD-10-CM

## 2016-09-21 DIAGNOSIS — E66812 Obesity, class 2: Secondary | ICD-10-CM

## 2016-09-21 MED ORDER — METFORMIN HCL 500 MG PO TABS: 500.0000 mg | ORAL_TABLET | Freq: Every day | ORAL | 0 refills | Status: DC

## 2016-09-21 MED ORDER — VITAMIN D (ERGOCALCIFEROL) 1.25 MG (50000 UNIT) PO CAPS: 50000.0000 [IU] | ORAL_CAPSULE | ORAL | 0 refills | Status: DC

## 2016-09-21 NOTE — Progress Notes (Signed)
Office: [phone]  /  Fax: [phone]   HPI:   Chief Complaint: OBESITY Lupita is here to discuss her progress with her obesity treatment plan. She is following her eating plan approximately 0 % of the time and states she is exercising 0 minutes 0 times per week. Denean states she followed the category 2 plan for 4 days, found eating all her dinner difficult and she found she was snacking frequently. She would like more options for dinner. Her weight is 198 lb (89.8 kg) today and has had a weight loss of 2 pounds over a period of 2 weeks since her last visit. She has lost 2 lbs since starting treatment with [place].  Vitamin D deficiency Birtie has a new diagnosis of vitamin D deficiency. She is not currently taking vit D and has some malabsorption issues with total colectomy which may contribute. She admits fatigue and denies nausea, vomiting or muscle weakness.  Insulin Resistance Jemina has a diagnosis of insulin resistance based on her elevated fasting insulin level >5 at 19. Although Safiyah's blood glucose readings are still under good control, insulin resistance puts her at greater risk of metabolic syndrome and diabetes. She is being worked up for PCOS. She is not taking metformin currently and continues to work on diet and exercise to decrease risk of diabetes. She admits polyphagia.  At risk for diabetes Arthi is at higher than average risk for developing diabetes due to her obesity. She currently denies polyuria or polydipsia.   Wt Readings from Last 500 Encounters:  09/21/16 198 lb (89.8 kg)  09/07/16 200 lb (90.7 kg)  07/25/16 198 lb (89.8 kg)  04/08/16 204 lb 9.6 oz (92.8 kg)  03/21/16 209 lb 12.8 oz (95.2 kg)  09/16/15 218 lb 9.6 oz (99.2 kg)  05/05/15 215 lb 3.2 oz (97.6 kg)  04/02/15 202 lb 6.4 oz (91.8 kg)  12/31/14 207 lb 6.4 oz (94.1 kg)  12/27/14 208 lb (94.3 kg)  08/18/14 203 lb (92.1 kg)  08/06/14 203 lb (92.1 kg)  07/30/14 203 lb (92.1 kg)  07/18/14 206  lb 9.6 oz (93.7 kg)  06/30/14 210 lb (95.3 kg)     ALLERGIES: Allergies  Allergen Reactions  . Nsaids Other (See Comments)    Because of her UC    MEDICATIONS: Current Outpatient Prescriptions on File Prior to Visit  Medication Sig Dispense Refill  . acetaZOLAMIDE (DIAMOX) 500 MG capsule Take 1 capsule (500 mg total) by mouth 3 (three) times daily. 90 capsule 11  . ALPRAZolam (XANAX) 0.5 MG tablet Take 1 tablet (0.5 mg total) by mouth daily as needed for anxiety. 15 tablet 0  . OLANZapine-FLUoxetine (SYMBYAX) 3-25 MG capsule Take 1 capsule by mouth at bedtime. 30 capsule 2   No current facility-administered medications on file prior to visit.     PAST MEDICAL HISTORY: Past Medical History:  Diagnosis Date  . Anemia   . Anxiety   . Back pain   . Depression   . Fatty liver   . Gallbladder problem   . GERD (gastroesophageal reflux disease)   . Hypertension    Intercranial Hypertension  . IBS (irritable bowel syndrome)   . Joint pain   . Obsessive compulsive disorder 06/14  . Ostomy nurse consultation   . Pancreatitis   . Pancreatitis    hx of   . SOB (shortness of breath)   . UC (ulcerative colitis) ([place])     PAST SURGICAL HISTORY: Past Surgical History:  Procedure Laterality Date  .  CHOLECYSTECTOMY    . LAPAROSCOPIC PARTIAL PROTECTOMY    . PERMANENT ILEOSTOMY    . TOTAL COLECTOMY N/A     SOCIAL HISTORY: Social History  Substance Use Topics  . Smoking status: Former Smoker    Types: Cigarettes    Quit date: 06/27/2009  . Smokeless tobacco: Never Used  . Alcohol use No    FAMILY HISTORY: Family History  Problem Relation Age of Onset  . OCD Mother   . Diabetes Mother   . Depression Mother   . Anxiety disorder Mother   . Obesity Mother   . Obesity Father   . OCD Sister   . Depression Sister   . OCD Brother   . Anxiety disorder Brother     ROS: Review of Systems  Constitutional: Positive for malaise/fatigue and weight loss.  Gastrointestinal:  Negative for nausea and vomiting.  Genitourinary: Negative for frequency.  Musculoskeletal:       Negative muscle weakness  Endo/Heme/Allergies: Negative for polydipsia.       Polyphagia    PHYSICAL EXAM: Blood pressure 91/60, pulse 70, temperature 97.7 F (36.5 C), temperature source Oral, resp. rate 14, height 5' 2"  (1.575 m), weight 198 lb (89.8 kg), SpO2 98 %. Body mass index is 36.21 kg/m. Physical Exam  Constitutional: She is oriented to person, place, and time. She appears well-developed and well-nourished.  Cardiovascular: Normal rate.   Pulmonary/Chest: Effort normal.  Musculoskeletal: Normal range of motion.  Neurological: She is oriented to person, place, and time.  Skin: Skin is warm and dry.  Psychiatric: She has a normal mood and affect. Her behavior is normal.  Vitals reviewed.   RECENT LABS AND TESTS: BMET    Component Value Date/Time   NA 140 09/07/2016 1021   K 4.0 09/07/2016 1021   CL 106 09/07/2016 1021   CO2 18 09/07/2016 1021   GLUCOSE 82 09/07/2016 1021   GLUCOSE 90 12/28/2014 0625   BUN 11 09/07/2016 1021   CREATININE 0.77 09/07/2016 1021   CALCIUM 8.8 09/07/2016 1021   GFRNONAA 106 09/07/2016 1021   GFRAA 122 09/07/2016 1021   Lab Results  Component Value Date   HGBA1C 5.1 09/07/2016   Lab Results  Component Value Date   INSULIN 19.5 09/07/2016   CBC    Component Value Date/Time   WBC 8.8 09/07/2016 1021   WBC 7.4 12/28/2014 0625   RBC 4.69 09/07/2016 1021   RBC 4.47 12/28/2014 0625   HGB 9.5 (L) 12/28/2014 0625   HCT 38.3 09/07/2016 1021   PLT 369 12/28/2014 0625   MCV 82 09/07/2016 1021   MCH 26.7 09/07/2016 1021   MCH 21.3 (L) 12/28/2014 0625   MCHC 32.6 09/07/2016 1021   MCHC 30.4 12/28/2014 0625   RDW 14.8 09/07/2016 1021   LYMPHSABS 2.9 09/07/2016 1021   MONOABS 0.5 12/26/2014 2310   EOSABS 0.1 09/07/2016 1021   BASOSABS 0.1 09/07/2016 1021   Iron/TIBC/Ferritin/ %Sat    Component Value Date/Time   IRON 27  09/07/2016 1021   TIBC 354 09/07/2016 1021   FERRITIN 29 09/07/2016 1021   IRONPCTSAT 8 (LL) 09/07/2016 1021   Lipid Panel     Component Value Date/Time   CHOL 157 09/07/2016 1021   TRIG 143 09/07/2016 1021   HDL 34 (L) 09/07/2016 1021   LDLCALC 94 09/07/2016 1021   Hepatic Function Panel     Component Value Date/Time   PROT 7.3 09/07/2016 1021   ALBUMIN 4.3 09/07/2016 1021   AST 17  09/07/2016 1021   ALT 18 09/07/2016 1021   ALKPHOS 152 (H) 09/07/2016 1021   BILITOT <0.2 09/07/2016 1021      Component Value Date/Time   TSH 1.740 09/07/2016 1021   TSH 2.921 12/28/2014 0645    ASSESSMENT AND PLAN: Vitamin D deficiency - Plan: Vitamin D, Ergocalciferol, (DRISDOL) 50000 units CAPS capsule  Insulin resistance - Plan: metFORMIN (GLUCOPHAGE) 500 MG tablet  At risk for diabetes mellitus  Class 2 obesity without serious comorbidity with body mass index (BMI) of 36.0 to 36.9 in adult, unspecified obesity type  PLAN:  Vitamin D Deficiency Giovanna was informed that low vitamin D levels contributes to fatigue and are associated with obesity, breast, and colon cancer. She agrees to start to take prescription Vit D @50 ,000 IU every week #4 with no refills and will follow up for routine testing of vitamin D, at least 2-3 times per year. She was informed of the risk of over-replacement of vitamin D and agrees to not increase her dose unless he discusses this with [place] first. Tempestt agrees to follow up with our clinic in 2 weeks.  Insulin Resistance Brailyn will continue to work on weight loss, exercise, and decreasing simple carbohydrates in her diet to help decrease the risk of diabetes. We dicussed metformin including benefits and risks. She was informed that eating too many simple carbohydrates or too many calories at one sitting increases the likelihood of GI side effects. Amalea requested metformin for now and prescription was written today for Metformin 500 mg every morning #30 with no  refills. Adamari agreed to follow up with [place] as directed to monitor her progress.  Diabetes risk counselling Charlett was given extended (at least 30 minutes) diabetes prevention counseling today. She is 26 y.o. female and has risk factors for diabetes including obesity. We discussed intensive lifestyle modifications today with an emphasis on weight loss as well as increasing exercise and decreasing simple carbohydrates in her diet.  Obesity Reesha is currently in the action stage of change. As such, her goal is to continue with weight loss efforts She has agreed to keep a food journal with 350 to 500 calories and 30+ grams of protein daily and follow the Category 2 plan Toshiye has been instructed to work up to a goal of 150 minutes of combined cardio and strengthening exercise per week for weight loss and overall health benefits. We discussed the following Behavioral Modification Stratagies today: increasing lean protein intake, decreasing simple carbohydrates , increasing lower sugar fruits and decrease snacking   Tanysha has agreed to follow up with our clinic in 2 weeks. She was informed of the importance of frequent follow up visits to maximize her success with intensive lifestyle modifications for her multiple health conditions.  I, Doreene Nest, am acting as scribe for Dennard Nip, MD  I have reviewed the above documentation for accuracy and completeness, and I agree with the above. -Dennard Nip, MD

## 2016-10-03 ENCOUNTER — Encounter (INDEPENDENT_AMBULATORY_CARE_PROVIDER_SITE_OTHER): Payer: Self-pay | Admitting: Family Medicine

## 2016-10-04 ENCOUNTER — Encounter (INDEPENDENT_AMBULATORY_CARE_PROVIDER_SITE_OTHER): Payer: Self-pay

## 2016-10-05 ENCOUNTER — Ambulatory Visit (INDEPENDENT_AMBULATORY_CARE_PROVIDER_SITE_OTHER): Payer: BLUE CROSS/BLUE SHIELD | Admitting: Family Medicine

## 2016-10-10 ENCOUNTER — Encounter (HOSPITAL_COMMUNITY): Payer: Self-pay | Admitting: Psychiatry

## 2016-10-10 ENCOUNTER — Ambulatory Visit (INDEPENDENT_AMBULATORY_CARE_PROVIDER_SITE_OTHER): Payer: BLUE CROSS/BLUE SHIELD | Admitting: Psychiatry

## 2016-10-10 DIAGNOSIS — F419 Anxiety disorder, unspecified: Secondary | ICD-10-CM

## 2016-10-10 DIAGNOSIS — F428 Other obsessive-compulsive disorder: Secondary | ICD-10-CM | POA: Diagnosis not present

## 2016-10-10 DIAGNOSIS — Z818 Family history of other mental and behavioral disorders: Secondary | ICD-10-CM | POA: Diagnosis not present

## 2016-10-10 DIAGNOSIS — Z79899 Other long term (current) drug therapy: Secondary | ICD-10-CM

## 2016-10-10 DIAGNOSIS — Z87891 Personal history of nicotine dependence: Secondary | ICD-10-CM | POA: Diagnosis not present

## 2016-10-10 MED ORDER — OLANZAPINE-FLUOXETINE HCL 3-25 MG PO CAPS: 1.0000 | ORAL_CAPSULE | Freq: Every day | ORAL | 2 refills | Status: DC

## 2016-10-10 NOTE — Progress Notes (Signed)
BH MD/PA/NP OP Progress Note  10/10/2016 8:36 AM Diana Page  MRN:  [id number]  Chief Complaint:  Subjective:  I'm taking medication.  HPI: Patient came for her follow-up appointment.  She is taking her medication as prescribed.  Recently she's seen physician at [facility] to lose weight.  However she was disappointed because she was given metformin which she could not tolerate and a stop after 2 days.  Now she is seeing a different provider Sharee Pimple carry at [facility] who had more expertise to dealing with IBS.  Overall she described her OCD is little bit more intense because she was promoted at her job and now she has more responsibilities.  She works at [employer].  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  She has noticed taking extra time to do these things but denies any major panic attack.  She denies any suicidal thoughts.  She sleeping good.  She reported her good relationship with her fianc and currently she is living with his parents.  Patient denies any paranoia or any hallucination.  She really enjoyed her work.  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  She feels proud that she has not taken Xanax in past 6 months.  Her appetite is okay.  She continues to gain weight but hoping with the program and new provider she may lose in the future.  She denies any tremors, shakes or any EPS.  Her headaches are also less frequent and less intense.  Her energy level is good.  Patient denies drinking alcohol or using any illegal substances.  Recently she started taking birth control and vitamin D.  Visit Diagnosis:    ICD-9-CM ICD-10-CM   1. Other obsessive-compulsive disorders 300.3 F42.8 OLANZapine-FLUoxetine (SYMBYAX) 3-25 MG capsule    Past Psychiatric History: Reviewed. Patient has history of anxiety most of her life.  She was diagnosed with OCD and in the past given Prozac when she was in [place].  However it did not helped  and she was given Symbyax and she had a good response. Patient denies any hallucination, paranoia, mood swing, agitation or any anger.  She denies any history of suicidal attempt or any inpatient psychiatric treatment.  Past Medical History:  Past Medical History:  Diagnosis Date  . Anemia   . Anxiety   . Back pain   . Depression   . Fatty liver   . Gallbladder problem   . GERD (gastroesophageal reflux disease)   . Hypertension    Intercranial Hypertension  . IBS (irritable bowel syndrome)   . Joint pain   . Obsessive compulsive disorder 06/14  . Ostomy nurse consultation   . Pancreatitis   . Pancreatitis    hx of   . SOB (shortness of breath)   . UC (ulcerative colitis) [facility])     Past Surgical History:  Procedure Laterality Date  . CHOLECYSTECTOMY    . LAPAROSCOPIC PARTIAL PROTECTOMY    . PERMANENT ILEOSTOMY    . TOTAL COLECTOMY N/A     Family Psychiatric History: Reviewed.  Family History:  Family History  Problem Relation Age of Onset  . OCD Mother   . Diabetes Mother   . Depression Mother   . Anxiety disorder Mother   . Obesity Mother   . Obesity Father   . OCD Sister   . Depression Sister   . OCD Brother   . Anxiety disorder Brother  Social History:  Social History   Social History  . Marital status: Single    Spouse name: N/A  . Number of children: 0  . Years of education: Ba   Occupational History  . Supervisor      Chief Operating Officer   Social History Main Topics  . Smoking status: Former Smoker    Types: Cigarettes, E-cigarettes    Quit date: 06/27/2009  . Smokeless tobacco: Never Used  . Alcohol use No  . Drug use: No  . Sexual activity: Not Currently    Partners: Male    Birth control/ protection: Condom   Other Topics Concern  . None   Social History Narrative   Lives at home with boyfriend, Lennette Bihari.   Caffeine use: Tea occass    Allergies:  Allergies  Allergen Reactions  . Nsaids Other (See Comments)    Because of her UC     Metabolic Disorder Labs: Recent Results (from the past 2160 hour(s))  Anemia panel     Status: Abnormal   Collection Time: 09/07/16 10:21 AM  Result Value Ref Range   Total Iron Binding Capacity 354 250 - 450 ug/dL   UIBC 327 131 - 425 ug/dL   Iron 27 27 - 159 ug/dL   Iron Saturation 8 (LL) 15 - 55 %   Vitamin B-12 568 232 - 1,245 pg/mL   Folate, Hemolysate 429.7 Not Estab. ng/mL   Hematocrit 38.3 34.0 - 46.6 %   Folate, RBC 1,122 >498 ng/mL   Ferritin 29 15 - 150 ng/mL   Retic Ct Pct 1.5 0.6 - 2.6 %  CBC With Differential     Status: None   Collection Time: 09/07/16 10:21 AM  Result Value Ref Range   WBC 8.8 3.4 - 10.8 x10E3/uL   RBC 4.69 3.77 - 5.28 x10E6/uL   Hemoglobin 12.5 11.1 - 15.9 g/dL   MCV 82 79 - 97 fL   MCH 26.7 26.6 - 33.0 pg   MCHC 32.6 31.5 - 35.7 g/dL   RDW 14.8 12.3 - 15.4 %   Neutrophils 58 Not Estab. %   Lymphs 33 Not Estab. %   Monocytes 5 Not Estab. %   Eos 2 Not Estab. %   Basos 1 Not Estab. %   Neutrophils Absolute 5.3 1.4 - 7.0 x10E3/uL   Lymphocytes Absolute 2.9 0.7 - 3.1 x10E3/uL   Monocytes Absolute 0.5 0.1 - 0.9 x10E3/uL   EOS (ABSOLUTE) 0.1 0.0 - 0.4 x10E3/uL   Basophils Absolute 0.1 0.0 - 0.2 x10E3/uL   Immature Granulocytes 1 Not Estab. %   Immature Grans (Abs) 0.1 0.0 - 0.1 x10E3/uL  Comprehensive metabolic panel     Status: Abnormal   Collection Time: 09/07/16 10:21 AM  Result Value Ref Range   Glucose 82 65 - 99 mg/dL   BUN 11 6 - 20 mg/dL   Creatinine, Ser 0.77 0.57 - 1.00 mg/dL   GFR calc non Af Amer 106 >59 mL/min/1.73   GFR calc Af Amer 122 >59 mL/min/1.73   BUN/Creatinine Ratio 14 9 - 23   Sodium 140 134 - 144 mmol/L   Potassium 4.0 3.5 - 5.2 mmol/L   Chloride 106 96 - 106 mmol/L   CO2 18 18 - 29 mmol/L   Calcium 8.8 8.7 - 10.2 mg/dL   Total Protein 7.3 6.0 - 8.5 g/dL   Albumin 4.3 3.5 - 5.5 g/dL   Globulin, Total 3.0 1.5 - 4.5 g/dL   Albumin/Globulin Ratio 1.4 1.2 - 2.2  Bilirubin Total <0.2 0.0 - 1.2 mg/dL    Alkaline Phosphatase 152 (H) 39 - 117 IU/L   AST 17 0 - 40 IU/L   ALT 18 0 - 32 IU/L  Folate     Status: None   Collection Time: 09/07/16 10:21 AM  Result Value Ref Range   Folate 14.0 >3.0 ng/mL    Comment: A serum folate concentration of less than 3.1 ng/mL is considered to represent clinical deficiency.   Hemoglobin A1c     Status: None   Collection Time: 09/07/16 10:21 AM  Result Value Ref Range   Hgb A1c MFr Bld 5.1 4.8 - 5.6 %    Comment:          Pre-diabetes: 5.7 - 6.4          Diabetes: >6.4          Glycemic control for adults with diabetes: <7.0    Est. average glucose Bld gHb Est-mCnc 100 mg/dL  Insulin, random     Status: None   Collection Time: 09/07/16 10:21 AM  Result Value Ref Range   INSULIN 19.5 2.6 - 24.9 uIU/mL  Lipid Panel With LDL/HDL Ratio     Status: Abnormal   Collection Time: 09/07/16 10:21 AM  Result Value Ref Range   Cholesterol, Total 157 100 - 199 mg/dL   Triglycerides 143 0 - 149 mg/dL   HDL 34 (L) >39 mg/dL   VLDL Cholesterol Cal 29 5 - 40 mg/dL   LDL Calculated 94 0 - 99 mg/dL   LDl/HDL Ratio 2.8 0.0 - 3.2 ratio units    Comment:                                     LDL/HDL Ratio                                             Men  Women                               1/2 Avg.Risk  1.0    1.5                                   Avg.Risk  3.6    3.2                                2X Avg.Risk  6.2    5.0                                3X Avg.Risk  8.0    6.1   T3     Status: None   Collection Time: 09/07/16 10:21 AM  Result Value Ref Range   T3, Total 131 71 - 180 ng/dL  T4, free     Status: None   Collection Time: 09/07/16 10:21 AM  Result Value Ref Range   Free T4 1.03 0.82 - 1.77 ng/dL  TSH     Status: None   Collection Time: 09/07/16 10:21 AM  Result Value Ref Range  TSH 1.740 0.450 - 4.500 uIU/mL  VITAMIN D 25 Hydroxy (Vit-D Deficiency, Fractures)     Status: Abnormal   Collection Time: 09/07/16 10:21 AM  Result Value Ref Range   Vit  D, 25-Hydroxy 9.8 (L) 30.0 - 100.0 ng/mL    Comment: Vitamin D deficiency has been defined by the [place] practice guideline as a level of serum 25-OH vitamin D less than 20 ng/mL (1,2). The Endocrine Society went on to further define vitamin D insufficiency as a level between 21 and 29 ng/mL (2). 1. IOM (Institute of Medicine). 2010. Dietary reference    intakes for calcium and D. [place]: The    [employer]. 2. Holick MF, [place], Bischoff-Ferrari HA, et al.    Evaluation, treatment, and prevention of vitamin D    deficiency: an Endocrine Society clinical practice    guideline. JCEM. 2011 Jul; 96(7):1911-30.   Magnesium     Status: None   Collection Time: 09/07/16 10:21 AM  Result Value Ref Range   Magnesium 2.2 1.6 - 2.3 mg/dL   Lab Results  Component Value Date   HGBA1C 5.1 09/07/2016   No results found for: PROLACTIN Lab Results  Component Value Date   CHOL 157 09/07/2016   TRIG 143 09/07/2016   HDL 34 (L) 09/07/2016   LDLCALC 94 09/07/2016     Current Medications: Current Outpatient Prescriptions  Medication Sig Dispense Refill  . acetaZOLAMIDE (DIAMOX) 500 MG capsule Take 1 capsule (500 mg total) by mouth 3 (three) times daily. 90 capsule 11  . ALPRAZolam (XANAX) 0.5 MG tablet Take 1 tablet (0.5 mg total) by mouth daily as needed for anxiety. 15 tablet 0  . OLANZapine-FLUoxetine (SYMBYAX) 3-25 MG capsule Take 1 capsule by mouth at bedtime. 30 capsule 2  . SPRINTEC 28 0.25-35 MG-MCG tablet Take 1 tablet by mouth daily.  4  . Vitamin D, Ergocalciferol, (DRISDOL) 50000 units CAPS capsule Take 1 capsule (50,000 Units total) by mouth every 7 (seven) days. 4 capsule 0   No current facility-administered medications for this visit.     Neurologic: Headache: No Seizure: No Paresthesias: No  Musculoskeletal: Strength & Muscle Tone: within normal limits Gait & Station: normal Patient leans: N/A  Psychiatric  Specialty Exam: Review of Systems  Constitutional: Negative.  Negative for weight loss.  HENT: Negative.   Musculoskeletal: Negative.   Skin: Negative.  Negative for itching and rash.  Neurological: Negative for dizziness and headaches.  Psychiatric/Behavioral: The patient is nervous/anxious.     Blood pressure 122/74, pulse 78, height 5' 2"  (1.575 m), weight 206 lb 9.6 oz (93.7 kg).Body mass index is 37.79 kg/m.  General Appearance: Casual  Eye Contact:  Good  Speech:  Clear and Coherent  Volume:  Normal  Mood:  Anxious  Affect:  Congruent  Thought Process:  Goal Directed  Orientation:  Full (Time, Place, and Person)  Thought Content: Logical and Rumination   Suicidal Thoughts:  No  Homicidal Thoughts:  No  Memory:  Immediate;   Good Recent;   Good Remote;   Good  Judgement:  Good  Insight:  Good  Psychomotor Activity:  Normal  Concentration:  Concentration: Good and Attention Span: Good  Recall:  Good  Fund of Knowledge: Good  Language: Good  Akathisia:  No  Handed:  Right  AIMS (if indicated):  0  Assets:  Communication Skills Desire for Improvement Housing Resilience  ADL's:  Intact  Cognition: WNL  Sleep:  good   Assessment: Obsessive-compulsive disorder.  Anxiety disorder NOS.  Plan: Reassurance given.  Discuss medication side effects and benefits.  Patient reluctant to increase the dose due to trying to lose weight.  She like to continue on her current Symbyax.  She has no tremors shakes or any EPS.  I reviewed blood work she has low vitamin D, her hemoglobin sees 5.1 and lipid panel was also reviewed.  Her alkaline phosphatase 152 but her AST potassium BUN and creatinine is normal.  Patient is hoping to lose weight when she will start a different provider [facility].  I recommended to call us back if she has any question or any concern.  Discuss safety plan that anytime having active suicidal thoughts or homicidal thoughts and didn't call 911 or go to the  local emergency room.  Follow-up in 3 months.  ARFEEN,SYED T., MD 10/10/2016, 8:36 AM

## 2016-10-20 ENCOUNTER — Other Ambulatory Visit (HOSPITAL_COMMUNITY): Payer: Self-pay | Admitting: Psychiatry

## 2016-10-20 DIAGNOSIS — F428 Other obsessive-compulsive disorder: Secondary | ICD-10-CM

## 2016-10-25 ENCOUNTER — Encounter (INDEPENDENT_AMBULATORY_CARE_PROVIDER_SITE_OTHER): Payer: Self-pay

## 2016-10-25 ENCOUNTER — Telehealth (INDEPENDENT_AMBULATORY_CARE_PROVIDER_SITE_OTHER): Payer: Self-pay | Admitting: Family Medicine

## 2016-10-25 NOTE — Telephone Encounter (Signed)
[facility] [place]: phone [phone] called about Metformin refill request  May speak with pharmacist

## 2017-01-04 ENCOUNTER — Other Ambulatory Visit (HOSPITAL_COMMUNITY): Payer: Self-pay | Admitting: Psychiatry

## 2017-01-04 DIAGNOSIS — F428 Other obsessive-compulsive disorder: Secondary | ICD-10-CM

## 2017-01-10 ENCOUNTER — Ambulatory Visit (INDEPENDENT_AMBULATORY_CARE_PROVIDER_SITE_OTHER): Payer: BLUE CROSS/BLUE SHIELD | Admitting: Psychiatry

## 2017-01-10 ENCOUNTER — Encounter (HOSPITAL_COMMUNITY): Payer: Self-pay | Admitting: Psychiatry

## 2017-01-10 DIAGNOSIS — F429 Obsessive-compulsive disorder, unspecified: Secondary | ICD-10-CM

## 2017-01-10 DIAGNOSIS — Z87891 Personal history of nicotine dependence: Secondary | ICD-10-CM

## 2017-01-10 DIAGNOSIS — F419 Anxiety disorder, unspecified: Secondary | ICD-10-CM | POA: Diagnosis not present

## 2017-01-10 DIAGNOSIS — Z818 Family history of other mental and behavioral disorders: Secondary | ICD-10-CM

## 2017-01-10 DIAGNOSIS — F428 Other obsessive-compulsive disorder: Secondary | ICD-10-CM

## 2017-01-10 MED ORDER — OLANZAPINE-FLUOXETINE HCL 3-25 MG PO CAPS: 1.0000 | ORAL_CAPSULE | Freq: Every day | ORAL | 0 refills | Status: DC

## 2017-01-10 NOTE — Progress Notes (Signed)
BH MD/PA/NP OP Progress Note  01/10/2017 10:22 AM Diana Page  MRN:  [id number]  Chief Complaint:  Subjective:  I am doing good.  My fianc got a better job and I will quit working.  HPI: Patient came for her follow-up appointment.  She is taking Symbyax as prescribed.  Her OCD symptoms are well controlled with the medication.  She still have some time excessive checking and counting but it is stable.  She is happy that her fianc got a better job and she both quit working at [employer].  She believe that would help her stress level and her OCD symptoms may get better.  She denies any major panic attack.  She has not taken Xanax since the last visit.  She will resume school after summer break.  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  Her relationship is going well.  She has chronic symptoms of IBS but now it does not bother her.  Patient did not like [facility] who she saw once .  She is getting therapy from her therapist who lives in [place] and patient does regular session on the phone weekly.  Patient denies any irritability, anger, mania, psychosis or any hallucination.  Patient lives with her parents.  Her appetite is okay.  Her energy level is okay.  Her vital signs are stable.  Patient denies drinking alcohol or using any illegal substances.  Visit Diagnosis:    ICD-10-CM   1. Other obsessive-compulsive disorders F42.8 OLANZapine-FLUoxetine (SYMBYAX) 3-25 MG capsule    Past Psychiatric History: Reviewed. Patient has history of anxiety most of her life. She was diagnosed with OCD and in the past given Prozac when she was in [place]. However it did not helped and she was given Symbyax and she had a good response. Patient denies any hallucination, paranoia, mood swing, agitation or any anger. She denies any history of suicidal attempt or any inpatient psychiatric treatment.  Past Medical History:  Past Medical History:  Diagnosis Date  . Anemia   .  Anxiety   . Back pain   . Depression   . Fatty liver   . Gallbladder problem   . GERD (gastroesophageal reflux disease)   . Hypertension    Intercranial Hypertension  . IBS (irritable bowel syndrome)   . Joint pain   . Obsessive compulsive disorder 06/14  . Ostomy nurse consultation   . Pancreatitis   . Pancreatitis    hx of   . SOB (shortness of breath)   . UC (ulcerative colitis) [facility])     Past Surgical History:  Procedure Laterality Date  . CHOLECYSTECTOMY    . LAPAROSCOPIC PARTIAL PROTECTOMY    . PERMANENT ILEOSTOMY    . TOTAL COLECTOMY N/A     Family Psychiatric History: Reviewed.  Family History:  Family History  Problem Relation Age of Onset  . OCD Mother   . Diabetes Mother   . Depression Mother   . Anxiety disorder Mother   . Obesity Mother   . Obesity Father   . OCD Sister   . Depression Sister   . OCD Brother   . Anxiety disorder Brother     Social History:  Social History   Social History  . Marital status: Single    Spouse name: N/A  . Number of children: 0  . Years of education: Ba   Occupational History  . Supervisor      Tax adviser and [employer]  Social History Main Topics  . Smoking status: Former Smoker    Types: Cigarettes, E-cigarettes    Quit date: 06/27/2009  . Smokeless tobacco: Never Used  . Alcohol use No  . Drug use: No  . Sexual activity: Not Currently    Partners: Male    Birth control/ protection: Condom   Other Topics Concern  . None   Social History Narrative   Lives at home with boyfriend, Lennette Bihari.   Caffeine use: Tea occass    Allergies:  Allergies  Allergen Reactions  . Nsaids Other (See Comments)    Because of her UC    Metabolic Disorder Labs: Lab Results  Component Value Date   HGBA1C 5.1 09/07/2016   No results found for: PROLACTIN Lab Results  Component Value Date   CHOL 157 09/07/2016   TRIG 143 09/07/2016   HDL 34 (L) 09/07/2016   LDLCALC 94 09/07/2016     Current Medications: Current  Outpatient Prescriptions  Medication Sig Dispense Refill  . acetaZOLAMIDE (DIAMOX) 500 MG capsule Take 1 capsule (500 mg total) by mouth 3 (three) times daily. 90 capsule 11  . ALPRAZolam (XANAX) 0.5 MG tablet Take 1 tablet (0.5 mg total) by mouth daily as needed for anxiety. 15 tablet 0  . OLANZapine-FLUoxetine (SYMBYAX) 3-25 MG capsule Take 1 capsule by mouth at bedtime. 30 capsule 2  . SPRINTEC 28 0.25-35 MG-MCG tablet Take 1 tablet by mouth daily.  4   No current facility-administered medications for this visit.     Neurologic: Headache: No Seizure: No Paresthesias: No  Musculoskeletal: Strength & Muscle Tone: within normal limits Gait & Station: normal Patient leans: N/A  Psychiatric Specialty Exam: ROS  Blood pressure 126/74, pulse 80, height 5' 2"  (1.575 m), weight 208 lb 12.8 oz (94.7 kg).Body mass index is 38.19 kg/m.  General Appearance: Casual  Eye Contact:  Good  Speech:  Clear and Coherent  Volume:  Normal  Mood:  Euthymic  Affect:  Appropriate  Thought Process:  Goal Directed  Orientation:  Full (Time, Place, and Person)  Thought Content: Logical   Suicidal Thoughts:  No  Homicidal Thoughts:  No  Memory:  Immediate;   Good Recent;   Good Remote;   Good  Judgement:  Good  Insight:  Good  Psychomotor Activity:  Normal  Concentration:  Concentration: Good and Attention Span: Good  Recall:  Good  Fund of Knowledge: Good  Language: Good  Akathisia:  No  Handed:  Right  AIMS (if indicated):  0  Assets:  Communication Skills Desire for Improvement Housing Social Support  ADL's:  Intact  Cognition: WNL  Sleep:  Good     Assessment: Obsessive compulsive disorder.  Anxiety disorder NOS.  Plan: Patient is a stable on her current medication.  She is hoping once she quit the job she can focus on full-time studies.  She has no tremors shakes or any EPS.  I will continue Symbyax 3-25 milligrams daily at bedtime.  She does not need a new prescription of  Xanax.  Discussed medication side effects and benefits.  Recommended to call us back if she has any question, concern or if she feels worsening of the symptom.  Patient has a therapist who lives in [place] and she talks to him on the phone on a regular basis.  Follow-up in 3 months.  Athina Fahey T., MD 01/10/2017, 10:22 AM

## 2017-01-23 ENCOUNTER — Ambulatory Visit (INDEPENDENT_AMBULATORY_CARE_PROVIDER_SITE_OTHER): Payer: BLUE CROSS/BLUE SHIELD | Admitting: Neurology

## 2017-01-23 ENCOUNTER — Encounter: Payer: Self-pay | Admitting: Neurology

## 2017-01-23 DIAGNOSIS — G932 Benign intracranial hypertension: Secondary | ICD-10-CM

## 2017-01-23 NOTE — Patient Instructions (Addendum)
Remember to drink plenty of fluid, eat healthy meals and do not skip any meals. Try to eat protein with a every meal and eat a healthy snack such as fruit or nuts in between meals. Try to keep a regular sleep-wake schedule and try to exercise daily, particularly in the form of walking, 20-30 minutes a day, if you can.   As far as your medications are concerned, I would like to suggest: Continue meds  As far as diagnostic testing: Dr. Emeline General  I would like to see you back in 6 - 9 months, sooner if we need to. Please call us with any interim questions, concerns, problems, updates or refill requests.   Our phone number is [phone]. We also have an after hours call service for urgent matters and there is a physician on-call for urgent questions. For any emergencies you know to call 911 or go to the nearest emergency room

## 2017-01-23 NOTE — Progress Notes (Signed)
[facility]    Provider:  [name] Referring Provider: Nolene Ebbs, MD Primary Care Physician:  Nolene Ebbs, MD   CC: Headache and vision loss, dizziness  Interval history 01/23/2017: She has had 2 headaches in the last 6 months but it was in the setting of dehydration.  Her weight is fluctuating. She was 212 recently which is her highest. She saw [name] for her weight and she was placed on metformin and had side effects. She is watching her diet, prefers to try differently. She is going back to school and quitting work, getting married in [place]. No headaches, vision is fine no vision changes, she sees [name] every 4 months, doing well on the diamox, visual fields are stable,  No more edema of the eyes. Discussed Bariatric surgery.    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 She is getting married in November. No dizziness. Rare headaches while on the medication however she is on 1582m diamox a day and having a difficult time losing weight which is critical in IIH. Her headaches are better,  1-2 since being last seen. No vision changes, no dizziness, no hearing changes. She is following weight watchers now and she saw a dietician but still having difficulty with weight loss.  She stopped eating meat and is worried about her B12 and iron. No snoring, no morning headache. She takes 15049macetazolamide. She has missed 3 periods but 2 pregnancy tests are negative. They use condoms.  She is on 150053miamox daily and I can decrease this dose if she can lose 10% of her body weight, we may be able to get her off of medication with even more weight loss. Will refer to the [facility] for weight loss. She is having a difficult time with weight loss and I recommend the [facility] [place] the role of weight in IIH and vision loss.   Interval history 03/21/2016: [age]ear old female with papilledema, dizziness, positional headache, vision and hearing changes. Opening pressure was 34.5, improved on diamox. Abnormal MRI with abnormal signal within the medial temporal lobes bilaterally predominantly involving the hippocampal structures. She was working hard, she was not drinking water and she was lifting a lot of things and she was running all day and she felt lightheaded and dizzy and fatigued. She went and sat down for 5 minutes and she was ok. She was not drinking any water. She has one headache that lasted a whole day and a whole night it was terrible. She tried giving up caffeine. Likely caffeine withdrawal. Restared caffeine and fine. Vision has been fine. No snoring, no morning headaches. She takes 1500m63metazolamide.   Interval history 09/16/2015: She is having worsening blurry vision. This started within the last month. Things are not as crisp. No pressure. She is also hearing low level, wooshing in the ears. Worse when she is laying down and then stands. No headaches, no pressure. No side effects from the diamox. She has not been able to lose weight. Will keep her on the current dose and then if weight loss of 10% will decreasing the diamox. Need to repeat MRi of the brain due to new worsneing vision and hearing issues given previously seen Abnormal signal within the medial temporal lobes bilaterally predominantly involving the hippocampal structures.  MRI 09/26/2015: FINDINGS:  The brain parenchyma shows no significant abnormalities except subtle prominence and thickening of  the right hippocampus and medial temporal lobe which is less prominent compared to the previous scan and is of unclear significance. No other structural lesion, tumor infarcts are noted. Diffusion weighted imaging is negative for acute ischemia. Susceptibility weighted imaging is negative for microhemorrhages. Subarachnoid spaces and ventricular system appear normal. Extraction brain  structures up and normal. Calvarium shows no abnormalities. The pituitary gland and cerebellar tonsils appear normal. Flow voids of the large vessels are intact and circulation appear to be patent. Orbits and paranasal sinuses appear normal. Visualized portions of the cervical spine appears normal. Contrast images do not result in any abnormal areas of enhancement.     IMPRESSION: Slightly abnormal MRI scan of the brain showing subtle prominence of the right medial temporal lobe which is of unclear significance. Overall no significant change compared with previous MRI dated 01/16/2015.  Interval update: Opening pressure of LP was 34.5. 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 the thumping and pressure in her head have improved. The peripheral vision changes are gone, no headaches. She needs follow up with ophthalmology. She needs an ophthalmologist to follow in the area, will refer to [name]. She denies any current side effects from the diamox.   HPI: Diana Page is a 26 y.o. female here as a referral from Dr. Jeanie Cooks for dizziness.  PMHx obesity. She was seen in the ED for acute onset headache and dizziness with decreased peripheral vision. She had an abnormal MRI and was called back to the ED for lumbar puncture which showed negative/normal CSF analysis. Unfortunately the LP did not include an opening pressure. She felt much better after the LP however. Currently if she stands up, she gets a thumping in the head. She gets stars in her right >Left peripheral vision. She gets pressure in the head all over. If she lays on her stomach, the pressure is worse and she can feel her heartbeat in her head. She went to optometry who diagnosed edema of the optic disks. Right eye has blurry  vision which is persistent. She has had severe headaches. The dizziness has resolved after the LP. Symptoms felt much better after LP. Denies seizure-like events, staring, altered awareness. Symptoms are daily. No other associated focal neurologic deficits. No neck pain.   Reviewed notes, labs and imaging from outside physicians, which showed: MRi brain 12/27/2014: Personally reviewed images: 1. Abnormal signal within the medial temporal lobes bilaterally predominantly involving the hippocampal structures. This is concerning for infectious process including herpes encephalitis. Other viral encephalitides can give a similar picture. Limbic encephalitis can look like this, but is typically associated with lung cancer. Alternatively, this could be related to seizure activity. The patient has no history of seizures. 2. Question abnormal signal within the anterior aspect of the right optic nerve, potentially related to same process.   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 Widening of the optic nerve sheath. This can be observed with intracranial hypertension, and is commonly seen with pseudotumor cerebri. 2. The subtle signal changes in the mesial temporal lobes  appear unchanged. Although initially worrisome for an infectious or limbic encephalopathy, the stability and the absence of specific clinical symptoms make this less likely. If clinically indicated, consider follow-up evaluation or follow-up CSF analysis  Labs:   CSF: protein, glucose, diff, cell count normal TSH, B12, B1, HIV wnl CRP, ESR elevated CMP unremarkable CBC w/ anemia   Review of  Systems: Patient complains of symptoms per HPI as well as the following symptoms: No CP, no SOB. Pertinent negatives per HPI. All others negative.  Social History   Social History  . Marital status: Single    Spouse name: N/A  . Number of children: 0  . Years of education: Ba   Occupational History  . Supervisor      Chief Operating Officer   Social History Main Topics  . Smoking status: Former Smoker    Types: Cigarettes, E-cigarettes    Quit date: 06/27/2009  . Smokeless tobacco: Never Used  . Alcohol use No  . Drug use: No  . Sexual activity: Not Currently    Partners: Male    Birth control/ protection: Condom, Pill   Other Topics Concern  . Not on file   Social History Narrative   Lives at home with boyfriend, Lennette Bihari.   Caffeine use: Tea occass    Family History  Problem Relation Age of Onset  . OCD Mother   . Diabetes Mother   . Depression Mother   . Anxiety disorder Mother   . Obesity Mother   . Obesity Father   . OCD Sister   . Depression Sister   . OCD Brother   . Anxiety disorder Brother     Past Medical History:  Diagnosis Date  . Anemia   . Anxiety   . Back pain   . Depression   . Fatty liver   . Gallbladder problem   . GERD (gastroesophageal reflux disease)   . Hypertension    Intercranial Hypertension  . IBS (irritable bowel syndrome)   . Joint pain   . Obsessive compulsive disorder 06/14  . Ostomy nurse consultation   . Pancreatitis   . Pancreatitis    hx of   . SOB (shortness of breath)   . UC (ulcerative colitis) [facility])     Past Surgical History:  Procedure Laterality Date  . CHOLECYSTECTOMY    . LAPAROSCOPIC PARTIAL PROTECTOMY    . PERMANENT ILEOSTOMY    . TOTAL COLECTOMY N/A     Current Outpatient Prescriptions  Medication Sig Dispense Refill  . acetaZOLAMIDE (DIAMOX) 500 MG capsule Take 1 capsule (500 mg total) by mouth 3 (three) times daily. 90 capsule 11  . ALPRAZolam (XANAX) 0.5 MG tablet Take 1 tablet (0.5 mg total) by mouth  daily as needed for anxiety. 15 tablet 0  . OLANZapine-FLUoxetine (SYMBYAX) 3-25 MG capsule Take 1 capsule by mouth at bedtime. 90 capsule 0  . SPRINTEC 28 0.25-35 MG-MCG tablet Take 1 tablet by mouth daily.  4   No current facility-administered medications for this visit.     Allergies as of 01/23/2017 - Review Complete 01/23/2017  Allergen Reaction Noted  . Nsaids Other (See Comments) 07/17/2014    Vitals: BP 123/84   Pulse 78   Ht _0  (1.575 m)   Wt 206 lb 12.8 oz (93.8 kg)   BMI 37.82 kg/m  Last Weight:  Wt Readings from Last 1 Encounters:  01/23/17 206 lb 12.8 oz (93.8 kg)   Last Height:   Ht Readings from Last 1  Encounters:  01/23/17 _0  (1.575 m)   Physical exam: Exam: Gen: NAD, conversant, well nourised, obese, well groomed                     Eyes: Conjunctivae clear without exudates or hemorrhage  Neuro: Detailed Neurologic Exam  Speech:    Speech is normal; fluent and spontaneous with normal comprehension.  Cognition:    The patient is oriented to person, place, and time;   Cranial Nerves:    The pupils are equal, round, and reactive to light. The fundi are normal and spontaneous venous pulsations are present. Visual fields are full to finger confrontation. Extraocular movements are intact. Trigeminal sensation is intact and the muscles of mastication are normal. The face is symmetric. The palate elevates in the midline. Hearing intact. Voice is normal. Shoulder shrug is normal. The tongue has normal motion without fasciculations.   Motor Observation:    No asymmetry, no atrophy, and no involuntary movements noted. Tone:    Normal muscle tone.    Posture:    Posture is normal. normal erect    Strength:    Strength is V/V in the upper and lower limbs.       Assessment/Plan: 26year old female with papilledema, dizziness, positional headache, vision and hearing changes, obesity. Opening pressure was 34.5, improved on diamox but having difficulty  with weight loss. Abnormal MRI with abnormal signal within the medial temporal lobes bilaterally predominantly involving the hippocampal structures. 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 Repeat MRi showed subtle signal changes in the mesial temporal lobes unchanged, although initially worrisome for an infectious or limbic encephalopathy, the stability and the absence of specific clinical symptoms make this less likely.  Discussed weight loss as is critical and discussed weight in the pathophysiology of IIH; refer to Bariatric surgery, discussed sleeze in detail and pther options Continue diamox. Discussed teratogenicity, she is getting married next year, advised weight loss before children, although diamox is generally considered compatible with pregnancy no medication is entirely safe. Janeal Holmes advised we can decrease the medication if she loses 10% of body weight and we may be able to entirely stop the diamox with a healthy weight. Will refer to [facility]. Should proceed to ED if vision significantly worsens Follow up with [name] was very positive, she sees him regularly  Discussed: To prevent or relieve headaches, try the following: Cool Compress. Lie down and place a cool compress on your head.  Avoid headache triggers. If certain foods or odors seem to have triggered your migraines in the past, avoid them. A headache diary might help you identify triggers.  Include physical activity in your daily routine. Try a daily walk or other moderate aerobic exercise.  Manage stress. Find healthy ways to cope with the stressors, such as delegating tasks on your to-do list.   Practice relaxation techniques. Try deep breathing, yoga, massage and visualization.  Eat regularly. Eating regularly scheduled meals and maintaining a healthy diet might help prevent headaches. Also, drink plenty of fluids.  Follow a regular sleep schedule. Sleep deprivation might contribute to headaches Consider biofeedback. With this mind-body technique, you learn to control certain bodily functions - such as muscle tension, heart rate  and blood pressure - to prevent headaches or reduce headache pain.    Proceed to emergency room if you experience new or worsening symptoms or symptoms do not resolve, if you have new neurologic symptoms or if headache is severe, or for any concerning symptom.   Provided education and documentation from American headache Society toolbox including articles on: chronic migraine medication overuse headache, chronic migraines, prevention of migraines, behavioral and other nonpharmacologic treatments for headache.    Sarina Ill, MD  [facility] [address] [place], [place] [address]  Phone [phone] Fax [phone]  A total of 25 minutes was spent face-to-face with this patient. Over half this time was spent on counseling patient on the IIH and obesity diagnosis and different diagnostic and therapeutic options available.

## 2017-03-23 ENCOUNTER — Other Ambulatory Visit: Payer: Self-pay | Admitting: Neurology

## 2017-04-06 ENCOUNTER — Other Ambulatory Visit (HOSPITAL_COMMUNITY): Payer: Self-pay | Admitting: Psychiatry

## 2017-04-06 DIAGNOSIS — F428 Other obsessive-compulsive disorder: Secondary | ICD-10-CM

## 2017-04-13 ENCOUNTER — Encounter (HOSPITAL_COMMUNITY): Payer: Self-pay | Admitting: Psychiatry

## 2017-04-13 ENCOUNTER — Ambulatory Visit (INDEPENDENT_AMBULATORY_CARE_PROVIDER_SITE_OTHER): Payer: BLUE CROSS/BLUE SHIELD | Admitting: Psychiatry

## 2017-04-13 DIAGNOSIS — Z818 Family history of other mental and behavioral disorders: Secondary | ICD-10-CM | POA: Diagnosis not present

## 2017-04-13 DIAGNOSIS — F429 Obsessive-compulsive disorder, unspecified: Secondary | ICD-10-CM

## 2017-04-13 DIAGNOSIS — F419 Anxiety disorder, unspecified: Secondary | ICD-10-CM

## 2017-04-13 DIAGNOSIS — Z6379 Other stressful life events affecting family and household: Secondary | ICD-10-CM | POA: Diagnosis not present

## 2017-04-13 DIAGNOSIS — Z87891 Personal history of nicotine dependence: Secondary | ICD-10-CM | POA: Diagnosis not present

## 2017-04-13 DIAGNOSIS — F428 Other obsessive-compulsive disorder: Secondary | ICD-10-CM

## 2017-04-13 MED ORDER — ALPRAZOLAM 0.5 MG PO TABS: 0.5000 mg | ORAL_TABLET | Freq: Every day | ORAL | 0 refills | Status: DC | PRN

## 2017-04-13 MED ORDER — OLANZAPINE-FLUOXETINE HCL 3-25 MG PO CAPS: 1.0000 | ORAL_CAPSULE | Freq: Every day | ORAL | 0 refills | Status: DC

## 2017-04-13 NOTE — Progress Notes (Signed)
[facility] MD/PA/NP OP Progress Note  04/13/2017 10:11 AM Diana Page  MRN:  [id number]  Chief Complaint:  I'm anxious.  I'm getting married in 3 weeks.  HPI: Patient came for her follow-up appointment.  She is compliant with the medication.  Lately she's been stressing out because upcoming marriage.  She is getting married in 3 weeks in [place].  She is trying to arrange everything on the phone and she is very nervous about it.  She admitted took a few times Xanax to calm her down.  However overall she feels medicine working very well.  In the past she has difficulty sleeping but now her sleep is improved.  She is also happy and excited because they bought a house and hoping to move been December 1.  Patient denies any irritability, anger, mania, psychosis.  Her OCD symptoms are under control.  She started her therapist weekly who lives in [place]. She is taking Diamox and her headaches are stable.  She denies any feeling of hopelessness, worthlessness or any suicidal thoughts.  She has no tremors shakes or any EPS.  She wants to continue Symbyax.  Patient denies drinking alcohol or using any illegal substances.  Visit Diagnosis:    ICD-10-CM   1. Other obsessive-compulsive disorders F42.8 OLANZapine-FLUoxetine (SYMBYAX) 3-25 MG capsule  2. Obsessive-compulsive disorder, unspecified type F42.9 ALPRAZolam (XANAX) 0.5 MG tablet    Past Psychiatric History: Reviewed. Patient has history of anxiety most of her life. She was diagnosed with OCD and in the past given Prozac when she was in [place]. However it did not helped and she was given Symbyax and she had a good response. Patient denies any hallucination, paranoia, mood swing, agitation or any anger. She denies any history of suicidal attempt or any inpatient psychiatric treatment.  Past Medical History:  Past Medical History:  Diagnosis Date  . Anemia   . Anxiety   . Back pain   . Depression   . Fatty liver   . Gallbladder problem    . GERD (gastroesophageal reflux disease)   . Hypertension    Intercranial Hypertension  . IBS (irritable bowel syndrome)   . Joint pain   . Obsessive compulsive disorder 06/14  . Ostomy nurse consultation   . Pancreatitis   . Pancreatitis    hx of   . SOB (shortness of breath)   . UC (ulcerative colitis) [facility])     Past Surgical History:  Procedure Laterality Date  . CHOLECYSTECTOMY    . LAPAROSCOPIC PARTIAL PROTECTOMY    . PERMANENT ILEOSTOMY    . TOTAL COLECTOMY N/A     Family Psychiatric History: Reviewed.  Family History:  Family History  Problem Relation Age of Onset  . OCD Mother   . Diabetes Mother   . Depression Mother   . Anxiety disorder Mother   . Obesity Mother   . Obesity Father   . OCD Sister   . Depression Sister   . OCD Brother   . Anxiety disorder Brother     Social History:  Social History   Social History  . Marital status: Single    Spouse name: N/A  . Number of children: 0  . Years of education: Ba   Occupational History  . Supervisor      Chief Operating Officer   Social History Main Topics  . Smoking status: Former Smoker    Types: Cigarettes, E-cigarettes    Quit date: 06/27/2009  . Smokeless tobacco: Never Used  .  Alcohol use No  . Drug use: No  . Sexual activity: Not Currently    Partners: Male    Birth control/ protection: Condom, Pill   Other Topics Concern  . Not on file   Social History Narrative   Lives at home with boyfriend, Lennette Bihari.   Caffeine use: Tea occass    Allergies:  Allergies  Allergen Reactions  . Nsaids Other (See Comments)    Because of her UC    Metabolic Disorder Labs: Lab Results  Component Value Date   HGBA1C 5.1 09/07/2016   No results found for: PROLACTIN Lab Results  Component Value Date   CHOL 157 09/07/2016   TRIG 143 09/07/2016   HDL 34 (L) 09/07/2016   LDLCALC 94 09/07/2016   Lab Results  Component Value Date   TSH 1.740 09/07/2016   TSH 2.921 12/28/2014    Therapeutic Level  Labs: No results found for: LITHIUM No results found for: VALPROATE No components found for:  CBMZ  Current Medications: Current Outpatient Prescriptions  Medication Sig Dispense Refill  . acetaZOLAMIDE (DIAMOX) 500 MG capsule TAKE 1 CAPSULE (500 MG TOTAL) BY MOUTH 3 (THREE) TIMES DAILY. 90 capsule 6  . ALPRAZolam (XANAX) 0.5 MG tablet Take 1 tablet (0.5 mg total) by mouth daily as needed for anxiety. 15 tablet 0  . OLANZapine-FLUoxetine (SYMBYAX) 3-25 MG capsule Take 1 capsule by mouth at bedtime. 90 capsule 0  . SPRINTEC 28 0.25-35 MG-MCG tablet Take 1 tablet by mouth daily.  4   No current facility-administered medications for this visit.      Musculoskeletal: Strength & Muscle Tone: within normal limits Gait & Station: normal Patient leans: N/A  Psychiatric Specialty Exam: ROS  Blood pressure 130/80, pulse 88, height 5' 2"  (1.575 m), weight 215 lb 6.4 oz (97.7 kg).Body mass index is 39.4 kg/m.  General Appearance: Casual  Eye Contact:  Good  Speech:  Clear and Coherent  Volume:  Normal  Mood:  Anxious  Affect:  Appropriate  Thought Process:  Goal Directed  Orientation:  Full (Time, Place, and Person)  Thought Content: Rumination   Suicidal Thoughts:  No  Homicidal Thoughts:  No  Memory:  Immediate;   Good Recent;   Good Remote;   Good  Judgement:  Good  Insight:  Good  Psychomotor Activity:  Normal  Concentration:  Concentration: Good and Attention Span: Good  Recall:  Good  Fund of Knowledge: Good  Language: Good  Akathisia:  No  Handed:  Right  AIMS (if indicated): not done  Assets:  Communication Skills Desire for Improvement Housing Resilience Social Support  ADL's:  Intact  Cognition: WNL  Sleep:  Good   Screenings: PHQ2-9     Office Visit from 09/07/2016 in [facility]  PHQ-2 Total Score  4  1  PHQ-9 Total Score  15  -       Assessment and Plan:  Obsessive-compulsive disorder.  Anxiety disorder NOS.  Reassurance given.  Discussed situational anxiety as patient is getting married in 3 weeks.  Recommended to continue Symbyax 3-25 mg at bedtime.  I will provide a new prescription Xanax 15 tablet to take it as needed for anxiety.  Combinations given.  Recommended to call us back if she has any question, concern or if she feels worsening of the symptom.  Patient continues to talk to her therapist who lives in [place].  Encouraged to continue counseling.  Follow-up in  3 months.   Lubertha Leite T., MD 04/13/2017, 10:11 AM

## 2017-07-10 ENCOUNTER — Encounter (HOSPITAL_COMMUNITY): Payer: Self-pay | Admitting: Psychiatry

## 2017-07-10 ENCOUNTER — Ambulatory Visit (HOSPITAL_COMMUNITY): Payer: No Typology Code available for payment source | Admitting: Psychiatry

## 2017-07-10 DIAGNOSIS — Z811 Family history of alcohol abuse and dependence: Secondary | ICD-10-CM | POA: Diagnosis not present

## 2017-07-10 DIAGNOSIS — F419 Anxiety disorder, unspecified: Secondary | ICD-10-CM

## 2017-07-10 DIAGNOSIS — F429 Obsessive-compulsive disorder, unspecified: Secondary | ICD-10-CM

## 2017-07-10 DIAGNOSIS — Z87891 Personal history of nicotine dependence: Secondary | ICD-10-CM

## 2017-07-10 DIAGNOSIS — Z818 Family history of other mental and behavioral disorders: Secondary | ICD-10-CM | POA: Diagnosis not present

## 2017-07-10 DIAGNOSIS — F428 Other obsessive-compulsive disorder: Secondary | ICD-10-CM

## 2017-07-10 MED ORDER — OLANZAPINE-FLUOXETINE HCL 3-25 MG PO CAPS: 1.0000 | ORAL_CAPSULE | Freq: Every day | ORAL | 0 refills | Status: DC

## 2017-07-10 NOTE — Progress Notes (Signed)
BH MD/PA/NP OP Progress Note  07/10/2017 8:17 AM Diana Page  MRN:  [id number]  Chief Complaint: I got married in November.  Things are going okay.  HPI: Patient came for her follow-up appointment.  She got married in November and moved into the new house.  She is taking her medication and denies any side effects.  Her OCD symptoms are under control but now she is looking for a job and sometimes she gets stressed out.  She also developed severe diarrhea and vomiting when she was visiting [place] and she was given medication.  She also complaining of abdominal pain and now she is taking Percocet as needed.  She is sleeping good.  She denies any irritability, anger, mania or any psychosis.  She denies any panic attack.  She has not use Xanax in more than 6 weeks.  She has no tremors shakes or any EPS.  She denies paranoia or any hallucination.  Her husband is very supportive.  She is hoping to find a new job soon.  She also like to try a therapist in this office as some time long distance therapy on the phone is difficult.  Patient denies drinking alcohol or using any illegal substances.  Her energy level is good.  Visit Diagnosis:    ICD-10-CM   1. Other obsessive-compulsive disorders F42.8 OLANZapine-FLUoxetine (SYMBYAX) 3-25 MG capsule    Past Psychiatric History: Viewed Patient has history of anxiety most of her life. She was diagnosed with OCD and in the past given Prozac when she was in [place]. However it did not helped and she was given Symbyax and she had a good response. Patient denies any hallucination, paranoia, mood swing, agitation or any anger. She denies any history of suicidal attempt or any inpatient psychiatric treatment.  Past Medical History:  Past Medical History:  Diagnosis Date  . Anemia   . Anxiety   . Back pain   . Depression   . Fatty liver   . Gallbladder problem   . GERD (gastroesophageal reflux disease)   . Hypertension    Intercranial Hypertension  .  IBS (irritable bowel syndrome)   . Joint pain   . Obsessive compulsive disorder 06/14  . Ostomy nurse consultation   . Pancreatitis   . Pancreatitis    hx of   . SOB (shortness of breath)   . UC (ulcerative colitis) [facility])     Past Surgical History:  Procedure Laterality Date  . CHOLECYSTECTOMY    . LAPAROSCOPIC PARTIAL PROTECTOMY    . PERMANENT ILEOSTOMY    . TOTAL COLECTOMY N/A     Family Psychiatric History: Reviewed.  Family History:  Family History  Problem Relation Age of Onset  . OCD Mother   . Diabetes Mother   . Depression Mother   . Anxiety disorder Mother   . Obesity Mother   . Obesity Father   . OCD Sister   . Depression Sister   . OCD Brother   . Anxiety disorder Brother     Social History:  Social History   Socioeconomic History  . Marital status: Single    Spouse name: Not on file  . Number of children: 0  . Years of education: Ba  . Highest education level: Not on file  Social Needs  . Financial resource strain: Not on file  . Food insecurity - worry: Not on file  . Food insecurity - inability: Not on file  . Transportation needs - medical: Not on file  .  Transportation needs - non-medical: Not on file  Occupational History  . Occupation: Supervisor     Comment: Chief Operating Officer  Tobacco Use  . Smoking status: Former Smoker    Types: Cigarettes, E-cigarettes    Last attempt to quit: 06/27/2009    Years since quitting: 8.0  . Smokeless tobacco: Never Used  Substance and Sexual Activity  . Alcohol use: No    Alcohol/week: 0.0 oz  . Drug use: No  . Sexual activity: Not Currently    Partners: Male    Birth control/protection: Condom, Pill  Other Topics Concern  . Not on file  Social History Narrative   Lives at home with boyfriend, Lennette Bihari.   Caffeine use: Tea occass    Allergies:  Allergies  Allergen Reactions  . Nsaids Other (See Comments)    Because of her UC    Metabolic Disorder Labs: Lab Results  Component Value Date    HGBA1C 5.1 09/07/2016   No results found for: PROLACTIN Lab Results  Component Value Date   CHOL 157 09/07/2016   TRIG 143 09/07/2016   HDL 34 (L) 09/07/2016   LDLCALC 94 09/07/2016   Lab Results  Component Value Date   TSH 1.740 09/07/2016   TSH 2.921 12/28/2014    Therapeutic Level Labs: No results found for: LITHIUM No results found for: VALPROATE No components found for:  CBMZ  Current Medications: Current Outpatient Medications  Medication Sig Dispense Refill  . acetaZOLAMIDE (DIAMOX) 500 MG capsule TAKE 1 CAPSULE (500 MG TOTAL) BY MOUTH 3 (THREE) TIMES DAILY. 90 capsule 6  . ALPRAZolam (XANAX) 0.5 MG tablet Take 1 tablet (0.5 mg total) by mouth daily as needed for anxiety. 15 tablet 0  . OLANZapine-FLUoxetine (SYMBYAX) 3-25 MG capsule Take 1 capsule by mouth at bedtime. 90 capsule 0  . SPRINTEC 28 0.25-35 MG-MCG tablet Take 1 tablet by mouth daily.  4   No current facility-administered medications for this visit.      Musculoskeletal: Strength & Muscle Tone: within normal limits Gait & Station: normal Patient leans: N/A  Psychiatric Specialty Exam: ROS  Blood pressure 126/74, pulse 72, height 5' 2"  (1.575 m), weight 221 lb (100.2 kg).Body mass index is 40.42 kg/m.  General Appearance: Casual  Eye Contact:  Good  Speech:  Clear and Coherent  Volume:  Normal  Mood:  Anxious  Affect:  Appropriate  Thought Process:  Goal Directed  Orientation:  Full (Time, Place, and Person)  Thought Content: Logical   Suicidal Thoughts:  No  Homicidal Thoughts:  No  Memory:  Immediate;   Good Recent;   Good Remote;   Good  Judgement:  Good  Insight:  Good  Psychomotor Activity:  Normal  Concentration:  Concentration: Good and Attention Span: Good  Recall:  Good  Fund of Knowledge: Good  Language: Good  Akathisia:  No  Handed:  Right  AIMS (if indicated): not done  Assets:  Communication Skills Desire for Improvement Housing Resilience Social Support  ADL's:   Intact  Cognition: WNL  Sleep:  Good   Screenings: PHQ2-9     Office Visit from 09/07/2016 in [facility]  PHQ-2 Total Score  4  1  PHQ-9 Total Score  15  No data       Assessment and Plan: Obsessive-compulsive disorder.  Anxiety disorder NOS.  Patient doing better on her current medication.  Discussed situational anxiety.  She does not need a  new prescription of Xanax.  Continue Symbyax 3-25 mg at bedtime.  Reassurance given as patient is looking for new job.  Because medication side effect especially she started taking Percocet.  Discussed narcotic abuse tolerance and withdrawal.  Recommended to call us back if she has any question, concern or if you feel worsening of the symptoms.  We will schedule appointment to see Janett Billow as patient like to get local therapy appointment.  Follow-up in 3 months.   Kathlee Nations, MD 07/10/2017, 8:17 AM

## 2017-07-26 ENCOUNTER — Ambulatory Visit (HOSPITAL_COMMUNITY): Payer: No Typology Code available for payment source | Admitting: Licensed Clinical Social Worker

## 2017-08-03 ENCOUNTER — Encounter (HOSPITAL_COMMUNITY): Payer: Self-pay | Admitting: Licensed Clinical Social Worker

## 2017-08-03 ENCOUNTER — Ambulatory Visit (INDEPENDENT_AMBULATORY_CARE_PROVIDER_SITE_OTHER): Payer: No Typology Code available for payment source | Admitting: Licensed Clinical Social Worker

## 2017-08-03 DIAGNOSIS — F331 Major depressive disorder, recurrent, moderate: Secondary | ICD-10-CM | POA: Diagnosis not present

## 2017-08-03 DIAGNOSIS — F428 Other obsessive-compulsive disorder: Secondary | ICD-10-CM | POA: Diagnosis not present
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
## 2017-08-28 ENCOUNTER — Encounter (HOSPITAL_COMMUNITY): Payer: Self-pay | Admitting: Licensed Clinical Social Worker

## 2017-08-28 ENCOUNTER — Ambulatory Visit (INDEPENDENT_AMBULATORY_CARE_PROVIDER_SITE_OTHER): Payer: No Typology Code available for payment source | Admitting: Licensed Clinical Social Worker

## 2017-08-28 DIAGNOSIS — F428 Other obsessive-compulsive disorder: Secondary | ICD-10-CM

## 2017-08-28 NOTE — Progress Notes (Signed)
   THERAPIST PROGRESS NOTE  Session Time: 1:10-2pm  Participation Level: Active  Behavioral Response: CasualAlertAnxious  Type of Therapy: Individual Therapy  Treatment Goals addressed: Coping  Interventions: CBT  Summary: Diana Page is a 27 y.o. female who presents for her initial individual counseling session. Spent a considerable amount of time building a trusting therapeutic relationship. Pt discussed her psychiatric symptoms and current life events. Pt moved to [place] from [place] 4 years ago with her husband. She has had considerable gastro medical probs  Which has resulted in an ostomy bag. Pt has dx of other OCD. However she is now exhibiting symptoms of anxiety and depression. 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 Asked pt open ended questions. Pt sees Dr. Adele Schilder but would like to change to another psychiatrist. Instructed pt on the policy of changing providers. Pt is having trouble sleeping, has had some severe depressive days and her anxiety has increased. Suggested pt talk to nurse today about her change in symptoms.and she is afraid to changed meds. Asked pt open ended questions and used empathic reflection. Pt reports her husband is her support system. Explained the purpose process and practice of mindfulness. Suggested to pt to practice mindfulness at home.  Suicidal/Homicidal: Nowithout intent/plan  Therapist Response:  Assessed pt's current functioning and reviewed progress. Assisted pt building a trusting therapeutic relationship. Assisted pt processing for the management of her stressors.  Plan: Return again in 2 weeks.  Diagnosis: Axis 1: Other OCD    Fatema Rabe S, LCAS 08/28/2017

## 2017-09-14 ENCOUNTER — Encounter (HOSPITAL_COMMUNITY): Payer: Self-pay | Admitting: Psychiatry

## 2017-09-14 ENCOUNTER — Ambulatory Visit (INDEPENDENT_AMBULATORY_CARE_PROVIDER_SITE_OTHER): Payer: No Typology Code available for payment source | Admitting: Psychiatry

## 2017-09-14 VITALS — BP 132/80 | HR 85 | Ht 62.0 in | Wt 221.0 lb

## 2017-09-14 DIAGNOSIS — F428 Other obsessive-compulsive disorder: Secondary | ICD-10-CM

## 2017-09-14 DIAGNOSIS — R450 Nervousness: Secondary | ICD-10-CM

## 2017-09-14 DIAGNOSIS — F331 Major depressive disorder, recurrent, moderate: Secondary | ICD-10-CM | POA: Diagnosis not present

## 2017-09-14 DIAGNOSIS — F419 Anxiety disorder, unspecified: Secondary | ICD-10-CM

## 2017-09-14 DIAGNOSIS — Z87891 Personal history of nicotine dependence: Secondary | ICD-10-CM | POA: Diagnosis not present

## 2017-09-14 DIAGNOSIS — Z818 Family history of other mental and behavioral disorders: Secondary | ICD-10-CM | POA: Diagnosis not present

## 2017-09-14 MED ORDER — LORAZEPAM 0.5 MG PO TABS: 0.5000 mg | ORAL_TABLET | Freq: Every day | ORAL | 0 refills | Status: DC | PRN

## 2017-09-14 MED ORDER — OLANZAPINE-FLUOXETINE HCL 6-50 MG PO CAPS: 1.0000 | ORAL_CAPSULE | Freq: Every day | ORAL | 0 refills | Status: DC

## 2017-09-14 NOTE — Progress Notes (Signed)
[place] MD/PA/NP OP Progress Note  09/14/2017 8:41 AM Diana Page  MRN:  [id number]  Chief Complaint: I do not think my medicines working.  I am depressed, irritable and have no energy.  HPI: Patient came for her follow-up appointment.  Patient told her current medicine is not working and she wants to see a different physician as things are not going very well.  She endorsed irritability, lack of energy, fatigue, decreased motivation and having racing thoughts.  She is not working.  She admitted getting frustrated easily.  Though she denies any paranoia, hallucination or any suicidal thoughts but endorsed anxious and nervous.  She is thinking about bariatric surgery and like to start worker after the surgery.  She admitted fewer panic attacks however her OCD symptoms are under control.  She took a few times Xanax but it make her very sleepy.  She denies any rituals.  She admitted feeling times hopeless and helpless.  She is tolerating her medication and denies any tremors shakes or any EPS.  She is scheduled to see neurology at the end of April.  She is seeing Charolotte Eke for therapy.  She endorsed her husband is very supportive.  Patient denies drinking alcohol or using any illegal substances.  She endorsed loss of appetite but her weight and vitals are stable.    Visit Diagnosis:    ICD-10-CM   1. Major depressive disorder, recurrent episode, moderate (HCC) F33.1 OLANZapine-FLUoxetine (SYMBYAX) 6-50 MG capsule    LORazepam (ATIVAN) 0.5 MG tablet  2. Other obsessive-compulsive disorders F42.8 LORazepam (ATIVAN) 0.5 MG tablet    Past Psychiatric History: Reviewed. Patient has history of anxiety most of her life. She was diagnosed with OCD and in the past given Prozac when she was in [place]. However it did not helped and she was given Symbyax and she had a good response. Patient denies any hallucination, paranoia, mood swing, agitation or any anger. She denies any history of suicidal attempt or  any inpatient psychiatric treatment.  Past Medical History:  Past Medical History:  Diagnosis Date  . Anemia   . Anxiety   . Back pain   . Depression   . Fatty liver   . Gallbladder problem   . GERD (gastroesophageal reflux disease)   . Hypertension    Intercranial Hypertension  . IBS (irritable bowel syndrome)   . Joint pain   . Obsessive compulsive disorder 06/14  . Ostomy nurse consultation   . Pancreatitis   . Pancreatitis    hx of   . SOB (shortness of breath)   . UC (ulcerative colitis) [facility])     Past Surgical History:  Procedure Laterality Date  . CHOLECYSTECTOMY    . LAPAROSCOPIC PARTIAL PROTECTOMY    . PERMANENT ILEOSTOMY    . TOTAL COLECTOMY N/A     Family Psychiatric History: Reviewed  Family History:  Family History  Problem Relation Age of Onset  . OCD Mother   . Diabetes Mother   . Depression Mother   . Anxiety disorder Mother   . Obesity Mother   . Obesity Father   . OCD Sister   . Depression Sister   . OCD Brother   . Anxiety disorder Brother     Social History:  Social History   Socioeconomic History  . Marital status: Married    Spouse name: Not on file  . Number of children: 0  . Years of education: Ba  . Highest education level: Not on file  Occupational History  .  Occupation: Librarian, academic     Comment: [employer]  . Financial resource strain: Not on file  . Food insecurity:    Worry: Not on file    Inability: Not on file  . Transportation needs:    Medical: Not on file    Non-medical: Not on file  Tobacco Use  . Smoking status: Former Smoker    Types: Cigarettes, E-cigarettes    Last attempt to quit: 06/27/2009    Years since quitting: 8.2  . Smokeless tobacco: Never Used  Substance and Sexual Activity  . Alcohol use: No    Alcohol/week: 0.0 oz  . Drug use: No  . Sexual activity: Not Currently    Partners: Male    Birth control/protection: Condom, Pill  Lifestyle  . Physical activity:    Days per  week: Not on file    Minutes per session: Not on file  . Stress: Not on file  Relationships  . Social connections:    Talks on phone: Not on file    Gets together: Not on file    Attends religious service: Not on file    Active member of club or organization: Not on file    Attends meetings of clubs or organizations: Not on file    Relationship status: Not on file  Other Topics Concern  . Not on file  Social History Narrative   Lives at home with boyfriend, Lennette Bihari.   Caffeine use: Tea occass    Allergies:  Allergies  Allergen Reactions  . Nsaids Other (See Comments)    Because of her UC    Metabolic Disorder Labs: Lab Results  Component Value Date   HGBA1C 5.1 09/07/2016   No results found for: PROLACTIN Lab Results  Component Value Date   CHOL 157 09/07/2016   TRIG 143 09/07/2016   HDL 34 (L) 09/07/2016   LDLCALC 94 09/07/2016   Lab Results  Component Value Date   TSH 1.740 09/07/2016   TSH 2.921 12/28/2014    Therapeutic Level Labs: No results found for: LITHIUM No results found for: VALPROATE No components found for:  CBMZ  Current Medications: Current Outpatient Medications  Medication Sig Dispense Refill  . acetaZOLAMIDE (DIAMOX) 500 MG capsule TAKE 1 CAPSULE (500 MG TOTAL) BY MOUTH 3 (THREE) TIMES DAILY. 90 capsule 6  . ALPRAZolam (XANAX) 0.5 MG tablet Take 1 tablet (0.5 mg total) by mouth daily as needed for anxiety. 15 tablet 0  . OLANZapine-FLUoxetine (SYMBYAX) 3-25 MG capsule Take 1 capsule by mouth at bedtime. 90 capsule 0  . Ostomy Supplies MISC by Does not apply route.    . SPRINTEC 28 0.25-35 MG-MCG tablet Take 1 tablet by mouth daily.  4  . oxyCODONE-acetaminophen (PERCOCET/ROXICET) 5-325 MG tablet Take 1 tablet by mouth 2 (two) times daily.  0   No current facility-administered medications for this visit.      Musculoskeletal: Strength & Muscle Tone: within normal limits Gait & Station: normal Patient leans: N/A  Psychiatric Specialty  Exam: Review of Systems  Constitutional: Negative.   HENT: Negative.   Respiratory: Negative.   Cardiovascular: Negative.   Genitourinary: Negative.   Musculoskeletal: Negative.   Skin: Negative.   Psychiatric/Behavioral: Positive for depression. The patient is nervous/anxious.     Blood pressure 132/80, pulse 85, height 5' 2"  (1.575 m), weight 221 lb (100.2 kg).Body mass index is 40.42 kg/m.  General Appearance: Casual  Eye Contact:  Fair  Speech:  Clear and Coherent  Volume:  Normal  Mood:  Depressed and Irritable  Affect:  Constricted  Thought Process:  Goal Directed  Orientation:  Full (Time, Place, and Person)  Thought Content: Rumination   Suicidal Thoughts:  No  Homicidal Thoughts:  No  Memory:  Immediate;   Good Recent;   Good Remote;   Good  Judgement:  Good  Insight:  Good  Psychomotor Activity:  Normal  Concentration:  Concentration: Good and Attention Span: Good  Recall:  Good  Fund of Knowledge: Good  Language: Good  Akathisia:  No  Handed:  Right  AIMS (if indicated): not done  Assets:  Communication Skills Desire for Improvement Housing Resilience Social Support  ADL's:  Intact  Cognition: WNL  Sleep:  Fair   Screenings: PHQ2-9     Office Visit from 09/07/2016 in [facility]  PHQ-2 Total Score  4  1  PHQ-9 Total Score  15  -       Assessment and Plan: Obsessive-compulsive disorder.  Anxiety disorder NOS.  Discussed in detail about medication side effects.  Policy about changing the doctors.  After some encouragement she willing to try higher dose of Symbyax.  Patient reluctant to try any new medication.  She also does not like Xanax because it is making her sleepy.  We will try Ativan 0.5 mg for panic attacks.  Courage to keep appointment with a therapist for CBT.  I strongly encouraged her to call us back if she has any question, concern or if she feels  worsening of the symptoms.  We will try Symbyax 6-50 mg daily and lorazepam 0.5 mg per see for anxiety.  Patient read that if increased dose of medication does not help then we will consider trying a different medication.  Follow-up in 3-4 weeks.  Discussed safety concerns at any time having active suicidal thoughts or homicidal thought and she need to call 911 or go to local emergency room.  Time spent 25 minutes.  More than 50% of the time spent in psychoeducation, counseling, coordination of care and long-term prognosis.   Kathlee Nations, MD 09/14/2017, 8:41 AM

## 2017-09-23 ENCOUNTER — Encounter: Payer: Self-pay | Admitting: Neurology

## 2017-09-23 ENCOUNTER — Other Ambulatory Visit: Payer: Self-pay | Admitting: Neurology

## 2017-09-25 ENCOUNTER — Ambulatory Visit (INDEPENDENT_AMBULATORY_CARE_PROVIDER_SITE_OTHER): Payer: No Typology Code available for payment source | Admitting: Licensed Clinical Social Worker

## 2017-09-25 ENCOUNTER — Other Ambulatory Visit: Payer: Self-pay | Admitting: Neurology

## 2017-09-25 ENCOUNTER — Encounter (HOSPITAL_COMMUNITY): Payer: Self-pay | Admitting: Licensed Clinical Social Worker

## 2017-09-25 DIAGNOSIS — F331 Major depressive disorder, recurrent, moderate: Secondary | ICD-10-CM | POA: Diagnosis not present

## 2017-09-25 NOTE — Progress Notes (Signed)
   THERAPIST PROGRESS NOTE  Session Time: 1:10-2pm  Participation Level: Active  Behavioral Response: CasualAlertAnxious  Type of Therapy: Individual Therapy  Treatment Goals addressed: Coping  Interventions: CBT  Summary: Diana Page is a 27 y.o. female who presents for her individual counseling session. Pt discussed her psychiatric symptoms and current life events. Pt met with Dr. Adele Schilder and talked to him about changing physichiatrists. She said they had a good conversation and was able to open up to him, so she has decided to stay with him as her psychiatrist. He changed her meds and she feels less voices ruminating in her head constantly. 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 She has 4 courses to take before she can be admitted to nursing school. She has spoken with a few of her providers as well as family about her decision about bariatric surgery. Pt reports she is concerned her OCD will interfere with nursing school. Pt has an ostomy bag and in her research about bariatric surgery it appears it may help with her gastro issues. Pt reports she has issues with catastraphising. Discussed emotional regulation skills with pt and taught her "check the facts" and sitting with the emotional pain in uncomfortableness. Pt was in agreement to try these new skills. Pt agreed to journal and has found it effective in dealing with some of her pain. Encourage pt to continue to journal.   Suicidal/Homicidal: Nowithout intent/plan  Therapist Response:  Assessed pt's current functioning and reviewed progress. Assisted pt by validating her life changing decisions and opening up to her psychiatrist, emotional regulation skills, journaling. Assisted pt processing for the management of her stressors.  Plan: Return again in 2 weeks.  Diagnosis: Axis 1: MDD, Other OCD    Wanya Bangura S, LCAS 09/25/2017

## 2017-10-09 ENCOUNTER — Ambulatory Visit (HOSPITAL_COMMUNITY): Payer: No Typology Code available for payment source | Admitting: Psychiatry

## 2017-10-10 ENCOUNTER — Ambulatory Visit (HOSPITAL_COMMUNITY): Payer: No Typology Code available for payment source | Admitting: Licensed Clinical Social Worker

## 2017-10-11 ENCOUNTER — Other Ambulatory Visit (HOSPITAL_COMMUNITY): Payer: Self-pay | Admitting: Psychiatry

## 2017-10-11 DIAGNOSIS — F428 Other obsessive-compulsive disorder: Secondary | ICD-10-CM

## 2017-10-13 ENCOUNTER — Encounter (HOSPITAL_COMMUNITY): Payer: Self-pay | Admitting: Psychiatry

## 2017-10-13 ENCOUNTER — Other Ambulatory Visit: Payer: Self-pay

## 2017-10-13 ENCOUNTER — Ambulatory Visit (INDEPENDENT_AMBULATORY_CARE_PROVIDER_SITE_OTHER): Payer: No Typology Code available for payment source | Admitting: Psychiatry

## 2017-10-13 DIAGNOSIS — F411 Generalized anxiety disorder: Secondary | ICD-10-CM | POA: Diagnosis not present

## 2017-10-13 DIAGNOSIS — Z87891 Personal history of nicotine dependence: Secondary | ICD-10-CM

## 2017-10-13 DIAGNOSIS — Z560 Unemployment, unspecified: Secondary | ICD-10-CM

## 2017-10-13 DIAGNOSIS — Z818 Family history of other mental and behavioral disorders: Secondary | ICD-10-CM

## 2017-10-13 DIAGNOSIS — F331 Major depressive disorder, recurrent, moderate: Secondary | ICD-10-CM | POA: Diagnosis not present

## 2017-10-13 DIAGNOSIS — F428 Other obsessive-compulsive disorder: Secondary | ICD-10-CM

## 2017-10-13 MED ORDER — OLANZAPINE-FLUOXETINE HCL 6-50 MG PO CAPS: 1.0000 | ORAL_CAPSULE | Freq: Every day | ORAL | 0 refills | Status: DC

## 2017-10-13 MED ORDER — LORAZEPAM 0.5 MG PO TABS: 0.5000 mg | ORAL_TABLET | Freq: Every day | ORAL | 0 refills | Status: DC | PRN

## 2017-10-13 NOTE — Progress Notes (Addendum)
[place] MD/PA/NP OP Progress Note  10/13/2017 8:01 AM Diana Page  MRN:  [id number]  Chief Complaint: I am feeling better.  I have more energy.  I like Ativan it does not make me tired  HPI: Patient came for her follow-up appointment.  On her last visit we increased her Symbyax and switch from Xanax to Ativan.  She is feeling better.  She has more energy and motivation during the day.  She does not sleep all day and all night.  She is also thinking to going back to school.  She is hoping to start school in August and she wants to do a nursing.  She is also looking a part-time job.  Her relationship with her husband is going very well.  Patient denies any fatigue or any crying spells.  She denies any suicidal thoughts or homicidal thought.  Her energy level is improved from the past.  She has no tremors, shakes or any EPS.  She is seeing Charolotte Eke for therapy.  She is also thinking about bariatric surgery.  She appointment with [facility] surgery for initial evaluation.  She had tried a lot of things for weight loss but little help.  Patient denies drinking alcohol or using any illegal substances.  She denies any feeling of hopelessness or worthlessness.  She wants to continue current medication.  She has taken few times Ativan for panic attacks.  Her OCD is under control.  Visit Diagnosis:    ICD-10-CM   1. Other obsessive-compulsive disorders F42.8 LORazepam (ATIVAN) 0.5 MG tablet  2. Major depressive disorder, recurrent episode, moderate (HCC) F33.1 LORazepam (ATIVAN) 0.5 MG tablet    OLANZapine-FLUoxetine (SYMBYAX) 6-50 MG capsule    Past Psychiatric History: Reviewed Patient has history of anxiety and diagnosed with OCD.  She took Prozac when she was in [place].  She denies any history of hallucination, paranoia, suicidal attempt or any psychiatric inpatient treatment.  She had a good response with Symbyax.  She tried Xanax but it make her very tired and sleepy.  Past Medical History:   Past Medical History:  Diagnosis Date  . Anemia   . Anxiety   . Back pain   . Depression   . Fatty liver   . Gallbladder problem   . GERD (gastroesophageal reflux disease)   . Hypertension    Intercranial Hypertension  . IBS (irritable bowel syndrome)   . Joint pain   . Obsessive compulsive disorder 06/14  . Ostomy nurse consultation   . Pancreatitis   . Pancreatitis    hx of   . SOB (shortness of breath)   . UC (ulcerative colitis) [facility])     Past Surgical History:  Procedure Laterality Date  . CHOLECYSTECTOMY    . LAPAROSCOPIC PARTIAL PROTECTOMY    . PERMANENT ILEOSTOMY    . TOTAL COLECTOMY N/A     Family Psychiatric History: Reviewed  Family History:  Family History  Problem Relation Age of Onset  . OCD Mother   . Diabetes Mother   . Depression Mother   . Anxiety disorder Mother   . Obesity Mother   . Obesity Father   . OCD Sister   . Depression Sister   . OCD Brother   . Anxiety disorder Brother     Social History:  Social History   Socioeconomic History  . Marital status: Married    Spouse name: Not on file  . Number of children: 0  . Years of education: Ba  . Highest  education level: Not on file  Occupational History  . Occupation: Librarian, academic     Comment: [employer]  . Financial resource strain: Not on file  . Food insecurity:    Worry: Not on file    Inability: Not on file  . Transportation needs:    Medical: Not on file    Non-medical: Not on file  Tobacco Use  . Smoking status: Former Smoker    Types: Cigarettes, E-cigarettes    Last attempt to quit: 06/27/2009    Years since quitting: 8.3  . Smokeless tobacco: Never Used  Substance and Sexual Activity  . Alcohol use: No    Alcohol/week: 0.0 oz  . Drug use: No  . Sexual activity: Not Currently    Partners: Male    Birth control/protection: Condom, Pill  Lifestyle  . Physical activity:    Days per week: Not on file    Minutes per session: Not on file  .  Stress: Not on file  Relationships  . Social connections:    Talks on phone: Not on file    Gets together: Not on file    Attends religious service: Not on file    Active member of club or organization: Not on file    Attends meetings of clubs or organizations: Not on file    Relationship status: Not on file  Other Topics Concern  . Not on file  Social History Narrative   Lives at home with boyfriend, Lennette Bihari.   Caffeine use: Tea occass    Allergies:  Allergies  Allergen Reactions  . Nsaids Other (See Comments)    Because of her UC    Metabolic Disorder Labs: Lab Results  Component Value Date   HGBA1C 5.1 09/07/2016   No results found for: PROLACTIN Lab Results  Component Value Date   CHOL 157 09/07/2016   TRIG 143 09/07/2016   HDL 34 (L) 09/07/2016   LDLCALC 94 09/07/2016   Lab Results  Component Value Date   TSH 1.740 09/07/2016   TSH 2.921 12/28/2014    Therapeutic Level Labs: No results found for: LITHIUM No results found for: VALPROATE No components found for:  CBMZ  Current Medications: Current Outpatient Medications  Medication Sig Dispense Refill  . SPRINTEC 28 0.25-35 MG-MCG tablet Take 1 tablet by mouth daily.  4  . acetaZOLAMIDE (DIAMOX) 500 MG capsule TAKE 1 CAPSULE (500 MG TOTAL) BY MOUTH 3 (THREE) TIMES DAILY. 90 capsule 1  . LORazepam (ATIVAN) 0.5 MG tablet Take 1 tablet (0.5 mg total) by mouth daily as needed for anxiety. 15 tablet 0  . OLANZapine-FLUoxetine (SYMBYAX) 6-50 MG capsule Take 1 capsule by mouth at bedtime. 30 capsule 0  . Ostomy Supplies MISC by Does not apply route.    Marland Kitchen oxyCODONE-acetaminophen (PERCOCET/ROXICET) 5-325 MG tablet Take 1 tablet by mouth 2 (two) times daily.  0   No current facility-administered medications for this visit.      Musculoskeletal: Strength & Muscle Tone: within normal limits Gait & Station: normal Patient leans: N/A  Psychiatric Specialty Exam: ROS  Blood pressure 118/78, pulse 62, resp. rate  16, weight 223 lb (101.2 kg), SpO2 98 %.Body mass index is 40.79 kg/m.  General Appearance: Casual  Eye Contact:  Good  Speech:  Clear and Coherent  Volume:  Normal  Mood:  Euthymic  Affect:  Congruent  Thought Process:  Goal Directed  Orientation:  Full (Time, Place, and Person)  Thought Content: Logical   Suicidal  Thoughts:  No  Homicidal Thoughts:  No  Memory:  Immediate;   Good Recent;   Good Remote;   Good  Judgement:  Good  Insight:  Good  Psychomotor Activity:  Normal  Concentration:  Concentration: Good and Attention Span: Good  Recall:  Good  Fund of Knowledge: Good  Language: Good  Akathisia:  No  Handed:  Right  AIMS (if indicated): not done  Assets:  Communication Skills Desire for Improvement Housing  ADL's:  Intact  Cognition: WNL  Sleep:  Good   Screenings: PHQ2-9     Office Visit from 09/07/2016 in [facility] from [facility]/08/2016 [facility]  PHQ-2 Total Score  4  1  PHQ-9 Total Score  15  -       Assessment and Plan: Obsessive-compulsive disorder.  Generalized anxiety disorder.  Patient doing better since Symbyax dose increase.  She has taken few times Ativan for panic attacks.  Patient interested in bariatric surgery and trying to get initial evaluation from [facility] [place] [facility].  Discussed medication side effects and benefits.  I also discussed that she should do research and attend orientation related to bariatric surgery.  She is feeling much better and like to continue current medication.  I recommended to continue counseling with [facility].  Recommended to call us back if she has any question or any concern.  Follow-up in 3 months.   Kathlee Nations, MD 10/13/2017, 8:01 AM

## 2017-10-24 ENCOUNTER — Other Ambulatory Visit: Payer: Self-pay | Admitting: Neurology

## 2017-10-24 ENCOUNTER — Encounter: Payer: Self-pay | Admitting: Neurology

## 2017-10-24 ENCOUNTER — Ambulatory Visit: Payer: No Typology Code available for payment source | Admitting: Neurology

## 2017-10-24 VITALS — BP 117/73 | HR 97 | Ht 62.0 in | Wt 227.0 lb

## 2017-10-24 DIAGNOSIS — G932 Benign intracranial hypertension: Secondary | ICD-10-CM

## 2017-10-24 NOTE — Progress Notes (Signed)
[name]    Provider:  [name] Referring Provider: Nolene Ebbs, MD Primary Care Physician:  Patient, No Pcp Per   CC: Headache and vision loss, dizziness  Interval history 10/24/2017: She is doing well. Still on the Diamox. Doing well. She will be going to Bariatric surgery referral which I recommend as weight loss is critical due to her condition and risk of permanent vision loss. IIH is associated with obesity and bariatric surgery is indicated in this case. No headaches, no vision changes, feel stable on medication. She is having dizziness at night which can be a symptom, needs bariatric surgery.   Interval history 01/23/2017: She has had 2 headaches in the last 6 months but it was in the setting of dehydration.  Her weight is fluctuating. She was 212 recently which is her highest. She saw [name] for her weight and she was placed on metformin and had side effects. She is watching her diet, prefers to try differently. She is going back to school and quitting work, getting married in [place]. No headaches, vision is fine no vision changes, she sees [name] every 4 months, doing well on the diamox, visual fields are stable,  No more edema of the eyes. Discussed Bariatric surgery.    Interval history 07/25/2016: 27 year old here for follow up on IIH on acetazolamide and obesity. She is getting married in November. No dizziness. Rare headaches while on the medication however she is on 1541m diamox a day and having a difficult time losing weight which is critical in IIH. Her headaches are better,  1-2 since being last seen. No vision changes, no dizziness, no hearing changes. She is following weight watchers now and she saw a dietician but still having difficulty with weight loss.  She stopped eating meat and is worried about her B12 and iron. No snoring, no morning headache. She takes 15057macetazolamide. She has missed 3 periods but 2 pregnancy tests are negative.  They use condoms.  She is on 150051miamox daily and I can decrease this dose if she can lose 10% of her body weight, we may be able to get her off of medication with even more weight loss. Will refer to the [facility] for weight loss. She is having a difficult time with weight loss and I recommend the [facility]iscussed the role of weight in IIH and vision loss.   Interval history 03/21/2016: [age]ar old female with papilledema, dizziness, positional headache, vision and hearing changes. Opening pressure was 34.5, improved on diamox. Abnormal MRI with abnormal signal within the medial temporal lobes bilaterally predominantly involving the hippocampal structures. She was working hard, she was not drinking water and she was lifting a lot of things and she was running all day and she felt lightheaded and dizzy and fatigued. She went and sat down for 5 minutes and she was ok. She was not drinking any water. She has one headache that lasted a whole day and a whole night it was terrible. She tried giving up caffeine. Likely caffeine withdrawal. Restared caffeine and fine. Vision has been fine. No snoring, no morning headaches. She takes 1500m69metazolamide.   Interval history 09/16/2015: She is having worsening blurry vision. This started within the last month. Things are not as crisp. No pressure. She is also hearing low level, wooshing in the ears. Worse when she is laying down and then stands. No headaches, no pressure. No side effects  from the diamox. She has not been able to lose weight. Will keep her on the current dose and then if weight loss of 10% will decreasing the diamox. Need to repeat MRi of the brain due to new worsneing vision and hearing issues given previously seen Abnormal signal within the medial temporal lobes bilaterally predominantly involving the hippocampal structures.  MRI 09/26/2015: FINDINGS:  The brain parenchyma shows no  significant abnormalities except subtle prominence and thickening of the right hippocampus and medial temporal lobe which is less prominent compared to the previous scan and is of unclear significance. No other structural lesion, tumor infarcts are noted. Diffusion weighted imaging is negative for acute ischemia. Susceptibility weighted imaging is negative for microhemorrhages. Subarachnoid spaces and ventricular system appear normal. Extraction brain structures up and normal. Calvarium shows no abnormalities. The pituitary gland and cerebellar tonsils appear normal. Flow voids of the large vessels are intact and circulation appear to be patent. Orbits and paranasal sinuses appear normal. Visualized portions of the cervical spine appears normal. Contrast images do not result in any abnormal areas of enhancement.     IMPRESSION: Slightly abnormal MRI scan of the brain showing subtle prominence of the right medial temporal lobe which is of unclear significance. Overall no significant change compared with previous MRI dated 01/16/2015.  Interval update: Opening pressure of LP was 34.5. 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 the thumping and pressure in her head have improved. The peripheral vision changes are gone, no headaches. She needs follow up with ophthalmology. She needs an ophthalmologist to follow in the area, will refer to [name]. She denies any current side effects from the diamox.   HPI: Diana Page is a 27 y.o. female here as a referral from Dr. Jeanie Cooks for dizziness.  PMHx obesity. She was seen in the ED for acute onset headache and dizziness with decreased peripheral vision. She had an abnormal MRI and was called back to the ED for lumbar puncture which showed  negative/normal CSF analysis. Unfortunately the LP did not include an opening pressure. She felt much better after the LP however. Currently if she stands up, she gets a thumping in the head. She gets stars in her right >Left peripheral vision. She gets pressure in the head all over. If she lays on her stomach, the pressure is worse and she can feel her heartbeat in her head. She went to optometry who diagnosed edema of the optic disks. Right eye has blurry vision which is persistent. She has had severe headaches. The dizziness has resolved after the LP. Symptoms felt much better after LP. Denies seizure-like events, staring, altered awareness. Symptoms are daily. No other associated focal neurologic deficits. No neck pain.   Reviewed notes, labs and imaging from outside physicians, which showed: MRi brain 12/27/2014: Personally reviewed images: 1. Abnormal signal within the medial temporal lobes bilaterally predominantly involving the hippocampal structures. This is concerning for infectious process including herpes encephalitis. Other viral encephalitides can give a similar picture. Limbic encephalitis can look like this, but is typically associated with lung cancer. Alternatively, this could be related to seizure activity. The patient has no history of seizures. 2. Question abnormal signal within the anterior aspect of the right optic nerve, potentially related to same process.   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 Widening of the optic nerve sheath. This can be observed with intracranial hypertension, and is commonly seen with pseudotumor cerebri. 2. The subtle  signal changes in the mesial temporal lobes appear unchanged. Although initially worrisome for an infectious or limbic encephalopathy, the stability and the absence of specific clinical symptoms make this less likely. If clinically indicated, consider follow-up evaluation or follow-up CSF analysis  Labs:   CSF: protein, glucose, diff, cell count normal TSH, B12, B1, HIV wnl CRP, ESR elevated CMP unremarkable CBC w/ anemia   Review of Systems: Patient complains of symptoms per HPI as well as the following symptoms: No CP, no SOB. Pertinent negatives per HPI. All others negative.  Social History   Socioeconomic History  . Marital status: Married    Spouse name: Not on file  . Number of children: 0  . Years of education: Ba  . Highest education level: Not on file  Occupational History  . Occupation: Librarian, academic     Comment: [employer]  . Financial resource strain: Not on file  . Food insecurity:    Worry: Not on file    Inability: Not on file  . Transportation needs:    Medical: Not on file    Non-medical: Not on file  Tobacco Use  . Smoking status: Former Smoker    Types: Cigarettes, E-cigarettes    Last attempt to quit: 06/27/2009    Years since quitting: 8.3  . Smokeless tobacco: Never Used  Substance and Sexual Activity  . Alcohol use: Yes    Alcohol/week: 0.0 oz    Comment: occasionally   . Drug use: No  . Sexual activity: Not Currently    Partners: Male    Birth control/protection: Condom, Pill  Lifestyle  . Physical activity:    Days per week: Not on file    Minutes per session: Not on file  . Stress: Not on file  Relationships  . Social connections:    Talks on phone: Not on file    Gets together: Not on file    Attends religious service: Not on file    Active member of club or organization: Not on file    Attends meetings of clubs or organizations: Not on file    Relationship status: Not on file  . Intimate partner violence:     Fear of current or ex partner: Not on file    Emotionally abused: Not on file    Physically abused: Not on file    Forced sexual activity: Not on file  Other Topics Concern  . Not on file  Social History Narrative   Lives at home with husband, Lennette Bihari.   Caffeine use: 2-3 cups daily   Right handed    Family History  Problem Relation Age of Onset  . OCD Mother   . Diabetes Mother   . Depression Mother   . Anxiety disorder Mother   . Obesity Mother   . Obesity Father   . OCD Sister   . Depression Sister   . OCD Brother   . Anxiety disorder Brother     Past Medical History:  Diagnosis Date  . Anemia   . Anxiety   . Back  pain   . Depression   . Fatty liver   . Gallbladder problem   . GERD (gastroesophageal reflux disease)   . Hypertension    Intercranial Hypertension  . IBS (irritable bowel syndrome)   . Joint pain   . Obsessive compulsive disorder 06/14  . Ostomy nurse consultation   . Pancreatitis   . Pancreatitis    hx of   . SOB (shortness of breath)   . UC (ulcerative colitis) [facility])     Past Surgical History:  Procedure Laterality Date  . CHOLECYSTECTOMY    . LAPAROSCOPIC PARTIAL PROTECTOMY    . PERMANENT ILEOSTOMY    . TOTAL COLECTOMY N/A     Current Outpatient Medications  Medication Sig Dispense Refill  . acetaZOLAMIDE (DIAMOX) 500 MG capsule TAKE 1 CAPSULE (500 MG TOTAL) BY MOUTH 3 (THREE) TIMES DAILY. 90 capsule 1  . Incontinence Supplies (DRAINABLE FECAL COLLECTOR) MISC by Does not apply route.    Marland Kitchen LORazepam (ATIVAN) 0.5 MG tablet Take 1 tablet (0.5 mg total) by mouth daily as needed for anxiety. 15 tablet 0  . OLANZapine-FLUoxetine (SYMBYAX) 6-50 MG capsule Take 1 capsule by mouth at bedtime. 90 capsule 0  . Ostomy Supplies MISC by Does not apply route.    . SPRINTEC 28 0.25-35 MG-MCG tablet Take 1 tablet by mouth daily.  4   No current facility-administered medications for this visit.     Allergies as of 10/24/2017 - Review Complete  10/24/2017  Allergen Reaction Noted  . Nsaids Other (See Comments) 07/17/2014  . Tolmetin  10/14/2014    Vitals: BP 117/73 (BP Location: Right Arm, Patient Position: Sitting)   Pulse 97   Ht 5' 2" (1.575 m)   Wt 227 lb (103 kg)   BMI 41.52 kg/m  Last Weight:  Wt Readings from Last 1 Encounters:  10/24/17 227 lb (103 kg)   Last Height:   Ht Readings from Last 1 Encounters:  10/24/17 5' 2" (1.575 m)   Physical exam: Exam: Gen: NAD, conversant, well nourised, obese, well groomed                     Eyes: Conjunctivae clear without exudates or hemorrhage  Neuro: Detailed Neurologic Exam  Speech:    Speech is normal; fluent and spontaneous with normal comprehension.  Cognition:    The patient is oriented to person, place, and time;   Cranial Nerves:    The pupils are equal, round, and reactive to light. The fundi are stable, mild nasal blurring. Visual fields are full to finger confrontation. Extraocular movements are intact. Trigeminal sensation is intact and the muscles of mastication are normal. The face is symmetric. The palate elevates in the midline. Hearing intact. Voice is normal. Shoulder shrug is normal. The tongue has normal motion without fasciculations.   Motor Observation:    No asymmetry, no atrophy, and no involuntary movements noted. Tone:    Normal muscle tone.    Posture:    Posture is normal. normal erect    Strength:    Strength is V/V in the upper and lower limbs.       Assessment/Plan: 27year old female with papilledema, dizziness, positional headache, vision and hearing changes, obesity. Opening pressure was 34.5, improved on diamox but having difficulty with weight loss. Abnormal MRI with abnormal signal within the medial temporal lobes bilaterally predominantly involving the hippocampal structures. 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 Repeat MRi showed subtle signal changes in the mesial temporal lobes unchanged, although initially worrisome for an infectious or limbic encephalopathy, the stability and the absence of specific clinical symptoms make this less likely.  Discussed weight loss as is critical and discussed weight in the pathophysiology of IIH; refer to Bariatric surgery, bariatric surgery is indicated due to her condition Continue diamox. Discussed teratogenicity, she is getting married next year, advised weight loss before children, although diamox is generally considered compatible with pregnancy no medication is entirely safe. Diana Page advised we can decrease the medication if she loses 10% of body weight and we may be able to entirely stop the diamox with a healthy weight. Will refer to [facility]. Should proceed to ED if vision significantly worsens Follow up with [name] was very positive, she sees him regularly  Discussed: To prevent or relieve headaches, try the following: Cool Compress. Lie down and place a cool compress on your head.  Avoid headache triggers. If certain foods or odors seem to have triggered your migraines in the past, avoid them. A headache diary might help you identify triggers.  Include physical activity in your daily routine. Try a daily walk or other moderate aerobic exercise.  Manage stress. Find healthy ways to cope with the stressors, such as delegating tasks on your to-do list.  Practice relaxation techniques. Try deep breathing, yoga, massage and visualization.  Eat regularly. Eating regularly scheduled meals and maintaining a healthy diet might help prevent headaches. Also, drink plenty of fluids.  Follow a regular sleep schedule. Sleep deprivation might  contribute to headaches Consider biofeedback. With this mind-body technique, you learn to control certain bodily functions - such as muscle tension, heart rate and blood pressure - to prevent headaches or reduce headache pain.    Proceed to emergency room if you experience new or worsening symptoms or symptoms do not resolve, if you have new neurologic symptoms or if headache is severe, or for any concerning symptom.   Provided education and documentation from American headache Society toolbox including articles on: chronic migraine medication overuse headache, chronic migraines, prevention of migraines, behavioral and other nonpharmacologic treatments for headache.    Sarina Ill, MD  [facility] [address] [place], [place] [address]  Phone [phone] Fax [phone]  A total of 15 minutes was spent face-to-face with this patient. Over half this time was spent on counseling patient on the IIH and obesity diagnosis and different diagnostic and therapeutic options available.

## 2017-10-25 ENCOUNTER — Ambulatory Visit (HOSPITAL_COMMUNITY): Payer: No Typology Code available for payment source | Admitting: Licensed Clinical Social Worker

## 2018-01-12 ENCOUNTER — Other Ambulatory Visit (HOSPITAL_COMMUNITY): Payer: Self-pay | Admitting: Psychiatry

## 2018-01-12 DIAGNOSIS — F331 Major depressive disorder, recurrent, moderate: Secondary | ICD-10-CM

## 2018-01-15 ENCOUNTER — Other Ambulatory Visit (HOSPITAL_COMMUNITY): Payer: Self-pay

## 2018-01-15 DIAGNOSIS — F428 Other obsessive-compulsive disorder: Secondary | ICD-10-CM

## 2018-01-15 DIAGNOSIS — F331 Major depressive disorder, recurrent, moderate: Secondary | ICD-10-CM

## 2018-01-15 MED ORDER — OLANZAPINE-FLUOXETINE HCL 6-50 MG PO CAPS: 1.0000 | ORAL_CAPSULE | Freq: Every day | ORAL | 0 refills | Status: DC

## 2018-01-15 MED ORDER — LORAZEPAM 0.5 MG PO TABS: 0.5000 mg | ORAL_TABLET | Freq: Every day | ORAL | 0 refills | Status: DC | PRN

## 2018-01-18 ENCOUNTER — Ambulatory Visit (HOSPITAL_COMMUNITY): Payer: No Typology Code available for payment source | Admitting: Psychiatry

## 2018-02-13 ENCOUNTER — Ambulatory Visit (HOSPITAL_COMMUNITY): Payer: No Typology Code available for payment source | Admitting: Psychiatry

## 2018-04-05 ENCOUNTER — Other Ambulatory Visit (HOSPITAL_COMMUNITY): Payer: Self-pay | Admitting: Psychiatry

## 2018-04-05 DIAGNOSIS — F331 Major depressive disorder, recurrent, moderate: Secondary | ICD-10-CM

## 2018-04-10 ENCOUNTER — Other Ambulatory Visit (HOSPITAL_COMMUNITY): Payer: Self-pay

## 2018-04-10 DIAGNOSIS — F331 Major depressive disorder, recurrent, moderate: Secondary | ICD-10-CM

## 2018-04-10 MED ORDER — OLANZAPINE-FLUOXETINE HCL 6-50 MG PO CAPS: 1.0000 | ORAL_CAPSULE | Freq: Every day | ORAL | 0 refills | Status: DC

## 2018-04-27 NOTE — Progress Notes (Deleted)
[name]    Provider:  [name] Referring Provider: No ref. provider found Primary Care Physician:  Patient, No Pcp Per   CC: Headache and vision loss, dizziness   Interval history 04/30/2018:   Interval history 10/24/2017: She is doing well. Still on the Diamox. Doing well. She will be going to Bariatric surgery referral which I recommend as weight loss is critical due to her condition and risk of permanent vision loss. IIH is associated with obesity and bariatric surgery is indicated in this case. No headaches, no vision changes, feel stable on medication. She is having dizziness at night which can be a symptom, needs bariatric surgery.   Interval history 01/23/2017: She has had 2 headaches in the last 6 months but it was in the setting of dehydration.  Her weight is fluctuating. She was 212 recently which is her highest. She saw [name] for her weight and she was placed on metformin and had side effects. She is watching her diet, prefers to try differently. She is going back to school and quitting work, getting married in [place]. No headaches, vision is fine no vision changes, she sees [name] every 4 months, doing well on the diamox, visual fields are stable,  No more edema of the eyes. Discussed Bariatric surgery.    Interval history 07/25/2016: 27 year old here for follow up on IIH on acetazolamide and obesity. She is getting married in November. No dizziness. Rare headaches while on the medication however she is on 1558m diamox a day and having a difficult time losing weight which is critical in IIH. Her headaches are better,  1-2 since being last seen. No vision changes, no dizziness, no hearing changes. She is following weight watchers now and she saw a dietician but still having difficulty with weight loss.  She stopped eating meat and is worried about her B12 and iron. No snoring, no morning headache. She takes 15037macetazolamide. She has missed 3  periods but 2 pregnancy tests are negative. They use condoms.  She is on 150019miamox daily and I can decrease this dose if she can lose 10% of her body weight, we may be able to get her off of medication with even more weight loss. Will refer to the [facility] for weight loss. She is having a difficult time with weight loss and I recommend the [facility] [place] the role of weight in IIH and vision loss.   Interval history 03/21/2016: 24 5ar old female with papilledema, dizziness, positional headache, vision and hearing changes. Opening pressure was 34.5, improved on diamox. Abnormal MRI with abnormal signal within the medial temporal lobes bilaterally predominantly involving the hippocampal structures. She was working hard, she was not drinking water and she was lifting a lot of things and she was running all day and she felt lightheaded and dizzy and fatigued. She went and sat down for 5 minutes and she was ok. She was not drinking any water. She has one headache that lasted a whole day and a whole night it was terrible. She tried giving up caffeine. Likely caffeine withdrawal. Restared caffeine and fine. Vision has been fine. No snoring, no morning headaches. She takes 1500m60metazolamide.   Interval history 09/16/2015: She is having worsening blurry vision. This started within the last month. Things are not as crisp. No pressure. She is also hearing low level, wooshing in the ears. Worse when she is laying down and then stands.  No headaches, no pressure. No side effects from the diamox. She has not been able to lose weight. Will keep her on the current dose and then if weight loss of 10% will decreasing the diamox. Need to repeat MRi of the brain due to new worsneing vision and hearing issues given previously seen Abnormal signal within the medial temporal lobes bilaterally predominantly involving the hippocampal structures.  MRI 09/26/2015:  FINDINGS:  The brain parenchyma shows no significant abnormalities except subtle prominence and thickening of the right hippocampus and medial temporal lobe which is less prominent compared to the previous scan and is of unclear significance. No other structural lesion, tumor infarcts are noted. Diffusion weighted imaging is negative for acute ischemia. Susceptibility weighted imaging is negative for microhemorrhages. Subarachnoid spaces and ventricular system appear normal. Extraction brain structures up and normal. Calvarium shows no abnormalities. The pituitary gland and cerebellar tonsils appear normal. Flow voids of the large vessels are intact and circulation appear to be patent. Orbits and paranasal sinuses appear normal. Visualized portions of the cervical spine appears normal. Contrast images do not result in any abnormal areas of enhancement.  IMPRESSION: Slightly abnormal MRI scan of the brain showing subtle prominence of the right medial temporal lobe which is of unclear significance. Overall no significant change compared with previous MRI dated 01/16/2015.    Interval update: Opening pressure of LP was 34.5. 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 the thumping and pressure in her head have improved. The peripheral vision changes are gone, no headaches. She needs follow up with ophthalmology. She needs an ophthalmologist to follow in the area, will refer to [name]. She denies any current side effects from the diamox.   HPI: Diana Page is a 27 y.o. female here as a referral from Dr. Jeanie Cooks for dizziness.  PMHx obesity. She was seen in the ED for acute onset headache and dizziness with decreased peripheral vision. She had an abnormal MRI and was called back to the ED for  lumbar puncture which showed negative/normal CSF analysis. Unfortunately the LP did not include an opening pressure. She felt much better after the LP however. Currently if she stands up, she gets a thumping in the head. She gets stars in her right >Left peripheral vision. She gets pressure in the head all over. If she lays on her stomach, the pressure is worse and she can feel her heartbeat in her head. She went to optometry who diagnosed edema of the optic disks. Right eye has blurry vision which is persistent. She has had severe headaches. The dizziness has resolved after the LP. Symptoms felt much better after LP. Denies seizure-like events, staring, altered awareness. Symptoms are daily. No other associated focal neurologic deficits. No neck pain.   Reviewed notes, labs and imaging from outside physicians, which showed: MRi brain 12/27/2014: Personally reviewed images: 1. Abnormal signal within the medial temporal lobes bilaterally predominantly involving the hippocampal structures. This is concerning for infectious process including herpes encephalitis. Other viral encephalitides can give a similar picture. Limbic encephalitis can look like this, but is typically associated with lung cancer. Alternatively, this could be related to seizure activity. The patient has no history of seizures. 2. Question abnormal signal within the anterior aspect of the right optic nerve, potentially related to same process.   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 Widening of the optic nerve sheath. This can be observed with intracranial hypertension, and is commonly seen with  pseudotumor cerebri. 2. The subtle signal changes in the mesial temporal lobes appear unchanged. Although initially worrisome for an infectious or limbic encephalopathy, the stability and the absence of specific clinical symptoms make this less likely. If clinically indicated, consider follow-up evaluation or follow-up CSF analysis  Labs:   CSF: protein, glucose, diff, cell count normal TSH, B12, B1, HIV wnl CRP, ESR elevated CMP unremarkable CBC w/ anemia   Review of Systems: Patient complains of symptoms per HPI as well as the following symptoms: No CP, no SOB. Pertinent negatives per HPI. All others negative.  Social History   Socioeconomic History  . Marital status: Married    Spouse name: Not on file  . Number of children: 0  . Years of education: Ba  . Highest education level: Not on file  Occupational History  . Occupation: Librarian, academic     Comment: [employer]  . Financial resource strain: Not on file  . Food insecurity:    Worry: Not on file    Inability: Not on file  . Transportation needs:    Medical: Not on file    Non-medical: Not on file  Tobacco Use  . Smoking status: Former Smoker    Types: Cigarettes, E-cigarettes    Last attempt to quit: 06/27/2009    Years since quitting: 8.8  . Smokeless tobacco: Never Used  Substance and Sexual Activity  . Alcohol use: Yes    Alcohol/week: 0.0 standard drinks    Comment: occasionally   . Drug use: No  . Sexual activity: Not Currently    Partners: Male    Birth control/protection: Condom, Pill  Lifestyle  . Physical activity:    Days per week: Not on file    Minutes per session: Not on file  . Stress: Not on file  Relationships  . Social connections:    Talks on phone: Not on file    Gets together: Not on file    Attends religious service: Not on file    Active member of club or organization: Not on file    Attends meetings of clubs or organizations: Not on file    Relationship  status: Not on file  . Intimate partner violence:    Fear of current or ex partner: Not on file    Emotionally abused: Not on file    Physically abused: Not on file    Forced sexual activity: Not on file  Other Topics Concern  . Not on file  Social History Narrative   Lives at home with husband, Lennette Bihari.   Caffeine use: 2-3 cups daily   Right handed    Family History  Problem Relation Age of Onset  . OCD Mother   . Diabetes Mother   . Depression Mother   . Anxiety disorder Mother   . Obesity Mother   . Obesity Father   . OCD Sister   . Depression Sister   . OCD Brother   . Anxiety disorder Brother     Past Medical History:  Diagnosis Date  . Anemia   .  Anxiety   . Back pain   . Depression   . Fatty liver   . Gallbladder problem   . GERD (gastroesophageal reflux disease)   . Hypertension    Intercranial Hypertension  . IBS (irritable bowel syndrome)   . Joint pain   . Obsessive compulsive disorder 06/14  . Ostomy nurse consultation   . Pancreatitis   . Pancreatitis    hx of   . SOB (shortness of breath)   . UC (ulcerative colitis) [facility])     Past Surgical History:  Procedure Laterality Date  . CHOLECYSTECTOMY    . LAPAROSCOPIC PARTIAL PROTECTOMY    . PERMANENT ILEOSTOMY    . TOTAL COLECTOMY N/A     Current Outpatient Medications  Medication Sig Dispense Refill  . acetaZOLAMIDE (DIAMOX) 500 MG capsule TAKE 1 CAPSULE (500 MG TOTAL) BY MOUTH 3 (THREE) TIMES DAILY. 270 capsule 11  . Incontinence Supplies (DRAINABLE FECAL COLLECTOR) MISC by Does not apply route.    Marland Kitchen LORazepam (ATIVAN) 0.5 MG tablet Take 1 tablet (0.5 mg total) by mouth daily as needed for anxiety. 15 tablet 0  . OLANZapine-FLUoxetine (SYMBYAX) 6-50 MG capsule Take 1 capsule by mouth at bedtime. 30 capsule 0  . Ostomy Supplies MISC by Does not apply route.    . SPRINTEC 28 0.25-35 MG-MCG tablet Take 1 tablet by mouth daily.  4   No current facility-administered medications for this visit.      Allergies as of 04/30/2018 - Review Complete 10/24/2017  Allergen Reaction Noted  . Nsaids Other (See Comments) 07/17/2014  . Tolmetin  10/14/2014    Vitals: There were no vitals taken for this visit. Last Weight:  Wt Readings from Last 1 Encounters:  10/24/17 227 lb (103 kg)   Last Height:   Ht Readings from Last 1 Encounters:  10/24/17 5' 2"  (1.575 m)   Physical exam: Exam: Gen: NAD, conversant, well nourised, obese, well groomed                     Eyes: Conjunctivae clear without exudates or hemorrhage  Neuro: Detailed Neurologic Exam  Speech:    Speech is normal; fluent and spontaneous with normal comprehension.  Cognition:    The patient is oriented to person, place, and time;   Cranial Nerves:    The pupils are equal, round, and reactive to light. The fundi are stable, mild nasal blurring. Visual fields are full to finger confrontation. Extraocular movements are intact. Trigeminal sensation is intact and the muscles of mastication are normal. The face is symmetric. The palate elevates in the midline. Hearing intact. Voice is normal. Shoulder shrug is normal. The tongue has normal motion without fasciculations.   Motor Observation:    No asymmetry, no atrophy, and no involuntary movements noted. Tone:    Normal muscle tone.    Posture:    Posture is normal. normal erect    Strength:    Strength is V/V in the upper and lower limbs.       Assessment/Plan: 27year old female with papilledema, dizziness, positional headache, vision and hearing changes, obesity. Opening pressure was 34.5, improved on diamox but having difficulty with weight loss. Abnormal MRI with abnormal signal within the medial temporal lobes bilaterally predominantly involving the hippocampal structures. 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 Repeat MRi showed subtle signal changes in the mesial temporal lobes unchanged, although initially worrisome for an infectious or limbic encephalopathy, the stability and the absence of specific clinical symptoms make this less likely.  Discussed weight loss as is critical and discussed weight in the pathophysiology of IIH; refer to Bariatric surgery, bariatric surgery is indicated due to her condition Continue diamox. Discussed teratogenicity, she is getting married next year, advised weight loss before children, although diamox is generally considered compatible with pregnancy no medication is entirely safe. Janeal Holmes advised we can decrease the medication if she loses 10% of body weight and we may be able to entirely stop the diamox with a healthy weight. Will refer to [facility]. Should proceed to ED if vision significantly worsens Follow up with [name] was very positive, she sees him regularly  Discussed: To prevent or relieve headaches, try the following: Cool Compress. Lie down and place a cool compress on your head.  Avoid headache triggers. If certain foods or odors seem to have triggered your migraines in the past, avoid them. A headache diary might help you identify triggers.  Include physical activity in your daily routine. Try a daily walk or other moderate aerobic exercise.  Manage stress. Find healthy ways to cope with the stressors, such as delegating tasks on your to-do list.  Practice relaxation techniques. Try deep breathing, yoga, massage and visualization.  Eat regularly. Eating regularly scheduled meals and maintaining a healthy diet might help prevent headaches. Also, drink plenty of fluids.  Follow a regular sleep schedule. Sleep deprivation might contribute to headaches Consider biofeedback. With  this mind-body technique, you learn to control certain bodily functions - such as muscle tension, heart rate and blood pressure - to prevent headaches or reduce headache pain.    Proceed to emergency room if you experience new or worsening symptoms or symptoms do not resolve, if you have new neurologic symptoms or if headache is severe, or for any concerning symptom.   Provided education and documentation from American headache Society toolbox including articles on: chronic migraine medication overuse headache, chronic migraines, prevention of migraines, behavioral and other nonpharmacologic treatments for headache.    Sarina Ill, MD  [facility] [address] [place], [place] [address]  Phone [phone] Fax [phone]  A total of 15 minutes was spent face-to-face with this patient. Over half this time was spent on counseling patient on the IIH and obesity diagnosis and different diagnostic and therapeutic options available.

## 2018-04-30 ENCOUNTER — Ambulatory Visit: Payer: No Typology Code available for payment source | Admitting: Adult Health

## 2018-04-30 ENCOUNTER — Encounter: Payer: Self-pay | Admitting: Adult Health

## 2018-04-30 ENCOUNTER — Ambulatory Visit: Payer: No Typology Code available for payment source | Admitting: Neurology

## 2018-05-01 ENCOUNTER — Telehealth: Payer: Self-pay

## 2018-05-01 NOTE — Telephone Encounter (Signed)
Patient no show for appt on 04/30/2018.

## 2018-05-02 ENCOUNTER — Ambulatory Visit (HOSPITAL_COMMUNITY): Payer: No Typology Code available for payment source | Admitting: Psychiatry

## 2018-05-02 VITALS — BP 122/74 | Ht 62.0 in | Wt 241.0 lb

## 2018-05-02 DIAGNOSIS — F411 Generalized anxiety disorder: Secondary | ICD-10-CM | POA: Diagnosis not present

## 2018-05-02 DIAGNOSIS — F330 Major depressive disorder, recurrent, mild: Secondary | ICD-10-CM

## 2018-05-02 DIAGNOSIS — F429 Obsessive-compulsive disorder, unspecified: Secondary | ICD-10-CM

## 2018-05-02 MED ORDER — OLANZAPINE-FLUOXETINE HCL 6-50 MG PO CAPS: 1.0000 | ORAL_CAPSULE | Freq: Every day | ORAL | 0 refills | Status: DC

## 2018-05-02 MED ORDER — FLUVOXAMINE MALEATE 25 MG PO TABS: 25.0000 mg | ORAL_TABLET | Freq: Every day | ORAL | 2 refills | Status: DC

## 2018-05-02 NOTE — Progress Notes (Signed)
[facility] MD/PA/NP OP Progress Note  05/02/2018 3:36 PM Diana Page  MRN:  [id number]  Chief Complaint: I have some time anxiety and depression.  HPI: She came for her follow-up appointment.  She is taking her medication but she still sometimes have depression with lack of motivation to do things.  She feels some time having crying spells and feels disappointed.  She started school but she had to drop out because she could not do it.  She also start working but have to quit because it was overwhelming.  Now she is thinking to start online classes at [employer].  She is also looking for a job after the Christmas.  She is not sure what causing anxiety and depression but she feels sometimes disappointed.  She denies any suicidal thoughts or homicidal thought.  Her relationship with her husband is going well.  Together they recently bought a house.  She also in a process of getting bariatric surgery.  She had evaluation from the surgeon and now she is waiting from them.  Patient denies any paranoia, hallucination, suicidal thoughts.  She still have obsessive thoughts but her biggest concern is depression.  She has taken few times Ativan for panic attacks.  Her energy level is okay.  Patient denies drinking or using any illegal substances.  She is currently not seeing therapist but like to see someone to help her anxiety and OCD symptoms.    Visit Diagnosis:    ICD-10-CM   1. Obsessive-compulsive disorder, unspecified type F42.9 OLANZapine-FLUoxetine (SYMBYAX) 6-50 MG capsule    fluvoxaMINE (LUVOX) 25 MG tablet  2. GAD (generalized anxiety disorder) F41.1 fluvoxaMINE (LUVOX) 25 MG tablet  3. MDD (major depressive disorder), recurrent episode, mild (HCC) F33.0 OLANZapine-FLUoxetine (SYMBYAX) 6-50 MG capsule    fluvoxaMINE (LUVOX) 25 MG tablet    Past Psychiatric History: Reviewed. Patient has history of anxiety and diagnosed with OCD.  She took Prozac when she was in [place].  She denies any history of  hallucination, paranoia, suicidal attempt or any psychiatric inpatient treatment.  She had a good response with Symbyax.  She tried Xanax but it make her very tired and sleepy.  Past Medical History:  Past Medical History:  Diagnosis Date  . Anemia   . Anxiety   . Back pain   . Depression   . Fatty liver   . Gallbladder problem   . GERD (gastroesophageal reflux disease)   . Hypertension    Intercranial Hypertension  . IBS (irritable bowel syndrome)   . Joint pain   . Obsessive compulsive disorder 06/14  . Ostomy nurse consultation   . Pancreatitis   . Pancreatitis    hx of   . SOB (shortness of breath)   . UC (ulcerative colitis) [facility])     Past Surgical History:  Procedure Laterality Date  . CHOLECYSTECTOMY    . LAPAROSCOPIC PARTIAL PROTECTOMY    . PERMANENT ILEOSTOMY    . TOTAL COLECTOMY N/A     Family Psychiatric History: Viewed.  Family History:  Family History  Problem Relation Age of Onset  . OCD Mother   . Diabetes Mother   . Depression Mother   . Anxiety disorder Mother   . Obesity Mother   . Obesity Father   . OCD Sister   . Depression Sister   . OCD Brother   . Anxiety disorder Brother     Social History:  Social History   Socioeconomic History  . Marital status: Married    Spouse  name: Not on file  . Number of children: 0  . Years of education: Ba  . Highest education level: Not on file  Occupational History  . Occupation: Librarian, academic     Comment: [employer]  . Financial resource strain: Not on file  . Food insecurity:    Worry: Not on file    Inability: Not on file  . Transportation needs:    Medical: Not on file    Non-medical: Not on file  Tobacco Use  . Smoking status: Former Smoker    Types: Cigarettes, E-cigarettes    Last attempt to quit: 06/27/2009    Years since quitting: 8.8  . Smokeless tobacco: Never Used  Substance and Sexual Activity  . Alcohol use: Yes    Alcohol/week: 0.0 standard drinks     Comment: occasionally   . Drug use: No  . Sexual activity: Not Currently    Partners: Male    Birth control/protection: Condom, Pill  Lifestyle  . Physical activity:    Days per week: Not on file    Minutes per session: Not on file  . Stress: Not on file  Relationships  . Social connections:    Talks on phone: Not on file    Gets together: Not on file    Attends religious service: Not on file    Active member of club or organization: Not on file    Attends meetings of clubs or organizations: Not on file    Relationship status: Not on file  Other Topics Concern  . Not on file  Social History Narrative   Lives at home with husband, Lennette Bihari.   Caffeine use: 2-3 cups daily   Right handed    Allergies:  Allergies  Allergen Reactions  . Nsaids Other (See Comments)    Because of her UC  . Tolmetin     Other reaction(s): Other (See Comments) Because of her UC    Metabolic Disorder Labs: Lab Results  Component Value Date   HGBA1C 5.1 09/07/2016   No results found for: PROLACTIN Lab Results  Component Value Date   CHOL 157 09/07/2016   TRIG 143 09/07/2016   HDL 34 (L) 09/07/2016   LDLCALC 94 09/07/2016   Lab Results  Component Value Date   TSH 1.740 09/07/2016   TSH 2.921 12/28/2014    Therapeutic Level Labs: No results found for: LITHIUM No results found for: VALPROATE No components found for:  CBMZ  Current Medications: Current Outpatient Medications  Medication Sig Dispense Refill  . acetaZOLAMIDE (DIAMOX) 500 MG capsule TAKE 1 CAPSULE (500 MG TOTAL) BY MOUTH 3 (THREE) TIMES DAILY. 270 capsule 11  . Incontinence Supplies (DRAINABLE FECAL COLLECTOR) MISC by Does not apply route.    Marland Kitchen LORazepam (ATIVAN) 0.5 MG tablet Take 1 tablet (0.5 mg total) by mouth daily as needed for anxiety. 15 tablet 0  . OLANZapine-FLUoxetine (SYMBYAX) 6-50 MG capsule Take 1 capsule by mouth at bedtime. 30 capsule 0  . Ostomy Supplies MISC by Does not apply route.    . SPRINTEC 28  0.25-35 MG-MCG tablet Take 1 tablet by mouth daily.  4   No current facility-administered medications for this visit.      Musculoskeletal: Strength & Muscle Tone: within normal limits Gait & Station: normal Patient leans: N/A  Psychiatric Specialty Exam: ROS  Blood pressure 122/74, height 5' 2"  (1.575 m), weight 241 lb (109.3 kg).There is no height or weight on file to calculate BMI.  General  Appearance: Casual  Eye Contact:  Good  Speech:  Normal Rate  Volume:  Normal  Mood:  Anxious and Dysphoric  Affect:  Constricted  Thought Process:  Goal Directed  Orientation:  Full (Time, Place, and Person)  Thought Content: Logical   Suicidal Thoughts:  No  Homicidal Thoughts:  No  Memory:  Immediate;   Good Recent;   Good Remote;   Good  Judgement:  Good  Insight:  Good  Psychomotor Activity:  Decreased  Concentration:  Concentration: Fair and Attention Span: Fair  Recall:  Good  Fund of Knowledge: Good  Language: Good  Akathisia:  No  Handed:  Right  AIMS (if indicated): not done  Assets:  Communication Skills Desire for Improvement Housing Social Support  ADL's:  Intact  Cognition: WNL  Sleep:  Fair   Screenings: PHQ2-9     Office Visit from 09/07/2016 in [facility]  PHQ-2 Total Score  4  1  PHQ-9 Total Score  15  -       Assessment and Plan: Obsessive-compulsive disorder.  Generalized anxiety disorder.  Major depressive disorder.  Panic attacks.  We talked about changing her medication but patient reluctant to change her Symbyax.  I recommended to try low-dose Luvox to help her OCD and depression.  Discussed medication side effects and benefits.  She is in a process of getting bariatric surgery.  I also recommended to see a therapist and given the name of Hillis Range for therapy.  I will continue Symbyax 9-50 milligrams at bedtime.  She does not need a new prescription of  Ativan.  We will start Luvox 25 mg at bedtime.  I recommended to call us back if symptoms started to get worse or if she has any question.  I will see her again in 6 weeks to 2 months.   Kathlee Nations, MD 05/02/2018, 3:36 PM

## 2018-05-04 ENCOUNTER — Other Ambulatory Visit (HOSPITAL_COMMUNITY): Payer: Self-pay | Admitting: Surgery

## 2018-05-08 ENCOUNTER — Ambulatory Visit (HOSPITAL_COMMUNITY): Payer: 59

## 2018-05-08 ENCOUNTER — Encounter (HOSPITAL_COMMUNITY): Payer: Self-pay

## 2018-05-09 ENCOUNTER — Encounter: Payer: Self-pay | Admitting: Adult Health

## 2018-05-09 ENCOUNTER — Ambulatory Visit: Payer: No Typology Code available for payment source | Admitting: Adult Health

## 2018-05-09 VITALS — BP 120/74 | HR 88 | Ht 62.0 in | Wt 242.0 lb

## 2018-05-09 DIAGNOSIS — G932 Benign intracranial hypertension: Secondary | ICD-10-CM

## 2018-05-09 NOTE — Progress Notes (Signed)
[name]    Provider:  [name] Referring Provider: No ref. provider found Primary Care Physician:  Patient, No Pcp Per   CC: Headache and vision loss, dizziness   Interval history 05/09/2018: Patient is being seen today for six-month follow-up appointment.  She continues on Diamox which she is tolerating well without side effects.  She denies any visual changes or dizziness.  Does endorse having 2 headaches since prior visit but will take Tylenol and this would resolve them.  She does plan on undergoing bariatric surgery in spring 2020.  She does state that she spoke to coordinators this morning and does not have an exact date but should be around March.  She has had weight gain since prior appointment with BMI today 44.3 and prior BMI 41.  She recently was followed by [name] and denies any concerns during appointment or with assessment.  No further concerns at this time.  Denies new or worsening stroke/TIA symptoms.   Interval history 10/24/2017 AA: She is doing well. Still on the Diamox. Doing well. She will be going to Bariatric surgery referral which I recommend as weight loss is critical due to her condition and risk of permanent vision loss. IIH is associated with obesity and bariatric surgery is indicated in this case. No headaches, no vision changes, feel stable on medication. She is having dizziness at night which can be a symptom, needs bariatric surgery.   Interval history 01/23/2017 AA: She has had 2 headaches in the last 6 months but it was in the setting of dehydration.  Her weight is fluctuating. She was 212 recently which is her highest. She saw [name] for her weight and she was placed on metformin and had side effects. She is watching her diet, prefers to try differently. She is going back to school and quitting work, getting married in [place]. No headaches, vision is fine no vision changes, she sees [name] every 4 months, doing well on the  diamox, visual fields are stable,  No more edema of the eyes. Discussed Bariatric surgery.    Interval history 07/25/2016: 27 year old here for follow up on IIH on acetazolamide and obesity. She is getting married in November. No dizziness. Rare headaches while on the medication however she is on 1552m diamox a day and having a difficult time losing weight which is critical in IIH. Her headaches are better,  1-2 since being last seen. No vision changes, no dizziness, no hearing changes. She is following weight watchers now and she saw a dietician but still having difficulty with weight loss.  She stopped eating meat and is worried about her B12 and iron. No snoring, no morning headache. She takes 15093macetazolamide. She has missed 3 periods but 2 pregnancy tests are negative. They use condoms.  She is on 150048miamox daily and I can decrease this dose if she can lose 10% of her body weight, we may be able to get her off of medication with even more weight loss. Will refer to the [facility] for weight loss. She is having a difficult time with weight loss and I recommend the [facility] [place] the role of weight in IIH and vision loss.   Interval history 03/21/2016: [age]ar old female with papilledema, dizziness, positional headache, vision and hearing changes. Opening pressure was 34.5, improved on diamox. Abnormal MRI with abnormal signal within the medial temporal lobes bilaterally predominantly involving the hippocampal structures. She  was working hard, she was not drinking water and she was lifting a lot of things and she was running all day and she felt lightheaded and dizzy and fatigued. She went and sat down for 5 minutes and she was ok. She was not drinking any water. She has one headache that lasted a whole day and a whole night it was terrible. She tried giving up caffeine. Likely caffeine withdrawal. Restared caffeine and fine. Vision has been  fine. No snoring, no morning headaches. She takes 157m acetazolamide.   Interval history 09/16/2015: She is having worsening blurry vision. This started within the last month. Things are not as crisp. No pressure. She is also hearing low level, wooshing in the ears. Worse when she is laying down and then stands. No headaches, no pressure. No side effects from the diamox. She has not been able to lose weight. Will keep her on the current dose and then if weight loss of 10% will decreasing the diamox. Need to repeat MRi of the brain due to new worsneing vision and hearing issues given previously seen Abnormal signal within the medial temporal lobes bilaterally predominantly involving the hippocampal structures.  MRI 09/26/2015: FINDINGS:  The brain parenchyma shows no significant abnormalities except subtle prominence and thickening of the right hippocampus and medial temporal lobe which is less prominent compared to the previous scan and is of unclear significance. No other structural lesion, tumor infarcts are noted. Diffusion weighted imaging is negative for acute ischemia. Susceptibility weighted imaging is negative for microhemorrhages. Subarachnoid spaces and ventricular system appear normal. Extraction brain structures up and normal. Calvarium shows no abnormalities. The pituitary gland and cerebellar tonsils appear normal. Flow voids of the large vessels are intact and circulation appear to be patent. Orbits and paranasal sinuses appear normal. Visualized portions of the cervical spine appears normal. Contrast images do not result in any abnormal areas of enhancement.  IMPRESSION: Slightly abnormal MRI scan of the brain showing subtle prominence of the right medial temporal lobe which is of unclear significance. Overall no significant change compared with previous MRI dated 01/16/2015.    Interval update: Opening pressure of LP was 34.5. 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 the thumping and pressure in her head have improved. The peripheral vision changes are gone, no headaches. She needs follow up with ophthalmology. She needs an ophthalmologist to follow in the area, will refer to Dr. GKaty Fitch She denies any current side effects from the diamox.   HPI: AVeona Bittmanis a 27y.o. female here as a referral from Dr. AJeanie Cooksfor dizziness.  PMHx obesity. She was seen in the ED for acute onset headache and dizziness with decreased peripheral vision. She had an abnormal MRI and was called back to the ED for lumbar puncture which showed negative/normal CSF analysis. Unfortunately the LP did not include an opening pressure. She felt much better after the LP however. Currently if she stands up, she gets a thumping in the head. She gets stars in her right >Left peripheral vision. She gets pressure in the head all over. If she lays on her stomach, the pressure is worse and she can feel her heartbeat in her head. She went to optometry who diagnosed edema of the optic disks. Right eye has blurry vision which is persistent. She has had severe  headaches. The dizziness has resolved after the LP. Symptoms felt much better after LP. Denies seizure-like events, staring, altered awareness. Symptoms are daily. No other associated focal neurologic deficits. No neck pain.   Reviewed notes, labs and imaging from outside physicians, which showed: MRi brain 12/27/2014: Personally reviewed images: 1. Abnormal signal within the medial temporal lobes bilaterally predominantly involving the hippocampal structures. This is concerning for infectious process including herpes encephalitis. Other viral encephalitides can give a similar picture. Limbic encephalitis can look like this, but is typically  associated with lung cancer. Alternatively, this could be related to seizure activity. The patient has no history of seizures. 2. Question abnormal signal within the anterior aspect of the right optic nerve, potentially related to same process.   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 Widening of the optic nerve sheath. This can be observed with intracranial hypertension, and is commonly seen with pseudotumor cerebri. 2. The subtle signal changes in the mesial temporal lobes appear unchanged. Although initially worrisome for an infectious or limbic encephalopathy, the stability and the absence of specific clinical symptoms make this less likely. If clinically indicated, consider follow-up evaluation or follow-up CSF analysis  Labs:   CSF: protein, glucose, diff, cell count normal TSH, B12, B1, HIV wnl CRP, ESR elevated CMP unremarkable CBC w/ anemia   Review of Systems: Patient complains of symptoms per HPI as well as the following symptoms: Occasional headache, depression and nervous/anxious  Social History   Socioeconomic History  . Marital status: Married    Spouse name: Not on file  . Number of children: 0  . Years of education: Ba  . Highest education level: Not on file  Occupational History  . Occupation: Librarian, academic     Comment: [employer]  . Financial resource strain: Not on file  . Food insecurity:    Worry: Not on file    Inability: Not on file  . Transportation needs:    Medical: Not on file    Non-medical: Not on file  Tobacco Use  . Smoking status: Former Smoker    Types: Cigarettes, E-cigarettes     Last attempt to quit: 06/27/2009    Years since quitting: 8.8  . Smokeless tobacco: Never Used  Substance and Sexual Activity  . Alcohol use: Yes    Alcohol/week: 0.0 standard drinks    Comment: occasionally   . Drug use: No  . Sexual activity: Not Currently    Partners: Male    Birth control/protection: Condom, Pill  Lifestyle  . Physical activity:    Days per week: Not on file    Minutes per session: Not on file  . Stress: Not on file  Relationships  . Social connections:    Talks on phone: Not on file    Gets together: Not on file    Attends religious service: Not on file    Active member of club or organization: Not on file    Attends meetings of clubs or organizations: Not on file    Relationship status: Not on file  . Intimate partner violence:    Fear of current or ex partner: Not on file    Emotionally abused: Not on  file    Physically abused: Not on file    Forced sexual activity: Not on file  Other Topics Concern  . Not on file  Social History Narrative   Lives at home with husband, Lennette Bihari.   Caffeine use: 2-3 cups daily   Right handed    Family History  Problem Relation Age of Onset  . OCD Mother   . Diabetes Mother   . Depression Mother   . Anxiety disorder Mother   . Obesity Mother   . Obesity Father   . OCD Sister   . Depression Sister   . OCD Brother   . Anxiety disorder Brother     Past Medical History:  Diagnosis Date  . Anemia   . Anxiety   . Back pain   . Depression   . Fatty liver   . Gallbladder problem   . GERD (gastroesophageal reflux disease)   . Hypertension    Intercranial Hypertension  . IBS (irritable bowel syndrome)   . Joint pain   . Obsessive compulsive disorder 06/14  . Ostomy nurse consultation   . Pancreatitis   . Pancreatitis    hx of   . SOB (shortness of breath)   . UC (ulcerative colitis) [facility])     Past Surgical History:  Procedure Laterality Date  . CHOLECYSTECTOMY    . LAPAROSCOPIC PARTIAL PROTECTOMY      . PERMANENT ILEOSTOMY    . TOTAL COLECTOMY N/A     Current Outpatient Medications  Medication Sig Dispense Refill  . acetaZOLAMIDE (DIAMOX) 500 MG capsule TAKE 1 CAPSULE (500 MG TOTAL) BY MOUTH 3 (THREE) TIMES DAILY. 270 capsule 11  . fluvoxaMINE (LUVOX) 25 MG tablet Take 1 tablet (25 mg total) by mouth at bedtime. 30 tablet 2  . Incontinence Supplies (DRAINABLE FECAL COLLECTOR) MISC by Does not apply route.    Marland Kitchen LORazepam (ATIVAN) 0.5 MG tablet Take 1 tablet (0.5 mg total) by mouth daily as needed for anxiety. 15 tablet 0  . OLANZapine-FLUoxetine (SYMBYAX) 6-50 MG capsule Take 1 capsule by mouth at bedtime. 30 capsule 0  . Ostomy Supplies MISC by Does not apply route.    . SPRINTEC 28 0.25-35 MG-MCG tablet Take 1 tablet by mouth daily.  4   No current facility-administered medications for this visit.     Allergies as of 05/09/2018 - Review Complete 05/02/2018  Allergen Reaction Noted  . Nsaids Other (See Comments) 07/17/2014  . Tolmetin  10/14/2014    Vitals: There were no vitals taken for this visit. Last Weight:  Wt Readings from Last 1 Encounters:  10/24/17 227 lb (103 kg)   Last Height:   Ht Readings from Last 1 Encounters:  10/24/17 5' 2"  (1.575 m)   Physical exam: Exam: Gen: NAD, conversant, pleasant obese young Caucasian female, well groomed                     Eyes: Conjunctivae clear without exudates or hemorrhage  Neuro: Detailed Neurologic Exam  Speech:    Speech is normal; fluent and spontaneous with normal comprehension.  Cognition:    The patient is oriented to person, place, and time;   Cranial Nerves:    The pupils are equal, round, and reactive to light. The fundi are stable, mild nasal blurring. Visual fields are full to finger confrontation. Extraocular movements are intact. Trigeminal sensation is intact and the muscles of mastication are normal. The face is symmetric. The palate elevates in the midline. Hearing intact.  Voice is normal. Shoulder  shrug is normal. The tongue has normal motion without fasciculations.   Motor Observation:    No asymmetry, no atrophy, and no involuntary movements noted. Tone:    Normal muscle tone.    Posture:    Posture is normal. normal erect    Strength:    Strength is V/V in the upper and lower limbs.       Assessment/Plan: 27year old female with papilledema, dizziness, positional headache, vision and hearing changes, obesity who is being seen in this office for pseudotumor cerebri. Repeat MRi showed subtle signal changes in the mesial temporal lobes unchanged, although initially worrisome for an infectious or limbic encephalopathy, the stability and the absence of specific clinical symptoms make this less likely.  She continues to be stable with continuation of Diamox without any new or worsening symptoms.  Continue Diamox 500 mg 3 times daily Undergo bariatric surgery in spring 2020 to assist with obesity and weight loss Continue to follow with [name] as scheduled Advised her that if she develops symptoms or experiences worsening in vision, to proceed to ED for further evaluation   Follow-up in 6 months with Dr. Jaynee Eagles as bariatric procedure will be completed by this time and they can discuss future management and continuation of Diamox.  Advised patient to call office earlier with questions, concerns or need of sooner follow-up appointment.   Venancio Poisson, AGNP-BC  [facility] [address] [place], [place] [address]  Phone [phone] Fax [phone] Note: This document was prepared with digital dictation and possible smart phrase technology. Any transcriptional errors that result from this process are unintentional.

## 2018-05-09 NOTE — Patient Instructions (Addendum)
Your Plan:  Continue Diamox  Under bariatric surgery in the spring of next year  If you develop symptoms or vision worsens, please proceed to ED  Continue to follow with [name]   Follow up in 6 months with Dr. Jaynee Eagles     Thank you for coming to see [place] at [facility]. I hope we have been able to provide you high quality care today.  You may receive a patient satisfaction survey over the next few weeks. We would appreciate your feedback and comments so that we may continue to improve ourselves and the health of our patients.

## 2018-05-09 NOTE — Progress Notes (Signed)
Personally  participated in, made any corrections needed, and agree with history, physical, neuro exam,assessment and plan as stated above.     , MD [name] 

## 2018-05-12 ENCOUNTER — Emergency Department (HOSPITAL_COMMUNITY)
Admission: EM | Admit: 2018-05-12 | Discharge: 2018-05-13 | Disposition: A | Discharge status: Home / Self Care | Payer: Managed Care, Other (non HMO) | Attending: Emergency Medicine | Admitting: Emergency Medicine

## 2018-05-12 ENCOUNTER — Emergency Department (HOSPITAL_COMMUNITY): Payer: Managed Care, Other (non HMO)

## 2018-05-12 ENCOUNTER — Encounter (HOSPITAL_COMMUNITY): Payer: Self-pay | Admitting: Emergency Medicine

## 2018-05-12 DIAGNOSIS — R1031 Right lower quadrant pain: Secondary | ICD-10-CM | POA: Diagnosis present

## 2018-05-12 DIAGNOSIS — I10 Essential (primary) hypertension: Secondary | ICD-10-CM | POA: Diagnosis not present

## 2018-05-12 DIAGNOSIS — Z932 Ileostomy status: Secondary | ICD-10-CM | POA: Insufficient documentation

## 2018-05-12 DIAGNOSIS — Z87891 Personal history of nicotine dependence: Secondary | ICD-10-CM | POA: Diagnosis not present

## 2018-05-12 DIAGNOSIS — R112 Nausea with vomiting, unspecified: Secondary | ICD-10-CM

## 2018-05-12 LAB — CBC
HCT: 39.4 % (ref 36.0–46.0)
Hemoglobin: 12.2 g/dL (ref 12.0–15.0)
MCH: 26.3 pg (ref 26.0–34.0)
MCHC: 31 g/dL (ref 30.0–36.0)
MCV: 85.1 fL (ref 80.0–100.0)
Platelets: 272 10*3/uL (ref 150–400)
RBC: 4.63 MIL/uL (ref 3.87–5.11)
RDW: 13.7 % (ref 11.5–15.5)
WBC: 11.9 10*3/uL — ABNORMAL HIGH (ref 4.0–10.5)
nRBC: 0 % (ref 0.0–0.2)

## 2018-05-12 LAB — URINALYSIS, ROUTINE W REFLEX MICROSCOPIC
Bacteria, UA: NONE SEEN
Bilirubin Urine: NEGATIVE
Glucose, UA: NEGATIVE mg/dL
Hgb urine dipstick: NEGATIVE
Ketones, ur: NEGATIVE mg/dL
Leukocytes, UA: NEGATIVE
Nitrite: NEGATIVE
Protein, ur: NEGATIVE mg/dL
Specific Gravity, Urine: 1.008 (ref 1.005–1.030)
pH: 7 (ref 5.0–8.0)

## 2018-05-12 LAB — COMPREHENSIVE METABOLIC PANEL
ALT: 47 U/L — ABNORMAL HIGH (ref 0–44)
AST: 41 U/L (ref 15–41)
Albumin: 3.7 g/dL (ref 3.5–5.0)
Alkaline Phosphatase: 136 U/L — ABNORMAL HIGH (ref 38–126)
Anion gap: 10 (ref 5–15)
BUN: 9 mg/dL (ref 6–20)
CO2: 18 mmol/L — ABNORMAL LOW (ref 22–32)
Calcium: 8.7 mg/dL — ABNORMAL LOW (ref 8.9–10.3)
Chloride: 106 mmol/L (ref 98–111)
Creatinine, Ser: 0.94 mg/dL (ref 0.44–1.00)
GFR calc Af Amer: >60 mL/min (ref >60)
GFR calc non Af Amer: >60 mL/min (ref >60)
Glucose, Bld: 118 mg/dL — ABNORMAL HIGH (ref 70–99)
Potassium: 3.4 mmol/L — ABNORMAL LOW (ref 3.5–5.1)
Sodium: 134 mmol/L — ABNORMAL LOW (ref 135–145)
Total Bilirubin: 0.2 mg/dL — ABNORMAL LOW (ref 0.3–1.2)
Total Protein: 7.7 g/dL (ref 6.5–8.1)

## 2018-05-12 LAB — I-STAT BETA HCG BLOOD, ED (MC, WL, AP ONLY): I-stat hCG, quantitative: <5 m[IU]/mL (ref <5)

## 2018-05-12 LAB — LIPASE, BLOOD: Lipase: 36 U/L (ref 11–51)

## 2018-05-12 MED ORDER — ONDANSETRON HCL 4 MG/2ML IJ SOLN
4.0000 mg | Freq: Once | INTRAMUSCULAR | Status: AC
  Administered 2018-05-12: 4 mg via INTRAVENOUS
  Filled 2018-05-12: qty 2

## 2018-05-12 MED ORDER — SODIUM CHLORIDE 0.9 % IV BOLUS
1000.0000 mL | Freq: Once | INTRAVENOUS | Status: AC
  Administered 2018-05-12: 1000 mL via INTRAVENOUS

## 2018-05-12 MED ORDER — IOHEXOL 300 MG/ML  SOLN
100.0000 mL | Freq: Once | INTRAMUSCULAR | Status: AC | PRN
  Administered 2018-05-12: 100 mL via INTRAVENOUS

## 2018-05-12 MED ORDER — FENTANYL CITRATE (PF) 100 MCG/2ML IJ SOLN
50.0000 ug | INTRAMUSCULAR | Status: DC | PRN
  Administered 2018-05-12: 50 ug via INTRAVENOUS
  Filled 2018-05-12: qty 2

## 2018-05-12 NOTE — ED Triage Notes (Signed)
Pt complains of abdominal pain that goes from the right of her stoma across her stomach.  Pt emesis X5 today.  Pt reports bag filled w/ fluid 5X w/ fluid.

## 2018-05-12 NOTE — ED Provider Notes (Signed)
Emergency Department Provider Note   I have reviewed the triage vital signs and the nursing notes.   HISTORY  Chief Complaint Abdominal Pain and Emesis   HPI Diana Page is a 27 y.o. female with PMH of GERD, HTN, and severe UC s/p proctocolectomy with ileostomy followed currently by Dr. Benson Norway presents to the emergency department with worsening abdominal pain, nausea, vomiting.  Patient has had 5 episodes of emesis today.  She continues to have output into her ileostomy bag.  She estimates filling the bag approximately 5 times with fluid which is an increase from her normal.  Her vomiting was without any obvious blood.  She denies fevers or chills.  The pain is primarily right-sided and radiates to the flank.  She describes it as a sharp and severe.  No modifying factors.   Past Medical History:  Diagnosis Date  . Anemia   . Anxiety   . Back pain   . Depression   . Fatty liver   . Gallbladder problem   . GERD (gastroesophageal reflux disease)   . Hypertension    Intercranial Hypertension  . IBS (irritable bowel syndrome)   . Joint pain   . Obsessive compulsive disorder 06/14  . Ostomy nurse consultation   . Pancreatitis   . Pancreatitis    hx of   . SOB (shortness of breath)   . UC (ulcerative colitis) [facility])     Patient Active Problem List   Diagnosis Date Noted  . Obesity 03/21/2016  . IIH (idiopathic intracranial hypertension) 04/02/2015  . 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 Headache 01/04/2015  . Intracranial hypertension 01/04/2015  . Dizziness 12/28/2014  . Obsessive compulsive disorder   . Ulcerative colitis ([facility]) 07/18/2014    Past Surgical History:  Procedure Laterality Date  . CHOLECYSTECTOMY    . LAPAROSCOPIC PARTIAL PROTECTOMY    . PERMANENT ILEOSTOMY    . TOTAL COLECTOMY N/A    Allergies Nsaids and Tolmetin  Family History  Problem Relation Age of Onset  . OCD Mother   . Diabetes Mother   . Depression Mother     . Anxiety disorder Mother   . Obesity Mother   . Obesity Father   . OCD Sister   . Depression Sister   . OCD Brother   . Anxiety disorder Brother     Social History Social History   Tobacco Use  . Smoking status: Former Smoker    Types: Cigarettes, E-cigarettes    Last attempt to quit: 06/27/2009    Years since quitting: 8.8  . Smokeless tobacco: Never Used  Substance Use Topics  . Alcohol use: Yes    Alcohol/week: 0.0 standard drinks    Comment: occasionally   . Drug use: No    Review of Systems  Constitutional: No fever/chills Eyes: No visual changes. ENT: No sore throat. Cardiovascular: Denies chest pain. Respiratory: Denies shortness of breath. Gastrointestinal: Positive right sided abdominal pain. Positive nausea and vomiting. Increased ostomy output.  Genitourinary: Negative for dysuria. Musculoskeletal: Negative for back pain. Skin: Negative for rash. Neurological: Negative for headaches, focal weakness or numbness.  10-point ROS otherwise negative.  ____________________________________________   PHYSICAL EXAM:  VITAL SIGNS: ED Triage Vitals  Enc Vitals Group     BP 05/12/18 2122 129/69     Pulse Rate 05/12/18 2122 97     Resp 05/12/18 2122 15     Temp 05/12/18 2122 98 F (36.7 C)     Temp Source 05/12/18  2122 Oral     SpO2 05/12/18 2122 98 %     Weight 05/12/18 2123 242 lb (109.8 kg)     Height 05/12/18 2123 5' 2"  (1.575 m)     Pain Score 05/12/18 2123 7   Constitutional: Alert and oriented. Well appearing and in no acute distress. Eyes: Conjunctivae are normal.  Head: Atraumatic. Nose: No congestion/rhinnorhea. Mouth/Throat: Mucous membranes are moist.  Oropharynx non-erythematous. Neck: No stridor.  Cardiovascular: Normal rate, regular rhythm. Good peripheral circulation. Grossly normal heart sounds.   Respiratory: Normal respiratory effort. No retractions. Lungs CTAB. Gastrointestinal: Soft with focal tenderness lateral to the ostomy and  extending to the right flank. No rebound or guarding. No upper or left sided abdominal tenderness. Output noted in ostomy bag without gross blood or melena.  Musculoskeletal: No lower extremity tenderness nor edema. No gross deformities of extremities. Neurologic:  Normal speech and language. No gross focal neurologic deficits are appreciated.  Skin:  Skin is warm, dry and intact. No rash noted.  ____________________________________________   LABS (all labs ordered are listed, but only abnormal results are displayed)  Labs Reviewed  COMPREHENSIVE METABOLIC PANEL - Abnormal; Notable for the following components:      Result Value   Sodium 134 (*)    Potassium 3.4 (*)    CO2 18 (*)    Glucose, Bld 118 (*)    Calcium 8.7 (*)    ALT 47 (*)    Alkaline Phosphatase 136 (*)    Total Bilirubin 0.2 (*)    All other components within normal limits  CBC - Abnormal; Notable for the following components:   WBC 11.9 (*)    All other components within normal limits  LIPASE, BLOOD  URINALYSIS, ROUTINE W REFLEX MICROSCOPIC  I-STAT BETA HCG BLOOD, ED ([facility], [facility], [facility] ONLY)  I-STAT CG4 LACTIC ACID, ED   ____________________________________________  RADIOLOGY  Ct Abdomen Pelvis W Contrast  Result Date: 05/13/2018 CLINICAL DATA:  Right-sided abdominal pain. Emesis x5 today. EXAM: CT ABDOMEN AND PELVIS WITH CONTRAST TECHNIQUE: Multidetector CT imaging of the abdomen and pelvis was performed using the standard protocol following bolus administration of intravenous contrast. CONTRAST:  125 mL OMNIPAQUE IOHEXOL 300 MG/ML  SOLN COMPARISON:  None. FINDINGS: Lower chest: No acute abnormality. Hepatobiliary: Diffuse low attenuation of the liver as can be seen with hepatic steatosis. No focal liver abnormality is seen. Status post cholecystectomy. No biliary dilatation. Pancreas: Unremarkable. No pancreatic ductal dilatation or surrounding inflammatory changes. Spleen: Normal in size without focal abnormality.  Adrenals/Urinary Tract: Adrenal glands are unremarkable. Kidneys are normal, without renal calculi, focal lesion, or hydronephrosis. Bladder is unremarkable. Stomach/Bowel: Stomach is within normal limits. No evidence of bowel wall thickening, distention, or inflammatory changes. Total colectomy. Right mid abdominal ileostomy. Vascular/Lymphatic: No significant vascular findings are present. No enlarged abdominal or pelvic lymph nodes. Reproductive: Uterus and bilateral adnexa are unremarkable. Other: No abdominal wall hernia or abnormality. No abdominopelvic ascites. Musculoskeletal: No acute osseous abnormality. No aggressive osseous lesion. IMPRESSION: 1. No acute abdominal or pelvic pathology. 2. Hepatic steatosis. 3. Right mid abdominal ileostomy. Electronically Signed   By: Kathreen Devoid   On: 05/13/2018 00:05    ____________________________________________   PROCEDURES  Procedure(s) performed:   Procedures  None ____________________________________________   INITIAL IMPRESSION / ASSESSMENT AND PLAN / ED COURSE  Pertinent labs & imaging results that were available during my care of the patient were reviewed by me and considered in my medical decision making (see chart for details).  Patient presents to the emergency department for evaluation of abdominal pain with vomiting.  She has had proctocolectomy with ileostomy 3 years ago with Dr. Audie Clear at [facility] but is currently followed by Dr. Benson Norway locally.  Normal vitals.  Patient is well-appearing.  She does have focal tenderness to the right of the ostomy.  No palpable hernia.  Patient is having increased output into her ostomy as well as vomiting. Question partial SBO. Plan for CT abdomen pelvis with IV and PO contrast.   Labs unremarkable. CT abdomen/pelvis pending. Care transferred to Dr. Wyvonnia Dusky.   I reviewed all nursing notes, vitals, pertinent old records, EKGs, labs, imaging (as  available).  ____________________________________________  FINAL CLINICAL IMPRESSION(S) / ED DIAGNOSES  Final diagnoses:  Right lower quadrant abdominal pain  Non-intractable vomiting with nausea, unspecified vomiting type     MEDICATIONS GIVEN DURING THIS VISIT:  Medications  fentaNYL (SUBLIMAZE) injection 50 mcg (50 mcg Intravenous Given 05/12/18 2207)  sodium chloride 0.9 % bolus 1,000 mL (0 mLs Intravenous Stopped 05/12/18 2306)  ondansetron (ZOFRAN) injection 4 mg (4 mg Intravenous Given 05/12/18 2206)  iohexol (OMNIPAQUE) 300 MG/ML solution 100 mL (100 mLs Intravenous Contrast Given 05/12/18 2347)  morphine 4 MG/ML injection 4 mg (4 mg Intravenous Given 05/13/18 0022)    Note:  This document was prepared using Dragon voice recognition software and may include unintentional dictation errors.  Nanda Quinton, MD Emergency Medicine    Altin Sease, [facility], MD 05/13/18 [phone]

## 2018-05-13 LAB — I-STAT CG4 LACTIC ACID, ED: Lactic Acid, Venous: 1.76 mmol/L (ref 0.5–1.9)

## 2018-05-13 MED ORDER — HYDROCODONE-ACETAMINOPHEN 5-325 MG PO TABS: 1.0000 | ORAL_TABLET | ORAL | 0 refills | Status: DC | PRN

## 2018-05-13 MED ORDER — MORPHINE SULFATE (PF) 4 MG/ML IV SOLN
4.0000 mg | Freq: Once | INTRAVENOUS | Status: AC
  Administered 2018-05-13: 4 mg via INTRAVENOUS
  Filled 2018-05-13: qty 1

## 2018-05-13 MED ORDER — ONDANSETRON 4 MG PO TBDP: 4.0000 mg | ORAL_TABLET | Freq: Three times a day (TID) | ORAL | 0 refills | Status: DC | PRN

## 2018-05-13 NOTE — ED Provider Notes (Signed)
Care assumed from Dr. Laverta Baltimore.  Patient with history of severe ulcerative colitis with ileostomy presenting with 5 hours of right-sided abdominal pain with nausea and vomiting increased ostomy output.  CT scan pending.  CT scan shows no acute pathology.  No areas of active UC or bowel obstruction.  Abdomen soft without peritoneal signs.  Labs are reassuring. Lactate normal.  Patient able to tolerate p.o. Abdomen soft without peritoneal signs.  Patient comfortable with discharge and follow-up with Dr. Benson Norway.  Return precautions discussed. BP (!) 101/59 (BP Location: Right Arm)   Pulse 95   Temp 98 F (36.7 C) (Oral)   Resp 18   Ht 5' 2"  (1.575 m)   Wt 109.8 kg   SpO2 98%   BMI 44.26 kg/m     Diana Essex, MD 05/13/18 [id number]

## 2018-05-13 NOTE — Discharge Instructions (Signed)
Your lab work, urinalysis and CT scan all look stable.  Call Dr. Benson Norway for an appointment next week.  Return to the ED if you develop new or worsening symptoms.

## 2018-05-23 ENCOUNTER — Ambulatory Visit (HOSPITAL_COMMUNITY)
Admission: RE | Admit: 2018-05-23 | Discharge: 2018-05-23 | Disposition: A | Discharge status: Home / Self Care | Payer: Managed Care, Other (non HMO) | Source: Ambulatory Visit | Attending: Surgery | Admitting: Surgery

## 2018-05-28 ENCOUNTER — Other Ambulatory Visit (HOSPITAL_COMMUNITY): Payer: Self-pay | Admitting: Psychiatry

## 2018-05-28 DIAGNOSIS — F429 Obsessive-compulsive disorder, unspecified: Secondary | ICD-10-CM

## 2018-05-28 DIAGNOSIS — F411 Generalized anxiety disorder: Secondary | ICD-10-CM

## 2018-05-28 DIAGNOSIS — F330 Major depressive disorder, recurrent, mild: Secondary | ICD-10-CM

## 2018-06-04 ENCOUNTER — Encounter: Payer: Self-pay | Admitting: Dietician

## 2018-06-04 ENCOUNTER — Encounter: Payer: Managed Care, Other (non HMO) | Attending: Surgery | Admitting: Dietician

## 2018-06-04 VITALS — Ht 62.5 in | Wt 239.1 lb

## 2018-06-04 DIAGNOSIS — E669 Obesity, unspecified: Secondary | ICD-10-CM

## 2018-06-04 NOTE — Progress Notes (Signed)
Bariatric Pre-Op Nutrition Assessment for Planned Sleeve Surgery Medical Nutrition Therapy  Appt Start Time: 2:15pm  End time: 3:15pm  Patient was seen on 06/04/2018 for Pre-Operative Nutrition Assessment. Assessment and letter of approval faxed to [facility] coordinator on 06/04/2018.   Pt expectation of surgery: To help lose weight and maintain a healthy weight.  Pt expectation of dietitian: None stated.   Anthropometrics  Start weight at [facility]: 239 lbs Height: 62.5 in BMI: 43 kg/m2    Clinical  Medical Hx: obesity, ulcerative colitis, pancreatitis, IBS, GERD, intercranial hypertension, depression, anxiety  Surgeries: cholecystectomy, colectomy, ileostomy, partial proctectomy  Allergies: NSAIDS Medications: Symbyax, diamox, luvox    Psychosocial/Lifestyle Pt lives with her husband and 2 dogs, previously lived in [place]. 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   24-Hr Dietary Recall First Meal: roll  Snack: none Second Meal: none Snack: none Third Meal: hot dog + bun  Snack: none  Beverages: milk + Powerade Zero + water + diet soda   Food & Nutrition Related Hx Dietary Hx: Pt states she often does not eat much throughout the day and will eat one large meal in the evening. Pt states her irregular food intake is often a reflection of her depression. Pt states she has always been overweight but then became ill several years ago with ulcerative colitis resulting in weight loss. Pt states after gradually losing weight for about 7 years (lowest weight ~130 lbs,) she was able to reintroduce most foods again resulting in overeating and regaining weight. Pt states her electrolytes are often imbalanced so she drinks Powerade Zeros. Pt states she drinks lots of diet soda but would like to eliminate these from her diet.   Estimated Daily Fluid Intake: 50-60 oz (32 oz Powerade Zero + 1 cup water + diet soda)    Physical Activity  Current average weekly physical activity: none  Estimated Energy Needs Calories: 2000 Carbohydrate: 250g Protein: 150g Fat: 45g  Pre-Op Goals Reviewed with the Patient . Track food and beverage intake (try MyFitness Pal or the Baritastic app) . Make healthy food choices . Avoid concentrated sugars and fried foods . Keep fat & sugar in the single digits per serving on food labels . Practice CHEWING your food (aim for applesauce consistency) . Practice not drinking 15 minutes before, during, and 30 minutes after each meal and snack . Avoid all carbonated beverages (ex: soda, sparkling beverages)  . Limit caffeinated beverages (ex: coffee, tea, energy drinks) . Avoid all sugar-sweetened beverages (ex: regular soda, sports drinks)  . Avoid alcohol  . Consume 3 meals per day or try to eat every 3-5 hours . Make a list of non-food related activities . Aim for 64-100 ounces of FLUID daily (with at least half of fluid intake being plain water)  . Aim for at least 60-80 grams of PROTEIN daily . Look for a liquid protein source that contains ?15 g protein and ?5 g carbohydrate (ex: shakes, drinks, shots) . Physical activity is an important part of a healthy lifestyle so keep it moving! The goal is to reach 150 minutes of exercise per week, including cardiovascular and weight baring activity.  *Goals that are bolded indicate the pt would like to start working towards these  Handouts Provided Include  . Pre-Op Goals . Bariatric Surgery Vitamins & Minerals . Bariatric Surgery Protein Shakes  Learning Style & Readiness for Change Teaching method utilized: Visual &  Auditory  Demonstrated degree of understanding via: Teach Back  Barriers to learning/adherence to lifestyle change: Depression seems to influence food intake.   RD's Notes for Next SWL Visit:   Discuss healthy eating, demonstrating with MyPlate and meal/snack examples.   Address sodium and fluid  restrictions/balance.   Assess progress made on goals previously set and if taking vitamin D/multivitamin.   Next Steps Supervised Weight Loss (SWL) Visits Needed: 3  Patient is to call [facility] to enroll in Pre-Op Class (>2 weeks before surgery) and Post-Op Class (2 weeks after surgery) for further nutrition education when surgery date is scheduled.

## 2018-06-04 NOTE — Patient Instructions (Addendum)
   Make healthy food choices . Avoid all carbonated beverages (ex: soda, sparkling beverages)  . Consume 3 meals per day or try to eat every 3-5 hours  Physical activity-  Start incorporating this by walking dogs daily, aiming for 20-30 minutes per day.   Look into getting a vitamin D supplement and continue taking multivitamin.

## 2018-06-05 ENCOUNTER — Other Ambulatory Visit (HOSPITAL_COMMUNITY): Payer: Self-pay | Admitting: Psychiatry

## 2018-06-05 DIAGNOSIS — F330 Major depressive disorder, recurrent, mild: Secondary | ICD-10-CM

## 2018-06-05 DIAGNOSIS — F429 Obsessive-compulsive disorder, unspecified: Secondary | ICD-10-CM

## 2018-06-14 ENCOUNTER — Other Ambulatory Visit (HOSPITAL_COMMUNITY): Payer: Self-pay

## 2018-06-14 DIAGNOSIS — F429 Obsessive-compulsive disorder, unspecified: Secondary | ICD-10-CM

## 2018-06-14 DIAGNOSIS — F330 Major depressive disorder, recurrent, mild: Secondary | ICD-10-CM

## 2018-06-14 MED ORDER — OLANZAPINE-FLUOXETINE HCL 6-50 MG PO CAPS: 1.0000 | ORAL_CAPSULE | Freq: Every day | ORAL | 0 refills | Status: DC

## 2018-06-26 ENCOUNTER — Encounter (HOSPITAL_COMMUNITY): Payer: Self-pay | Admitting: *Deleted

## 2018-06-26 ENCOUNTER — Emergency Department (HOSPITAL_COMMUNITY)
Admission: EM | Admit: 2018-06-26 | Discharge: 2018-06-26 | Disposition: A | Discharge status: Home / Self Care | Payer: Managed Care, Other (non HMO) | Attending: Emergency Medicine | Admitting: Emergency Medicine

## 2018-06-26 DIAGNOSIS — Z87891 Personal history of nicotine dependence: Secondary | ICD-10-CM | POA: Insufficient documentation

## 2018-06-26 DIAGNOSIS — Z79899 Other long term (current) drug therapy: Secondary | ICD-10-CM | POA: Diagnosis not present

## 2018-06-26 DIAGNOSIS — R1031 Right lower quadrant pain: Secondary | ICD-10-CM | POA: Diagnosis not present

## 2018-06-26 DIAGNOSIS — R7989 Other specified abnormal findings of blood chemistry: Secondary | ICD-10-CM

## 2018-06-26 DIAGNOSIS — R944 Abnormal results of kidney function studies: Secondary | ICD-10-CM | POA: Insufficient documentation

## 2018-06-26 DIAGNOSIS — Z933 Colostomy status: Secondary | ICD-10-CM | POA: Diagnosis not present

## 2018-06-26 DIAGNOSIS — R112 Nausea with vomiting, unspecified: Secondary | ICD-10-CM | POA: Diagnosis not present

## 2018-06-26 DIAGNOSIS — R109 Unspecified abdominal pain: Secondary | ICD-10-CM

## 2018-06-26 LAB — URINALYSIS, ROUTINE W REFLEX MICROSCOPIC
Glucose, UA: NEGATIVE mg/dL
Ketones, ur: NEGATIVE mg/dL
Leukocytes, UA: NEGATIVE
Nitrite: NEGATIVE
Protein, ur: 100 mg/dL — AB
Specific Gravity, Urine: 1.027 (ref 1.005–1.030)
pH: 5 (ref 5.0–8.0)

## 2018-06-26 LAB — COMPREHENSIVE METABOLIC PANEL
ALT: 46 U/L — ABNORMAL HIGH (ref 0–44)
AST: 38 U/L (ref 15–41)
Albumin: 4.6 g/dL (ref 3.5–5.0)
Alkaline Phosphatase: 151 U/L — ABNORMAL HIGH (ref 38–126)
Anion gap: 15 (ref 5–15)
BUN: 14 mg/dL (ref 6–20)
CO2: 14 mmol/L — ABNORMAL LOW (ref 22–32)
Calcium: 9.3 mg/dL (ref 8.9–10.3)
Chloride: 107 mmol/L (ref 98–111)
Creatinine, Ser: 1.25 mg/dL — ABNORMAL HIGH (ref 0.44–1.00)
GFR calc Af Amer: >60 mL/min (ref >60)
GFR calc non Af Amer: 58 mL/min — ABNORMAL LOW (ref >60)
Glucose, Bld: 118 mg/dL — ABNORMAL HIGH (ref 70–99)
Potassium: 3.8 mmol/L (ref 3.5–5.1)
Sodium: 136 mmol/L (ref 135–145)
Total Bilirubin: 0.3 mg/dL (ref 0.3–1.2)
Total Protein: 8.8 g/dL — ABNORMAL HIGH (ref 6.5–8.1)

## 2018-06-26 LAB — CBC
HCT: 49.4 % — ABNORMAL HIGH (ref 36.0–46.0)
Hemoglobin: 15.5 g/dL — ABNORMAL HIGH (ref 12.0–15.0)
MCH: 27.1 pg (ref 26.0–34.0)
MCHC: 31.4 g/dL (ref 30.0–36.0)
MCV: 86.5 fL (ref 80.0–100.0)
Platelets: 384 10*3/uL (ref 150–400)
RBC: 5.71 MIL/uL — ABNORMAL HIGH (ref 3.87–5.11)
RDW: 13.9 % (ref 11.5–15.5)
WBC: 9.2 10*3/uL (ref 4.0–10.5)
nRBC: 0 % (ref 0.0–0.2)

## 2018-06-26 LAB — I-STAT BETA HCG BLOOD, ED (MC, WL, AP ONLY): I-stat hCG, quantitative: <5 m[IU]/mL (ref <5)

## 2018-06-26 LAB — LIPASE, BLOOD: Lipase: 27 U/L (ref 11–51)

## 2018-06-26 MED ORDER — PROMETHAZINE HCL 25 MG PO TABS: 25.0000 mg | ORAL_TABLET | Freq: Four times a day (QID) | ORAL | 0 refills | Status: DC | PRN

## 2018-06-26 MED ORDER — PROMETHAZINE HCL 25 MG PO TABS
12.5000 mg | ORAL_TABLET | Freq: Once | ORAL | Status: DC
  Filled 2018-06-26: qty 1

## 2018-06-26 MED ORDER — ONDANSETRON HCL 4 MG/2ML IJ SOLN
4.0000 mg | Freq: Once | INTRAMUSCULAR | Status: AC
  Administered 2018-06-26: 4 mg via INTRAVENOUS
  Filled 2018-06-26: qty 2

## 2018-06-26 MED ORDER — SODIUM CHLORIDE 0.9 % IV BOLUS
1000.0000 mL | Freq: Once | INTRAVENOUS | Status: AC
  Administered 2018-06-26: 1000 mL via INTRAVENOUS

## 2018-06-26 MED ORDER — MORPHINE SULFATE (PF) 4 MG/ML IV SOLN
4.0000 mg | Freq: Once | INTRAVENOUS | Status: AC
  Administered 2018-06-26: 4 mg via INTRAVENOUS
  Filled 2018-06-26: qty 1

## 2018-06-26 MED ORDER — FENTANYL CITRATE (PF) 100 MCG/2ML IJ SOLN
50.0000 ug | Freq: Once | INTRAMUSCULAR | Status: AC
  Administered 2018-06-26: 50 ug via INTRAVENOUS
  Filled 2018-06-26: qty 2

## 2018-06-26 MED ORDER — HYDROCODONE-ACETAMINOPHEN 5-325 MG PO TABS
1.0000 | ORAL_TABLET | Freq: Once | ORAL | Status: DC
  Filled 2018-06-26: qty 1

## 2018-06-26 MED ORDER — PROMETHAZINE HCL 25 MG/ML IJ SOLN
12.5000 mg | Freq: Once | INTRAMUSCULAR | Status: AC
  Administered 2018-06-26: 12.5 mg via INTRAVENOUS
  Filled 2018-06-26: qty 1

## 2018-06-26 MED ORDER — HYDROCODONE-ACETAMINOPHEN 5-325 MG PO TABS: 2.0000 | ORAL_TABLET | ORAL | 0 refills | Status: DC | PRN

## 2018-06-26 NOTE — ED Triage Notes (Signed)
Pt in c/o right sided abdominal pain that started in November, she was dx with a cyst related to that pain, pt over christmas developed n/v and her ileostomy bag is filling up constantly

## 2018-06-26 NOTE — ED Provider Notes (Signed)
Care transferred from my attending Dr. Tamera Punt at shift change. See note for full HPI.  In summation, 27 year old female with past medical history significant for severe ulcerative colitis status post colostomy placement who presents for evaluation nausea, vomiting and increased output from her colostomy bag.  Patient has had 2-3-day history of nausea and vomiting which is nonbloody and nonbilious in nature.  Patient states has had increase in watery output from her colostomy bag.  Patient has had similar symptoms in the past.  Patient was seen in [place] on December 25 with similar symptoms.  Patient received CT scan at that time with negative findings.  Patient states she was told to follow-up with her GI doctor when she returned to [place].  She did have another CT scan which showed right ovarian cyst.  She does have follow-up appointment with OB/GYN on Monday for this.  Denies fever, chills, chest pain, shortness of breath, abdominal pain, dysuria, vaginal discharge.  Patient states her symptoms feel similar to her previous episodes.  At shift change patient with urinalysis negative for infection, metabolic panel with mild elevation in glucose at 118, CO2 14, most likely dehydration, mild elevation in LFTs, however this is baseline in nature compared to previous labs, i-STAT negative.   Given patient has had these symptoms and has had negative CT scan previously previous provider did not feel patient needed repeat CT scan at this time.  She was given fluids as well as p.o. trial.  She was given pain medicine patient most likely DC home on reevaluation.  Physical Exam  BP 101/66 (BP Location: Right Arm)   Pulse (!) 88   Temp 97.7 F (36.5 C) (Oral)   Resp 18   SpO2 98%   Physical Exam Vitals signs and nursing note reviewed.  Constitutional:      General: She is not in acute distress.    Appearance: She is well-developed. She is not ill-appearing, toxic-appearing or diaphoretic.  HENT:   Head: Normocephalic and atraumatic.     Mouth/Throat:     Mouth: Mucous membranes are moist.  Eyes:     Pupils: Pupils are equal, round, and reactive to light.  Neck:     Musculoskeletal: Normal range of motion.  Cardiovascular:     Rate and Rhythm: Normal rate.     Pulses: Normal pulses.     Heart sounds: Normal heart sounds. No murmur. No friction rub. No gallop.   Pulmonary:     Effort: Pulmonary effort is normal. No respiratory distress.     Breath sounds: Normal breath sounds. No stridor. No wheezing, rhonchi or rales.     Comments: Lungs clear to ascultation bilaterally. Abdominal:     General: Bowel sounds are normal. There is no distension.     Palpations: Abdomen is soft. There is no shifting dullness, fluid wave, hepatomegaly, splenomegaly or mass.     Tenderness: There is no abdominal tenderness. There is no right CVA tenderness, left CVA tenderness, guarding or rebound. Negative signs include Murphy's sign, Rovsing's sign, McBurney's sign, psoas sign and obturator sign.     Hernia: No hernia is present.     Comments: Ostomy bag to right abdomen. Soft, non-tender without rebound or guarding. No bloody output to ostomy.  Musculoskeletal: Normal range of motion.  Skin:    General: Skin is warm and dry.  Neurological:     Mental Status: She is alert.     ED Course/Procedures     Procedures Labs Reviewed  COMPREHENSIVE METABOLIC PANEL - Abnormal; Notable for the following components:      Result Value   CO2 14 (*)    Glucose, Bld 118 (*)    Creatinine, Ser 1.25 (*)    Total Protein 8.8 (*)    ALT 46 (*)    Alkaline Phosphatase 151 (*)    GFR calc non Af Amer 58 (*)    All other components within normal limits  CBC - Abnormal; Notable for the following components:   RBC 5.71 (*)    Hemoglobin 15.5 (*)    HCT 49.4 (*)    All other components within normal limits  URINALYSIS, ROUTINE W REFLEX MICROSCOPIC - Abnormal; Notable for the following components:   Color,  Urine AMBER (*)    APPearance CLOUDY (*)    Hgb urine dipstick MODERATE (*)    Bilirubin Urine SMALL (*)    Protein, ur 100 (*)    Bacteria, UA MANY (*)    All other components within normal limits  URINE CULTURE  LIPASE, BLOOD  I-STAT BETA HCG BLOOD, ED ([facility], [facility], [facility] ONLY)   MDM  27 year old how appears otherwise well presents for evaluation of N/V and increase output from ostomy. Was seen in [place] ED on 12/25 with similar symptoms.  Patient had CT scan done at that time which did show right ovarian cyst, however did not show any abdominal pathology at that time.  Of note, patient has also been seen previously in this emergency department with similar symptoms with negative CT scan at that time.  Patient did have tachycardia on arrival, heart rate improved with fluids.  Rate at 94 at discharge.  She has a benign abdominal exam.  She does have mildly elevated creatinine which is most likely related to dehydration.  Patient was supposed to follow-up with GI when she returned, however has not followed up.  Labs reassuring.  Given patient had negative CT scan with similar symptoms recently, previous provider did fell necessary to order CT scan.  Urinalysis consistent with dirty specimen, will culture urine.  She denies any urinary symptoms.  She does have hematuria, however this is likely due to that patient is currently on her menstrual cycle.  Patient was given fluids, antiemetics and pain medication.  On my initial evaluation patient abdomen soft, nontender without rebound or guarding.  Patient has been able to tolerate p.o. liquids as well as crackers without difficulty.  Patient with concerns that emesis and pain will return when she is discharged home and is requesting additional dose of antiemetics and pain medicine prior to DC home.  Will give additional dose prior to DC home.   Patient is nontoxic, nonseptic appearing, in no apparent distress.  Patient's pain and other symptoms adequately managed in  emergency department.  Fluid bolus given.  Labs, imaging and vitals reviewed.  Patient does not meet the SIRS or Sepsis criteria.  On repeat exam patient does not have a surgical abdomin and there are no peritoneal signs.  No indication of appendicitis, bowel obstruction, bowel perforation, cholecystitis, diverticulitis, PID or ectopic pregnancy.  Patient discharged home with symptomatic treatment and given strict instructions for follow-up with their primary care physician and GI.  I have also discussed reasons to return immediately to the ER.  Patient expresses understanding and agrees with plan.     Jasai Sorg A, PA-C 06/26/18 2247    Malvin Johns, MD 06/27/18 1103

## 2018-06-26 NOTE — Discharge Instructions (Signed)
Stay on clear liquids for the next 24 hours.  Make appointment to follow-up with your gastroenterologist.  Return here as needed for any worsening pain, vomiting, fevers or other worsening symptoms.  Your creatinine (kidney function) was elevated and needs to be rechecked by your doctor.

## 2018-06-26 NOTE — ED Provider Notes (Addendum)
[place] EMERGENCY DEPARTMENT Provider Note   CSN: [id number] Arrival date & time: 06/26/18  1356     History   Chief Complaint Chief Complaint  Patient presents with  . Abdominal Pain    HPI Diana Page is a 27 y.o. female.  Patient is a 27 year old female with severe ulcerative colitis status post colostomy placement who presents with nausea vomiting and increased output from her colostomy bag.  She reports a 2-day history of nausea and vomiting which is nonbilious and nonbloody with some increased watery, bilious output from her colostomy.  She denies any fevers.  No urinary symptoms.  She has had similar symptoms twice in the recent past associated with some right-sided flank pain.  She was seen in the ER in November and had a CT scan which did not show any acute abnormality.  She also was seen December 25 at emergency department in [place] with similar symptoms of right side pain associated with increased output from her colostomy bag and vomiting.  She had another CT scan at that point which showed a right ovarian cyst.  She has an upcoming appointment with an OB/GYN on Monday.  She presents today with similar symptoms.     Past Medical History:  Diagnosis Date  . Anemia   . Anxiety   . Back pain   . Depression   . Fatty liver   . Gallbladder problem   . GERD (gastroesophageal reflux disease)   . Hypertension    Intercranial Hypertension  . IBS (irritable bowel syndrome)   . Joint pain   . Obsessive compulsive disorder 06/14  . Ostomy nurse consultation   . Pancreatitis   . Pancreatitis    hx of   . SOB (shortness of breath)   . UC (ulcerative colitis) [facility])     Patient Active Problem List   Diagnosis Date Noted  . Obesity 03/21/2016  . IIH (idiopathic intracranial hypertension) 04/02/2015  . 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 Headache 01/04/2015  . Intracranial hypertension 01/04/2015  . Dizziness  12/28/2014  . Obsessive compulsive disorder   . Ulcerative colitis ([place]) 07/18/2014    Past Surgical History:  Procedure Laterality Date  . CHOLECYSTECTOMY    . LAPAROSCOPIC PARTIAL PROTECTOMY    . PERMANENT ILEOSTOMY    . TOTAL COLECTOMY N/A      OB History    Gravida  0   Para  0   Term  0   Preterm  0   AB  0   Living  0     SAB  0   TAB  0   Ectopic  0   Multiple  0   Live Births  0            Home Medications    Prior to Admission medications   Medication Sig Start Date End Date Taking? Authorizing Provider  acetaZOLAMIDE (DIAMOX) 500 MG capsule TAKE 1 CAPSULE (500 MG TOTAL) BY MOUTH 3 (THREE) TIMES DAILY. 10/24/17   [name], MD  Cholecalciferol (VITAMIN D3) 1.25 MG (50000 UT) CAPS Take 50,000 Units by mouth every 7 (seven) days.    [provider]  fluvoxaMINE (LUVOX) 25 MG tablet Take 1 tablet (25 mg total) by mouth at bedtime. 05/02/18 05/02/19  Arfeen, Arlyce Harman, MD  HYDROcodone-acetaminophen (NORCO/VICODIN) 5-325 MG tablet Take 1 tablet by mouth every 4 (four) hours as needed. 05/13/18   Ezequiel Essex, MD  Incontinence Supplies (DRAINABLE  FECAL COLLECTOR) MISC by Does not apply route. 09/06/14   [provider]  LORazepam (ATIVAN) 0.5 MG tablet Take 1 tablet (0.5 mg total) by mouth daily as needed for anxiety. 01/15/18 01/15/19  Arfeen, Arlyce Harman, MD  OLANZapine-FLUoxetine (SYMBYAX) 6-50 MG capsule Take 1 capsule by mouth at bedtime. 06/14/18   Arfeen, Arlyce Harman, MD  ondansetron (ZOFRAN ODT) 4 MG disintegrating tablet Take 1 tablet (4 mg total) by mouth every 8 (eight) hours as needed for nausea or vomiting. 05/13/18   Ezequiel Essex, MD  Ostomy Supplies MISC by Does not apply route. 09/06/14   [provider]    Family History Family History  Problem Relation Age of Onset  . OCD Mother   . Diabetes Mother   . Depression Mother   . Anxiety disorder Mother   . Obesity Mother   . Obesity Father   . OCD Sister   .  Depression Sister   . OCD Brother   . Anxiety disorder Brother     Social History Social History   Tobacco Use  . Smoking status: Former Smoker    Types: Cigarettes, E-cigarettes    Last attempt to quit: 06/27/2009    Years since quitting: 9.0  . Smokeless tobacco: Never Used  Substance Use Topics  . Alcohol use: Yes    Alcohol/week: 0.0 standard drinks    Comment: occasionally   . Drug use: No     Allergies   Nsaids and Tolmetin   Review of Systems Review of Systems  Constitutional: Negative for chills, diaphoresis, fatigue and fever.  HENT: Negative for congestion, rhinorrhea and sneezing.   Eyes: Negative.   Respiratory: Negative for cough, chest tightness and shortness of breath.   Cardiovascular: Negative for chest pain and leg swelling.  Gastrointestinal: Positive for abdominal pain, nausea and vomiting. Negative for blood in stool and diarrhea.  Genitourinary: Negative for difficulty urinating, flank pain, frequency and hematuria.  Musculoskeletal: Negative for arthralgias and back pain.  Skin: Negative for rash.  Neurological: Negative for dizziness, speech difficulty, weakness, numbness and headaches.     Physical Exam Updated Vital Signs BP 130/79   Pulse 94   Temp 97.7 F (36.5 C) (Oral)   Resp 18   SpO2 99%   Physical Exam Constitutional:      Appearance: She is well-developed.  HENT:     Head: Normocephalic and atraumatic.  Eyes:     Pupils: Pupils are equal, round, and reactive to light.  Neck:     Musculoskeletal: Normal range of motion and neck supple.  Cardiovascular:     Rate and Rhythm: Regular rhythm. Tachycardia present.     Heart sounds: Normal heart sounds.  Pulmonary:     Effort: Pulmonary effort is normal. No respiratory distress.     Breath sounds: Normal breath sounds. No wheezing or rales.  Chest:     Chest wall: No tenderness.  Abdominal:     General: Bowel sounds are normal.     Palpations: Abdomen is soft.      Tenderness: There is no abdominal tenderness. There is no guarding or rebound.  Musculoskeletal: Normal range of motion.  Lymphadenopathy:     Cervical: No cervical adenopathy.  Skin:    General: Skin is warm and dry.     Findings: No rash.  Neurological:     Mental Status: She is alert and oriented to person, place, and time.      ED Treatments / Results  Labs (all labs  ordered are listed, but only abnormal results are displayed) Labs Reviewed  COMPREHENSIVE METABOLIC PANEL - Abnormal; Notable for the following components:      Result Value   CO2 14 (*)    Glucose, Bld 118 (*)    Creatinine, Ser 1.25 (*)    Total Protein 8.8 (*)    ALT 46 (*)    Alkaline Phosphatase 151 (*)    GFR calc non Af Amer 58 (*)    All other components within normal limits  CBC - Abnormal; Notable for the following components:   RBC 5.71 (*)    Hemoglobin 15.5 (*)    HCT 49.4 (*)    All other components within normal limits  URINALYSIS, ROUTINE W REFLEX MICROSCOPIC - Abnormal; Notable for the following components:   Color, Urine AMBER (*)    APPearance CLOUDY (*)    Hgb urine dipstick MODERATE (*)    Bilirubin Urine SMALL (*)    Protein, ur 100 (*)    Bacteria, UA MANY (*)    All other components within normal limits  URINE CULTURE  LIPASE, BLOOD  I-STAT BETA HCG BLOOD, ED ([facility], [facility], [facility] ONLY)    EKG None  Radiology No results found.  Procedures Procedures (including critical care time)  Medications Ordered in ED Medications  ondansetron (ZOFRAN) injection 4 mg (has no administration in time range)  fentaNYL (SUBLIMAZE) injection 50 mcg (has no administration in time range)  sodium chloride 0.9 % bolus 1,000 mL (has no administration in time range)  sodium chloride 0.9 % bolus 1,000 mL (1,000 mLs Intravenous New Bag/Given 06/26/18 1742)  fentaNYL (SUBLIMAZE) injection 50 mcg (50 mcg Intravenous Given 06/26/18 1743)  ondansetron (ZOFRAN) injection 4 mg (4 mg Intravenous Given  06/26/18 1742)     Initial Impression / Assessment and Plan / ED Course  I have reviewed the triage vital signs and the nursing notes.  Pertinent labs & imaging results that were available during my care of the patient were reviewed by me and considered in my medical decision making (see chart for details).     Patient with a history of ulcerative colitis and colostomy bag placement presents with increased output associated with nausea and vomiting.  She has right flank pain which is been going on for several weeks.  She was recently diagnosed with an ovarian cyst and has a follow-up appointment with an OB/GYN next week.  She has had 2 recent CT scans, one here and one in [place] with similar presentations.  I do not feel that she needs another CT scan tonight.  She was markedly tachycardic on arrival but her heart rate has improved after IV fluids.  Her heart rate is now in the 90s.  She is continuing to be nauseated and will get another dose of antiemetics.  Her abdominal exam is benign.  Her labs show mildly elevated creatinine which could be related to dehydration.  Her other labs are non-concerning.  She has some elevation in her LFTs which is similar to her prior values.  Her urine appears to be more consistent with a dirty specimen.  She currently is asymptomatic for UTI.  She does have some blood in her urine but she says she is on her menstrual cycle.  She is currently doing a p.o. trial and if she is able to tolerate this and feels better, she can likely be discharged home with follow-up with her gastroenterologist.  Care turned over to [place] to recheck pt.  Final Clinical Impressions(s) / ED Diagnoses   Final diagnoses:  Right flank pain  Non-intractable vomiting with nausea, unspecified vomiting type  Elevated serum creatinine    ED Discharge Orders    None       Malvin Johns, MD 06/26/18 4299    Malvin Johns, MD 06/26/18 [phone]

## 2018-06-28 ENCOUNTER — Telehealth (HOSPITAL_COMMUNITY): Payer: Self-pay

## 2018-06-28 ENCOUNTER — Observation Stay (HOSPITAL_COMMUNITY)
Admission: EM | Admit: 2018-06-28 | Discharge: 2018-07-01 | Disposition: A | Discharge status: Home / Self Care | Payer: BLUE CROSS/BLUE SHIELD | Attending: Internal Medicine | Admitting: Internal Medicine

## 2018-06-28 ENCOUNTER — Encounter (HOSPITAL_COMMUNITY): Payer: Self-pay | Admitting: *Deleted

## 2018-06-28 ENCOUNTER — Observation Stay (HOSPITAL_COMMUNITY): Payer: BLUE CROSS/BLUE SHIELD

## 2018-06-28 DIAGNOSIS — R112 Nausea with vomiting, unspecified: Secondary | ICD-10-CM | POA: Diagnosis present

## 2018-06-28 DIAGNOSIS — R1110 Vomiting, unspecified: Secondary | ICD-10-CM | POA: Diagnosis not present

## 2018-06-28 DIAGNOSIS — E669 Obesity, unspecified: Secondary | ICD-10-CM | POA: Insufficient documentation

## 2018-06-28 DIAGNOSIS — K219 Gastro-esophageal reflux disease without esophagitis: Secondary | ICD-10-CM | POA: Insufficient documentation

## 2018-06-28 DIAGNOSIS — G932 Benign intracranial hypertension: Secondary | ICD-10-CM | POA: Diagnosis not present

## 2018-06-28 DIAGNOSIS — D72829 Elevated white blood cell count, unspecified: Secondary | ICD-10-CM | POA: Diagnosis present

## 2018-06-28 DIAGNOSIS — F429 Obsessive-compulsive disorder, unspecified: Secondary | ICD-10-CM | POA: Diagnosis not present

## 2018-06-28 DIAGNOSIS — E871 Hypo-osmolality and hyponatremia: Secondary | ICD-10-CM | POA: Insufficient documentation

## 2018-06-28 DIAGNOSIS — K760 Fatty (change of) liver, not elsewhere classified: Secondary | ICD-10-CM | POA: Insufficient documentation

## 2018-06-28 DIAGNOSIS — Z79899 Other long term (current) drug therapy: Secondary | ICD-10-CM | POA: Insufficient documentation

## 2018-06-28 DIAGNOSIS — Z9049 Acquired absence of other specified parts of digestive tract: Secondary | ICD-10-CM | POA: Diagnosis not present

## 2018-06-28 DIAGNOSIS — Z432 Encounter for attention to ileostomy: Secondary | ICD-10-CM

## 2018-06-28 DIAGNOSIS — Z6841 Body Mass Index (BMI) 40.0 and over, adult: Secondary | ICD-10-CM | POA: Insufficient documentation

## 2018-06-28 DIAGNOSIS — Z87891 Personal history of nicotine dependence: Secondary | ICD-10-CM | POA: Diagnosis not present

## 2018-06-28 DIAGNOSIS — F329 Major depressive disorder, single episode, unspecified: Secondary | ICD-10-CM | POA: Insufficient documentation

## 2018-06-28 DIAGNOSIS — R160 Hepatomegaly, not elsewhere classified: Secondary | ICD-10-CM | POA: Insufficient documentation

## 2018-06-28 DIAGNOSIS — I10 Essential (primary) hypertension: Secondary | ICD-10-CM | POA: Insufficient documentation

## 2018-06-28 DIAGNOSIS — E860 Dehydration: Secondary | ICD-10-CM | POA: Insufficient documentation

## 2018-06-28 DIAGNOSIS — R197 Diarrhea, unspecified: Secondary | ICD-10-CM | POA: Diagnosis present

## 2018-06-28 DIAGNOSIS — R000 Tachycardia, unspecified: Secondary | ICD-10-CM | POA: Diagnosis not present

## 2018-06-28 DIAGNOSIS — F419 Anxiety disorder, unspecified: Secondary | ICD-10-CM | POA: Insufficient documentation

## 2018-06-28 DIAGNOSIS — E876 Hypokalemia: Secondary | ICD-10-CM | POA: Diagnosis not present

## 2018-06-28 DIAGNOSIS — N179 Acute kidney failure, unspecified: Secondary | ICD-10-CM | POA: Diagnosis not present

## 2018-06-28 DIAGNOSIS — E872 Acidosis, unspecified: Secondary | ICD-10-CM | POA: Diagnosis present

## 2018-06-28 DIAGNOSIS — Z933 Colostomy status: Secondary | ICD-10-CM | POA: Diagnosis not present

## 2018-06-28 DIAGNOSIS — K5190 Ulcerative colitis, unspecified, without complications: Secondary | ICD-10-CM | POA: Diagnosis not present

## 2018-06-28 DIAGNOSIS — R110 Nausea: Secondary | ICD-10-CM

## 2018-06-28 DIAGNOSIS — F32A Depression, unspecified: Secondary | ICD-10-CM | POA: Diagnosis present

## 2018-06-28 LAB — CBC
HCT: 55.3 % — ABNORMAL HIGH (ref 36.0–46.0)
Hemoglobin: 17.8 g/dL — ABNORMAL HIGH (ref 12.0–15.0)
MCH: 26.1 pg (ref 26.0–34.0)
MCHC: 32.2 g/dL (ref 30.0–36.0)
MCV: 81.1 fL (ref 80.0–100.0)
Platelets: 618 10*3/uL — ABNORMAL HIGH (ref 150–400)
RBC: 6.82 MIL/uL — ABNORMAL HIGH (ref 3.87–5.11)
RDW: 14.2 % (ref 11.5–15.5)
WBC: 13.7 10*3/uL — ABNORMAL HIGH (ref 4.0–10.5)
nRBC: 0 % (ref 0.0–0.2)

## 2018-06-28 LAB — COMPREHENSIVE METABOLIC PANEL
ALT: 63 U/L — ABNORMAL HIGH (ref 0–44)
AST: 59 U/L — ABNORMAL HIGH (ref 15–41)
Albumin: 5.6 g/dL — ABNORMAL HIGH (ref 3.5–5.0)
Alkaline Phosphatase: 194 U/L — ABNORMAL HIGH (ref 38–126)
Anion gap: 21 — ABNORMAL HIGH (ref 5–15)
BUN: 23 mg/dL — ABNORMAL HIGH (ref 6–20)
CO2: 17 mmol/L — ABNORMAL LOW (ref 22–32)
Calcium: 10.1 mg/dL (ref 8.9–10.3)
Chloride: 94 mmol/L — ABNORMAL LOW (ref 98–111)
Creatinine, Ser: 3.21 mg/dL — ABNORMAL HIGH (ref 0.44–1.00)
GFR calc Af Amer: 22 mL/min — ABNORMAL LOW (ref >60)
GFR calc non Af Amer: 19 mL/min — ABNORMAL LOW (ref >60)
Glucose, Bld: 198 mg/dL — ABNORMAL HIGH (ref 70–99)
Potassium: 3.4 mmol/L — ABNORMAL LOW (ref 3.5–5.1)
Sodium: 132 mmol/L — ABNORMAL LOW (ref 135–145)
Total Bilirubin: 0.6 mg/dL (ref 0.3–1.2)
Total Protein: 10.8 g/dL — ABNORMAL HIGH (ref 6.5–8.1)

## 2018-06-28 LAB — I-STAT BETA HCG BLOOD, ED (MC, WL, AP ONLY): I-stat hCG, quantitative: <5 m[IU]/mL (ref <5)

## 2018-06-28 LAB — URINE CULTURE

## 2018-06-28 LAB — LIPASE, BLOOD: Lipase: 35 U/L (ref 11–51)

## 2018-06-28 MED ORDER — SODIUM CHLORIDE 0.9 % IV BOLUS
1000.0000 mL | Freq: Once | INTRAVENOUS | Status: AC
  Administered 2018-06-28: 1000 mL via INTRAVENOUS

## 2018-06-28 MED ORDER — PANTOPRAZOLE SODIUM 40 MG IV SOLR
40.0000 mg | Freq: Every day | INTRAVENOUS | Status: DC
  Administered 2018-06-29 – 2018-06-30 (×3): 40 mg via INTRAVENOUS
  Filled 2018-06-28 (×3): qty 40

## 2018-06-28 MED ORDER — POTASSIUM CHLORIDE 10 MEQ/100ML IV SOLN
10.0000 meq | INTRAVENOUS | Status: AC
  Administered 2018-06-28 – 2018-06-29 (×2): 10 meq via INTRAVENOUS
  Filled 2018-06-28 (×2): qty 100

## 2018-06-28 MED ORDER — MORPHINE SULFATE (PF) 2 MG/ML IV SOLN
1.0000 mg | INTRAVENOUS | Status: DC | PRN
  Administered 2018-06-29 (×4): 1 mg via INTRAVENOUS
  Filled 2018-06-28 (×4): qty 1

## 2018-06-28 MED ORDER — HEPARIN SODIUM (PORCINE) 5000 UNIT/ML IJ SOLN
5000.0000 [IU] | Freq: Three times a day (TID) | INTRAMUSCULAR | Status: DC
  Administered 2018-06-29 (×2): 5000 [IU] via SUBCUTANEOUS
  Filled 2018-06-28 (×3): qty 1

## 2018-06-28 MED ORDER — FENTANYL CITRATE (PF) 100 MCG/2ML IJ SOLN
50.0000 ug | Freq: Once | INTRAMUSCULAR | Status: AC
  Administered 2018-06-28: 50 ug via INTRAVENOUS
  Filled 2018-06-28: qty 2

## 2018-06-28 MED ORDER — LORAZEPAM 1 MG PO TABS: 0.5000 mg | ORAL_TABLET | Freq: Every day | ORAL | Status: DC | PRN

## 2018-06-28 MED ORDER — PROMETHAZINE HCL 25 MG/ML IJ SOLN
12.5000 mg | Freq: Once | INTRAMUSCULAR | Status: AC
  Administered 2018-06-28: 12.5 mg via INTRAVENOUS
  Filled 2018-06-28: qty 1

## 2018-06-28 MED ORDER — PROMETHAZINE HCL 25 MG/ML IJ SOLN
25.0000 mg | Freq: Four times a day (QID) | INTRAMUSCULAR | Status: DC | PRN
  Administered 2018-06-29 (×3): 25 mg via INTRAVENOUS
  Filled 2018-06-28 (×3): qty 1

## 2018-06-28 MED ORDER — FLUVOXAMINE MALEATE 50 MG PO TABS
25.0000 mg | ORAL_TABLET | Freq: Every day | ORAL | Status: DC
  Administered 2018-06-29 – 2018-06-30 (×3): 25 mg via ORAL
  Filled 2018-06-28 (×4): qty 1

## 2018-06-28 MED ORDER — OLANZAPINE-FLUOXETINE HCL 3-25 MG PO CAPS
2.0000 | ORAL_CAPSULE | Freq: Every day | ORAL | Status: DC
  Administered 2018-06-29 – 2018-06-30 (×3): 2 via ORAL
  Filled 2018-06-28 (×4): qty 2

## 2018-06-28 MED ORDER — ONDANSETRON HCL 4 MG/2ML IJ SOLN
4.0000 mg | Freq: Once | INTRAMUSCULAR | Status: AC
  Administered 2018-06-28: 4 mg via INTRAVENOUS
  Filled 2018-06-28: qty 2

## 2018-06-28 NOTE — ED Notes (Signed)
IV team at bedside 

## 2018-06-28 NOTE — ED Triage Notes (Signed)
Pt in c/o continued n/v since 12/25, seen for same a few days ago, symptoms have not improved

## 2018-06-28 NOTE — ED Notes (Signed)
Admitting provider at bedside.

## 2018-06-28 NOTE — Telephone Encounter (Signed)
Patients husband is calling, he states that she has been really nauseous and was wondering if it could be from the Luvox. Patient has been on the medication for a month now. Please see ED report from 12/31 for her symptoms and advise, thank you

## 2018-06-28 NOTE — ED Provider Notes (Signed)
[place] EMERGENCY DEPARTMENT Provider Note   CSN: [id number] Arrival date & time: 06/28/18  1443     History   Chief Complaint Chief Complaint  Patient presents with  . Emesis    HPI Diana Page is a 28 y.o. female.  Patient is a 28 year old female with a history of ulcerative colitis and colostomy who presents with vomiting and increased output from her colostomy.  She was seen here 2 days ago for similar symptoms.  She improved with IV fluids and was discharged with Phenergan and Zofran.  She has been using these at home without improvement in symptoms.  She has ongoing vomiting and is not able to keep anything down.  She is had increased watery output from her colostomy.  She feels weak and dizzy.  She has no increased abdominal pain.  She is had similar symptoms in the past and is followed by Dr. Benson Norway with gastroenterology.  She has had prior CT scans with the symptoms that did not show any acute abnormality other than a right ovarian cyst which she has follow-up with an OB/GYN about.     Past Medical History:  Diagnosis Date  . Anemia   . Anxiety   . Back pain   . Depression   . Fatty liver   . Gallbladder problem   . GERD (gastroesophageal reflux disease)   . Hypertension    Intercranial Hypertension  . IBS (irritable bowel syndrome)   . Joint pain   . Obsessive compulsive disorder 06/14  . Ostomy nurse consultation   . Pancreatitis   . Pancreatitis    hx of   . SOB (shortness of breath)   . UC (ulcerative colitis) [facility])     Patient Active Problem List   Diagnosis Date Noted  . Obesity 03/21/2016  . IIH (idiopathic intracranial hypertension) 04/02/2015  . 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 Headache 01/04/2015  . Intracranial hypertension 01/04/2015  . Dizziness 12/28/2014  . Obsessive compulsive disorder   . Ulcerative colitis ([place]) 07/18/2014    Past Surgical History:  Procedure Laterality Date  .  CHOLECYSTECTOMY    . LAPAROSCOPIC PARTIAL PROTECTOMY    . PERMANENT ILEOSTOMY    . TOTAL COLECTOMY N/A      OB History    Gravida  0   Para  0   Term  0   Preterm  0   AB  0   Living  0     SAB  0   TAB  0   Ectopic  0   Multiple  0   Live Births  0            Home Medications    Prior to Admission medications   Medication Sig Start Date End Date Taking? Authorizing Provider  acetaminophen (TYLENOL) 500 MG tablet Take 500-1,000 mg by mouth every 6 (six) hours as needed (for pain).   Yes [provider]  acetaZOLAMIDE (DIAMOX) 500 MG capsule TAKE 1 CAPSULE (500 MG TOTAL) BY MOUTH 3 (THREE) TIMES DAILY. 10/24/17  Yes [name], MD  calcium carbonate (TUMS - DOSED IN MG ELEMENTAL CALCIUM) 500 MG chewable tablet Chew 1-2 tablets by mouth as needed for indigestion or heartburn.   Yes [provider]  fluvoxaMINE (LUVOX) 25 MG tablet Take 1 tablet (25 mg total) by mouth at bedtime. 05/02/18 05/02/19 Yes Arfeen, Arlyce Harman, MD  HYDROcodone-acetaminophen (NORCO/VICODIN) 5-325 MG tablet Take 2 tablets by mouth every  4 (four) hours as needed. Patient taking differently: Take 2 tablets by mouth every 4 (four) hours as needed for moderate pain.  06/26/18  Yes Henderly, Britni A, PA-C  LORazepam (ATIVAN) 0.5 MG tablet Take 1 tablet (0.5 mg total) by mouth daily as needed for anxiety. 01/15/18 01/15/19 Yes Arfeen, Arlyce Harman, MD  OLANZapine-FLUoxetine (SYMBYAX) 6-50 MG capsule Take 1 capsule by mouth at bedtime. 06/14/18  Yes Arfeen, Arlyce Harman, MD  ondansetron (ZOFRAN ODT) 4 MG disintegrating tablet Take 1 tablet (4 mg total) by mouth every 8 (eight) hours as needed for nausea or vomiting. 05/13/18  Yes Rancour, Annie Main, MD  promethazine (PHENERGAN) 25 MG tablet Take 1 tablet (25 mg total) by mouth every 6 (six) hours as needed for nausea or vomiting. 06/26/18  Yes Henderly, Britni A, PA-C  Incontinence Supplies (DRAINABLE FECAL COLLECTOR) MISC by Does not apply route.  09/06/14   [provider]  Ostomy Supplies MISC by Does not apply route. 09/06/14   [provider]    Family History Family History  Problem Relation Age of Onset  . OCD Mother   . Diabetes Mother   . Depression Mother   . Anxiety disorder Mother   . Obesity Mother   . Obesity Father   . OCD Sister   . Depression Sister   . OCD Brother   . Anxiety disorder Brother     Social History Social History   Tobacco Use  . Smoking status: Former Smoker    Types: Cigarettes, E-cigarettes    Last attempt to quit: 06/27/2009    Years since quitting: 9.0  . Smokeless tobacco: Never Used  Substance Use Topics  . Alcohol use: Yes    Alcohol/week: 0.0 standard drinks    Comment: occasionally   . Drug use: No     Allergies   Nsaids and Tolmetin   Review of Systems Review of Systems  Constitutional: Positive for fatigue. Negative for chills, diaphoresis and fever.  HENT: Negative for congestion, rhinorrhea and sneezing.   Eyes: Negative.   Respiratory: Negative for cough, chest tightness and shortness of breath.   Cardiovascular: Negative for chest pain and leg swelling.  Gastrointestinal: Positive for abdominal pain (Right flank pain), diarrhea, nausea and vomiting. Negative for blood in stool.  Genitourinary: Negative for difficulty urinating, flank pain, frequency and hematuria.  Musculoskeletal: Negative for arthralgias and back pain.  Skin: Negative for rash.  Neurological: Positive for light-headedness. Negative for dizziness, speech difficulty, weakness, numbness and headaches.     Physical Exam Updated Vital Signs BP 112/76 (BP Location: Right Arm)   Pulse (!) 116   Temp 97.7 F (36.5 C) (Oral)   Resp 18   Ht 5' 2"  (1.575 m)   Wt 106.6 kg   SpO2 100%   BMI 42.98 kg/m   Physical Exam Constitutional:      Appearance: She is well-developed.  HENT:     Head: Normocephalic and atraumatic.  Eyes:     Pupils: Pupils are equal, round, and  reactive to light.  Neck:     Musculoskeletal: Normal range of motion and neck supple.  Cardiovascular:     Rate and Rhythm: Regular rhythm. Tachycardia present.     Heart sounds: Normal heart sounds.  Pulmonary:     Effort: Pulmonary effort is normal. No respiratory distress.     Breath sounds: Normal breath sounds. No wheezing or rales.  Chest:     Chest wall: No tenderness.  Abdominal:  General: Bowel sounds are normal.     Palpations: Abdomen is soft.     Tenderness: There is no abdominal tenderness. There is no guarding or rebound.  Musculoskeletal: Normal range of motion.  Lymphadenopathy:     Cervical: No cervical adenopathy.  Skin:    General: Skin is warm and dry.     Findings: No rash.  Neurological:     Mental Status: She is alert and oriented to person, place, and time.      ED Treatments / Results  Labs (all labs ordered are listed, but only abnormal results are displayed) Labs Reviewed  COMPREHENSIVE METABOLIC PANEL - Abnormal; Notable for the following components:      Result Value   Sodium 132 (*)    Potassium 3.4 (*)    Chloride 94 (*)    CO2 17 (*)    Glucose, Bld 198 (*)    BUN 23 (*)    Creatinine, Ser 3.21 (*)    Total Protein 10.8 (*)    Albumin 5.6 (*)    AST 59 (*)    ALT 63 (*)    Alkaline Phosphatase 194 (*)    GFR calc non Af Amer 19 (*)    GFR calc Af Amer 22 (*)    Anion gap 21 (*)    All other components within normal limits  CBC - Abnormal; Notable for the following components:   WBC 13.7 (*)    RBC 6.82 (*)    Hemoglobin 17.8 (*)    HCT 55.3 (*)    Platelets 618 (*)    All other components within normal limits  LIPASE, BLOOD  URINALYSIS, ROUTINE W REFLEX MICROSCOPIC  I-STAT BETA HCG BLOOD, ED ([facility], [facility], [facility] ONLY)    EKG None  Radiology No results found.  Procedures Procedures (including critical care time)  Medications Ordered in ED Medications  sodium chloride 0.9 % bolus 1,000 mL (1,000 mLs Intravenous New  Bag/Given 06/28/18 2005)  ondansetron (ZOFRAN) injection 4 mg (4 mg Intravenous Given 06/28/18 1757)  sodium chloride 0.9 % bolus 1,000 mL (0 mLs Intravenous Stopped 06/28/18 2005)  fentaNYL (SUBLIMAZE) injection 50 mcg (50 mcg Intravenous Given 06/28/18 1757)  promethazine (PHENERGAN) injection 12.5 mg (12.5 mg Intravenous Given 06/28/18 2009)  fentaNYL (SUBLIMAZE) injection 50 mcg (50 mcg Intravenous Given 06/28/18 2009)     Initial Impression / Assessment and Plan / ED Course  I have reviewed the triage vital signs and the nursing notes.  Pertinent labs & imaging results that were available during my care of the patient were reviewed by me and considered in my medical decision making (see chart for details).    Patient is a 28 year old female who presents with vomiting and increased output from her colostomy.  She has right flank pain is similar to her past presentations.  She has had 2- CT scans in the last month.  I do not feel that she needs further imaging tonight.  She does have signs of dehydration and has an acute kidney injury.  Her other labs are similar to baseline values.  She was given IV fluids and antiemetics.  I have spoken to the hospitalist who will admit the patient for further treatment.  Final Clinical Impressions(s) / ED Diagnoses   Final diagnoses:  Nausea vomiting and diarrhea  AKI (acute kidney injury) [facility])    ED Discharge Orders    None       Malvin Johns, MD 06/28/18 2018

## 2018-06-28 NOTE — ED Notes (Signed)
Pt requesting IV team. No IV access visible.

## 2018-06-28 NOTE — Telephone Encounter (Signed)
She is a history of ulcerative colitis.  However if she noticed since starting Luvox she is having nausea then she should discontinue Luvox to see if symptoms resolve.

## 2018-06-29 ENCOUNTER — Other Ambulatory Visit: Payer: Self-pay

## 2018-06-29 DIAGNOSIS — K219 Gastro-esophageal reflux disease without esophagitis: Secondary | ICD-10-CM | POA: Diagnosis present

## 2018-06-29 DIAGNOSIS — E872 Acidosis, unspecified: Secondary | ICD-10-CM | POA: Diagnosis present

## 2018-06-29 DIAGNOSIS — D72829 Elevated white blood cell count, unspecified: Secondary | ICD-10-CM | POA: Diagnosis present

## 2018-06-29 DIAGNOSIS — K51818 Other ulcerative colitis with other complication: Secondary | ICD-10-CM

## 2018-06-29 DIAGNOSIS — N179 Acute kidney failure, unspecified: Secondary | ICD-10-CM | POA: Diagnosis present

## 2018-06-29 DIAGNOSIS — E871 Hypo-osmolality and hyponatremia: Secondary | ICD-10-CM | POA: Diagnosis present

## 2018-06-29 DIAGNOSIS — F419 Anxiety disorder, unspecified: Secondary | ICD-10-CM | POA: Diagnosis present

## 2018-06-29 DIAGNOSIS — E876 Hypokalemia: Secondary | ICD-10-CM | POA: Diagnosis present

## 2018-06-29 DIAGNOSIS — F32A Depression, unspecified: Secondary | ICD-10-CM | POA: Diagnosis present

## 2018-06-29 DIAGNOSIS — R112 Nausea with vomiting, unspecified: Secondary | ICD-10-CM | POA: Diagnosis not present

## 2018-06-29 DIAGNOSIS — F329 Major depressive disorder, single episode, unspecified: Secondary | ICD-10-CM | POA: Diagnosis present

## 2018-06-29 LAB — HEPATIC FUNCTION PANEL
ALT: 45 U/L — ABNORMAL HIGH (ref 0–44)
AST: 35 U/L (ref 15–41)
Albumin: 4.2 g/dL (ref 3.5–5.0)
Alkaline Phosphatase: 152 U/L — ABNORMAL HIGH (ref 38–126)
Bilirubin, Direct: 0.1 mg/dL (ref 0.0–0.2)
Indirect Bilirubin: 0.4 mg/dL (ref 0.3–0.9)
Total Bilirubin: 0.5 mg/dL (ref 0.3–1.2)
Total Protein: 8 g/dL (ref 6.5–8.1)

## 2018-06-29 LAB — BASIC METABOLIC PANEL WITH GFR
Anion gap: 14 (ref 5–15)
BUN: 20 mg/dL (ref 6–20)
CO2: 15 mmol/L — ABNORMAL LOW (ref 22–32)
Calcium: 8.8 mg/dL — ABNORMAL LOW (ref 8.9–10.3)
Chloride: 103 mmol/L (ref 98–111)
Creatinine, Ser: 1.61 mg/dL — ABNORMAL HIGH (ref 0.44–1.00)
GFR calc Af Amer: 50 mL/min — ABNORMAL LOW
GFR calc non Af Amer: 43 mL/min — ABNORMAL LOW
Glucose, Bld: 138 mg/dL — ABNORMAL HIGH (ref 70–99)
Potassium: 3.2 mmol/L — ABNORMAL LOW (ref 3.5–5.1)
Sodium: 132 mmol/L — ABNORMAL LOW (ref 135–145)

## 2018-06-29 LAB — URINALYSIS, MICROSCOPIC (REFLEX)

## 2018-06-29 LAB — URINALYSIS, ROUTINE W REFLEX MICROSCOPIC
Bilirubin Urine: NEGATIVE
Glucose, UA: NEGATIVE mg/dL
Ketones, ur: 5 mg/dL — AB
Leukocytes, UA: NEGATIVE
Nitrite: NEGATIVE
Protein, ur: 100 mg/dL — AB
Specific Gravity, Urine: 1.021 (ref 1.005–1.030)
pH: 6 (ref 5.0–8.0)

## 2018-06-29 LAB — CBC
HCT: 44.4 % (ref 36.0–46.0)
Hemoglobin: 14.6 g/dL (ref 12.0–15.0)
MCH: 26 pg (ref 26.0–34.0)
MCHC: 32.9 g/dL (ref 30.0–36.0)
MCV: 79.1 fL — ABNORMAL LOW (ref 80.0–100.0)
Platelets: 440 K/uL — ABNORMAL HIGH (ref 150–400)
RBC: 5.61 MIL/uL — ABNORMAL HIGH (ref 3.87–5.11)
RDW: 13.4 % (ref 11.5–15.5)
WBC: 15.6 K/uL — ABNORMAL HIGH (ref 4.0–10.5)
nRBC: 0 % (ref 0.0–0.2)

## 2018-06-29 LAB — LACTIC ACID, PLASMA: Lactic Acid, Venous: 1.4 mmol/L (ref 0.5–1.9)

## 2018-06-29 LAB — HIV ANTIBODY (ROUTINE TESTING W REFLEX): HIV Screen 4th Generation wRfx: NONREACTIVE

## 2018-06-29 LAB — PHOSPHORUS: Phosphorus: 3.7 mg/dL (ref 2.5–4.6)

## 2018-06-29 LAB — MAGNESIUM: Magnesium: 2.1 mg/dL (ref 1.7–2.4)

## 2018-06-29 MED ORDER — POTASSIUM CHLORIDE 10 MEQ/100ML IV SOLN
10.0000 meq | INTRAVENOUS | Status: AC
  Administered 2018-06-29 (×4): 10 meq via INTRAVENOUS
  Filled 2018-06-29 (×4): qty 100

## 2018-06-29 MED ORDER — PROMETHAZINE HCL 25 MG/ML IJ SOLN
12.5000 mg | Freq: Four times a day (QID) | INTRAMUSCULAR | Status: DC | PRN
  Administered 2018-06-29: 12.5 mg via INTRAVENOUS
  Filled 2018-06-29: qty 1

## 2018-06-29 MED ORDER — MORPHINE SULFATE (PF) 2 MG/ML IV SOLN
1.0000 mg | INTRAVENOUS | Status: DC | PRN
  Administered 2018-06-29 – 2018-06-30 (×8): 1 mg via INTRAVENOUS
  Filled 2018-06-29 (×8): qty 1

## 2018-06-29 MED ORDER — SODIUM CHLORIDE 0.9 % IV SOLN
INTRAVENOUS | Status: DC
  Administered 2018-06-29: 06:00:00 via INTRAVENOUS

## 2018-06-29 MED ORDER — PIPERACILLIN-TAZOBACTAM 3.375 G IVPB
3.3750 g | Freq: Three times a day (TID) | INTRAVENOUS | Status: DC
  Administered 2018-06-29 – 2018-06-30 (×4): 3.375 g via INTRAVENOUS
  Filled 2018-06-29 (×5): qty 50

## 2018-06-29 MED ORDER — SODIUM CHLORIDE 0.9 % IV BOLUS
500.0000 mL | Freq: Once | INTRAVENOUS | Status: AC
  Administered 2018-06-29: 500 mL via INTRAVENOUS

## 2018-06-29 MED ORDER — BOOST / RESOURCE BREEZE PO LIQD CUSTOM
1.0000 | Freq: Three times a day (TID) | ORAL | Status: DC
  Administered 2018-06-29: 1 via ORAL

## 2018-06-29 MED ORDER — POTASSIUM CHLORIDE CRYS ER 20 MEQ PO TBCR: 40.0000 meq | EXTENDED_RELEASE_TABLET | ORAL | Status: DC

## 2018-06-29 MED ORDER — SODIUM CHLORIDE 0.9 % IV SOLN
INTRAVENOUS | Status: AC
  Administered 2018-06-29: 16:00:00 via INTRAVENOUS

## 2018-06-29 MED ORDER — SODIUM BICARBONATE 8.4 % IV SOLN
25.0000 meq | Freq: Once | INTRAVENOUS | Status: AC
  Administered 2018-06-29: 25 meq via INTRAVENOUS
  Filled 2018-06-29: qty 50

## 2018-06-29 MED ORDER — SODIUM CHLORIDE 0.9 % IV BOLUS: 500.0000 mL | Freq: Once | INTRAVENOUS | Status: DC

## 2018-06-29 MED ORDER — PROMETHAZINE HCL 25 MG/ML IJ SOLN
12.5000 mg | INTRAMUSCULAR | Status: DC | PRN
  Administered 2018-06-30 – 2018-07-01 (×7): 25 mg via INTRAVENOUS
  Filled 2018-06-29 (×7): qty 1

## 2018-06-29 NOTE — H&P (Signed)
History and Physical    Diana Page KHT:[id number] DOB: May 11, 1991 DOA: 06/28/2018  PCP: Patient, No Pcp Per Patient coming from: Home  Chief Complaint: Emesis  HPI: Diana Page is a 28 y.o. female with medical history significant of ulcerative colitis s/p TAC and end ileostomy, proctectomy, hypertension, GERD, depression presenting to the hospital for evaluation of emesis.  Patient reports having multiple episodes of emesis and a lot of output through her ileostomy on Christmas Day.  Symptoms subsequently improved and then started again 3 days ago.  She continues to have multiple episodes of emesis and is emptying her ileostomy bag 8-10 times per day.  Reports having pain in the epigastric and right upper quadrant abdominal areas.  Reports having generalized weakness and lightheadedness.  She has not been able to tolerate p.o. intake.  No other family member is sick.  Dr. Benson Norway is her gastroenterologist.  Review of Systems: As per HPI otherwise 10 point review of systems negative.  Past Medical History:  Diagnosis Date  . Anemia   . Anxiety   . Back pain   . Depression   . Fatty liver   . Gallbladder problem   . GERD (gastroesophageal reflux disease)   . Hypertension    Intercranial Hypertension  . IBS (irritable bowel syndrome)   . Joint pain   . Obsessive compulsive disorder 06/14  . Ostomy nurse consultation   . Pancreatitis   . Pancreatitis    hx of   . SOB (shortness of breath)   . UC (ulcerative colitis) [facility])     Past Surgical History:  Procedure Laterality Date  . CHOLECYSTECTOMY    . LAPAROSCOPIC PARTIAL PROTECTOMY    . PERMANENT ILEOSTOMY    . TOTAL COLECTOMY N/A      reports that she quit smoking about 9 years ago. Her smoking use included cigarettes and e-cigarettes. She has never used smokeless tobacco. She reports current alcohol use. She reports that she does not use drugs.  Allergies  Allergen Reactions  . Nsaids Other (See Comments)    Cannot  have because of her Ulcerative Colitis  . Tolmetin Other (See Comments)    Cannot have because of her Ulcerative Colitis    Family History  Problem Relation Age of Onset  . OCD Mother   . Diabetes Mother   . Depression Mother   . Anxiety disorder Mother   . Obesity Mother   . Obesity Father   . OCD Sister   . Depression Sister   . OCD Brother   . Anxiety disorder Brother     Prior to Admission medications   Medication Sig Start Date End Date Taking? Authorizing Provider  acetaminophen (TYLENOL) 500 MG tablet Take 500-1,000 mg by mouth every 6 (six) hours as needed (for pain).   Yes [provider]  acetaZOLAMIDE (DIAMOX) 500 MG capsule TAKE 1 CAPSULE (500 MG TOTAL) BY MOUTH 3 (THREE) TIMES DAILY. 10/24/17  Yes [name], MD  calcium carbonate (TUMS - DOSED IN MG ELEMENTAL CALCIUM) 500 MG chewable tablet Chew 1-2 tablets by mouth as needed for indigestion or heartburn.   Yes [provider]  fluvoxaMINE (LUVOX) 25 MG tablet Take 1 tablet (25 mg total) by mouth at bedtime. 05/02/18 05/02/19 Yes Arfeen, Arlyce Harman, MD  HYDROcodone-acetaminophen (NORCO/VICODIN) 5-325 MG tablet Take 2 tablets by mouth every 4 (four) hours as needed. Patient taking differently: Take 2 tablets by mouth every 4 (four) hours as needed for moderate pain.  06/26/18  Yes  Henderly, Britni A, PA-C  LORazepam (ATIVAN) 0.5 MG tablet Take 1 tablet (0.5 mg total) by mouth daily as needed for anxiety. 01/15/18 01/15/19 Yes Arfeen, Arlyce Harman, MD  OLANZapine-FLUoxetine (SYMBYAX) 6-50 MG capsule Take 1 capsule by mouth at bedtime. 06/14/18  Yes Arfeen, Arlyce Harman, MD  ondansetron (ZOFRAN ODT) 4 MG disintegrating tablet Take 1 tablet (4 mg total) by mouth every 8 (eight) hours as needed for nausea or vomiting. 05/13/18  Yes Rancour, Annie Main, MD  promethazine (PHENERGAN) 25 MG tablet Take 1 tablet (25 mg total) by mouth every 6 (six) hours as needed for nausea or vomiting. 06/26/18  Yes Henderly, Britni A, PA-C    Incontinence Supplies (DRAINABLE FECAL COLLECTOR) MISC by Does not apply route. 09/06/14   [provider]  Ostomy Supplies MISC by Does not apply route. 09/06/14   [provider]    Physical Exam: Vitals:   06/28/18 2015 06/28/18 2249 06/29/18 0212 06/29/18 0502  BP: 112/76 115/86 113/76 122/79  Pulse: (!) 116 (!) 107 (!) 106 97  Resp: 18 18  16   Temp:  98 F (36.7 C) (!) 97.5 F (36.4 C) 98.6 F (37 C)  TempSrc:  Oral Oral Oral  SpO2: 100% 97% 97% 100%  Weight:      Height:        Physical Exam  Constitutional: She is oriented to person, place, and time. She appears well-developed and well-nourished. No distress.  HENT:  Head: Normocephalic.  Dry mucous membranes  Eyes: Right eye exhibits no discharge. Left eye exhibits no discharge.  Neck: Neck supple.  Cardiovascular: Regular rhythm and intact distal pulses.  Slightly tachycardic  Pulmonary/Chest: Effort normal and breath sounds normal. No respiratory distress. She has no wheezes. She has no rales.  Abdominal: Soft. Bowel sounds are normal. She exhibits no distension. There is abdominal tenderness. There is no rebound and no guarding.  Right upper quadrant tender to palpation Ostomy bag filled with green liquid output  Musculoskeletal:        General: No edema.  Neurological: She is alert and oriented to person, place, and time.  Skin: Skin is warm and dry. She is not diaphoretic.     Labs on Admission: I have personally reviewed following labs and imaging studies  CBC: Recent Labs  Lab 06/26/18 1445 06/28/18 1555 06/29/18 0309  WBC 9.2 13.7* 15.6*  HGB 15.5* 17.8* 14.6  HCT 49.4* 55.3* 44.4  MCV 86.5 81.1 79.1*  PLT 384 618* 149*   Basic Metabolic Panel: Recent Labs  Lab 06/26/18 1445 06/28/18 1555 06/28/18 2315 06/29/18 0309  NA 136 132*  --  132*  K 3.8 3.4*  --  3.2*  CL 107 94*  --  103  CO2 14* 17*  --  15*  GLUCOSE 118* 198*  --  138*  BUN 14 23*  --  20  CREATININE  1.25* 3.21*  --  1.61*  CALCIUM 9.3 10.1  --  8.8*  MG  --   --  2.1  --   PHOS  --   --  3.7  --    GFR: Estimated Creatinine Clearance: 59.2 mL/min (A) (by C-G formula based on SCr of 1.61 mg/dL (H)). Liver Function Tests: Recent Labs  Lab 06/26/18 1445 06/28/18 1555 06/29/18 0309  AST 38 59* 35  ALT 46* 63* 45*  ALKPHOS 151* 194* 152*  BILITOT 0.3 0.6 0.5  PROT 8.8* 10.8* 8.0  ALBUMIN 4.6 5.6* 4.2   Recent Labs  Lab  06/26/18 1445 06/28/18 1555  LIPASE 27 35   No results for input(s): AMMONIA in the last 168 hours. Coagulation Profile: No results for input(s): INR, PROTIME in the last 168 hours. Cardiac Enzymes: No results for input(s): CKTOTAL, CKMB, CKMBINDEX, TROPONINI in the last 168 hours. BNP (last 3 results) No results for input(s): PROBNP in the last 8760 hours. HbA1C: No results for input(s): HGBA1C in the last 72 hours. CBG: No results for input(s): GLUCAP in the last 168 hours. Lipid Profile: No results for input(s): CHOL, HDL, LDLCALC, TRIG, CHOLHDL, LDLDIRECT in the last 72 hours. Thyroid Function Tests: No results for input(s): TSH, T4TOTAL, FREET4, T3FREE, THYROIDAB in the last 72 hours. Anemia Panel: No results for input(s): VITAMINB12, FOLATE, FERRITIN, TIBC, IRON, RETICCTPCT in the last 72 hours. Urine analysis:    Component Value Date/Time   COLORURINE YELLOW 06/29/2018 0155   APPEARANCEUR CLOUDY (A) 06/29/2018 0155   LABSPEC 1.021 06/29/2018 0155   PHURINE 6.0 06/29/2018 0155   GLUCOSEU NEGATIVE 06/29/2018 0155   HGBUR LARGE (A) 06/29/2018 0155   BILIRUBINUR NEGATIVE 06/29/2018 0155   KETONESUR 5 (A) 06/29/2018 0155   PROTEINUR 100 (A) 06/29/2018 0155   UROBILINOGEN 0.2 12/27/2014 0003   NITRITE NEGATIVE 06/29/2018 0155   LEUKOCYTESUR NEGATIVE 06/29/2018 0155    Radiological Exams on Admission: Ct Abdomen Pelvis Wo Contrast  Result Date: 06/28/2018 CLINICAL DATA:  Vomiting with increased colostomy output. History of ulcerative  colitis. EXAM: CT ABDOMEN AND PELVIS WITHOUT CONTRAST TECHNIQUE: Multidetector CT imaging of the abdomen and pelvis was performed following the standard protocol without IV contrast. COMPARISON:  CT abdomen pelvis dated May 12, 2018. FINDINGS: Lower chest: No acute abnormality. Hepatobiliary: Unchanged hepatomegaly with severe hepatic steatosis. No focal liver abnormality. Prior cholecystectomy. No biliary dilatation. Pancreas: Unremarkable. No pancreatic ductal dilatation or surrounding inflammatory changes. Spleen: Normal in size without focal abnormality. Adrenals/Urinary Tract: Adrenal glands are unremarkable. Kidneys are normal, without renal calculi, focal lesion, or hydronephrosis. Bladder is unremarkable. Stomach/Bowel: The stomach is within normal limits. 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 No bowel wall thickening, distention, or surrounding inflammatory changes. Vascular/Lymphatic: No significant vascular findings are present. No enlarged abdominal or pelvic lymph nodes. Reproductive: Uterus and bilateral adnexa are unremarkable. Other: No abdominal wall hernia or abnormality. No abdominopelvic ascites. No pneumoperitoneum. Musculoskeletal: No acute or significant osseous findings. IMPRESSION: 1.  No acute intra-abdominal process. 2. Unchanged hepatomegaly and severe steatosis. 3. 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 Electronically Signed   By: Titus Dubin M.D.   On: 06/28/2018 22:42    Assessment/Plan Principal Problem:   Emesis Active Problems:   AKI (acute kidney injury) ([place])   Depression   Anxiety   GERD (gastroesophageal reflux disease)   Emesis, increased ileostomy output History of ulcerative colitis s/p TAC and end ileostomy, proctectomy. Presenting with multiple episodes of emesis and increased ostomy output for several days.  Appears dehydrated on exam.  Tachycardic in the ED, now resolved after IV fluid resuscitation.   Patient is afebrile.  White count elevated. Afebrile.  Potassium mildly low.  Bicarb low.  ALT and alk phos elevated.  AST, T bili, and lipase normal.  CT abdomen pelvis showing hepatomegaly and severe steatosis; no acute process.  Viral gastroenteritis less likely as symptoms initially improved and then started again a few days later.  No other family members ill. -Discussed with Dr. Benson Norway from GI.  He will see the patient in the morning. -Continue IV fluid  hydration -Start Zosyn to cover for possible intraabdominal source of infection  -Bicarb supplementation -Monitor electrolytes and replete -IV Phenergan PRN -IV morphine 1 mg every 4 hours as needed for severe pain -Clear liquid diet -Ostomy care  AKI Likely secondary to dehydration.  Creatinine 3.2 on admission; baseline 1.7-2.9.  Now improved to 1.6 with IV fluids. -Continue IV fluid -Continue to monitor BMP -Monitor urine output -Avoid nephrotoxic agents/contrast  Depression -Continue fluvoxamine, symbyax  Anxiety -Continue home Ativan PRN  GERD -Continue PPI  DVT prophylaxis: Subcutaneous heparin Code Status: Full code Family Communication: Family at bedside. Disposition Plan: Anticipate discharge after clinical improvement. Consults called: GI Admission status: Observation   Shela Leff MD [facility] Pager [phone]  If 7PM-7AM, please contact night-coverage [email or web address] Password [facility]  06/29/2018, 5:34 AM

## 2018-06-29 NOTE — Progress Notes (Signed)
Initial Nutrition Assessment  DOCUMENTATION CODES:   Morbid obesity  INTERVENTION:   - Continue Boost Breeze TID as ordered (each provides 250 kcal and 9 g protein) - Monitor for diet advancement   NUTRITION DIAGNOSIS:   Inadequate oral intake related to acute illness, vomiting as evidenced by estimated needs, per patient/family report.  GOAL:   Patient will meet greater than or equal to 90% of their needs  MONITOR:   PO intake, Diet advancement, Labs  REASON FOR ASSESSMENT:   Malnutrition Screening Tool    ASSESSMENT:   28 yo female, admitted with emesis and increased ileostomy output. PMH significant for ulcerative colitis s/p TAC and end ileostomy, anemia, fatty liver, gallbladder problem, GERD, HTN, IBS, OCD, pancreatitis. Home meds include TUMS.  Labs: sodium 132, potassium 3.2, glucose 138, Creatinine 1.61, ALP 152, ALT 45, GFR 43%/50% Meds: Boost Breeze TID, Protonix injection 40 mg daily  Pt asleep at time of visit, but woke easily.  Pt reports continued poor appetite. Typically eats 2-3 meals daily, depending on work schedule. Does not follow any special diet and at times takes gummy MVI. Had some broth early this morning. Has not had Boost Breeze yet.  Pt denies recent wt loss. Per chart, wt relatively stable over past 3 months.  Endorses nausea with mild exertion, suspects lack of intake contributing this. Reports ileostomy output is normally fairly watery, but has been very watery with this illness. Output is higher than normal, but improving.  Encouraged pt to continuing trying to drink as she is able.   NUTRITION - FOCUSED PHYSICAL EXAM:   Most Recent Value  Orbital Region  No depletion  Upper Arm Region  No depletion  Thoracic and Lumbar Region  No depletion  Buccal Region  No depletion  Temple Region  No depletion  Clavicle Bone Region  No depletion  Clavicle and Acromion Bone Region  No depletion  Scapular Bone Region  No depletion  Dorsal  Hand  No depletion  Patellar Region  No depletion  Anterior Thigh Region  No depletion  Posterior Calf Region  No depletion  Edema (RD Assessment)  Mild  Hair  Reviewed  Eyes  Reviewed  Mouth  Reviewed  Skin  Reviewed  Nails  Other (Comment) weak, per pt      Diet Order:  75% of breakfast completed on 1/3, per nsg documentation Diet Order            Diet clear liquid Room service appropriate? Yes; Fluid consistency: Thin  Diet effective now              EDUCATION NEEDS:  No education needs have been identified at this time  Skin:  Skin Assessment: Reviewed RN Assessment  Last BM:  1/3, via ileostomy  Height:  Ht Readings from Last 1 Encounters:  06/28/18 5' 2"  (1.575 m)    Weight:  Wt Readings from Last 1 Encounters:  06/28/18 106.6 kg    Ideal Body Weight:  50 kg  BMI:  Body mass index is 42.98 kg/m.  Estimated Nutritional Needs:   Kcal:  1505-6979 calories daily (12-15 kcal/kg ABW)  Protein:  60-75 gm daily (12.-1.5 g/kg IBW)  Fluid:  >/= 1.3 L daily or per MD discretion  Althea Grimmer, MS, RDN, LDN Pager: [phone] Available Mondays and Fridays, 9am-2pm

## 2018-06-29 NOTE — Progress Notes (Signed)
[facility] PROGRESS NOTE  Diana Page RFF:[id number] DOB: 1991-01-26 DOA: 06/28/2018 PCP: Patient, No Pcp Per  Assessment/Plan:  Emesis, increased ileostomy output History of ulcerative colitis s/p TAC and end ileostomy, proctectomy. Presented with multiple episodes of emesis and increased ostomy output for several days.  Appeared dehydrated on exam. CT abdomen pelvis showing hepatomegaly and severe steatosis; no acute process.  Viral gastroenteritis less likely as symptoms initially improved and then started again a few days later.  No other family members ill. Reports some improvement this am. Able to tolerate clear liquid. Complains abdominal pain. WBC tending up. Afebrile and non-toxic appearing.  Reports pain med received in ED "better". Evaluated by dr hung who opines clinical presentation consistent with viral source. recommends continuing current plan.  -Continue IV fluid hydration -continue Zosyn to cover for possible intraabdominal source of infection  -Bicarb supplementation given -bmet in am -IV Phenergan PRN will decrease dose -IV morphine 1 mg every 2 hours as needed for severe pain -Clear liquid diet -Ostomy care -intake and output -if no improvement tomorrow will obtain gi pathogen panel  AKI Likely secondary to dehydration.  Creatinine 3.2 on admission; baseline 1.7-2.9.  improved to 1.6 with IV fluids. -Continue IV fluid -Continue to monitor BMP -Monitor urine output -Avoid nephrotoxic agents/contrast  Metabolic acidosis. Related to dehydration and increased ostomy output. Given bicarb. -see above -bmet in am  Hypokalemia/hyponatremia: related to above -iv fluids -repleated -will recheck  Depression -Continue fluvoxamine, symbyax  Anxiety -Continue home Ativan PRN  GERD Code Status: full Family Communication: none present Disposition Plan: home hopefully in am   Consultants:  Benson Norway gi  Procedures:    Antibiotics:  Zosyn  06/29/18>>  HPI/Subjective: presented with acute nausea, vomiting, and high output ileostomy on December 25th when she was visiting in [place].  The patient was treated conservatively and sent home from the ER.  Her symptoms were mildly improved after her D/C.  On 06/26/2018 she presented to [facility] ER with persistent symptoms, but she was not severely dehydrated.  Her creatinine was only at 1.25 and her baseline value was at .9.  Her symptoms continued to persist and she represented last evening and her creatinine was now at 3.21.  Overnight she received IV hydration   Objective: Vitals:   06/29/18 0502 06/29/18 1316  BP: 122/79 107/81  Pulse: 97 94  Resp: 16 16  Temp: 98.6 F (37 C) 97.6 F (36.4 C)  SpO2: 100% 99%    Intake/Output Summary (Last 24 hours) at 06/29/2018 1525 Last data filed at 06/29/2018 1500 Gross per 24 hour  Intake 1830 ml  Output -  Net 1830 ml   Filed Weights   06/28/18 1454  Weight: 106.6 kg    Exam:   General:  Well nourished in no acute distress  Cardiovascular: rrr no mgr no LE edema  Respiratory: normal effort BS clear bilaterally no wheeze  Abdomen: non-distended +BS ostomy with moderate amount liquid brown stool  Musculoskeletal: joints without swelling/erythema   Data Reviewed: Basic Metabolic Panel: Recent Labs  Lab 06/26/18 1445 06/28/18 1555 06/28/18 2315 06/29/18 0309  NA 136 132*  --  132*  K 3.8 3.4*  --  3.2*  CL 107 94*  --  103  CO2 14* 17*  --  15*  GLUCOSE 118* 198*  --  138*  BUN 14 23*  --  20  CREATININE 1.25* 3.21*  --  1.61*  CALCIUM 9.3 10.1  --  8.8*  MG  --   --  2.1  --   PHOS  --   --  3.7  --    Liver Function Tests: Recent Labs  Lab 06/26/18 1445 06/28/18 1555 06/29/18 0309  AST 38 59* 35  ALT 46* 63* 45*  ALKPHOS 151* 194* 152*  BILITOT 0.3 0.6 0.5  PROT 8.8* 10.8* 8.0  ALBUMIN 4.6 5.6* 4.2   Recent Labs  Lab 06/26/18 1445 06/28/18 1555  LIPASE 27 35   No results for input(s): AMMONIA in  the last 168 hours. CBC: Recent Labs  Lab 06/26/18 1445 06/28/18 1555 06/29/18 0309  WBC 9.2 13.7* 15.6*  HGB 15.5* 17.8* 14.6  HCT 49.4* 55.3* 44.4  MCV 86.5 81.1 79.1*  PLT 384 618* 440*   Cardiac Enzymes: No results for input(s): CKTOTAL, CKMB, CKMBINDEX, TROPONINI in the last 168 hours. BNP (last 3 results) No results for input(s): BNP in the last 8760 hours.  ProBNP (last 3 results) No results for input(s): PROBNP in the last 8760 hours.  CBG: No results for input(s): GLUCAP in the last 168 hours.  Recent Results (from the past 240 hour(s))  Urine culture     Status: None   Collection Time: 06/26/18  9:14 PM  Result Value Ref Range Status   Specimen Description URINE, CLEAN CATCH  Final   Special Requests   Final    NONE Performed at [facility], [address], [place], [place] [address]    Culture   Final    Multiple bacterial morphotypes present, none predominant. Suggest appropriate recollection if clinically indicated.   Report Status 06/28/2018 FINAL  Final     Studies: Ct Abdomen Pelvis Wo Contrast  Result Date: 06/28/2018 CLINICAL DATA:  Vomiting with increased colostomy output. History of ulcerative colitis. EXAM: CT ABDOMEN AND PELVIS WITHOUT CONTRAST TECHNIQUE: Multidetector CT imaging of the abdomen and pelvis was performed following the standard protocol without IV contrast. COMPARISON:  CT abdomen pelvis dated May 12, 2018. FINDINGS: Lower chest: No acute abnormality. Hepatobiliary: Unchanged hepatomegaly with severe hepatic steatosis. No focal liver abnormality. Prior cholecystectomy. No biliary dilatation. Pancreas: Unremarkable. No pancreatic ductal dilatation or surrounding inflammatory changes. Spleen: Normal in size without focal abnormality. Adrenals/Urinary Tract: Adrenal glands are unremarkable. Kidneys are normal, without renal calculi, focal lesion, or hydronephrosis. Bladder is unremarkable. Stomach/Bowel: The stomach is within normal  limits. 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 No bowel wall thickening, distention, or surrounding inflammatory changes. Vascular/Lymphatic: No significant vascular findings are present. No enlarged abdominal or pelvic lymph nodes. Reproductive: Uterus and bilateral adnexa are unremarkable. Other: No abdominal wall hernia or abnormality. No abdominopelvic ascites. No pneumoperitoneum. Musculoskeletal: No acute or significant osseous findings. IMPRESSION: 1.  No acute intra-abdominal process. 2. Unchanged hepatomegaly and severe steatosis. 3. 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 Electronically Signed   By: Titus Dubin M.D.   On: 06/28/2018 22:42    Scheduled Meds: . feeding supplement  1 Container Oral TID BM  . fluvoxaMINE  25 mg Oral QHS  . heparin  5,000 Units Subcutaneous Q8H  . OLANZapine-FLUoxetine  2 capsule Oral QHS  . pantoprazole (PROTONIX) IV  40 mg Intravenous QHS   Continuous Infusions: . sodium chloride    . piperacillin-tazobactam (ZOSYN)  IV 3.375 g (06/29/18 1334)  . sodium chloride      Principal Problem:   Emesis Active Problems:   Ulcerative colitis (HCC)   AKI (acute kidney injury) ([place])   Metabolic acidosis   GERD (gastroesophageal  reflux disease)   Hyponatremia   Hypokalemia   Leukocytosis   Obesity   Depression   Anxiety    Time spent: [place] NP [facility]  If 7PM-7AM, please contact night-coverage at [email or web address], password [facility] 06/29/2018, 3:25 PM  LOS: 0 days

## 2018-06-29 NOTE — Consult Note (Signed)
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  She initially presented with acute nausea, vomiting, and high output ileostomy on December 25th when she was visiting in [place].  The patient was treated conservatively and sent home from the ER.  Her symptoms were mildly improved after her D/C.  On 06/26/2018 she presented to the [facility] ER with persistent symptoms, but she was not severely dehydrated.  Her creatinine was only at 1.25 and her baseline value was at .9.  Her symptoms continued to persist and she represented last evening and her creatinine was now at 3.21.  Overnight she received IV hydration and she is feeling better.  Her diarrhea has mildly improved and she is hemodynamically stable.  Her WBC was increased at 13 and now it increased further to 15.  Past Medical History:  Diagnosis Date  . Anemia   . Anxiety   . Back pain   . Depression   . Fatty liver   . Gallbladder problem   . GERD (gastroesophageal reflux disease)   . Hypertension    Intercranial Hypertension  . IBS (irritable bowel syndrome)   . Joint pain   . Obsessive compulsive disorder 06/14  . Ostomy nurse consultation   . Pancreatitis   . Pancreatitis    hx of   . SOB (shortness of breath)   . UC (ulcerative colitis) [facility])     Past Surgical History:  Procedure Laterality Date  . CHOLECYSTECTOMY    . LAPAROSCOPIC PARTIAL PROTECTOMY    . PERMANENT ILEOSTOMY    . TOTAL COLECTOMY N/A     Family History  Problem Relation Age of Onset  . OCD Mother   . Diabetes Mother   . Depression Mother   . Anxiety disorder Mother   . Obesity Mother   . Obesity Father   . OCD Sister   . Depression Sister   . OCD Brother   . Anxiety disorder  Brother     Social History:  reports that she quit smoking about 9 years ago. Her smoking use included cigarettes and e-cigarettes. She has never used smokeless tobacco. She reports current alcohol use. She reports that she does not use drugs.  Allergies:  Allergies  Allergen Reactions  . Nsaids Other (See Comments)    Cannot have because of her Ulcerative Colitis  . Tolmetin Other (See Comments)    Cannot have because of her Ulcerative Colitis    Medications:  Scheduled: . feeding supplement  1 Container Oral TID BM  . fluvoxaMINE  25 mg Oral QHS  . heparin  5,000 Units Subcutaneous Q8H  . OLANZapine-FLUoxetine  2 capsule Oral QHS  . pantoprazole (PROTONIX) IV  40 mg Intravenous QHS   Continuous: . sodium chloride 150 mL/hr at 06/29/18 0546  . piperacillin-tazobactam (ZOSYN)  IV 3.375 g (06/29/18 0650)  . potassium chloride 10 mEq (06/29/18 0548)  . sodium chloride      Results for orders placed or performed during the hospital encounter of 06/28/18 (from the past 24 hour(s))  Lipase, blood     Status: None   Collection Time: 06/28/18  3:55 PM  Result Value Ref Range   Lipase 35 11 - 51 U/L  Comprehensive metabolic panel  Status: Abnormal   Collection Time: 06/28/18  3:55 PM  Result Value Ref Range   Sodium 132 (L) 135 - 145 mmol/L   Potassium 3.4 (L) 3.5 - 5.1 mmol/L   Chloride 94 (L) 98 - 111 mmol/L   CO2 17 (L) 22 - 32 mmol/L   Glucose, Bld 198 (H) 70 - 99 mg/dL   BUN 23 (H) 6 - 20 mg/dL   Creatinine, Ser 3.21 (H) 0.44 - 1.00 mg/dL   Calcium 10.1 8.9 - 10.3 mg/dL   Total Protein 10.8 (H) 6.5 - 8.1 g/dL   Albumin 5.6 (H) 3.5 - 5.0 g/dL   AST 59 (H) 15 - 41 U/L   ALT 63 (H) 0 - 44 U/L   Alkaline Phosphatase 194 (H) 38 - 126 U/L   Total Bilirubin 0.6 0.3 - 1.2 mg/dL   GFR calc non Af Amer 19 (L) >60 mL/min   GFR calc Af Amer 22 (L) >60 mL/min   Anion gap 21 (H) 5 - 15  CBC     Status: Abnormal   Collection Time: 06/28/18  3:55 PM  Result Value Ref Range    WBC 13.7 (H) 4.0 - 10.5 K/uL   RBC 6.82 (H) 3.87 - 5.11 MIL/uL   Hemoglobin 17.8 (H) 12.0 - 15.0 g/dL   HCT 55.3 (H) 36.0 - 46.0 %   MCV 81.1 80.0 - 100.0 fL   MCH 26.1 26.0 - 34.0 pg   MCHC 32.2 30.0 - 36.0 g/dL   RDW 14.2 11.5 - 15.5 %   Platelets 618 (H) 150 - 400 K/uL   nRBC 0.0 0.0 - 0.2 %  I-Stat beta hCG blood, ED     Status: None   Collection Time: 06/28/18  4:12 PM  Result Value Ref Range   I-stat hCG, quantitative <5.0 <5 mIU/mL   Comment 3          Magnesium     Status: None   Collection Time: 06/28/18 11:15 PM  Result Value Ref Range   Magnesium 2.1 1.7 - 2.4 mg/dL  Phosphorus     Status: None   Collection Time: 06/28/18 11:15 PM  Result Value Ref Range   Phosphorus 3.7 2.5 - 4.6 mg/dL  Lactic acid, plasma     Status: None   Collection Time: 06/28/18 11:48 PM  Result Value Ref Range   Lactic Acid, Venous 1.4 0.5 - 1.9 mmol/L  Urinalysis, Routine w reflex microscopic     Status: Abnormal   Collection Time: 06/29/18  1:55 AM  Result Value Ref Range   Color, Urine YELLOW YELLOW   APPearance CLOUDY (A) CLEAR   Specific Gravity, Urine 1.021 1.005 - 1.030   pH 6.0 5.0 - 8.0   Glucose, UA NEGATIVE NEGATIVE mg/dL   Hgb urine dipstick LARGE (A) NEGATIVE   Bilirubin Urine NEGATIVE NEGATIVE   Ketones, ur 5 (A) NEGATIVE mg/dL   Protein, ur 100 (A) NEGATIVE mg/dL   Nitrite NEGATIVE NEGATIVE   Leukocytes, UA NEGATIVE NEGATIVE  Urinalysis, Microscopic (reflex)     Status: Abnormal   Collection Time: 06/29/18  1:55 AM  Result Value Ref Range   RBC / HPF 11-20 0 - 5 RBC/hpf   WBC, UA 0-5 0 - 5 WBC/hpf   Bacteria, UA FEW (A) NONE SEEN   Squamous Epithelial / LPF 6-10 0 - 5   Mucus PRESENT    Hyaline Casts, UA PRESENT   CBC     Status: Abnormal   Collection Time: 06/29/18  3:09 AM  Result Value Ref Range   WBC 15.6 (H) 4.0 - 10.5 K/uL   RBC 5.61 (H) 3.87 - 5.11 MIL/uL   Hemoglobin 14.6 12.0 - 15.0 g/dL   HCT 44.4 36.0 - 46.0 %   MCV 79.1 (L) 80.0 - 100.0 fL   MCH  26.0 26.0 - 34.0 pg   MCHC 32.9 30.0 - 36.0 g/dL   RDW 13.4 11.5 - 15.5 %   Platelets 440 (H) 150 - 400 K/uL   nRBC 0.0 0.0 - 0.2 %  Basic metabolic panel     Status: Abnormal   Collection Time: 06/29/18  3:09 AM  Result Value Ref Range   Sodium 132 (L) 135 - 145 mmol/L   Potassium 3.2 (L) 3.5 - 5.1 mmol/L   Chloride 103 98 - 111 mmol/L   CO2 15 (L) 22 - 32 mmol/L   Glucose, Bld 138 (H) 70 - 99 mg/dL   BUN 20 6 - 20 mg/dL   Creatinine, Ser 1.61 (H) 0.44 - 1.00 mg/dL   Calcium 8.8 (L) 8.9 - 10.3 mg/dL   GFR calc non Af Amer 43 (L) >60 mL/min   GFR calc Af Amer 50 (L) >60 mL/min   Anion gap 14 5 - 15  Hepatic function panel     Status: Abnormal   Collection Time: 06/29/18  3:09 AM  Result Value Ref Range   Total Protein 8.0 6.5 - 8.1 g/dL   Albumin 4.2 3.5 - 5.0 g/dL   AST 35 15 - 41 U/L   ALT 45 (H) 0 - 44 U/L   Alkaline Phosphatase 152 (H) 38 - 126 U/L   Total Bilirubin 0.5 0.3 - 1.2 mg/dL   Bilirubin, Direct 0.1 0.0 - 0.2 mg/dL   Indirect Bilirubin 0.4 0.3 - 0.9 mg/dL     Ct Abdomen Pelvis Wo Contrast  Result Date: 06/28/2018 CLINICAL DATA:  Vomiting with increased colostomy output. History of ulcerative colitis. EXAM: CT ABDOMEN AND PELVIS WITHOUT CONTRAST TECHNIQUE: Multidetector CT imaging of the abdomen and pelvis was performed following the standard protocol without IV contrast. COMPARISON:  CT abdomen pelvis dated May 12, 2018. FINDINGS: Lower chest: No acute abnormality. Hepatobiliary: Unchanged hepatomegaly with severe hepatic steatosis. No focal liver abnormality. Prior cholecystectomy. No biliary dilatation. Pancreas: Unremarkable. No pancreatic ductal dilatation or surrounding inflammatory changes. Spleen: Normal in size without focal abnormality. Adrenals/Urinary Tract: Adrenal glands are unremarkable. Kidneys are normal, without renal calculi, focal lesion, or hydronephrosis. Bladder is unremarkable. Stomach/Bowel: The stomach is within normal limits. Prior total  colectomy with right mid abdominal ileostomy again noted. No bowel wall thickening, distention, or surrounding inflammatory changes. Vascular/Lymphatic: No significant vascular findings are present. No enlarged abdominal or pelvic lymph nodes. Reproductive: Uterus and bilateral adnexa are unremarkable. Other: No abdominal wall hernia or abnormality. No abdominopelvic ascites. No pneumoperitoneum. Musculoskeletal: No acute or significant osseous findings. IMPRESSION: 1.  No acute intra-abdominal process. 2. Unchanged hepatomegaly and severe steatosis. 3. 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 Electronically Signed   By: Titus Dubin M.D.   On: 06/28/2018 22:42    ROS:  As stated above in the HPI otherwise negative.  Blood pressure 122/79, pulse 97, temperature 98.6 F (37 C), temperature source Oral, resp. rate 16, height 5' 2"  (1.575 m), weight 106.6 kg, SpO2 100 %.    PE: Gen: NAD, Alert and Oriented HEENT:  [place]/AT, EOMI Neck: Supple, no LAD Lungs: CTA Bilaterally CV: RRR without M/G/R ABM: Soft, some tenderness in  the RUQ, +BS Ext: No C/C/E  Assessment/Plan: 1) Diarrhea. 2) Dehydration. 3) ARF - Improved/resolved. 4) Hepatic steatosis.   Her clinical presentation is consistent with a viral source.  She had a CT scan here in the ER and also in [place] without any acute findings to suggest a source for her symptoms.  This morning she is better.  Plan: 1) Continue supportive care with IV hydration. 2) Okay with Zosyn, for now. 3) Advance diet as tolerated. 4) A GI pathogen panel can be performed if she is not improving clinically.  Chistina Roston D 06/29/2018, 8:27 AM

## 2018-06-29 NOTE — Progress Notes (Signed)
Patient arrived to unit from ED. Significant other at bedside. Patient complains of right sided abdominal and flank pain. A&Ox4.

## 2018-06-29 NOTE — Consult Note (Signed)
[place] nurse consulted for ileostomy care. Patient with ileostomy since 2016, she is independent in her care. Will assist with ordered needed supplies if patient wishes to use [facility] formulary items.   [place], [place], [place]

## 2018-06-29 NOTE — Progress Notes (Signed)
On call provider contacted about patients HR jumping to 160s on exertion. At rest, HR is around 109-112. Patient states when she got up to go to the bathroom, she felt weak, lightheaded & dizzy. Bolus order &  advised to implement orthostatic precautions (BSC, sit before getting straight up) Will continue to monitor.

## 2018-06-29 NOTE — Telephone Encounter (Signed)
Called patients husband back and left voicemail with the suggestion from Dr. Adele Schilder - told patient to call me back with any questions

## 2018-06-30 DIAGNOSIS — R197 Diarrhea, unspecified: Secondary | ICD-10-CM | POA: Diagnosis not present

## 2018-06-30 DIAGNOSIS — E876 Hypokalemia: Secondary | ICD-10-CM | POA: Diagnosis not present

## 2018-06-30 DIAGNOSIS — R112 Nausea with vomiting, unspecified: Secondary | ICD-10-CM | POA: Diagnosis not present

## 2018-06-30 DIAGNOSIS — R110 Nausea: Secondary | ICD-10-CM | POA: Diagnosis not present

## 2018-06-30 DIAGNOSIS — R000 Tachycardia, unspecified: Secondary | ICD-10-CM | POA: Diagnosis not present

## 2018-06-30 DIAGNOSIS — Z432 Encounter for attention to ileostomy: Secondary | ICD-10-CM | POA: Diagnosis not present

## 2018-06-30 DIAGNOSIS — N179 Acute kidney failure, unspecified: Secondary | ICD-10-CM | POA: Diagnosis not present

## 2018-06-30 DIAGNOSIS — R1110 Vomiting, unspecified: Secondary | ICD-10-CM | POA: Diagnosis not present

## 2018-06-30 DIAGNOSIS — F329 Major depressive disorder, single episode, unspecified: Secondary | ICD-10-CM | POA: Diagnosis not present

## 2018-06-30 DIAGNOSIS — F419 Anxiety disorder, unspecified: Secondary | ICD-10-CM | POA: Diagnosis not present

## 2018-06-30 LAB — CBC
HCT: 43.6 % (ref 36.0–46.0)
Hemoglobin: 13.8 g/dL (ref 12.0–15.0)
MCH: 25.9 pg — ABNORMAL LOW (ref 26.0–34.0)
MCHC: 31.7 g/dL (ref 30.0–36.0)
MCV: 82 fL (ref 80.0–100.0)
Platelets: 381 10*3/uL (ref 150–400)
RBC: 5.32 MIL/uL — ABNORMAL HIGH (ref 3.87–5.11)
RDW: 13.7 % (ref 11.5–15.5)
WBC: 11.5 10*3/uL — ABNORMAL HIGH (ref 4.0–10.5)
nRBC: 0 % (ref 0.0–0.2)

## 2018-06-30 LAB — GASTROINTESTINAL PANEL BY PCR, STOOL (REPLACES STOOL CULTURE)

## 2018-06-30 LAB — BASIC METABOLIC PANEL
Anion gap: 14 (ref 5–15)
BUN: 10 mg/dL (ref 6–20)
CO2: 17 mmol/L — ABNORMAL LOW (ref 22–32)
Calcium: 9 mg/dL (ref 8.9–10.3)
Chloride: 102 mmol/L (ref 98–111)
Creatinine, Ser: 1.14 mg/dL — ABNORMAL HIGH (ref 0.44–1.00)
GFR calc Af Amer: >60 mL/min (ref >60)
GFR calc non Af Amer: >60 mL/min (ref >60)
Glucose, Bld: 92 mg/dL (ref 70–99)
Potassium: 3.4 mmol/L — ABNORMAL LOW (ref 3.5–5.1)
Sodium: 133 mmol/L — ABNORMAL LOW (ref 135–145)

## 2018-06-30 LAB — MAGNESIUM: Magnesium: 2.3 mg/dL (ref 1.7–2.4)

## 2018-06-30 MED ORDER — OXYCODONE-ACETAMINOPHEN 5-325 MG PO TABS
1.0000 | ORAL_TABLET | ORAL | Status: DC | PRN
  Administered 2018-06-30 – 2018-07-01 (×6): 1 via ORAL
  Filled 2018-06-30 (×6): qty 1

## 2018-06-30 MED ORDER — ONDANSETRON HCL 4 MG/2ML IJ SOLN
4.0000 mg | Freq: Four times a day (QID) | INTRAMUSCULAR | Status: DC | PRN
  Administered 2018-06-30 – 2018-07-01 (×4): 4 mg via INTRAVENOUS
  Filled 2018-06-30 (×4): qty 2

## 2018-06-30 MED ORDER — SODIUM CHLORIDE 0.9 % IV BOLUS
250.0000 mL | Freq: Once | INTRAVENOUS | Status: AC
  Administered 2018-06-30: 250 mL via INTRAVENOUS

## 2018-06-30 MED ORDER — MORPHINE SULFATE (PF) 2 MG/ML IV SOLN
2.0000 mg | INTRAVENOUS | Status: DC | PRN
  Administered 2018-06-30 – 2018-07-01 (×6): 2 mg via INTRAVENOUS
  Filled 2018-06-30 (×6): qty 1

## 2018-06-30 MED ORDER — SODIUM CHLORIDE 0.9 % IV SOLN: INTRAVENOUS | Status: AC

## 2018-06-30 MED ORDER — POTASSIUM CHLORIDE CRYS ER 20 MEQ PO TBCR
40.0000 meq | EXTENDED_RELEASE_TABLET | ORAL | Status: AC
  Administered 2018-06-30 (×2): 40 meq via ORAL
  Filled 2018-06-30 (×2): qty 2

## 2018-06-30 NOTE — Progress Notes (Signed)
Pt HR was 150 for about 1-2 minutes at rest  MD notified.

## 2018-06-30 NOTE — Consult Note (Signed)
Diana Page ROUNDING NOTE   Subjective: No acute events overnight.  Still with high ostomy output.  Nausea but no emesis and tolerating p.o. intake (clears).  Does endorse abdominal discomfort but otherwise no complaints at this time.   Objective: Vital signs in last 24 hours: Temp:  [97.7 F (36.5 C)-98.3 F (36.8 C)] 98.2 F (36.8 C) (01/04 1401) Pulse Rate:  [76-114] 114 (01/04 1401) Resp:  [18] 18 (01/04 1401) BP: (111-119)/(72-84) 119/72 (01/04 1401) SpO2:  [96 %-99 %] 98 % (01/04 1401) Last BM Date: 06/30/18 General: NAD Lungs: CTA bilaterally, no wheezes, rales, rhonchi Heart: RRR, no murmurs Abdomen: Soft, minimally tender without rebound or guarding, ostomy in place, positive bowel sounds Ext: No clubbing, cyanosis, edema    Intake/Output from previous day: 01/03 0701 - 01/04 0700 In: 2650 [P.O.:720; I.V.:1687.7; IV Piggyback:242.2] Out: 3225 [Stool:3225] Intake/Output this shift: Total I/O In: 480 [P.O.:480] Out: 1400 [Stool:1400]   Lab Results: Recent Labs    06/28/18 1555 06/29/18 0309 06/30/18 0246  WBC 13.7* 15.6* 11.5*  HGB 17.8* 14.6 13.8  PLT 618* 440* 381  MCV 81.1 79.1* 82.0   BMET Recent Labs    06/28/18 1555 06/29/18 0309 06/30/18 0246  NA 132* 132* 133*  K 3.4* 3.2* 3.4*  CL 94* 103 102  CO2 17* 15* 17*  GLUCOSE 198* 138* 92  BUN 23* 20 10  CREATININE 3.21* 1.61* 1.14*  CALCIUM 10.1 8.8* 9.0   LFT Recent Labs    06/28/18 1555 06/29/18 0309  PROT 10.8* 8.0  ALBUMIN 5.6* 4.2  AST 59* 35  ALT 63* 45*  ALKPHOS 194* 152*  BILITOT 0.6 0.5  BILIDIR  --  0.1  IBILI  --  0.4   PT/INR No results for input(s): INR in the last 72 hours.    Imaging/Other results: Ct Abdomen Pelvis Wo Contrast  Result Date: 06/28/2018 CLINICAL DATA:  Vomiting with increased colostomy output. History of ulcerative colitis. EXAM: CT ABDOMEN AND PELVIS WITHOUT CONTRAST TECHNIQUE: Multidetector CT imaging of the abdomen and pelvis was  performed following the standard protocol without IV contrast. COMPARISON:  CT abdomen pelvis dated May 12, 2018. FINDINGS: Lower chest: No acute abnormality. Hepatobiliary: Unchanged hepatomegaly with severe hepatic steatosis. No focal liver abnormality. Prior cholecystectomy. No biliary dilatation. Pancreas: Unremarkable. No pancreatic ductal dilatation or surrounding inflammatory changes. Spleen: Normal in size without focal abnormality. Adrenals/Urinary Tract: Adrenal glands are unremarkable. Kidneys are normal, without renal calculi, focal lesion, or hydronephrosis. Bladder is unremarkable. Stomach/Bowel: The stomach is within normal limits. 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 No bowel wall thickening, distention, or surrounding inflammatory changes. Vascular/Lymphatic: No significant vascular findings are present. No enlarged abdominal or pelvic lymph nodes. Reproductive: Uterus and bilateral adnexa are unremarkable. Other: No abdominal wall hernia or abnormality. No abdominopelvic ascites. No pneumoperitoneum. Musculoskeletal: No acute or significant osseous findings. IMPRESSION: 1.  No acute intra-abdominal process. 2. Unchanged hepatomegaly and severe steatosis. 3. 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 Electronically Signed   By: Titus Dubin M.D.   On: 06/28/2018 22:42      Assessment and Plan:  1) Diarrhea/high ostomy output 2) Nausea 3) Acute kidney injury-improving 4) Hepatic steatosis  Has improved a bit clinically in the last 24 hours or so but still with increased liquid ostomy output.  Is asking for advancement in diet which is a good sign.  Otherwise, improved renal function with creatinine 1.14.  - Resume IV fluids as you are doing -  Okay to advance diet -Agree with stopping antibiotics at this point - Send GI path panel - Antiemetics as needed as you are doing - We will sign off at this time.  Please do not hesitate  to contact the on-call GI with additional questions or concerns.  Patient can follow-up with Dr. Benson Norway as an outpatient.    Diana Bullion, DO  06/30/2018, 2:57 PM [place] [facility] Pager [phone]

## 2018-06-30 NOTE — Progress Notes (Signed)
[facility] PROGRESS NOTE  Diana Page TUU:[id number] DOB: 18-Jun-1991 DOA: 06/28/2018 PCP: Patient, No Pcp Per  Assessment/Plan: Emesis,increased ileostomy output History ofulcerative colitiss/p TAC andendileostomy,proctectomy.CT abdomen pelvis showing hepatomegaly and severe steatosis; no acute process. Nausea improved requesting reg diet. Pain only slightly improved. Continues with copious amounts of output ileostomy.  WBC tending down. Afebrile and non-toxic appearing.  evaluated by GI who agree with current plan  -Continue IV fluid hydration -continue Zosyn to cover for possible intraabdominal source of infection  -bmet in am -IV Phenergan PRN  -IV morphine 1 mg every 2 hours as needed forseverepain -advance diet -Ostomy care -intake and output - obtain gi pathogen panel  AKI Likely secondary to dehydration. Creatinine 3.2 on admission; baseline 1.7-2.9. improved to 1. 14 with IV fluids. -Continue IV fluid -Continue to monitor BMP -Monitor urine output -Avoid nephrotoxic agents/contrast  Metabolic acidosis. Related to dehydration and increased ostomy output. Improving.  Given bicarb on admission. -see above -consider bicarb supplement -bmet in am  Hypokalemia/hyponatremia: related to above. Improving but still below normal limit -iv fluids -repleate -will recheck -check magnesium level  Tachycardia: etiology unclear. Brief episode. Likely related to above -check mag level -replete potassium as noted above -check tsh -ekg -bolus IV fluids -monitor closely  Depression -Continue fluvoxamine, symbyax  Anxiety -Continue home Ativan PRN  Code Status: full Family Communication: none present Disposition Plan: home when ready   Consultants:  [facility] [place] gi  Procedures:    Antibiotics:  Zosyn 06/28/18-06/29/18  HPI/Subjective: Presented with acute nausea/vomiting/high ileostomy output, dehydration and acute kidney injury.  Feeling better  today but continues with high ostomy output. Episodes of tachycardia  Objective: Vitals:   06/30/18 1140 06/30/18 1401  BP:  119/72  Pulse:  (!) 114  Resp:  18  Temp: 98.2 F (36.8 C) 98.2 F (36.8 C)  SpO2:  98%    Intake/Output Summary (Last 24 hours) at 06/30/2018 1430 Last data filed at 06/30/2018 1300 Gross per 24 hour  Intake 2649.96 ml  Output 3725 ml  Net -1075.04 ml   Filed Weights   06/28/18 1454  Weight: 106.6 kg    Exam:   General:  Sitting up in bed in no acute distress face flushed  Cardiovascular: rrr no mgr no LE edema  Respiratory: normal effort BS clear bilaterally no wheeze  Abdomen: obese soft +BS ileostomy with small amount watery greenish stool  Musculoskeletal: no swelling/erythema. Full rom.    Data Reviewed: Basic Metabolic Panel: Recent Labs  Lab 06/26/18 1445 06/28/18 1555 06/28/18 2315 06/29/18 0309 06/30/18 0246  NA 136 132*  --  132* 133*  K 3.8 3.4*  --  3.2* 3.4*  CL 107 94*  --  103 102  CO2 14* 17*  --  15* 17*  GLUCOSE 118* 198*  --  138* 92  BUN 14 23*  --  20 10  CREATININE 1.25* 3.21*  --  1.61* 1.14*  CALCIUM 9.3 10.1  --  8.8* 9.0  MG  --   --  2.1  --   --   PHOS  --   --  3.7  --   --    Liver Function Tests: Recent Labs  Lab 06/26/18 1445 06/28/18 1555 06/29/18 0309  AST 38 59* 35  ALT 46* 63* 45*  ALKPHOS 151* 194* 152*  BILITOT 0.3 0.6 0.5  PROT 8.8* 10.8* 8.0  ALBUMIN 4.6 5.6* 4.2   Recent Labs  Lab 06/26/18 1445 06/28/18 1555  LIPASE 27 35  No results for input(s): AMMONIA in the last 168 hours. CBC: Recent Labs  Lab 06/26/18 1445 06/28/18 1555 06/29/18 0309 06/30/18 0246  WBC 9.2 13.7* 15.6* 11.5*  HGB 15.5* 17.8* 14.6 13.8  HCT 49.4* 55.3* 44.4 43.6  MCV 86.5 81.1 79.1* 82.0  PLT 384 618* 440* 381   Cardiac Enzymes: No results for input(s): CKTOTAL, CKMB, CKMBINDEX, TROPONINI in the last 168 hours. BNP (last 3 results) No results for input(s): BNP in the last 8760  hours.  ProBNP (last 3 results) No results for input(s): PROBNP in the last 8760 hours.  CBG: No results for input(s): GLUCAP in the last 168 hours.  Recent Results (from the past 240 hour(s))  Urine culture     Status: None   Collection Time: 06/26/18  9:14 PM  Result Value Ref Range Status   Specimen Description URINE, CLEAN CATCH  Final   Special Requests   Final    NONE Performed at [facility], [address], [place], [place] [address]    Culture   Final    Multiple bacterial morphotypes present, none predominant. Suggest appropriate recollection if clinically indicated.   Report Status 06/28/2018 FINAL  Final     Studies: Ct Abdomen Pelvis Wo Contrast  Result Date: 06/28/2018 CLINICAL DATA:  Vomiting with increased colostomy output. History of ulcerative colitis. EXAM: CT ABDOMEN AND PELVIS WITHOUT CONTRAST TECHNIQUE: Multidetector CT imaging of the abdomen and pelvis was performed following the standard protocol without IV contrast. COMPARISON:  CT abdomen pelvis dated May 12, 2018. FINDINGS: Lower chest: No acute abnormality. Hepatobiliary: Unchanged hepatomegaly with severe hepatic steatosis. No focal liver abnormality. Prior cholecystectomy. No biliary dilatation. Pancreas: Unremarkable. No pancreatic ductal dilatation or surrounding inflammatory changes. Spleen: Normal in size without focal abnormality. Adrenals/Urinary Tract: Adrenal glands are unremarkable. Kidneys are normal, without renal calculi, focal lesion, or hydronephrosis. Bladder is unremarkable. Stomach/Bowel: The stomach is within normal limits. 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 No bowel wall thickening, distention, or surrounding inflammatory changes. Vascular/Lymphatic: No significant vascular findings are present. No enlarged abdominal or pelvic lymph nodes. Reproductive: Uterus and bilateral adnexa are unremarkable. Other: No abdominal wall hernia or abnormality. No  abdominopelvic ascites. No pneumoperitoneum. Musculoskeletal: No acute or significant osseous findings. IMPRESSION: 1.  No acute intra-abdominal process. 2. Unchanged hepatomegaly and severe steatosis. 3. 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 Electronically Signed   By: Titus Dubin M.D.   On: 06/28/2018 22:42    Scheduled Meds: . feeding supplement  1 Container Oral TID BM  . fluvoxaMINE  25 mg Oral QHS  . heparin  5,000 Units Subcutaneous Q8H  . OLANZapine-FLUoxetine  2 capsule Oral QHS  . pantoprazole (PROTONIX) IV  40 mg Intravenous QHS  . potassium chloride  40 mEq Oral Q4H   Continuous Infusions: . sodium chloride    . sodium chloride 250 mL (06/30/18 1427)  . sodium chloride      Principal Problem:   Emesis Active Problems:   Ulcerative colitis (HCC)   AKI (acute kidney injury) (HCC)   Metabolic acidosis   GERD (gastroesophageal reflux disease)   Hyponatremia   Hypokalemia   Leukocytosis   Obesity   Depression   Anxiety   IIH (idiopathic intracranial hypertension)    Time spent: 84 minutes    [place] NP [facility]  If 7PM-7AM, please contact night-coverage at [email or web address], password [facility] 06/30/2018, 2:30 PM  LOS: 0 days

## 2018-07-01 DIAGNOSIS — R197 Diarrhea, unspecified: Secondary | ICD-10-CM | POA: Diagnosis not present

## 2018-07-01 DIAGNOSIS — E871 Hypo-osmolality and hyponatremia: Secondary | ICD-10-CM

## 2018-07-01 DIAGNOSIS — R112 Nausea with vomiting, unspecified: Secondary | ICD-10-CM | POA: Diagnosis not present

## 2018-07-01 DIAGNOSIS — N179 Acute kidney failure, unspecified: Secondary | ICD-10-CM | POA: Diagnosis not present

## 2018-07-01 LAB — CBC
HCT: 42 % (ref 36.0–46.0)
Hemoglobin: 13.7 g/dL (ref 12.0–15.0)
MCH: 27 pg (ref 26.0–34.0)
MCHC: 32.6 g/dL (ref 30.0–36.0)
MCV: 82.8 fL (ref 80.0–100.0)
Platelets: 350 10*3/uL (ref 150–400)
RBC: 5.07 MIL/uL (ref 3.87–5.11)
RDW: 13.7 % (ref 11.5–15.5)
WBC: 13.6 10*3/uL — ABNORMAL HIGH (ref 4.0–10.5)
nRBC: 0 % (ref 0.0–0.2)

## 2018-07-01 LAB — COMPREHENSIVE METABOLIC PANEL
ALT: 45 U/L — ABNORMAL HIGH (ref 0–44)
AST: 39 U/L (ref 15–41)
Albumin: 3.9 g/dL (ref 3.5–5.0)
Alkaline Phosphatase: 138 U/L — ABNORMAL HIGH (ref 38–126)
Anion gap: 10 (ref 5–15)
BUN: 10 mg/dL (ref 6–20)
CO2: 21 mmol/L — ABNORMAL LOW (ref 22–32)
Calcium: 8.9 mg/dL (ref 8.9–10.3)
Chloride: 102 mmol/L (ref 98–111)
Creatinine, Ser: 1.01 mg/dL — ABNORMAL HIGH (ref 0.44–1.00)
GFR calc Af Amer: >60 mL/min (ref >60)
GFR calc non Af Amer: >60 mL/min (ref >60)
Glucose, Bld: 100 mg/dL — ABNORMAL HIGH (ref 70–99)
Potassium: 3.2 mmol/L — ABNORMAL LOW (ref 3.5–5.1)
Sodium: 133 mmol/L — ABNORMAL LOW (ref 135–145)
Total Bilirubin: 0.5 mg/dL (ref 0.3–1.2)
Total Protein: 7.7 g/dL (ref 6.5–8.1)

## 2018-07-01 LAB — TSH: TSH: 1.914 u[IU]/mL (ref 0.350–4.500)

## 2018-07-01 MED ORDER — PANTOPRAZOLE SODIUM 40 MG PO TBEC: 40.0000 mg | DELAYED_RELEASE_TABLET | Freq: Every day | ORAL | Status: DC

## 2018-07-01 MED ORDER — ACETAZOLAMIDE ER 500 MG PO CP12
500.0000 mg | ORAL_CAPSULE | Freq: Three times a day (TID) | ORAL | Status: DC
  Administered 2018-07-01: 500 mg via ORAL
  Filled 2018-07-01 (×3): qty 1

## 2018-07-01 MED ORDER — POTASSIUM CHLORIDE CRYS ER 20 MEQ PO TBCR
40.0000 meq | EXTENDED_RELEASE_TABLET | Freq: Once | ORAL | Status: AC
  Administered 2018-07-01: 40 meq via ORAL
  Filled 2018-07-01: qty 2

## 2018-07-01 NOTE — Discharge Summary (Signed)
Physician Discharge Summary  [name] HQR:[id number] DOB: July 22, 1990 DOA: 06/28/2018  PCP: Diana Page, No Pcp Per  Admit date: 06/28/2018 Discharge date: 07/01/2018  Time spent: 45 minutes  Recommendations for Outpatient Follow-up:  1.  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 Provided with zosyn for 2 days and bowel rest. Improved at discharge.  Hospital Course:  Emesis,increased ileostomy output History ofulcerative colitiss/p TAC andendileostomy,proctectomy.CT abdomen pelvis showing hepatomegaly and severe steatosis; no acute process. tolerating regular diet at discharge.  Pain improved. Ileostomy output much less.  WBC tending down. Afebrile and non-toxic appearing.evaluated by GI who agreed with hydration and supportive car. gi pathogen panel with adenovirous  AKI Likely secondary to dehydration. Creatinine 3.2 on admission; baseline 1.7-2.9. improved to 1. 01 with IV fluids.  Metabolic acidosis. Related to dehydration and increased ostomy output. Resolved at discharge  Hypokalemia/hyponatremia: low end of normal at discharge. Appetite improving and output less. Recommend follow up with PCP 1-2 weeks for monitoring.   Tachycardia: Brief episode. Resolved at  discharge. Likely related to above  Depression -stable at baseline  Anxiety -stable at baseline  Procedures:    Consultations:  Zosyn 06/28/18>>06/29/18  Discharge Exam: Vitals:   07/01/18 0500 07/01/18 0843  BP: 108/83 120/70  Pulse: 94 (!) 105  Resp:  20  Temp: 97.9 F (36.6 C) 98 F (36.7 C)  SpO2: 95% 100%    General: well nourished no acute distress Cardiovascular: rrr no mgr no LE edema Respiratory: normal effort bs clear bilaterally  Discharge Instructions   Discharge Instructions    Diet - low sodium heart healthy   Complete by:  As directed    Discharge instructions   Complete by:  As directed    Follow up with PCP 1-2 weeks for evaluation of urinary retention as well as monitoring of electrolytes   Increase activity slowly   Complete by:  As directed      Allergies as of 07/01/2018      Reactions   Nsaids Other (See Comments)   Cannot have because of her Ulcerative Colitis   Tolmetin Other (See Comments)   Cannot have because of her Ulcerative Colitis      Medication List    TAKE these medications   acetaminophen 500 MG tablet Commonly known as:  TYLENOL Take 500-1,000 mg by mouth every 6 (six) hours as needed (for pain).   acetaZOLAMIDE 500 MG capsule Commonly known as:  DIAMOX TAKE 1 CAPSULE (500 MG TOTAL) BY MOUTH 3 (THREE) TIMES DAILY.   calcium carbonate 500 MG chewable tablet Commonly known as:  TUMS - dosed in mg elemental calcium Chew 1-2 tablets by mouth as needed for indigestion or heartburn.   Drainable Fecal Collector Misc by Does not apply route.   fluvoxaMINE 25 MG tablet Commonly  known as:  LUVOX Take 1 tablet (25 mg total) by mouth at bedtime.   HYDROcodone-acetaminophen 5-325 MG tablet Commonly known as:  NORCO/VICODIN Take 2 tablets by mouth every 4 (four) hours as needed. What changed:  reasons to take this   LORazepam 0.5 MG tablet Commonly known as:  ATIVAN Take 1 tablet (0.5 mg total) by mouth daily as  needed for anxiety.   OLANZapine-FLUoxetine 6-50 MG capsule Commonly known as:  SYMBYAX Take 1 capsule by mouth at bedtime.   ondansetron 4 MG disintegrating tablet Commonly known as:  ZOFRAN ODT Take 1 tablet (4 mg total) by mouth every 8 (eight) hours as needed for nausea or vomiting.   Ostomy Supplies Misc by Does not apply route.   promethazine 25 MG tablet Commonly known as:  PHENERGAN Take 1 tablet (25 mg total) by mouth every 6 (six) hours as needed for nausea or vomiting.      Allergies  Allergen Reactions  . Nsaids Other (See Comments)    Cannot have because of her Ulcerative Colitis  . Tolmetin Other (See Comments)    Cannot have because of her Ulcerative Colitis      The results of significant diagnostics from this hospitalization (including imaging, microbiology, ancillary and laboratory) are listed below for reference.    Significant Diagnostic Studies: Ct Abdomen Pelvis Wo Contrast  Result Date: 06/28/2018 CLINICAL DATA:  Vomiting with increased colostomy output. History of ulcerative colitis. EXAM: CT ABDOMEN AND PELVIS WITHOUT CONTRAST TECHNIQUE: Multidetector CT imaging of the abdomen and pelvis was performed following the standard protocol without IV contrast. COMPARISON:  CT abdomen pelvis dated May 12, 2018. FINDINGS: Lower chest: No acute abnormality. Hepatobiliary: Unchanged hepatomegaly with severe hepatic steatosis. No focal liver abnormality. Prior cholecystectomy. No biliary dilatation. Pancreas: Unremarkable. No pancreatic ductal dilatation or surrounding inflammatory changes. Spleen: Normal in size without focal abnormality. Adrenals/Urinary Tract: Adrenal glands are unremarkable. Kidneys are normal, without renal calculi, focal lesion, or hydronephrosis. Bladder is unremarkable. Stomach/Bowel: The stomach is within normal limits. 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 No bowel wall thickening, distention, or surrounding  inflammatory changes. Vascular/Lymphatic: No significant vascular findings are present. No enlarged abdominal or pelvic lymph nodes. Reproductive: Uterus and bilateral adnexa are unremarkable. Other: No abdominal wall hernia or abnormality. No abdominopelvic ascites. No pneumoperitoneum. Musculoskeletal: No acute or significant osseous findings. IMPRESSION: 1.  No acute intra-abdominal process. 2. Unchanged hepatomegaly and severe steatosis. 3. 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 Electronically Signed   By: Titus Dubin M.D.   On: 06/28/2018 22:42    Microbiology: Recent Results (from the past 240 hour(s))  Urine culture     Status: None   Collection Time: 06/26/18  9:14 PM  Result Value Ref Range Status   Specimen Description URINE, CLEAN CATCH  Final   Special Requests   Final    NONE Performed at [facility], [address], [place], [place] [address]    Culture   Final    Multiple bacterial morphotypes present, none predominant. Suggest appropriate recollection if clinically indicated.   Report Status 06/28/2018 FINAL  Final  Gastrointestinal Panel by PCR , Stool     Status: Abnormal   Collection Time: 06/30/18  9:35 AM  Result Value Ref Range Status   Campylobacter species NOT DETECTED NOT DETECTED Final   Plesimonas shigelloides NOT DETECTED NOT DETECTED Final   Salmonella species NOT DETECTED NOT DETECTED Final   Yersinia enterocolitica NOT DETECTED  NOT DETECTED Final   Vibrio species NOT DETECTED NOT DETECTED Final   Vibrio cholerae NOT DETECTED NOT DETECTED Final   Enteroaggregative E coli (EAEC) NOT DETECTED NOT DETECTED Final   Enteropathogenic E coli (EPEC) NOT DETECTED NOT DETECTED Final   Enterotoxigenic E coli (ETEC) NOT DETECTED NOT DETECTED Final   Shiga like toxin producing E coli (STEC) NOT DETECTED NOT DETECTED Final   Shigella/Enteroinvasive E coli (EIEC) NOT DETECTED NOT DETECTED Final   Cryptosporidium NOT DETECTED NOT DETECTED  Final   Cyclospora cayetanensis NOT DETECTED NOT DETECTED Final   Entamoeba histolytica NOT DETECTED NOT DETECTED Final   Giardia lamblia NOT DETECTED NOT DETECTED Final   Adenovirus F40/41 DETECTED (A) NOT DETECTED Final   Astrovirus NOT DETECTED NOT DETECTED Final   Norovirus GI/GII NOT DETECTED NOT DETECTED Final   Rotavirus A NOT DETECTED NOT DETECTED Final   Sapovirus (I, II, IV, and V) NOT DETECTED NOT DETECTED Final    Comment: Performed at [facility], [address] [place], [place] [address]     Labs: Basic Metabolic Panel: Recent Labs  Lab 06/26/18 1445 06/28/18 1555 06/28/18 2315 06/29/18 0309 06/30/18 0246 07/01/18 0538  NA 136 132*  --  132* 133* 133*  K 3.8 3.4*  --  3.2* 3.4* 3.2*  CL 107 94*  --  103 102 102  CO2 14* 17*  --  15* 17* 21*  GLUCOSE 118* 198*  --  138* 92 100*  BUN 14 23*  --  20 10 10   CREATININE 1.25* 3.21*  --  1.61* 1.14* 1.01*  CALCIUM 9.3 10.1  --  8.8* 9.0 8.9  MG  --   --  2.1  --  2.3  --   PHOS  --   --  3.7  --   --   --    Liver Function Tests: Recent Labs  Lab 06/26/18 1445 06/28/18 1555 06/29/18 0309 07/01/18 0538  AST 38 59* 35 39  ALT 46* 63* 45* 45*  ALKPHOS 151* 194* 152* 138*  BILITOT 0.3 0.6 0.5 0.5  PROT 8.8* 10.8* 8.0 7.7  ALBUMIN 4.6 5.6* 4.2 3.9   Recent Labs  Lab 06/26/18 1445 06/28/18 1555  LIPASE 27 35   No results for input(s): AMMONIA in the last 168 hours. CBC: Recent Labs  Lab 06/26/18 1445 06/28/18 1555 06/29/18 0309 06/30/18 0246 07/01/18 0538  WBC 9.2 13.7* 15.6* 11.5* 13.6*  HGB 15.5* 17.8* 14.6 13.8 13.7  HCT 49.4* 55.3* 44.4 43.6 42.0  MCV 86.5 81.1 79.1* 82.0 82.8  PLT 384 618* 440* 381 350   Cardiac Enzymes: No results for input(s): CKTOTAL, CKMB, CKMBINDEX, TROPONINI in the last 168 hours. BNP: BNP (last 3 results) No results for input(s): BNP in the last 8760 hours.  ProBNP (last 3 results) No results for input(s): PROBNP in the last 8760 hours.  CBG: No  results for input(s): GLUCAP in the last 168 hours.     SignedRadene Gunning NP [facility] 07/01/2018, 3:27 PM

## 2018-07-01 NOTE — Progress Notes (Signed)
Pt asked if she could get a prescription for [place] at home the previous prescription she has only was for 3 days and she only has 1 dose left at home and he back is still hurting. MD paged awaiting call back

## 2018-07-01 NOTE — Plan of Care (Signed)

## 2018-07-01 NOTE — Progress Notes (Signed)
Diana Page to be D/C'd Home per MD order.  Discussed prescriptions and follow up appointments with the patient. Prescriptions given to patient, medication list explained in detail. Pt verbalized understanding.  Allergies as of 07/01/2018      Reactions   Nsaids Other (See Comments)   Cannot have because of her Ulcerative Colitis   Tolmetin Other (See Comments)   Cannot have because of her Ulcerative Colitis      Medication List    TAKE these medications   acetaminophen 500 MG tablet Commonly known as:  TYLENOL Take 500-1,000 mg by mouth every 6 (six) hours as needed (for pain).   acetaZOLAMIDE 500 MG capsule Commonly known as:  DIAMOX TAKE 1 CAPSULE (500 MG TOTAL) BY MOUTH 3 (THREE) TIMES DAILY.   calcium carbonate 500 MG chewable tablet Commonly known as:  TUMS - dosed in mg elemental calcium Chew 1-2 tablets by mouth as needed for indigestion or heartburn.   Drainable Fecal Collector Misc by Does not apply route.   fluvoxaMINE 25 MG tablet Commonly known as:  LUVOX Take 1 tablet (25 mg total) by mouth at bedtime.   HYDROcodone-acetaminophen 5-325 MG tablet Commonly known as:  NORCO/VICODIN Take 2 tablets by mouth every 4 (four) hours as needed. What changed:  reasons to take this   LORazepam 0.5 MG tablet Commonly known as:  ATIVAN Take 1 tablet (0.5 mg total) by mouth daily as needed for anxiety.   OLANZapine-FLUoxetine 6-50 MG capsule Commonly known as:  SYMBYAX Take 1 capsule by mouth at bedtime.   ondansetron 4 MG disintegrating tablet Commonly known as:  ZOFRAN ODT Take 1 tablet (4 mg total) by mouth every 8 (eight) hours as needed for nausea or vomiting.   Ostomy Supplies Misc by Does not apply route.   promethazine 25 MG tablet Commonly known as:  PHENERGAN Take 1 tablet (25 mg total) by mouth every 6 (six) hours as needed for nausea or vomiting.       Vitals:   07/01/18 0500 07/01/18 0843  BP: 108/83 120/70  Pulse: 94 (!) 105  Resp:  20   Temp: 97.9 F (36.6 C) 98 F (36.7 C)  SpO2: 95% 100%    Skin clean, dry and intact without evidence of skin break down, no evidence of skin tears noted. IV catheter discontinued intact. Site without signs and symptoms of complications. Dressing and pressure applied. Pt denies pain at this time. No complaints noted.  An After Visit Summary and prescriptions were printed and given to the patient. Patient escorted via [place], and D/C home via private auto.  Pablo Pena RN

## 2018-07-01 NOTE — Progress Notes (Signed)
Pt post void residual of 259m

## 2018-07-02 ENCOUNTER — Ambulatory Visit: Payer: Self-pay | Admitting: Dietician

## 2018-07-04 ENCOUNTER — Other Ambulatory Visit (HOSPITAL_COMMUNITY): Payer: Self-pay | Admitting: Psychiatry

## 2018-07-04 DIAGNOSIS — F429 Obsessive-compulsive disorder, unspecified: Secondary | ICD-10-CM

## 2018-07-04 DIAGNOSIS — F330 Major depressive disorder, recurrent, mild: Secondary | ICD-10-CM

## 2018-07-20 ENCOUNTER — Other Ambulatory Visit (HOSPITAL_COMMUNITY): Payer: Self-pay

## 2018-07-20 DIAGNOSIS — F429 Obsessive-compulsive disorder, unspecified: Secondary | ICD-10-CM

## 2018-07-20 DIAGNOSIS — F330 Major depressive disorder, recurrent, mild: Secondary | ICD-10-CM

## 2018-07-20 MED ORDER — OLANZAPINE-FLUOXETINE HCL 6-50 MG PO CAPS: 1.0000 | ORAL_CAPSULE | Freq: Every day | ORAL | 0 refills | Status: DC

## 2018-07-24 ENCOUNTER — Other Ambulatory Visit (HOSPITAL_COMMUNITY): Payer: Self-pay | Admitting: Psychiatry

## 2018-07-24 DIAGNOSIS — F429 Obsessive-compulsive disorder, unspecified: Secondary | ICD-10-CM

## 2018-07-24 DIAGNOSIS — F411 Generalized anxiety disorder: Secondary | ICD-10-CM

## 2018-07-24 DIAGNOSIS — F330 Major depressive disorder, recurrent, mild: Secondary | ICD-10-CM

## 2018-07-31 ENCOUNTER — Ambulatory Visit: Payer: Self-pay | Admitting: Psychiatry

## 2018-08-02 ENCOUNTER — Ambulatory Visit (HOSPITAL_COMMUNITY): Payer: 59 | Admitting: Psychiatry

## 2018-08-08 ENCOUNTER — Ambulatory Visit (INDEPENDENT_AMBULATORY_CARE_PROVIDER_SITE_OTHER): Payer: BLUE CROSS/BLUE SHIELD | Admitting: Psychiatry

## 2018-08-08 DIAGNOSIS — F429 Obsessive-compulsive disorder, unspecified: Secondary | ICD-10-CM | POA: Diagnosis not present

## 2018-08-08 DIAGNOSIS — F411 Generalized anxiety disorder: Secondary | ICD-10-CM | POA: Diagnosis not present

## 2018-08-08 DIAGNOSIS — F330 Major depressive disorder, recurrent, mild: Secondary | ICD-10-CM | POA: Diagnosis not present

## 2018-08-08 MED ORDER — ALPRAZOLAM 0.5 MG PO TABS: ORAL_TABLET | ORAL | 1 refills | Status: DC

## 2018-08-08 MED ORDER — OLANZAPINE-FLUOXETINE HCL 6-50 MG PO CAPS: 1.0000 | ORAL_CAPSULE | Freq: Every day | ORAL | 2 refills | Status: DC

## 2018-08-08 MED ORDER — FLUVOXAMINE MALEATE 25 MG PO TABS: ORAL_TABLET | ORAL | 2 refills | Status: DC

## 2018-08-08 NOTE — Progress Notes (Signed)
BH MD/PA/NP OP Progress Note  08/08/2018 9:09 AM Diana Page  MRN:  [id number]  Chief Complaint: I feel Luvox is helping me but is also causing insomnia.  Like to go back on Xanax because Ativan did not help anxiety.  HPI: Patient came for her appointment.  We tried Luvox on her last visit and switch from Xanax to Ativan as she felt Xanax is making her sleepy.  However she noticed that Xanax was actually working better than Ativan to help her anxiety.  She also like Luvox but she feel it is causing insomnia.  Recommended to try taking in the daytime.  Recently she had nausea vomiting and hospitalized due to dehydration.  She is feeling better now.  She still have residual OCD anxiety and depression but she feels the current medicine working better.  She is in the process of getting bariatric surgery however recently hospitalization make the process slow.  She is going to [place] to visit her parents tomorrow.  She has fair energy.  She denies any paranoia, hallucination or any suicidal thoughts.  She is applying for the job but currently enrolled online classes.  She denies drinking or using any illegal substances.  She has no tremors or shakes.  Visit Diagnosis:    ICD-10-CM   1. Obsessive-compulsive disorder, unspecified type F42.9 fluvoxaMINE (LUVOX) 25 MG tablet    OLANZapine-FLUoxetine (SYMBYAX) 6-50 MG capsule  2. GAD (generalized anxiety disorder) F41.1 ALPRAZolam (XANAX) 0.5 MG tablet    fluvoxaMINE (LUVOX) 25 MG tablet  3. MDD (major depressive disorder), recurrent episode, mild (HCC) F33.0 fluvoxaMINE (LUVOX) 25 MG tablet    OLANZapine-FLUoxetine (SYMBYAX) 6-50 MG capsule    Past Psychiatric History: Reviewed. H/O depression and OCD. Tried Prozac when she was in [place]. No H/O hallucination, paranoia, suicidal attempt or any psychiatric inpatient treatment. Had a good response with Symbyax.Tried Xanax but it make her tired and sleepy.  Past Medical History:  Past Medical  History:  Diagnosis Date  . Anemia   . Anxiety   . Back pain   . Depression   . Fatty liver   . Gallbladder problem   . GERD (gastroesophageal reflux disease)   . Hypertension    Intercranial Hypertension  . IBS (irritable bowel syndrome)   . Joint pain   . Obsessive compulsive disorder 06/14  . Ostomy nurse consultation   . Pancreatitis   . Pancreatitis    hx of   . SOB (shortness of breath)   . UC (ulcerative colitis) [facility])     Past Surgical History:  Procedure Laterality Date  . CHOLECYSTECTOMY    . LAPAROSCOPIC PARTIAL PROTECTOMY    . PERMANENT ILEOSTOMY    . TOTAL COLECTOMY N/A     Family Psychiatric History: Reviewed.  Family History:  Family History  Problem Relation Age of Onset  . OCD Mother   . Diabetes Mother   . Depression Mother   . Anxiety disorder Mother   . Obesity Mother   . Obesity Father   . OCD Sister   . Depression Sister   . OCD Brother   . Anxiety disorder Brother     Social History:  Social History   Socioeconomic History  . Marital status: Married    Spouse name: Not on file  . Number of children: 0  . Years of education: Ba  . Highest education level: Not on file  Occupational History  . Occupation: Librarian, academic     Comment: Chief Operating Officer  Social Needs  . Financial resource strain: Not on file  . Food insecurity:    Worry: Not on file    Inability: Not on file  . Transportation needs:    Medical: Not on file    Non-medical: Not on file  Tobacco Use  . Smoking status: Former Smoker    Types: Cigarettes, E-cigarettes    Last attempt to quit: 06/27/2009    Years since quitting: 9.1  . Smokeless tobacco: Never Used  Substance and Sexual Activity  . Alcohol use: Yes    Alcohol/week: 0.0 standard drinks    Comment: occasionally   . Drug use: No  . Sexual activity: Not Currently    Partners: Male    Birth control/protection: Condom, Pill  Lifestyle  . Physical activity:    Days per week: Not on file    Minutes per  session: Not on file  . Stress: Not on file  Relationships  . Social connections:    Talks on phone: Not on file    Gets together: Not on file    Attends religious service: Not on file    Active member of club or organization: Not on file    Attends meetings of clubs or organizations: Not on file    Relationship status: Not on file  Other Topics Concern  . Not on file  Social History Narrative   Lives at home with husband, Lennette Bihari.   Caffeine use: 2-3 cups daily   Right handed    Allergies:  Allergies  Allergen Reactions  . Nsaids Other (See Comments)    Cannot have because of her Ulcerative Colitis  . Tolmetin Other (See Comments)    Cannot have because of her Ulcerative Colitis    Metabolic Disorder Labs: Recent Results (from the past 2160 hour(s))  Lipase, blood     Status: None   Collection Time: 05/12/18  9:36 PM  Result Value Ref Range   Lipase 36 11 - 51 U/L    Comment: Performed at [facility], [place] [address], [place], [place] [address]  Comprehensive metabolic panel     Status: Abnormal   Collection Time: 05/12/18  9:36 PM  Result Value Ref Range   Sodium 134 (L) 135 - 145 mmol/L   Potassium 3.4 (L) 3.5 - 5.1 mmol/L   Chloride 106 98 - 111 mmol/L   CO2 18 (L) 22 - 32 mmol/L   Glucose, Bld 118 (H) 70 - 99 mg/dL   BUN 9 6 - 20 mg/dL   Creatinine, Ser 0.94 0.44 - 1.00 mg/dL   Calcium 8.7 (L) 8.9 - 10.3 mg/dL   Total Protein 7.7 6.5 - 8.1 g/dL   Albumin 3.7 3.5 - 5.0 g/dL   AST 41 15 - 41 U/L   ALT 47 (H) 0 - 44 U/L   Alkaline Phosphatase 136 (H) 38 - 126 U/L   Total Bilirubin 0.2 (L) 0.3 - 1.2 mg/dL   GFR calc non Af Amer >60 >60 mL/min   GFR calc Af Amer >60 >60 mL/min    Comment: (NOTE) The eGFR has been calculated using the CKD EPI equation. This calculation has not been validated in all clinical situations. eGFR's persistently <60 mL/min signify possible Chronic Kidney Disease.    Anion gap 10 5 - 15    Comment: Performed at [facility] [address] [place], [place] [address]  CBC     Status: Abnormal   Collection Time: 05/12/18  9:36 PM  Result Value Ref Range   WBC 11.9 (H) 4.0 - 10.5 K/uL   RBC 4.63 3.87 - 5.11 MIL/uL   Hemoglobin 12.2 12.0 - 15.0 g/dL   HCT 39.4 36.0 - 46.0 %   MCV 85.1 80.0 - 100.0 fL   MCH 26.3 26.0 - 34.0 pg   MCHC 31.0 30.0 - 36.0 g/dL   RDW 13.7 11.5 - 15.5 %   Platelets 272 150 - 400 K/uL   nRBC 0.0 0.0 - 0.2 %    Comment: Performed at [facility] [address], [place], [place] [address]  Urinalysis, Routine w reflex microscopic     Status: None   Collection Time: 05/12/18  9:50 PM  Result Value Ref Range   Color, Urine YELLOW YELLOW   APPearance CLEAR CLEAR   Specific Gravity, Urine 1.008 1.005 - 1.030   pH 7.0 5.0 - 8.0   Glucose, UA NEGATIVE NEGATIVE mg/dL   Hgb urine dipstick NEGATIVE NEGATIVE   Bilirubin Urine NEGATIVE NEGATIVE   Ketones, ur NEGATIVE NEGATIVE mg/dL   Protein, ur NEGATIVE NEGATIVE mg/dL   Nitrite NEGATIVE NEGATIVE   Leukocytes, UA NEGATIVE NEGATIVE   RBC / HPF 0-5 0 - 5 RBC/hpf   WBC, UA 0-5 0 - 5 WBC/hpf   Bacteria, UA NONE SEEN NONE SEEN    Comment: Performed at [facility], [place] [address], [place], [place] [address]  I-Stat beta hCG blood, ED     Status: None   Collection Time: 05/12/18  9:59 PM  Result Value Ref Range   I-stat hCG, quantitative <5.0 <5 mIU/mL   Comment 3            Comment:   GEST. AGE      CONC.  (mIU/mL)   <=1 WEEK        5 - 50     2 WEEKS       50 - 500     3 WEEKS       100 - 10,000     4 WEEKS     1,000 - 30,000        FEMALE AND NON-PREGNANT FEMALE:     LESS THAN 5 mIU/mL   I-Stat CG4 Lactic Acid, ED     Status: None   Collection Time: 05/13/18  1:08 AM  Result Value Ref Range   Lactic Acid, Venous 1.76 0.5 - 1.9 mmol/L  Lipase, blood     Status: None   Collection Time: 06/26/18  2:45 PM  Result Value Ref Range   Lipase 27 11 - 51 U/L    Comment: Performed at [facility], [place]  [address] [place], [place] [address]  Comprehensive metabolic panel     Status: Abnormal   Collection Time: 06/26/18  2:45 PM  Result Value Ref Range   Sodium 136 135 - 145 mmol/L   Potassium 3.8 3.5 - 5.1 mmol/L   Chloride 107 98 - 111 mmol/L   CO2 14 (L) 22 - 32 mmol/L   Glucose, Bld 118 (H) 70 - 99 mg/dL   BUN 14 6 - 20 mg/dL   Creatinine, Ser 1.25 (H) 0.44 - 1.00 mg/dL   Calcium 9.3 8.9 - 10.3 mg/dL   Total Protein 8.8 (H) 6.5 - 8.1 g/dL   Albumin 4.6 3.5 - 5.0 g/dL   AST 38 15 - 41 U/L   ALT 46 (H) 0 - 44 U/L   Alkaline Phosphatase 151 (H) 38 - 126  U/L   Total Bilirubin 0.3 0.3 - 1.2 mg/dL   GFR calc non Af Amer 58 (L) >60 mL/min   GFR calc Af Amer >60 >60 mL/min   Anion gap 15 5 - 15    Comment: Performed at [facility] [address] [place], [place] [address]  CBC     Status: Abnormal   Collection Time: 06/26/18  2:45 PM  Result Value Ref Range   WBC 9.2 4.0 - 10.5 K/uL   RBC 5.71 (H) 3.87 - 5.11 MIL/uL   Hemoglobin 15.5 (H) 12.0 - 15.0 g/dL   HCT 49.4 (H) 36.0 - 46.0 %   MCV 86.5 80.0 - 100.0 fL   MCH 27.1 26.0 - 34.0 pg   MCHC 31.4 30.0 - 36.0 g/dL   RDW 13.9 11.5 - 15.5 %   Platelets 384 150 - 400 K/uL   nRBC 0.0 0.0 - 0.2 %    Comment: Performed at [facility], [place] [address], [place], [place] [address]  I-Stat beta hCG blood, ED     Status: None   Collection Time: 06/26/18  2:51 PM  Result Value Ref Range   I-stat hCG, quantitative <5.0 <5 mIU/mL   Comment 3            Comment:   GEST. AGE      CONC.  (mIU/mL)   <=1 WEEK        5 - 50     2 WEEKS       50 - 500     3 WEEKS       100 - 10,000     4 WEEKS     1,000 - 30,000        FEMALE AND NON-PREGNANT FEMALE:     LESS THAN 5 mIU/mL   Urinalysis, Routine w reflex microscopic     Status: Abnormal   Collection Time: 06/26/18  4:10 PM  Result Value Ref Range   Color, Urine AMBER (A) YELLOW    Comment: BIOCHEMICALS MAY BE AFFECTED BY COLOR   APPearance CLOUDY (A) CLEAR   Specific Gravity, Urine  1.027 1.005 - 1.030   pH 5.0 5.0 - 8.0   Glucose, UA NEGATIVE NEGATIVE mg/dL   Hgb urine dipstick MODERATE (A) NEGATIVE   Bilirubin Urine SMALL (A) NEGATIVE   Ketones, ur NEGATIVE NEGATIVE mg/dL   Protein, ur 100 (A) NEGATIVE mg/dL   Nitrite NEGATIVE NEGATIVE   Leukocytes, UA NEGATIVE NEGATIVE   RBC / HPF 0-5 0 - 5 RBC/hpf   WBC, UA 6-10 0 - 5 WBC/hpf   Bacteria, UA MANY (A) NONE SEEN   Squamous Epithelial / LPF 0-5 0 - 5   Mucus PRESENT    Hyaline Casts, UA PRESENT     Comment: Performed at [facility], [address], [place], [place] [address]  Urine culture     Status: None   Collection Time: 06/26/18  9:14 PM  Result Value Ref Range   Specimen Description URINE, CLEAN CATCH    Special Requests      NONE Performed at [facility], [place] [address] [place], [place] [address]    Culture      Multiple bacterial morphotypes present, none predominant. Suggest appropriate recollection if clinically indicated.   Report Status 06/28/2018 FINAL   Lipase, blood     Status: None   Collection Time: 06/28/18  3:55 PM  Result Value Ref Range   Lipase 35  11 - 51 U/L    Comment: Performed at [facility], [place] [address], [place], [place] [address]  Comprehensive metabolic panel     Status: Abnormal   Collection Time: 06/28/18  3:55 PM  Result Value Ref Range   Sodium 132 (L) 135 - 145 mmol/L   Potassium 3.4 (L) 3.5 - 5.1 mmol/L   Chloride 94 (L) 98 - 111 mmol/L   CO2 17 (L) 22 - 32 mmol/L   Glucose, Bld 198 (H) 70 - 99 mg/dL   BUN 23 (H) 6 - 20 mg/dL   Creatinine, Ser 3.21 (H) 0.44 - 1.00 mg/dL   Calcium 10.1 8.9 - 10.3 mg/dL   Total Protein 10.8 (H) 6.5 - 8.1 g/dL   Albumin 5.6 (H) 3.5 - 5.0 g/dL   AST 59 (H) 15 - 41 U/L   ALT 63 (H) 0 - 44 U/L   Alkaline Phosphatase 194 (H) 38 - 126 U/L   Total Bilirubin 0.6 0.3 - 1.2 mg/dL   GFR calc non Af Amer 19 (L) >60 mL/min   GFR calc Af Amer 22 (L) >60 mL/min   Anion gap 21 (H) 5 - 15    Comment: Performed at [facility], [place] [address], [place], [place] [address]  CBC     Status: Abnormal   Collection Time: 06/28/18  3:55 PM  Result Value Ref Range   WBC 13.7 (H) 4.0 - 10.5 K/uL   RBC 6.82 (H) 3.87 - 5.11 MIL/uL   Hemoglobin 17.8 (H) 12.0 - 15.0 g/dL   HCT 55.3 (H) 36.0 - 46.0 %    Comment: REPEATED TO VERIFY   MCV 81.1 80.0 - 100.0 fL   MCH 26.1 26.0 - 34.0 pg   MCHC 32.2 30.0 - 36.0 g/dL   RDW 14.2 11.5 - 15.5 %   Platelets 618 (H) 150 - 400 K/uL   nRBC 0.0 0.0 - 0.2 %    Comment: Performed at [facility] [address] [place], [place] [address]  I-Stat beta hCG blood, ED     Status: None   Collection Time: 06/28/18  4:12 PM  Result Value Ref Range   I-stat hCG, quantitative <5.0 <5 mIU/mL   Comment 3            Comment:   GEST. AGE      CONC.  (mIU/mL)   <=1 WEEK        5 - 50     2 WEEKS       50 - 500     3 WEEKS       100 - 10,000     4 WEEKS     1,000 - 30,000        FEMALE AND NON-PREGNANT FEMALE:     LESS THAN 5 mIU/mL   Magnesium     Status: None   Collection Time: 06/28/18 11:15 PM  Result Value Ref Range   Magnesium 2.1 1.7 - 2.4 mg/dL    Comment: Performed at [facility], [place] [address], [place], [place] [address]  Phosphorus     Status: None   Collection Time: 06/28/18 11:15 PM  Result Value Ref Range   Phosphorus 3.7 2.5 - 4.6 mg/dL    Comment: Performed at [facility] [address] [place], [place] [address]  Lactic acid, plasma     Status: None   Collection Time: 06/28/18 11:48 PM  Result Value Ref  Range   Lactic Acid, Venous 1.4 0.5 - 1.9 mmol/L    Comment: Performed at [facility] [address], [place], [place] [address]  Urinalysis, Routine w reflex microscopic     Status: Abnormal   Collection Time: 06/29/18  1:55 AM  Result Value Ref Range   Color, Urine YELLOW YELLOW   APPearance CLOUDY (A) CLEAR   Specific Gravity, Urine 1.021 1.005 - 1.030   pH 6.0 5.0 - 8.0   Glucose, UA NEGATIVE NEGATIVE mg/dL   Hgb urine  dipstick LARGE (A) NEGATIVE   Bilirubin Urine NEGATIVE NEGATIVE   Ketones, ur 5 (A) NEGATIVE mg/dL   Protein, ur 100 (A) NEGATIVE mg/dL   Nitrite NEGATIVE NEGATIVE   Leukocytes, UA NEGATIVE NEGATIVE    Comment: Performed at [facility] [address] [place], [place] [address]  Urinalysis, Microscopic (reflex)     Status: Abnormal   Collection Time: 06/29/18  1:55 AM  Result Value Ref Range   RBC / HPF 11-20 0 - 5 RBC/hpf   WBC, UA 0-5 0 - 5 WBC/hpf   Bacteria, UA FEW (A) NONE SEEN   Squamous Epithelial / LPF 6-10 0 - 5   Mucus PRESENT    Hyaline Casts, UA PRESENT     Comment: Performed at [facility], [place] [address] [place], [place] [address]  HIV antibody (Routine Testing)     Status: None   Collection Time: 06/29/18  3:09 AM  Result Value Ref Range   HIV Screen 4th Generation wRfx Non Reactive Non Reactive    Comment: (NOTE) Performed At: [facility] [place], [place] [id number] Rush Farmer MD [id number]   CBC     Status: Abnormal   Collection Time: 06/29/18  3:09 AM  Result Value Ref Range   WBC 15.6 (H) 4.0 - 10.5 K/uL   RBC 5.61 (H) 3.87 - 5.11 MIL/uL   Hemoglobin 14.6 12.0 - 15.0 g/dL   HCT 44.4 36.0 - 46.0 %   MCV 79.1 (L) 80.0 - 100.0 fL   MCH 26.0 26.0 - 34.0 pg   MCHC 32.9 30.0 - 36.0 g/dL   RDW 13.4 11.5 - 15.5 %   Platelets 440 (H) 150 - 400 K/uL   nRBC 0.0 0.0 - 0.2 %    Comment: Performed at [facility], [place]. [address] [place], [place] [address]  Basic metabolic panel     Status: Abnormal   Collection Time: 06/29/18  3:09 AM  Result Value Ref Range   Sodium 132 (L) 135 - 145 mmol/L   Potassium 3.2 (L) 3.5 - 5.1 mmol/L   Chloride 103 98 - 111 mmol/L   CO2 15 (L) 22 - 32 mmol/L   Glucose, Bld 138 (H) 70 - 99 mg/dL   BUN 20 6 - 20 mg/dL   Creatinine, Ser 1.61 (H) 0.44 - 1.00 mg/dL    Comment: DELTA CHECK NOTED   Calcium 8.8 (L) 8.9 - 10.3 mg/dL   GFR calc non Af Amer 43 (L) >60 mL/min   GFR calc Af  Amer 50 (L) >60 mL/min   Anion gap 14 5 - 15    Comment: Performed at [facility] [address], [place], [place] [address]  Hepatic function panel     Status: Abnormal   Collection Time: 06/29/18  3:09 AM  Result Value Ref Range   Total Protein 8.0 6.5 - 8.1 g/dL   Albumin 4.2 3.5 -  5.0 g/dL   AST 35 15 - 41 U/L   ALT 45 (H) 0 - 44 U/L   Alkaline Phosphatase 152 (H) 38 - 126 U/L   Total Bilirubin 0.5 0.3 - 1.2 mg/dL   Bilirubin, Direct 0.1 0.0 - 0.2 mg/dL   Indirect Bilirubin 0.4 0.3 - 0.9 mg/dL    Comment: Performed at [facility] [address], [place], [place] [address]  CBC     Status: Abnormal   Collection Time: 06/30/18  2:46 AM  Result Value Ref Range   WBC 11.5 (H) 4.0 - 10.5 K/uL   RBC 5.32 (H) 3.87 - 5.11 MIL/uL   Hemoglobin 13.8 12.0 - 15.0 g/dL   HCT 43.6 36.0 - 46.0 %   MCV 82.0 80.0 - 100.0 fL   MCH 25.9 (L) 26.0 - 34.0 pg   MCHC 31.7 30.0 - 36.0 g/dL   RDW 13.7 11.5 - 15.5 %   Platelets 381 150 - 400 K/uL   nRBC 0.0 0.0 - 0.2 %    Comment: Performed at [facility], [place] [address] [place], [place] [address]  Basic metabolic panel     Status: Abnormal   Collection Time: 06/30/18  2:46 AM  Result Value Ref Range   Sodium 133 (L) 135 - 145 mmol/L   Potassium 3.4 (L) 3.5 - 5.1 mmol/L   Chloride 102 98 - 111 mmol/L   CO2 17 (L) 22 - 32 mmol/L   Glucose, Bld 92 70 - 99 mg/dL   BUN 10 6 - 20 mg/dL   Creatinine, Ser 1.14 (H) 0.44 - 1.00 mg/dL   Calcium 9.0 8.9 - 10.3 mg/dL   GFR calc non Af Amer >60 >60 mL/min   GFR calc Af Amer >60 >60 mL/min   Anion gap 14 5 - 15    Comment: Performed at [facility], [place] [address], [place], [place] [address]  Magnesium     Status: None   Collection Time: 06/30/18  2:46 AM  Result Value Ref Range   Magnesium 2.3 1.7 - 2.4 mg/dL    Comment: Performed at [facility] [address], [place], [place] [address]  Gastrointestinal Panel by PCR , Stool     Status: Abnormal   Collection Time:  06/30/18  9:35 AM  Result Value Ref Range   Campylobacter species NOT DETECTED NOT DETECTED   Plesimonas shigelloides NOT DETECTED NOT DETECTED   Salmonella species NOT DETECTED NOT DETECTED   Yersinia enterocolitica NOT DETECTED NOT DETECTED   Vibrio species NOT DETECTED NOT DETECTED   Vibrio cholerae NOT DETECTED NOT DETECTED   Enteroaggregative E coli (EAEC) NOT DETECTED NOT DETECTED   Enteropathogenic E coli (EPEC) NOT DETECTED NOT DETECTED   Enterotoxigenic E coli (ETEC) NOT DETECTED NOT DETECTED   Shiga like toxin producing E coli (STEC) NOT DETECTED NOT DETECTED   Shigella/Enteroinvasive E coli (EIEC) NOT DETECTED NOT DETECTED   Cryptosporidium NOT DETECTED NOT DETECTED   Cyclospora cayetanensis NOT DETECTED NOT DETECTED   Entamoeba histolytica NOT DETECTED NOT DETECTED   Giardia lamblia NOT DETECTED NOT DETECTED   Adenovirus F40/41 DETECTED (A) NOT DETECTED   Astrovirus NOT DETECTED NOT DETECTED   Norovirus GI/GII NOT DETECTED NOT DETECTED   Rotavirus A NOT DETECTED NOT DETECTED   Sapovirus (I, II, IV, and V) NOT DETECTED NOT DETECTED    Comment: Performed at [facility], [address] [place],  [address]  Comprehensive metabolic panel  Status: Abnormal   Collection Time: 07/01/18  5:38 AM  Result Value Ref Range   Sodium 133 (L) 135 - 145 mmol/L   Potassium 3.2 (L) 3.5 - 5.1 mmol/L   Chloride 102 98 - 111 mmol/L   CO2 21 (L) 22 - 32 mmol/L   Glucose, Bld 100 (H) 70 - 99 mg/dL   BUN 10 6 - 20 mg/dL   Creatinine, Ser 1.01 (H) 0.44 - 1.00 mg/dL   Calcium 8.9 8.9 - 10.3 mg/dL   Total Protein 7.7 6.5 - 8.1 g/dL   Albumin 3.9 3.5 - 5.0 g/dL   AST 39 15 - 41 U/L   ALT 45 (H) 0 - 44 U/L   Alkaline Phosphatase 138 (H) 38 - 126 U/L   Total Bilirubin 0.5 0.3 - 1.2 mg/dL   GFR calc non Af Amer >60 >60 mL/min   GFR calc Af Amer >60 >60 mL/min   Anion gap 10 5 - 15    Comment: Performed at [facility], [place] [address], [place], [place] [address]   CBC     Status: Abnormal   Collection Time: 07/01/18  5:38 AM  Result Value Ref Range   WBC 13.6 (H) 4.0 - 10.5 K/uL   RBC 5.07 3.87 - 5.11 MIL/uL   Hemoglobin 13.7 12.0 - 15.0 g/dL   HCT 42.0 36.0 - 46.0 %   MCV 82.8 80.0 - 100.0 fL   MCH 27.0 26.0 - 34.0 pg   MCHC 32.6 30.0 - 36.0 g/dL   RDW 13.7 11.5 - 15.5 %   Platelets 350 150 - 400 K/uL   nRBC 0.0 0.0 - 0.2 %    Comment: Performed at [facility], [place] [address], [place], [place] [address]  TSH     Status: None   Collection Time: 07/01/18  5:38 AM  Result Value Ref Range   TSH 1.914 0.350 - 4.500 uIU/mL    Comment: Performed by a 3rd Generation assay with a functional sensitivity of <=0.01 uIU/mL. Performed at [facility], [place] [address], [place], [place] [address]    Lab Results  Component Value Date   HGBA1C 5.1 09/07/2016   No results found for: PROLACTIN Lab Results  Component Value Date   CHOL 157 09/07/2016   TRIG 143 09/07/2016   HDL 34 (L) 09/07/2016   LDLCALC 94 09/07/2016   Lab Results  Component Value Date   TSH 1.914 07/01/2018   TSH 1.740 09/07/2016    Therapeutic Level Labs: No results found for: LITHIUM No results found for: VALPROATE No components found for:  CBMZ  Current Medications: Current Outpatient Medications  Medication Sig Dispense Refill  . acetaminophen (TYLENOL) 500 MG tablet Take 500-1,000 mg by mouth every 6 (six) hours as needed (for pain).    Marland Kitchen acetaZOLAMIDE (DIAMOX) 500 MG capsule TAKE 1 CAPSULE (500 MG TOTAL) BY MOUTH 3 (THREE) TIMES DAILY. 270 capsule 11  . calcium carbonate (TUMS - DOSED IN MG ELEMENTAL CALCIUM) 500 MG chewable tablet Chew 1-2 tablets by mouth as needed for indigestion or heartburn.    . fluvoxaMINE (LUVOX) 25 MG tablet TAKE 1 TABLET BY MOUTH EVERYDAY AT BEDTIME 30 tablet 0  . HYDROcodone-acetaminophen (NORCO/VICODIN) 5-325 MG tablet Take 2 tablets by mouth every 4 (four) hours as needed. (Patient taking differently: Take 2 tablets by mouth  every 4 (four) hours as needed for moderate pain. ) 10 tablet 0  . Incontinence Supplies (DRAINABLE FECAL COLLECTOR) MISC by  Does not apply route.    Marland Kitchen LORazepam (ATIVAN) 0.5 MG tablet Take 1 tablet (0.5 mg total) by mouth daily as needed for anxiety. 15 tablet 0  . OLANZapine-FLUoxetine (SYMBYAX) 6-50 MG capsule Take 1 capsule by mouth daily. 30 capsule 0  . ondansetron (ZOFRAN ODT) 4 MG disintegrating tablet Take 1 tablet (4 mg total) by mouth every 8 (eight) hours as needed for nausea or vomiting. 20 tablet 0  . Ostomy Supplies MISC by Does not apply route.    . promethazine (PHENERGAN) 25 MG tablet Take 1 tablet (25 mg total) by mouth every 6 (six) hours as needed for nausea or vomiting. 30 tablet 0   No current facility-administered medications for this visit.      Musculoskeletal: Strength & Muscle Tone: within normal limits Gait & Station: normal Patient leans: N/A  Psychiatric Specialty Exam: ROS  Blood pressure 101/72, pulse 72, height 5' 2"  (1.575 m), weight 238 lb (108 kg).There is no height or weight on file to calculate BMI.  General Appearance: Casual  Eye Contact:  Fair  Speech:  Slow  Volume:  Normal  Mood:  Anxious  Affect:  Appropriate  Thought Process:  Goal Directed  Orientation:  Full (Time, Place, and Person)  Thought Content: Obsessions   Suicidal Thoughts:  No  Homicidal Thoughts:  No  Memory:  Immediate;   Good Recent;   Good Remote;   Good  Judgement:  Good  Insight:  Good  Psychomotor Activity:  Normal  Concentration:  Concentration: Good and Attention Span: Good  Recall:  Good  Fund of Knowledge: Good  Language: Good  Akathisia:  No  Handed:  Right  AIMS (if indicated): not done  Assets:  Communication Skills Desire for Improvement Housing Resilience Social Support  ADL's:  Intact  Cognition: WNL  Sleep:  Fair   Screenings: PHQ2-9     Nutrition from 06/04/2018 in [facility] Office Visit from 09/07/2016 in  [facility] Nutrition from 04/08/2016 in [facility]  PHQ-2 Total Score  4  4  1   PHQ-9 Total Score  15  15  -       Assessment and Plan: Does have compulsive disorder.  Generalized anxiety disorder.  Panic attacks.  Major depressive disorder.  Patient like to go back on Xanax which helped her anxiety but making her sleepy.  She was not taking Xanax every day.  I will discontinue Ativan and restart Xanax 0.5 mg to 0.75 mg as needed for anxiety.  I also recommend to take the Luvox in the morning since it is helping her OCD symptoms.  Continue Symbyax 9/50 mg at bedtime.  She has not started therapy with [facility].  Reviewed blood work from the recent hospitalization.  She is still recovering from dehydration but her energy level is better.  Recommended to call us back if she is any question or any concern.  Follow-up in 3 months.   Kathlee Nations, MD 08/08/2018, 9:09 AM

## 2018-09-29 ENCOUNTER — Other Ambulatory Visit (HOSPITAL_COMMUNITY): Payer: Self-pay | Admitting: Psychiatry

## 2018-09-29 DIAGNOSIS — F411 Generalized anxiety disorder: Secondary | ICD-10-CM

## 2018-09-29 DIAGNOSIS — F330 Major depressive disorder, recurrent, mild: Secondary | ICD-10-CM

## 2018-09-29 DIAGNOSIS — F429 Obsessive-compulsive disorder, unspecified: Secondary | ICD-10-CM

## 2018-10-02 ENCOUNTER — Telehealth (HOSPITAL_COMMUNITY): Payer: Self-pay

## 2018-10-02 NOTE — Telephone Encounter (Signed)
Medication management - Left a message for patient after calling her [facility] to verify she still has a refill of her Luvox but cannot be filled until the 9th.  Requested patient call back if any further problems getting refill.

## 2018-10-31 ENCOUNTER — Other Ambulatory Visit (HOSPITAL_COMMUNITY): Payer: Self-pay | Admitting: Psychiatry

## 2018-10-31 DIAGNOSIS — F411 Generalized anxiety disorder: Secondary | ICD-10-CM

## 2018-10-31 DIAGNOSIS — F429 Obsessive-compulsive disorder, unspecified: Secondary | ICD-10-CM

## 2018-10-31 DIAGNOSIS — F330 Major depressive disorder, recurrent, mild: Secondary | ICD-10-CM

## 2018-11-05 ENCOUNTER — Telehealth: Payer: Self-pay | Admitting: Neurology

## 2018-11-05 NOTE — Telephone Encounter (Signed)
Pt has consented to a Virtual Visit and for the insurance to be billed as such. She was informed that if any copays apply she would be billed for it. Pt understood.  Pt's Phone [employer] is [employer]. Pt confirmed cell phone number that is in chart.

## 2018-11-05 NOTE — Telephone Encounter (Signed)
Link to [email or web address] website for meeting emailed to [email or web address]

## 2018-11-06 ENCOUNTER — Encounter: Payer: Self-pay | Admitting: Neurology

## 2018-11-06 ENCOUNTER — Ambulatory Visit (INDEPENDENT_AMBULATORY_CARE_PROVIDER_SITE_OTHER): Payer: BLUE CROSS/BLUE SHIELD | Admitting: Psychiatry

## 2018-11-06 ENCOUNTER — Encounter (HOSPITAL_COMMUNITY): Payer: Self-pay | Admitting: Psychiatry

## 2018-11-06 ENCOUNTER — Other Ambulatory Visit: Payer: Self-pay

## 2018-11-06 DIAGNOSIS — F330 Major depressive disorder, recurrent, mild: Secondary | ICD-10-CM | POA: Diagnosis not present

## 2018-11-06 DIAGNOSIS — F411 Generalized anxiety disorder: Secondary | ICD-10-CM

## 2018-11-06 DIAGNOSIS — F429 Obsessive-compulsive disorder, unspecified: Secondary | ICD-10-CM | POA: Diagnosis not present

## 2018-11-06 MED ORDER — OLANZAPINE-FLUOXETINE HCL 6-50 MG PO CAPS: 1.0000 | ORAL_CAPSULE | Freq: Every day | ORAL | 2 refills | Status: DC

## 2018-11-06 MED ORDER — CLONAZEPAM 0.5 MG PO TABS: ORAL_TABLET | ORAL | 1 refills | Status: DC

## 2018-11-06 MED ORDER — FLUVOXAMINE MALEATE 25 MG PO TABS: ORAL_TABLET | ORAL | 2 refills | Status: DC

## 2018-11-06 NOTE — Telephone Encounter (Signed)
Called pt & LVM asking for call back for a brief call before her virtual appt tomorrow. Left office number in message.

## 2018-11-06 NOTE — Telephone Encounter (Signed)
Pt returned my call. Confirmed pt using 2 identifiers. Updated pt's chart. No changes to PMH since last reviewed in 05/2019. Meds updated. Pt will provide insurance info at the "check-in" she will receive 30 minutes prior to visit. Pt confirmed she received the link.

## 2018-11-06 NOTE — Progress Notes (Signed)
Virtual Visit via Telephone Note  I connected with Diana Page on 11/06/18 at 10:40 AM EDT by telephone and verified that I am speaking with the correct person using two identifiers.   I discussed the limitations, risks, security and privacy concerns of performing an evaluation and management service by telephone and the availability of in person appointments. I also discussed with the patient that there may be a patient responsible charge related to this service. The patient expressed understanding and agreed to proceed.   History of Present Illness: Patient evaluated through phone session.  She like addition of Luvox as she noticed her OCD is not worsening.  However she is complaining of poor sleep when she gets tired in the morning due to lack of sleep.  She is only getting few hours.  She does not feel Xanax working and she like to try a different medication for insomnia.  She does not want to change her Symbyax and to walk since she feel her depression anxiety and OCD is a stable.  She is in the process of getting bariatric surgery.  She lives with her husband who is supportive.  Patient denies any agitation, paranoia, suicidal thoughts.  She denies any crying spells or any feeling of hopelessness or worthlessness.  She reported her appetite is okay but energy level is low due to lack of sleep.   Past Psychiatric History: Reviewed. H/O depression and OCD. Tried Prozac when she was in [place]. No H/O hallucination, paranoia, suicidal attempt or any psychiatric inpatient treatment. Had a good response with Symbyax.Tried Xanax but it make her tired and sleepy.   Past Psychiatric History: Reviewed. H/O depression and OCD. Tried Prozac when she was in [place]. No H/O hallucination, paranoia, suicidal attempt or any psychiatric inpatient treatment. Had a good response with Symbyax.Tried Xanax but it make her tired and sleepy.  Observations/Objective: Mental status examination done on  the phone.  Patient described her mood tired.  Her speech is slow but soft, clear and coherent.  Her thought process logical.  Her attention concentration is okay.  She denies any auditory or visual hallucination.  She denies any active or passive suicidal thoughts or homicidal thought.  There were no delusions, paranoia or any grandiosity.  She is alert and oriented x3.  She reported no tremors, shakes or any EPS.  Her fund of knowledge is adequate.  Her cognition is good. Her insight and judgment is okay.  Assessment and Plan: Obsessive-compulsive disorder.  Generalized anxiety disorder.  Major depressive disorder, recurrent.  Patient told her anxiety OCD and depression is a stable however she is having poor sleep and she gets tired in the morning.  She like to try a different medication since does not feel Xanax is working.  Despite taking Luvox in the morning she still have issues for insomnia.  She does not want to change her Luvox dose or Symbyax.  I will continue Symbyax 9/50 mg at bedtime and Luvox 25 mg in the morning.  We will discontinue Xanax as it is not working.  We will try clonazepam 0.5 mg to take 1 to 2 tablet as needed for insomnia.  We also discussed if Klonopin did not work then we can try temazepam.  Recommended to call us back if she has any question or any concern.  Follow-up in 3 months.  Follow Up Instructions:    I discussed the assessment and treatment plan with the patient. The patient was provided an opportunity to ask  questions and all were answered. The patient agreed with the plan and demonstrated an understanding of the instructions.   The patient was advised to call back or seek an in-person evaluation if the symptoms worsen or if the condition fails to improve as anticipated.  I provided 20 minutes of non-face-to-face time during this encounter.   Kathlee Nations, MD

## 2018-11-07 ENCOUNTER — Other Ambulatory Visit: Payer: Self-pay

## 2018-11-07 ENCOUNTER — Ambulatory Visit (INDEPENDENT_AMBULATORY_CARE_PROVIDER_SITE_OTHER): Payer: BLUE CROSS/BLUE SHIELD | Admitting: Neurology

## 2018-11-07 ENCOUNTER — Telehealth: Payer: Self-pay | Admitting: Neurology

## 2018-11-07 DIAGNOSIS — G932 Benign intracranial hypertension: Secondary | ICD-10-CM | POA: Diagnosis not present

## 2018-11-07 DIAGNOSIS — G43009 Migraine without aura, not intractable, without status migrainosus: Secondary | ICD-10-CM | POA: Diagnosis not present

## 2018-11-07 MED ORDER — RIZATRIPTAN BENZOATE 10 MG PO TBDP: 10.0000 mg | ORAL_TABLET | ORAL | 11 refills | Status: DC | PRN

## 2018-11-07 MED ORDER — OXYCODONE-ACETAMINOPHEN 10-325 MG PO TABS: 1.0000 | ORAL_TABLET | ORAL | 0 refills | Status: DC | PRN

## 2018-11-07 MED ORDER — ACETAZOLAMIDE ER 500 MG PO CP12: 500.0000 mg | ORAL_CAPSULE | Freq: Three times a day (TID) | ORAL | 4 refills | Status: DC

## 2018-11-07 NOTE — Telephone Encounter (Addendum)
Pt called. She took 1 clonazepam once last night. She asked if it was ok to try the pain medication today when she picks it up from the pharmacy. I advised if she has been doing well with no issues since last night she could take the pain medication but strongly advised pt not to take these medications together or in the same day as there is risk for adverse respiratory effects or other serious problems.   Dr. Jannifer Franklin (work-in MD) aware and agreed with advice given.

## 2018-11-07 NOTE — Telephone Encounter (Signed)
error 

## 2018-11-07 NOTE — Progress Notes (Signed)
[name]    Provider:  [name] Referring Provider: No ref. provider found Primary Care Physician:  Patient, No Pcp Per   CC: Headache and vision loss, dizziness  Virtual Visit via Video Note  I connected with Diana Page on 11/07/18 at 11:00 AM EDT by a video enabled telemedicine application and verified that I am speaking with the correct person using two identifiers.  Location: Patient: home Provider: office   I discussed the limitations of evaluation and management by telemedicine and the availability of in person appointments. The patient expressed understanding and agreed to proceed.  Follow Up Instructions:    I discussed the assessment and treatment plan with the patient. The patient was provided an opportunity to ask questions and all were answered. The patient agreed with the plan and demonstrated an understanding of the instructions.   The patient was advised to call back or seek an in-person evaluation if the symptoms worsen or if the condition fails to improve as anticipated.  I provided 25 minutes of non-face-to-face time during this encounter.   Diana Beam, MD    Interval history 11/07/2018: She was pursuing bariatric surgery and couldn;t get it due to a virus. She is going to contact it again. Motrin, tylenol not helpng when she does get a headache. She does get occ migraines, needs to take something 3x a month. We discussed choices for acute management in detail. For her migraines will try a triptan. It is pounding, throbbing, unilateral with light and sound sensitivity. Do not recommend long-term opioids for pain management or acute migraines.  Interval history 10/24/2017: She is doing well. Still on the Diamox. Doing well. She will be going to Bariatric surgery referral which I recommend as weight loss is critical due to her condition and risk of permanent vision loss. IIH is associated with obesity and bariatric surgery is  indicated in this case. No headaches, no vision changes, feel stable on medication. She is having dizziness at night which can be a symptom, needs bariatric surgery.   Interval history 01/23/2017: She has had 2 headaches in the last 6 months but it was in the setting of dehydration.  Her weight is fluctuating. She was 212 recently which is her highest. She saw [name] for her weight and she was placed on metformin and had side effects. She is watching her diet, prefers to try differently. She is going back to school and quitting work, getting married in [place]. No headaches, vision is fine no vision changes, she sees [name] every 4 months, doing well on the diamox, visual fields are stable,  No more edema of the eyes. Discussed Bariatric surgery.    Interval history 07/25/2016: 28 year old here for follow up on IIH on acetazolamide and obesity. She is getting married in November. No dizziness. Rare headaches while on the medication however she is on 1561m diamox a day and having a difficult time losing weight which is critical in IIH. Her headaches are better,  1-2 since being last seen. No vision changes, no dizziness, no hearing changes. She is following weight watchers now and she saw a dietician but still having difficulty with weight loss.  She stopped eating meat and is worried about her B12 and iron. No snoring, no morning headache. She takes 15045macetazolamide. She has missed 3 periods but 2 pregnancy tests are negative. They use condoms.  She is on 150059miamox daily and I can decrease this dose if she can  lose 10% of her body weight, we may be able to get her off of medication with even more weight loss. Will refer to the [facility] for [facility] loss. She is having a difficult time with weight loss and I recommend the [facility] [place]. Discussed the role of weight in IIH and vision loss.   Interval history 03/21/2016: 28 year old female with  papilledema, dizziness, positional headache, vision and hearing changes. Opening pressure was 34.5, improved on diamox. Abnormal MRI with abnormal signal within the medial temporal lobes bilaterally predominantly involving the hippocampal structures. She was working hard, she was not drinking water and she was lifting a lot of things and she was running all day and she felt lightheaded and dizzy and fatigued. She went and sat down for 5 minutes and she was ok. She was not drinking any water. She has one headache that lasted a whole day and a whole night it was terrible. She tried giving up caffeine. Likely caffeine withdrawal. Restared caffeine and fine. Vision has been fine. No snoring, no morning headaches. She takes 1537m acetazolamide.   Interval history 09/16/2015: She is having worsening blurry vision. This started within the last month. Things are not as crisp. No pressure. She is also hearing low level, wooshing in the ears. Worse when she is laying down and then stands. No headaches, no pressure. No side effects from the diamox. She has not been able to lose weight. Will keep her on the current dose and then if weight loss of 10% will decreasing the diamox. Need to repeat MRi of the brain due to new worsneing vision and hearing issues given previously seen Abnormal signal within the medial temporal lobes bilaterally predominantly involving the hippocampal structures.  MRI 09/26/2015: FINDINGS:  The brain parenchyma shows no significant abnormalities except subtle prominence and thickening of the right hippocampus and medial temporal lobe which is less prominent compared to the previous scan and is of unclear significance. No other structural lesion, tumor infarcts are noted. Diffusion weighted imaging is negative for acute ischemia. Susceptibility weighted imaging is negative for microhemorrhages. Subarachnoid spaces and ventricular system appear normal. Extraction brain structures up and normal.  Calvarium shows no abnormalities. The pituitary gland and cerebellar tonsils appear normal. Flow voids of the large vessels are intact and circulation appear to be patent. Orbits and paranasal sinuses appear normal. Visualized portions of the cervical spine appears normal. Contrast images do not result in any abnormal areas of enhancement.     IMPRESSION: Slightly abnormal MRI scan of the brain showing subtle prominence of the right medial temporal lobe which is of unclear significance. Overall no significant change compared with previous MRI dated 01/16/2015.  Interval update: Opening pressure of LP was 34.5. 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 the thumping and pressure in her head have improved. The peripheral vision changes are gone, no headaches. She needs follow up with ophthalmology. She needs an ophthalmologist to follow in the area, will refer to Dr. GKaty Fitch She denies any current side effects from the diamox.   HPI: ACharron Coultasis a 28y.o. female here as a referral from Dr. AJeanie Cooksfor dizziness.  PMHx obesity. She was seen in the ED for acute onset headache and dizziness with decreased peripheral vision. She had an abnormal  MRI and was called back to the ED for lumbar puncture which showed negative/normal CSF analysis. Unfortunately the LP did not include an opening pressure. She felt much better after the LP however. Currently if she stands up, she gets a thumping in the head. She gets stars in her right >Left peripheral vision. She gets pressure in the head all over. If she lays on her stomach, the pressure is worse and she can feel her heartbeat in her head. She went to optometry who diagnosed edema of the optic disks. Right eye has blurry vision which is  persistent. She has had severe headaches. The dizziness has resolved after the LP. Symptoms felt much better after LP. Denies seizure-like events, staring, altered awareness. Symptoms are daily. No other associated focal neurologic deficits. No neck pain.   Reviewed notes, labs and imaging from outside physicians, which showed: MRi brain 12/27/2014: Personally reviewed images: 1. Abnormal signal within the medial temporal lobes bilaterally predominantly involving the hippocampal structures. This is concerning for infectious process including herpes encephalitis. Other viral encephalitides can give a similar picture. Limbic encephalitis can look like this, but is typically associated with lung cancer. Alternatively, this could be related to seizure activity. The patient has no history of seizures. 2. Question abnormal signal within the anterior aspect of the right optic nerve, potentially related to same process.   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 Widening of the optic nerve sheath. This can be observed with intracranial hypertension, and is commonly seen with pseudotumor cerebri. 2. The subtle signal changes in the mesial temporal lobes appear unchanged. Although initially worrisome for an infectious or limbic encephalopathy, the stability and the absence of specific clinical symptoms make this less likely. If clinically indicated, consider follow-up evaluation or follow-up CSF analysis  Labs:   CSF: protein, glucose, diff, cell count normal TSH, B12, B1, HIV wnl CRP, ESR elevated CMP unremarkable CBC w/ anemia   Review of Systems: Patient  complains of symptoms per HPI as well as the following symptoms: No CP, no SOB. Pertinent negatives per HPI. All others negative.  Social History   Socioeconomic History   Marital status: Married    Spouse name: Not on file   Number of children: 0   Years of education: Ba   Highest education level: Not on file  Occupational History   Occupation: Librarian, academic     Comment: Tax adviser and Systems analyst strain: Not on file   Food insecurity:    Worry: Not on file    Inability: Not on file   Transportation needs:    Medical: Not on file    Non-medical: Not on file  Tobacco Use   Smoking status: Former Smoker    Types: Cigarettes, E-cigarettes    Last attempt to quit: 06/27/2009    Years since quitting: 9.3   Smokeless tobacco: Never Used  Substance and Sexual Activity   Alcohol use: Yes    Alcohol/week: 0.0 standard drinks    Comment: occasionally    Drug use: No   Sexual activity: Not Currently    Partners: Male    Birth control/protection: Condom, Pill  Lifestyle   Physical activity:  Days per week: Not on file    Minutes per session: Not on file   Stress: Not on file  Relationships   Social connections:    Talks on phone: Not on file    Gets together: Not on file    Attends religious service: Not on file    Active member of club or organization: Not on file    Attends meetings of clubs or organizations: Not on file    Relationship status: Not on file   Intimate partner violence:    Fear of current or ex partner: Not on file    Emotionally abused: Not on file    Physically abused: Not on file    Forced sexual activity: Not on file  Other Topics Concern   Not on file  Social History Narrative   Lives at home with husband, Lennette Bihari.   Caffeine use: 2-3 cups daily   Right handed    Family History  Problem Relation Age of Onset   OCD Mother    Diabetes Mother    Depression Mother    Anxiety disorder Mother    Obesity  Mother    Obesity Father    OCD Sister    Depression Sister    OCD Brother    Anxiety disorder Brother     Past Medical History:  Diagnosis Date   Anemia    Anxiety    Back pain    Depression    Fatty liver    Gallbladder problem    GERD (gastroesophageal reflux disease)    Hypertension    Intercranial Hypertension   IBS (irritable bowel syndrome)    Joint pain    Obsessive compulsive disorder 06/14   Ostomy nurse consultation    Pancreatitis    Pancreatitis    hx of    SOB (shortness of breath)    UC (ulcerative colitis) ([place])     Past Surgical History:  Procedure Laterality Date   CHOLECYSTECTOMY     LAPAROSCOPIC PARTIAL PROTECTOMY     PERMANENT ILEOSTOMY     TOTAL COLECTOMY N/A     Current Outpatient Medications  Medication Sig Dispense Refill   acetaminophen (TYLENOL) 500 MG tablet Take 500-1,000 mg by mouth every 6 (six) hours as needed (for pain).     acetaZOLAMIDE (DIAMOX) 500 MG capsule TAKE 1 CAPSULE (500 MG TOTAL) BY MOUTH 3 (THREE) TIMES DAILY. 270 capsule 11   calcium carbonate (TUMS - DOSED IN MG ELEMENTAL CALCIUM) 500 MG chewable tablet Chew 1-2 tablets by mouth as needed for indigestion or heartburn.     clonazePAM (KLONOPIN) 0.5 MG tablet Take 1 tablet at bedtime.  May need second if needed for insomnia and anxiety. (Patient not taking: Reported on 11/06/2018) 45 tablet 1   fluvoxaMINE (LUVOX) 25 MG tablet TAKE 1 TABLET BY MOUTH EVERY MORNING 30 tablet 2   Incontinence Supplies (DRAINABLE FECAL COLLECTOR) MISC by Does not apply route.     OLANZapine-FLUoxetine (SYMBYAX) 6-50 MG capsule Take 1 capsule by mouth daily. 30 capsule 2   ondansetron (ZOFRAN ODT) 4 MG disintegrating tablet Take 1 tablet (4 mg total) by mouth every 8 (eight) hours as needed for nausea or vomiting. (Patient not taking: Reported on 11/06/2018) 20 tablet 0   Ostomy Supplies MISC by Does not apply route.     promethazine (PHENERGAN) 25 MG tablet  Take 1 tablet (25 mg total) by mouth every 6 (six) hours as needed for nausea or vomiting. (Patient not taking: Reported on  11/06/2018) 30 tablet 0   No current facility-administered medications for this visit.     Allergies as of 11/07/2018 - Review Complete 11/06/2018  Allergen Reaction Noted   Nsaids Other (See Comments) 07/17/2014   Tolmetin Other (See Comments) 10/14/2014    Vitals: LMP 10/29/2018 (Within Days)  Last Weight:  Wt Readings from Last 1 Encounters:  11/06/18 240 lb (108.9 kg)   Last Height:   Ht Readings from Last 1 Encounters:  11/06/18 5' 2"  (1.575 m)   Physical exam: Exam: Gen: NAD, conversant, well nourised, obese, well groomed                     Eyes: Conjunctivae clear without exudates or hemorrhage  Neuro: Detailed Neurologic Exam  Speech:    Speech is normal; fluent and spontaneous with normal comprehension.  Cognition:    The patient is oriented to person, place, and time;   Cranial Nerves:    The pupils are equal, round, and reactive to light. The fundi are stable, mild nasal blurring. Visual fields are full to finger confrontation. Extraocular movements are intact. Trigeminal sensation is intact and the muscles of mastication are normal. The face is symmetric. The palate elevates in the midline. Hearing intact. Voice is normal. Shoulder shrug is normal. The tongue has normal motion without fasciculations.   Motor Observation:    No asymmetry, no atrophy, and no involuntary movements noted. Tone:    Normal muscle tone.    Posture:    Posture is normal. normal erect    Strength:    Strength is V/V in the upper and lower limbs.       Assessment/Plan: 28year old female with papilledema, dizziness, positional headache, vision and hearing changes, obesity. Opening pressure was 34.5, improved on diamox but having difficulty with weight loss. Abnormal MRI with abnormal signal within the medial temporal lobes bilaterally predominantly  involving the hippocampal structures. 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 Repeat MRi showed subtle signal changes in the mesial temporal lobes unchanged, although initially worrisome for an infectious or limbic encephalopathy, the stability and the absence of specific clinical symptoms make this less likely.  Discussed weight loss as is critical and discussed weight in the pathophysiology of IIH; refer to Bariatric surgery, bariatric surgery is indicated due to her condition Continue diamox. Discussed teratogenicity, she is getting married next year, advised weight loss before children, although diamox is generally considered compatible with pregnancy no medication is entirely safe. Janeal Holmes advised we can decrease the medication if she loses 10% of body weight and we may be able to entirely stop the diamox with a healthy weight.  - Should proceed to ED if vision significantly worsens - Follow up with [name] was very positive, she sees him regularly - She is in the process of bariatric surgery - maxalt acutely. Will give one prescription of percocet, no refills, fo severe refractory headaches due to her IIH try maxalt first, do not take with benzos, discussed risk of resp depressiona nd death.  Discussed: To prevent or relieve headaches, try the following: Cool Compress. Lie down and place a cool compress on your head.  Avoid headache triggers. If certain foods or odors seem to have triggered your migraines  in the past, avoid them. A headache diary might help you identify triggers.  Include physical activity in your daily routine. Try a daily walk or other moderate aerobic exercise.    Manage stress. Find healthy ways to cope with the stressors, such as delegating tasks on your to-do list.  Practice relaxation techniques. Try deep breathing, yoga, massage and visualization.  Eat regularly. Eating regularly scheduled meals and maintaining a healthy diet might help prevent headaches. Also, drink plenty of fluids.  Follow a regular sleep schedule. Sleep deprivation might contribute to headaches Consider biofeedback. With this mind-body technique, you learn to control certain bodily functions -- such as muscle tension, heart rate and blood pressure -- to prevent headaches or reduce headache pain.    Proceed to emergency room if you experience new or worsening symptoms or symptoms do not resolve, if you have new neurologic symptoms or if headache is severe, or for any concerning symptom.   Provided education and documentation from American headache Society toolbox including articles on: chronic migraine medication overuse headache, chronic migraines, prevention of migraines, behavioral and other nonpharmacologic treatments for headache.    Sarina Ill, MD  [facility] [address] [place], [place] [address]  Phone [phone] Fax [phone]

## 2018-11-08 ENCOUNTER — Telehealth: Payer: Self-pay | Admitting: *Deleted

## 2018-11-08 NOTE — Telephone Encounter (Signed)
I called the patient and LVM asking for call back to schedule a yearly f/u next May (2021). Left office number in message.   Yearly f/u requested by Dr. Jaynee Eagles. Last appt 11/07/2018.

## 2019-01-02 ENCOUNTER — Telehealth: Payer: Self-pay | Admitting: Neurology

## 2019-01-02 NOTE — Telephone Encounter (Signed)
Pt also sent mychart message with this request. Forwarded to Dr. Jaynee Eagles.

## 2019-01-02 NOTE — Telephone Encounter (Signed)
Patient called and requested to get a refill on rx Percocet. DW

## 2019-01-03 ENCOUNTER — Other Ambulatory Visit: Payer: Self-pay | Admitting: Neurology

## 2019-01-03 MED ORDER — OXYCODONE-ACETAMINOPHEN 10-325 MG PO TABS: 1.0000 | ORAL_TABLET | ORAL | 0 refills | Status: DC | PRN

## 2019-01-21 ENCOUNTER — Ambulatory Visit: Payer: BLUE CROSS/BLUE SHIELD | Admitting: Neurology

## 2019-02-07 ENCOUNTER — Other Ambulatory Visit: Payer: Self-pay

## 2019-02-07 ENCOUNTER — Ambulatory Visit (INDEPENDENT_AMBULATORY_CARE_PROVIDER_SITE_OTHER): Payer: BC Managed Care – PPO | Admitting: Psychiatry

## 2019-02-07 ENCOUNTER — Encounter (HOSPITAL_COMMUNITY): Payer: Self-pay | Admitting: Psychiatry

## 2019-02-07 DIAGNOSIS — F330 Major depressive disorder, recurrent, mild: Secondary | ICD-10-CM

## 2019-02-07 DIAGNOSIS — F411 Generalized anxiety disorder: Secondary | ICD-10-CM | POA: Diagnosis not present

## 2019-02-07 DIAGNOSIS — F429 Obsessive-compulsive disorder, unspecified: Secondary | ICD-10-CM | POA: Diagnosis not present

## 2019-02-07 MED ORDER — FLUVOXAMINE MALEATE 25 MG PO TABS: ORAL_TABLET | ORAL | 2 refills | Status: DC

## 2019-02-07 MED ORDER — OLANZAPINE-FLUOXETINE HCL 6-50 MG PO CAPS: 1.0000 | ORAL_CAPSULE | Freq: Every day | ORAL | 2 refills | Status: DC

## 2019-02-07 NOTE — Progress Notes (Signed)
Virtual Visit via Telephone Note  I connected with Diana Page on 02/07/19 at 10:00 AM EDT by telephone and verified that I am speaking with the correct person using two identifiers.   I discussed the limitations, risks, security and privacy concerns of performing an evaluation and management service by telephone and the availability of in person appointments. I also discussed with the patient that there may be a patient responsible charge related to this service. The patient expressed understanding and agreed to proceed.   History of Present Illness: Patient was evaluated through phone session.  She is doing much better since taking the Luvox 6 months ago.  Her obsessive thoughts are stable.  She does not spend too much time checking and counting things.  She is sleeping better.  She has taken 1 or 2 times Klonopin which took some time but did help.  On her last visit we stopped the Xanax and try Klonopin because she felt Xanax were making her sleepy and groggy.  Overall things are going well.  She also resume therapy virtually.  Her therapist is in [place].  She excited as she has upcoming appointment to see a nutritionist which is in the process of getting bariatric surgery.  She lives with her husband who is supportive.  She denies any paranoia, hallucination, crying spells or any feeling of hopelessness or worthlessness.  She reported her weight is same.  Her energy level is okay.  She is sleeping better and like to continue her current medication.   Past Psychiatric History:Reviewed. H/O depression andOCD. TriedProzac when she was in [place]. No H/Ohallucination, paranoia, suicidal attempt or any psychiatric inpatient treatment. Hada good response with Symbyax.TriedXanax but it make her tired and sleepy.   Psychiatric Specialty Exam: Physical Exam  ROS  There were no vitals taken for this visit.There is no height or weight on file to calculate BMI.  General Appearance: NA   Eye Contact:  NA  Speech:  Clear and Coherent and Normal Rate  Volume:  Normal  Mood:  Anxious  Affect:  NA  Thought Process:  Goal Directed  Orientation:  Full (Time, Place, and Person)  Thought Content:  Obsessions  Suicidal Thoughts:  No  Homicidal Thoughts:  No  Memory:  Immediate;   Good Recent;   Good Remote;   Good  Judgement:  Good  Insight:  Good  Psychomotor Activity:  NA  Concentration:  Concentration: Good and Attention Span: Good  Recall:  Good  Fund of Knowledge:  Good  Language:  Good  Akathisia:  No  Handed:  Right  AIMS (if indicated):     Assets:  Communication Skills Desire for Improvement Housing Resilience Social Support Transportation  ADL's:  Intact  Cognition:  WNL  Sleep:         Assessment and Plan: Obsessive-compulsive disorder.  Generalized anxiety disorder.  Major depressive disorder, recurrent.  Patient is doing better on her current medication.  I will continue Luvox 25 mg in the morning and Symbyax 9/50 mg at bedtime.  Discussed medication side effects and benefits.  She has no tremors, shakes or any EPS.  She still has Klonopin refill remaining and does not need a new prescription.  Recommended to call us back if she has any question or any concern.  Follow-up in 3 months.  Follow Up Instructions:    I discussed the assessment and treatment plan with the patient. The patient was provided an opportunity to ask questions and all were answered.  The patient agreed with the plan and demonstrated an understanding of the instructions.   The patient was advised to call back or seek an in-person evaluation if the symptoms worsen or if the condition fails to improve as anticipated.  I provided 20 minutes of non-face-to-face time during this encounter.   Kathlee Nations, MD

## 2019-02-18 ENCOUNTER — Ambulatory Visit: Payer: BC Managed Care – PPO | Admitting: Neurology

## 2019-02-18 ENCOUNTER — Encounter: Payer: Self-pay | Admitting: Neurology

## 2019-02-18 ENCOUNTER — Other Ambulatory Visit: Payer: Self-pay

## 2019-02-18 VITALS — BP 120/86 | HR 88 | Temp 98.0°F | Ht 63.0 in | Wt 236.0 lb

## 2019-02-18 DIAGNOSIS — H4710 Unspecified papilledema: Secondary | ICD-10-CM

## 2019-02-18 DIAGNOSIS — G43711 Chronic migraine without aura, intractable, with status migrainosus: Secondary | ICD-10-CM | POA: Diagnosis not present

## 2019-02-18 DIAGNOSIS — G441 Vascular headache, not elsewhere classified: Secondary | ICD-10-CM

## 2019-02-18 DIAGNOSIS — R51 Headache with orthostatic component, not elsewhere classified: Secondary | ICD-10-CM

## 2019-02-18 DIAGNOSIS — G4484 Primary exertional headache: Secondary | ICD-10-CM

## 2019-02-18 DIAGNOSIS — R42 Dizziness and giddiness: Secondary | ICD-10-CM

## 2019-02-18 DIAGNOSIS — R519 Headache, unspecified: Secondary | ICD-10-CM

## 2019-02-18 MED ORDER — AJOVY 225 MG/1.5ML ~~LOC~~ SOAJ: 225.0000 mg | SUBCUTANEOUS | 11 refills | Status: DC

## 2019-02-18 NOTE — Progress Notes (Signed)
[name]    Provider:  [name] Referring Provider: No ref. provider found Primary Care Physician:  Patient, No Pcp Per   CC: Headache and vision loss, dizziness  Interval history 02/18/2019:  worsening headaches. She is now having 2 migraines days a week or more, more hedaches. Pounding, throbbing, unilateral, nausea, light/sound sensitivity, worse with movement, sitting still in a dark room, bending over makes it pound worse.  This is been ongoing for the last 6 months.  She is worried because she is also having positional headaches, and exertional headaches when she sneezes, she has a history of idiopathic intracranial hypertension and this makes her worry for for other etiologies.  Waking up with headaches now.  She has a history of abnormal enhancement in the brain last was 2017 we will repeat an MRI.  I discussed with her that this may be progression of migraines, she is very compliant with her Diamox and she has not gained weight.  In fact she is going to have bariatric surgery.  Given symptoms we will order MRI of the brain.  We will also start her on CGRP.  Interval history 11/07/2018: She was pursuing bariatric surgery and couldn;t get it due to a virus. She is going to contact it again. Motrin, tylenol not helpng when she does get a headache. She does get occ migraines, needs to take something 3x a month. We discussed choices for acute management in detail. For her migraines will try a triptan. It is pounding, throbbing, unilateral with light and sound sensitivity. Do not recommend long-term opioids for pain management or acute migraines.  Interval history 10/24/2017: She is doing well. Still on the Diamox. Doing well. She will be going to Bariatric surgery referral which I recommend as weight loss is critical due to her condition and risk of permanent vision loss. IIH is associated with obesity and bariatric surgery is indicated in this case. No headaches, no vision  changes, feel stable on medication. She is having dizziness at night which can be a symptom, needs bariatric surgery.   Interval history 01/23/2017: She has had 2 headaches in the last 6 months but it was in the setting of dehydration.  Her weight is fluctuating. She was 212 recently which is her highest. She saw [name] for her weight and she was placed on metformin and had side effects. She is watching her diet, prefers to try differently. She is going back to school and quitting work, getting married in [place]. No headaches, vision is fine no vision changes, she sees [name] every 4 months, doing well on the diamox, visual fields are stable,  No more edema of the eyes. Discussed Bariatric surgery.    Interval history 07/25/2016: 28 year old here for follow up on IIH on acetazolamide and obesity. She is getting married in November. No dizziness. Rare headaches while on the medication however she is on 1563m diamox a day and having a difficult time losing weight which is critical in IIH. Her headaches are better,  1-2 since being last seen. No vision changes, no dizziness, no hearing changes. She is following weight watchers now and she saw a dietician but still having difficulty with weight loss.  She stopped eating meat and is worried about her B12 and iron. No snoring, no morning headache. She takes 15030macetazolamide. She has missed 3 periods but 2 pregnancy tests are negative. They use condoms.  She is on 150036miamox daily and I can decrease this  dose if she can lose 10% of her body weight, we may be able to get her off of medication with even more weight loss. Will refer to the [facility] for weight loss. She is having a difficult time with weight loss and I recommend the [facility] [place]. Discussed the role of weight in IIH and vision loss.   Interval history 03/21/2016: 28 year old female with papilledema, dizziness, positional headache, vision  and hearing changes. Opening pressure was 34.5, improved on diamox. Abnormal MRI with abnormal signal within the medial temporal lobes bilaterally predominantly involving the hippocampal structures. She was working hard, she was not drinking water and she was lifting a lot of things and she was running all day and she felt lightheaded and dizzy and fatigued. She went and sat down for 5 minutes and she was ok. She was not drinking any water. She has one headache that lasted a whole day and a whole night it was terrible. She tried giving up caffeine. Likely caffeine withdrawal. Restared caffeine and fine. Vision has been fine. No snoring, no morning headaches. She takes 1528m acetazolamide.   Interval history 09/16/2015: She is having worsening blurry vision. This started within the last month. Things are not as crisp. No pressure. She is also hearing low level, wooshing in the ears. Worse when she is laying down and then stands. No headaches, no pressure. No side effects from the diamox. She has not been able to lose weight. Will keep her on the current dose and then if weight loss of 10% will decreasing the diamox. Need to repeat MRi of the brain due to new worsneing vision and hearing issues given previously seen Abnormal signal within the medial temporal lobes bilaterally predominantly involving the hippocampal structures.  MRI 09/26/2015: FINDINGS:  The brain parenchyma shows no significant abnormalities except subtle prominence and thickening of the right hippocampus and medial temporal lobe which is less prominent compared to the previous scan and is of unclear significance. No other structural lesion, tumor infarcts are noted. Diffusion weighted imaging is negative for acute ischemia. Susceptibility weighted imaging is negative for microhemorrhages. Subarachnoid spaces and ventricular system appear normal. Extraction brain structures up and normal. Calvarium shows no abnormalities. The pituitary gland  and cerebellar tonsils appear normal. Flow voids of the large vessels are intact and circulation appear to be patent. Orbits and paranasal sinuses appear normal. Visualized portions of the cervical spine appears normal. Contrast images do not result in any abnormal areas of enhancement.     IMPRESSION: Slightly abnormal MRI scan of the brain showing subtle prominence of the right medial temporal lobe which is of unclear significance. Overall no significant change compared with previous MRI dated 01/16/2015.  Interval update: Opening pressure of LP was 34.5. 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 the thumping and pressure in her head have improved. The peripheral vision changes are gone, no headaches. She needs follow up with ophthalmology. She needs an ophthalmologist to follow in the area, will refer to Dr. GKaty Fitch She denies any current side effects from the diamox.   HPI: Diana Surrattis a 28y.o. female here as a referral from Dr. AJeanie Cooksfor dizziness.  PMHx obesity. She was seen in the ED for acute onset headache and dizziness with decreased peripheral vision.  She had an abnormal MRI and was called back to the ED for lumbar puncture which showed negative/normal CSF analysis. Unfortunately the LP did not include an opening pressure. She felt much better after the LP however. Currently if she stands up, she gets a thumping in the head. She gets stars in her right >Left peripheral vision. She gets pressure in the head all over. If she lays on her stomach, the pressure is worse and she can feel her heartbeat in her head. She went to optometry who diagnosed edema of the optic disks. Right eye has blurry vision which is persistent. She has had severe headaches. The dizziness has  resolved after the LP. Symptoms felt much better after LP. Denies seizure-like events, staring, altered awareness. Symptoms are daily. No other associated focal neurologic deficits. No neck pain.   Reviewed notes, labs and imaging from outside physicians, which showed: MRi brain 12/27/2014: Personally reviewed images: 1. Abnormal signal within the medial temporal lobes bilaterally predominantly involving the hippocampal structures. This is concerning for infectious process including herpes encephalitis. Other viral encephalitides can give a similar picture. Limbic encephalitis can look like this, but is typically associated with lung cancer. Alternatively, this could be related to seizure activity. The patient has no history of seizures. 2. Question abnormal signal within the anterior aspect of the right optic nerve, potentially related to same process.   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 Widening of the optic nerve sheath. This can be observed with intracranial hypertension, and is commonly seen with pseudotumor cerebri. 2. The subtle signal changes in the mesial temporal lobes appear unchanged. Although initially worrisome for an infectious or limbic encephalopathy, the stability and the absence of specific clinical symptoms make this less likely. If clinically indicated, consider follow-up evaluation or follow-up CSF analysis  Labs:   CSF: protein, glucose, diff, cell count normal TSH, B12, B1, HIV wnl CRP, ESR elevated CMP unremarkable CBC w/ anemia   Review of Systems: Patient complains of symptoms per HPI as well as the following symptoms:  No CP, no SOB. Pertinent negatives per HPI. All others negative.  Social History   Socioeconomic History   Marital status: Married    Spouse name: Not on file   Number of children: 0   Years of education: Ba   Highest education level: Not on file  Occupational History   Occupation: Librarian, academic     Comment: Tax adviser and Systems analyst strain: Not on file   Food insecurity    Worry: Not on file    Inability: Not on file   Transportation needs    Medical: Not on file    Non-medical: Not on file  Tobacco Use   Smoking status: Former Smoker    Types: Cigarettes, E-cigarettes    Quit date: 06/27/2009    Years since quitting: 9.6   Smokeless tobacco: Never Used  Substance and Sexual Activity   Alcohol use: Yes    Alcohol/week: 0.0 standard drinks    Comment: occasionally    Drug use: No   Sexual activity: Not Currently    Partners: Male    Birth control/protection: Condom  Lifestyle   Physical activity  Days per week: Not on file    Minutes per session: Not on file   Stress: Not on file  Relationships   Social connections    Talks on phone: Not on file    Gets together: Not on file    Attends religious service: Not on file    Active member of club or organization: Not on file    Attends meetings of clubs or organizations: Not on file    Relationship status: Not on file   Intimate partner violence    Fear of current or ex partner: Not on file    Emotionally abused: Not on file    Physically abused: Not on file    Forced sexual activity: Not on file  Other Topics Concern   Not on file  Social History Narrative   Lives at home with husband, Lennette Bihari.   Caffeine use: 2-3 cups daily   Right handed    Family History  Problem Relation Age of Onset   OCD Mother    Diabetes Mother    Depression Mother    Anxiety disorder Mother    Obesity Mother    Obesity Father    OCD Sister    Depression Sister    OCD Brother      Anxiety disorder Brother     Past Medical History:  Diagnosis Date   Anemia    Anxiety    Back pain    Depression    Fatty liver    Gallbladder problem    GERD (gastroesophageal reflux disease)    Hypertension    Intercranial Hypertension   IBS (irritable bowel syndrome)    Joint pain    Obsessive compulsive disorder 06/14   Ostomy nurse consultation    Pancreatitis    Pancreatitis    hx of    SOB (shortness of breath)    UC (ulcerative colitis) ([place])     Past Surgical History:  Procedure Laterality Date   CHOLECYSTECTOMY     LAPAROSCOPIC PARTIAL PROTECTOMY     PERMANENT ILEOSTOMY     TOTAL COLECTOMY N/A     Current Outpatient Medications  Medication Sig Dispense Refill   acetaZOLAMIDE (DIAMOX) 500 MG capsule Take 1 capsule (500 mg total) by mouth 3 (three) times daily. 270 capsule 4   clonazePAM (KLONOPIN) 0.5 MG tablet Take 1 tablet at bedtime.  May need second if needed for insomnia and anxiety. 45 tablet 1   fluvoxaMINE (LUVOX) 25 MG tablet TAKE 1 TABLET BY MOUTH EVERY MORNING 30 tablet 2   OLANZapine-FLUoxetine (SYMBYAX) 6-50 MG capsule Take 1 capsule by mouth daily. 30 capsule 2   acetaminophen (TYLENOL) 500 MG tablet Take 500-1,000 mg by mouth every 6 (six) hours as needed (for pain).     Fremanezumab-vfrm (AJOVY) 225 MG/1.5ML SOAJ Inject 225 mg into the skin every 30 (thirty) days. 1 pen 11   Incontinence Supplies (DRAINABLE FECAL COLLECTOR) MISC by Does not apply route.     ondansetron (ZOFRAN ODT) 4 MG disintegrating tablet Take 1 tablet (4 mg total) by mouth every 8 (eight) hours as needed for nausea or vomiting. (Patient not taking: Reported on 11/06/2018) 20 tablet 0   Ostomy Supplies MISC by Does not apply route.     promethazine (PHENERGAN) 25 MG tablet Take 1 tablet (25 mg total) by mouth every 6 (six) hours as needed for nausea or vomiting. (Patient not taking: Reported on 11/06/2018) 30 tablet 0   rizatriptan (MAXALT-MLT)  10 MG disintegrating tablet Take  1 tablet (10 mg total) by mouth as needed for migraine. May repeat in 2 hours if needed (Patient not taking: Reported on 02/18/2019) 9 tablet 11   No current facility-administered medications for this visit.     Allergies as of 02/18/2019 - Review Complete 02/18/2019  Allergen Reaction Noted   Nsaids Other (See Comments) 07/17/2014   Tolmetin Other (See Comments) 10/14/2014    Vitals: BP 120/86 (BP Location: Left Arm, Patient Position: Sitting)    Pulse 88    Temp 98 F (36.7 C) Comment: taken by check-in staff   Ht _0  (1.6 m)    Wt 236 lb (107 kg)    BMI 41.81 kg/m  Last Weight:  Wt Readings from Last 1 Encounters:  02/18/19 236 lb (107 kg)   Last Height:   Ht Readings from Last 1 Encounters:  02/18/19 _1  (1.6 m)   Physical exam: Exam: Gen: NAD, conversant, well nourised, obese, well groomed                     Eyes: Conjunctivae clear without exudates or hemorrhage  Neuro: Detailed Neurologic Exam  Speech:    Speech is normal; fluent and spontaneous with normal comprehension.  Cognition:    The patient is oriented to person, place, and time;   Cranial Nerves:    The pupils are equal, round, and reactive to light. The fundi are stable, mild nasal blurring. Visual fields are full to finger confrontation. Extraocular movements are intact. Trigeminal sensation is intact and the muscles of mastication are normal. The face is symmetric. The palate elevates in the midline. Hearing intact. Voice is normal. Shoulder shrug is normal. The tongue has normal motion without fasciculations.   Motor Observation:    No asymmetry, no atrophy, and no involuntary movements noted. Tone:    Normal muscle tone.    Posture:    Posture is normal. normal erect    Strength:    Strength is V/V in the upper and lower limbs.       Assessment/Plan: 12 old female with papilledema, dizziness, positional headache, vision and hearing changes, obesity.  Opening pressure was 34.5, improved on diamox but having difficulty with weight loss. Abnormal MRI with abnormal signal within the medial temporal lobes bilaterally predominantly involving the hippocampal structures. 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 Repeat MRi showed subtle signal changes in the mesial temporal lobes unchanged, although initially worrisome for an infectious or limbic encephalopathy, the stability and the absence of specific clinical symptoms make this less likely.  -Patient has been stable for years however over the last 6+ months she has had worsening headaches, with concerning symptoms of positional quality, exertional quality, dizziness, vertigo given these concerning symptoms I do think it is time to repeat an MRI of the brain in light of her past with intracranial hypertension and abnormal signal in the brain.  May be progression of migraines but need to rule out other intracranial etiologies: space occupying mass, chiari or intracranial hypertension (pseudotumor), other intracerebral etiology.   Diana Page for migraines: tried topamax, diamox, amitriptyline  Discussed weight loss as is critical and discussed weight in the pathophysiology of IIH; refer to Bariatric surgery, bariatric surgery is indicated due to  her condition Continue diamox. Discussed teratogenicity, she is getting married next year, advised weight loss before children, although diamox is generally considered compatible with pregnancy no medication is entirely safe. Diana Page advised we can decrease the medication if she loses 10% of body weight and we may be able to entirely stop the diamox with a healthy  weight.   - Should proceed to ED if vision significantly worsens - Follow ups with [name] have been very positive, she sees him regularly - She is in the process of bariatric surgery - maxalt acutely. For migraines - Migraines: start ajovy  Orders Placed This Encounter  Procedures   MR BRAIN W WO CONTRAST   Meds ordered this encounter  Medications   Fremanezumab-vfrm (AJOVY) 225 MG/1.5ML SOAJ    Sig: Inject 225 mg into the skin every 30 (thirty) days.    Dispense:  1 pen    Refill:  11    Patient has copay card; she can have medication for $5 regardless of insurance approval or copay amount.     Discussed: To prevent or relieve headaches, try the following: Cool Compress. Lie down and place a cool compress on your head.  Avoid headache triggers. If certain foods or odors seem to have triggered your migraines in the past, avoid them. A headache diary might help you identify triggers.  Include physical activity in your daily routine. Try a daily walk or other moderate aerobic exercise.  Manage stress. Find healthy ways to cope with the stressors, such as delegating tasks on your to-do list.  Practice relaxation techniques. Try deep breathing, yoga, massage and visualization.  Eat regularly. Eating regularly scheduled meals and maintaining a healthy diet might help prevent headaches. Also, drink plenty of fluids.  Follow a regular sleep schedule. Sleep deprivation might contribute to headaches Consider biofeedback. With this mind-body technique, you learn to control certain bodily functions -- such as muscle tension, heart rate and blood pressure -- to prevent headaches or reduce headache pain.    Proceed to emergency room if you experience new or worsening symptoms or symptoms do not resolve, if you have new neurologic symptoms or if headache is severe, or for any concerning symptom.   Provided education and documentation from American headache Society toolbox including articles  on: chronic migraine medication overuse headache, chronic migraines, prevention of migraines, behavioral and other nonpharmacologic treatments for headache.   A total of 25 minutes was spent face-to-face with this patient. Over half this time was spent on counseling patient on the  1. Chronic migraine without aura, with intractable migraine, so stated, with status migrainosus   2. Other vascular headache   3. Positional headache   4. Morning headache   5. Exertional headache   6. Papilledema   7. Dizziness   8. Vertigo    diagnosis and different diagnostic and therapeutic options, counseling and coordination of care, risks ans benefits of management, compliance, or risk factor reduction and education.    Diana Ill, MD  [facility] [address] [place], [place] [address]  Phone [phone] Fax [phone]

## 2019-02-18 NOTE — Patient Instructions (Signed)
MRI of the brain Start Ajovy  Fremanezumab injection What is this medicine? FREMANEZUMAB (fre ma NEZ ue mab) is used to prevent migraine headaches. This medicine may be used for other purposes; ask your health care provider or pharmacist if you have questions. COMMON BRAND NAME(S): AJOVY What should I tell my health care provider before I take this medicine? They need to know if you have any of these conditions:  an unusual or allergic reaction to fremanezumab, other medicines, foods, dyes, or preservatives  pregnant or trying to get pregnant  breast-feeding How should I use this medicine? This medicine is for injection under the skin. You will be taught how to prepare and give this medicine. Use exactly as directed. Take your medicine at regular intervals. Do not take your medicine more often than directed. It is important that you put your used needles and syringes in a special sharps container. Do not put them in a trash can. If you do not have a sharps container, call your pharmacist or healthcare provider to get one. Talk to your pediatrician regarding the use of this medicine in children. Special care may be needed. Overdosage: If you think you have taken too much of this medicine contact a poison control center or emergency room at once. NOTE: This medicine is only for you. Do not share this medicine with others. What if I miss a dose? If you miss a dose, take it as soon as you can. If it is almost time for your next dose, take only that dose. Do not take double or extra doses. What may interact with this medicine? Interactions are not expected. This list may not describe all possible interactions. Give your health care provider a list of all the medicines, herbs, non-prescription drugs, or dietary supplements you use. Also tell them if you smoke, drink alcohol, or use illegal drugs. Some items may interact with your medicine. What should I watch for while using this medicine? Tell  your doctor or healthcare professional if your symptoms do not start to get better or if they get worse. What side effects may I notice from receiving this medicine? Side effects that you should report to your doctor or health care professional as soon as possible:  allergic reactions like skin rash, itching or hives, swelling of the face, lips, or tongue Side effects that usually do not require medical attention (report these to your doctor or health care professional if they continue or are bothersome):  pain, redness, or irritation at site where injected This list may not describe all possible side effects. Call your doctor for medical advice about side effects. You may report side effects to FDA at 1-800-FDA-1088. Where should I keep my medicine? Keep out of the reach of children. You will be instructed on how to store this medicine. Throw away any unused medicine after the expiration date on the label. NOTE: This sheet is a summary. It may not cover all possible information. If you have questions about this medicine, talk to your doctor, pharmacist, or health care provider.  2020 Elsevier/Gold Standard (2017-03-13 17:22:56)

## 2019-02-19 ENCOUNTER — Telehealth: Payer: Self-pay | Admitting: Neurology

## 2019-02-19 NOTE — Telephone Encounter (Signed)
LVM for pt to call back about scheduling mri  [employer] Auth: [id number] (exp. 02/19/19 to 03/20/19)

## 2019-02-20 ENCOUNTER — Encounter: Payer: Self-pay | Admitting: *Deleted

## 2019-02-20 ENCOUNTER — Telehealth: Payer: Self-pay | Admitting: *Deleted

## 2019-02-20 NOTE — Telephone Encounter (Addendum)
Started PA for Ajovy on CMM, key: [id number], dx: G 43.711. Failed triptans. Cannot take NSAIDS due to ulcerative colitis. She is on antidepressant. 8+ migraine headaches a month.   Case RE:[id number]; Status:Approved: Prior Auth; Coverage Start Date:01/21/2019; Coverage End Date:02/20/2020  Approval letter faxed to [facility], [place]. Sent patient my chart message to make her aware.

## 2019-02-28 ENCOUNTER — Encounter: Payer: Self-pay | Admitting: Skilled Nursing Facility1

## 2019-02-28 ENCOUNTER — Other Ambulatory Visit: Payer: Self-pay

## 2019-02-28 ENCOUNTER — Encounter: Payer: BC Managed Care – PPO | Attending: Surgery | Admitting: Skilled Nursing Facility1

## 2019-02-28 DIAGNOSIS — E669 Obesity, unspecified: Secondary | ICD-10-CM | POA: Insufficient documentation

## 2019-02-28 NOTE — Progress Notes (Signed)
Bariatric Pre-Op Nutrition Assessment for Planned Sleeve Surgery Medical Nutrition Therapy  Appt Start Time: 2:15pm  End time: 3:15pm  Patient was seen on 02/28/2019 for Pre-Operative Nutrition Assessment. Assessment and letter of approval faxed to [facility] Surgery Bariatric Surgery Program coordinator on 02/28/2019.   Pt expectation of surgery: to be a healthy enough weight to have a baby  Pt expectation of dietitian: None stated.   Anthropometrics  Start weight at [facility]: 239 lbs Height: 62.5 in BMI: 43 kg/m2    Clinical  Medical Hx: obesity, ulcerative colitis, pancreatitis, IBS, GERD, intercranial hypertension, depression, anxiety  Surgeries: cholecystectomy, colectomy, ileostomy, partial proctectomy  Allergies: NSAIDS Medications: Symbyax, diamox, luvox, klonopin   Psychosocial/Lifestyle Pt lives with her husband and 2 dogs, previously lived in [place]. 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   24-Hr Dietary Recall First Meal: cereal or breakfast burrito Snack: none Second Meal: dinner Snack: none Third Meal: meat and broccoli Snack: none  Beverages: milk + Powerade Zero + 1 gallon water with packets + diet soda (lessened now with drinking more water)  Food & Nutrition Related Hx Dietary Hx:   Pt state she wants to have a child and cannot gain weight without going blind due to her intracranial hypertension. Pt states since her colectomy she has felt so much better and the relief was immediate. Pt states she used to binge and purge up until about a year ago stating she has not done it in 1 year and states this was due to food guilt which has lessened. Pt states she is a Highly emotional eater. Pt staets she feels she has some trauma from her mother calling foods good or bad when her Mom closeting eating all the foods he told her were bad and would make her fat. Pt states she also feels she has Trauma from food from before her  colectomy most "healthy" foods causing severe pain and diarrhea and blood. Pt states green beans are painful (canned are less painful). Pt states she does not know how to cook and is now learning with her husband teaching her and she is excited about that.   Estimated Daily Fluid Intake: 1 gallon  Physical Activity  Current average weekly physical activity: none  Estimated Energy Needs Calories: 2000 Carbohydrate: 250g Protein: 150g Fat: 45g  Pre-Op Goals Reviewed with the Patient        Try a protein shake . Track food and beverage intake (try MyFitness Pal or the Baritastic app) . Make healthy food choices . Avoid concentrated sugars and fried foods . Keep fat & sugar in the single digits per serving on food labels . Practice CHEWING your food (aim for applesauce consistency)/eat at the table  . Practice not drinking 15 minutes before, during, and 30 minutes after each meal and snack . Avoid all carbonated beverages (ex: soda, sparkling beverages)  . Limit caffeinated beverages (ex: coffee, tea, energy drinks) . Avoid all sugar-sweetened beverages (ex: regular soda, sports drinks)  . Avoid alcohol  . Consume 3 meals per day or try to eat every 3-5 hours . Make a list of non-food related activities . Aim for 64-100 ounces of FLUID daily (with at least half of fluid intake being plain water)  . Aim for at least 60-80 grams of PROTEIN daily . Look for a liquid protein source that contains ?15 g protein and ?5 g carbohydrate (ex: shakes, drinks, shots) Physical activity is an  important part of a healthy lifestyle so keep it moving! The goal is to reach 150 minutes of exercise per week, including cardiovascular and weight baring activity.  *Goals that are bolded indicate the pt would like to start working towards these  Handouts Provided Include  . Pre-Op Goals . Bariatric Surgery Vitamins & Minerals  Learning Style & Readiness for Change Teaching method utilized: Visual &  Auditory  Demonstrated degree of understanding via: Teach Back  Barriers to learning/adherence to lifestyle change: Depression seems to influence food intake.   RD's Notes for Next SWL Visit:   Discuss healthy eating, demonstrating with MyPlate and meal/snack examples.   Assess progress made on goals previously set   Starting a multi due to colectomy   Next Steps Supervised Weight Loss (SWL) Visits Needed: 6  Patient is to call [facility] to enroll in Pre-Op Class (>2 weeks before surgery) and Post-Op Class (2 weeks after surgery) for further nutrition education when surgery date is scheduled.

## 2019-03-28 ENCOUNTER — Ambulatory Visit: Payer: BC Managed Care – PPO | Admitting: Dietician

## 2019-05-05 ENCOUNTER — Other Ambulatory Visit (HOSPITAL_COMMUNITY): Payer: Self-pay | Admitting: Psychiatry

## 2019-05-05 DIAGNOSIS — F429 Obsessive-compulsive disorder, unspecified: Secondary | ICD-10-CM

## 2019-05-05 DIAGNOSIS — F330 Major depressive disorder, recurrent, mild: Secondary | ICD-10-CM

## 2019-05-09 ENCOUNTER — Other Ambulatory Visit: Payer: Self-pay

## 2019-05-09 ENCOUNTER — Encounter (HOSPITAL_COMMUNITY): Payer: Self-pay | Admitting: Psychiatry

## 2019-05-09 ENCOUNTER — Ambulatory Visit (INDEPENDENT_AMBULATORY_CARE_PROVIDER_SITE_OTHER): Payer: BC Managed Care – PPO | Admitting: Psychiatry

## 2019-05-09 VITALS — Wt 236.0 lb

## 2019-05-09 DIAGNOSIS — F411 Generalized anxiety disorder: Secondary | ICD-10-CM | POA: Diagnosis not present

## 2019-05-09 DIAGNOSIS — Z79899 Other long term (current) drug therapy: Secondary | ICD-10-CM

## 2019-05-09 DIAGNOSIS — F429 Obsessive-compulsive disorder, unspecified: Secondary | ICD-10-CM | POA: Diagnosis not present

## 2019-05-09 DIAGNOSIS — F410 Panic disorder [episodic paroxysmal anxiety] without agoraphobia: Secondary | ICD-10-CM | POA: Diagnosis not present

## 2019-05-09 DIAGNOSIS — F330 Major depressive disorder, recurrent, mild: Secondary | ICD-10-CM | POA: Diagnosis not present

## 2019-05-09 MED ORDER — ALPRAZOLAM 0.5 MG PO TABS: 0.5000 mg | ORAL_TABLET | Freq: Every day | ORAL | 1 refills | Status: DC | PRN

## 2019-05-09 MED ORDER — FLUVOXAMINE MALEATE 25 MG PO TABS: ORAL_TABLET | ORAL | 2 refills | Status: DC

## 2019-05-09 MED ORDER — OLANZAPINE-FLUOXETINE HCL 6-50 MG PO CAPS: 1.0000 | ORAL_CAPSULE | Freq: Every day | ORAL | 2 refills | Status: DC

## 2019-05-09 NOTE — Progress Notes (Signed)
Virtual Visit via Telephone Note  I connected with Diana Page on 05/09/19 at  9:20 AM EST by telephone and verified that I am speaking with the correct person using two identifiers.   I discussed the limitations, risks, security and privacy concerns of performing an evaluation and management service by telephone and the availability of in person appointments. I also discussed with the patient that there may be a patient responsible charge related to this service. The patient expressed understanding and agreed to proceed.   History of Present Illness: Patient was evaluated through phone session.  She like to go back on Xanax because now she feels the Klonopin is making her groggy and does not work fast enough.  She is still have panic attacks and she takes 2-3 times a week Klonopin.  However she felt that it does not work as quickly.  Patient had a good response with Xanax in the past but she requested to switch to Klonopin because she was having headaches with the Xanax.  Now she is taking Ajovi for headaches prescribed by neurology and she is getting injection once a month and feel it is working very well for her headaches.  She like to try go back to Xanax.  Overall she described things are going well.  Her OCD is under control.  She is sleeping better.  She has decided to put off her bariatric surgery plan because she did not get enough support from her family.  She admitted some anxiety and sadness because of the Covid as she not able to see her or visit her family she denies any paranoia or any suicidal thoughts.  Her relationship with the husband is going very well.  Energy level is good.  Patient denies drinking or using any illegal substances.  She has no tremors, shakes or any rash.   Past Psychiatric History:Reviewed. H/O depression andOCD. TriedProzac when she was in [place]. No H/Ohallucination, paranoia, suicidal attempt or any psychiatric inpatient treatment. Hada good  response with Symbyax.TriedXanax but it make her tired and sleepy.      Psychiatric Specialty Exam: Physical Exam  ROS  There were no vitals taken for this visit.There is no height or weight on file to calculate BMI.  General Appearance: NA  Eye Contact:  NA  Speech:  Clear and Coherent and Normal Rate  Volume:  Normal  Mood:  Anxious  Affect:  NA  Thought Process:  Goal Directed  Orientation:  Full (Time, Place, and Person)  Thought Content:  WDL and Logical  Suicidal Thoughts:  No  Homicidal Thoughts:  No  Memory:  Immediate;   Good Recent;   Good Remote;   Good  Judgement:  Good  Insight:  Good  Psychomotor Activity:  NA  Concentration:  Concentration: Good and Attention Span: Good  Recall:  Good  Fund of Knowledge:  Good  Language:  Good  Akathisia:  No  Handed:  Right  AIMS (if indicated):     Assets:  Communication Skills Desire for Improvement Housing Resilience Social Support  ADL's:  Intact  Cognition:  WNL  Sleep:   ok      Assessment and Plan: Obsessive-compulsive disorder.  Major depressive disorder, recurrent.  Panic attacks.  I will discontinue Klonopin as patient like to go back on Xanax.  Recommended if Xanax started giving headaches then she need to call us back.  She takes benzodiazepine for panic attacks.  Discussed benzodiazepine dependence tolerance and withdrawal.  Continue Luvox 25 mg  in the morning and Symbyax 9/50 mg at bedtime.  She has no blood work in a while.  We will order CBC, CMP, hemoglobin A1c and TSH.  Patient will come on Monday for blood work.  I recommended to call us back if she has any question of any concern.  Follow-up in 3 months.  Follow Up Instructions:    I discussed the assessment and treatment plan with the patient. The patient was provided an opportunity to ask questions and all were answered. The patient agreed with the plan and demonstrated an understanding of the instructions.   The patient was advised to  call back or seek an in-person evaluation if the symptoms worsen or if the condition fails to improve as anticipated.  I provided 20 minutes of non-face-to-face time during this encounter.   Kathlee Nations, MD

## 2019-05-29 ENCOUNTER — Other Ambulatory Visit: Payer: Self-pay | Admitting: Gastroenterology

## 2019-05-29 DIAGNOSIS — R1033 Periumbilical pain: Secondary | ICD-10-CM

## 2019-06-07 ENCOUNTER — Ambulatory Visit
Admission: RE | Admit: 2019-06-07 | Discharge: 2019-06-07 | Disposition: A | Discharge status: Home / Self Care | Payer: BC Managed Care – PPO | Source: Ambulatory Visit | Attending: Gastroenterology | Admitting: Gastroenterology

## 2019-06-07 DIAGNOSIS — R1033 Periumbilical pain: Secondary | ICD-10-CM

## 2019-07-07 ENCOUNTER — Other Ambulatory Visit (HOSPITAL_COMMUNITY): Payer: Self-pay | Admitting: Psychiatry

## 2019-07-07 DIAGNOSIS — F411 Generalized anxiety disorder: Secondary | ICD-10-CM

## 2019-07-07 DIAGNOSIS — F330 Major depressive disorder, recurrent, mild: Secondary | ICD-10-CM

## 2019-07-07 DIAGNOSIS — F429 Obsessive-compulsive disorder, unspecified: Secondary | ICD-10-CM

## 2019-07-24 IMAGING — RF DG UGI W/O KUB
10 of 12 series · 14 of 21 positions shown · non-contrast
Comparison: CT abdomen pelvis 05/12/2018

CLINICAL DATA: Morbid obesity.

EXAM:
UPPER GI SERIES WITH KUB
TECHNIQUE: After obtaining a scout radiograph a routine upper GI series was
performed using thin barium
FLUOROSCOPY TIME:  Fluoroscopy Time:  1 minutes 18 seconds
Radiation Exposure Index (if provided by the fluoroscopic device):
Number of Acquired Spot Images: 0

[Series 1: t abdomen supine · 0.15mm/px · 1 of 1 slices shown]
[im 1/1]
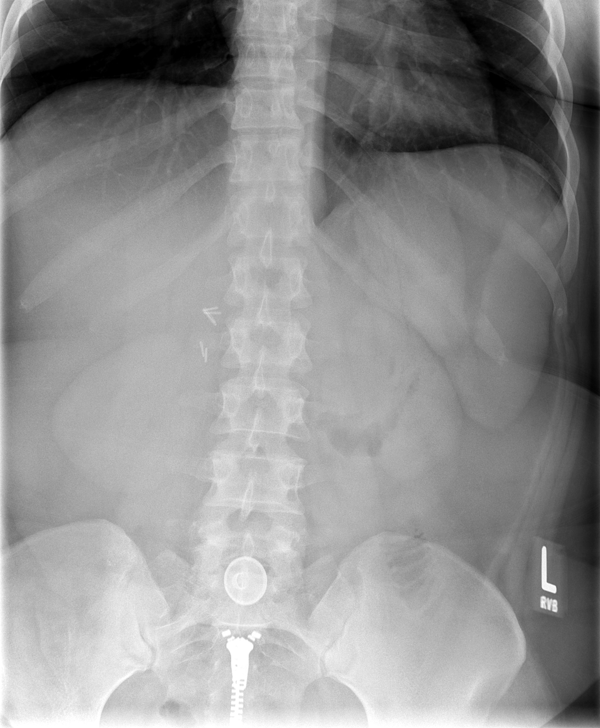

[Series 2: cp_standard · 0.36mm/px · 2 of 170 frames shown (1 of 4)]
[frame 26/170]
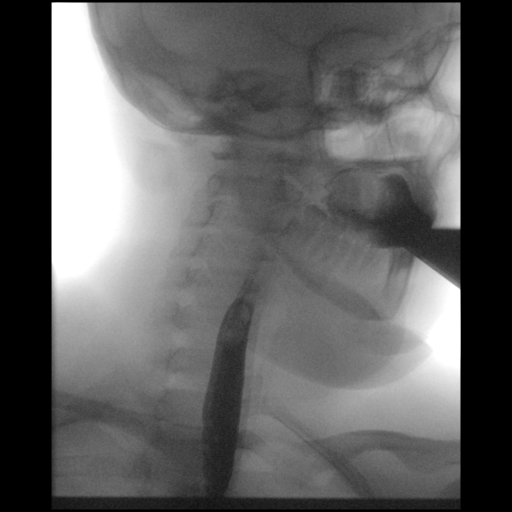
[frame 86/170]
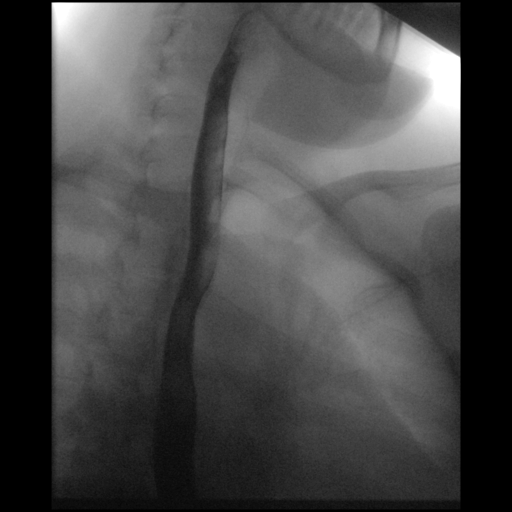

[Series 3: cp_standard · 0.36mm/px · 3 of 73 frames shown (2 of 4)]
[frame 11/73]
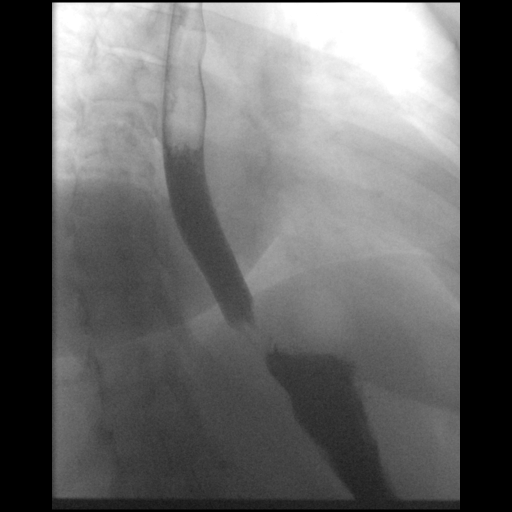
[frame 37/73]
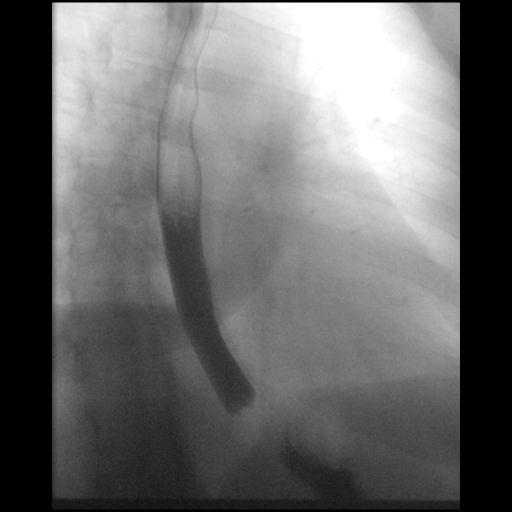
[frame 66/73]
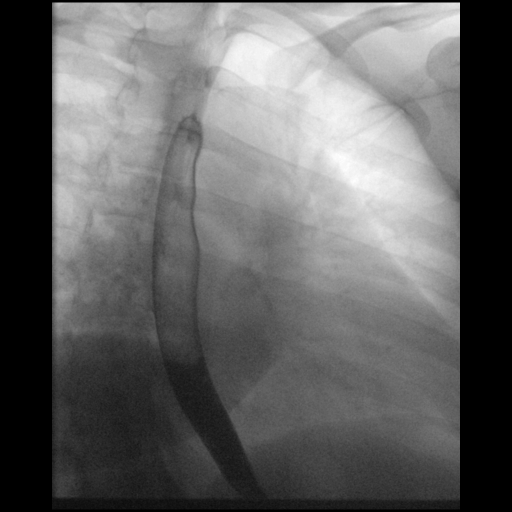

[Series 4: fluoro_barium 2fps_bw · 0.18mm/px · 1 of 1 slices shown (1 of 5)]
[im 1/1]
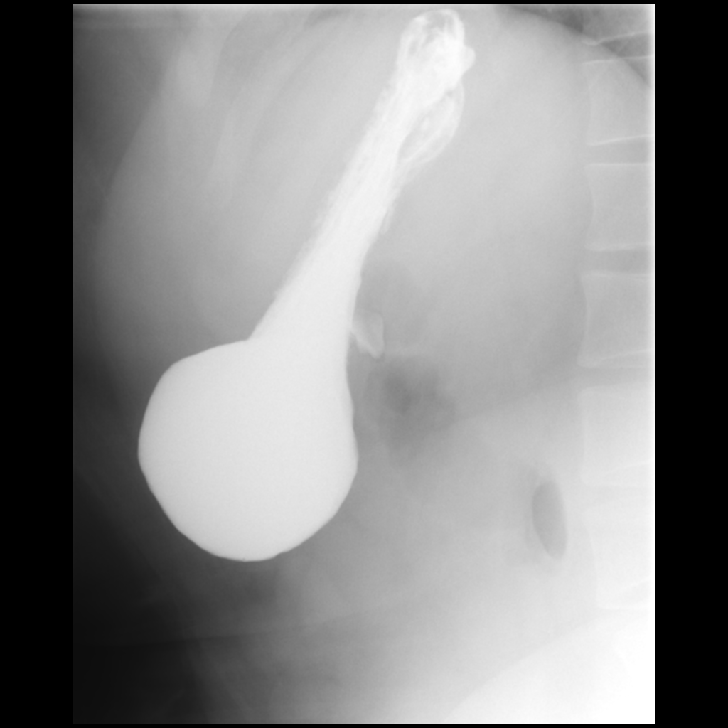

[Series 6: fluoro_barium 2fps_bw · 0.18mm/px · 1 of 1 slices shown (2 of 5)]
[im 1/1]
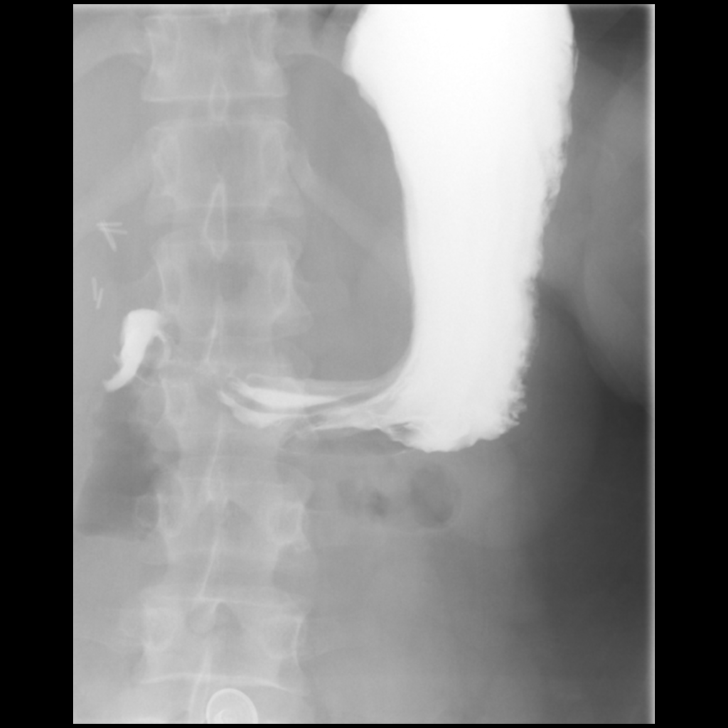

[Series 7: fluoro_barium 2fps_bw · 0.18mm/px · 1 of 1 slices shown (3 of 5)]
[im 1/1]
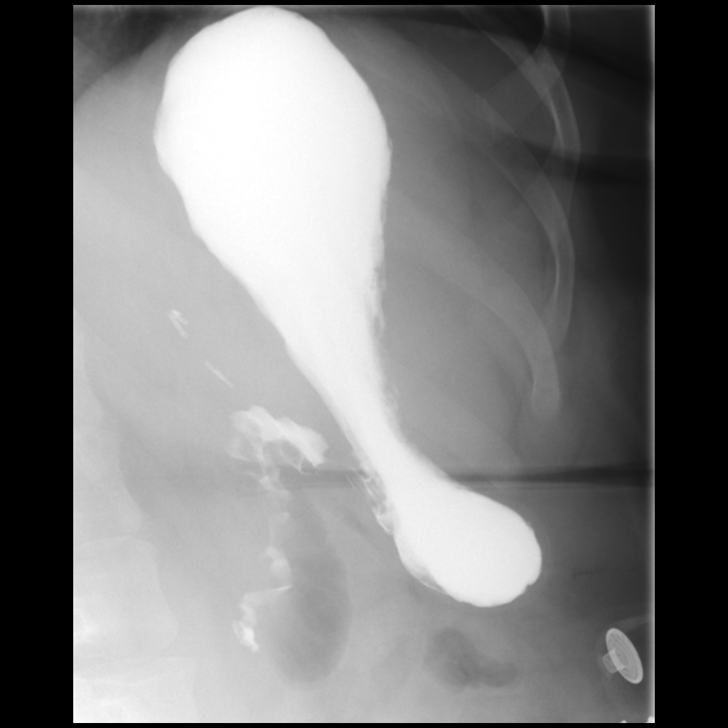

[Series 8: cp_standard · 0.37mm/px · 2 of 127 frames shown (3 of 4)]
[frame 64/127]
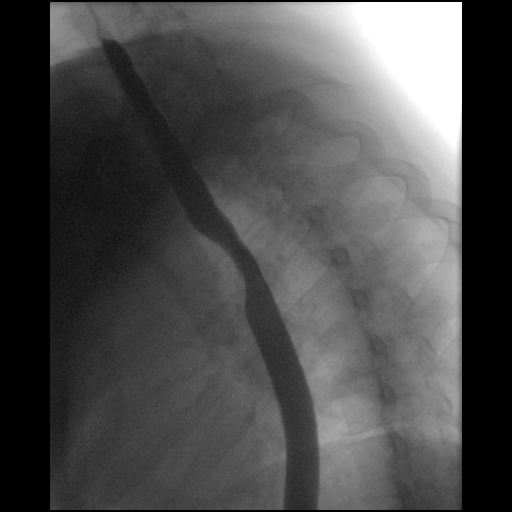
[frame 107/127]
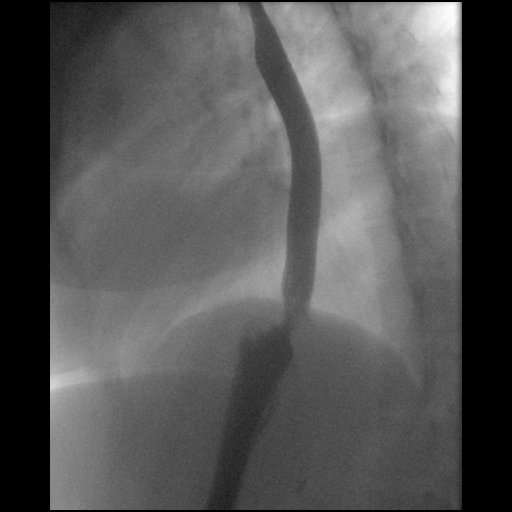

[Series 9: fluoro_barium 2fps_bw · 0.18mm/px · 1 of 1 slices shown (4 of 5)]
[im 1/1]
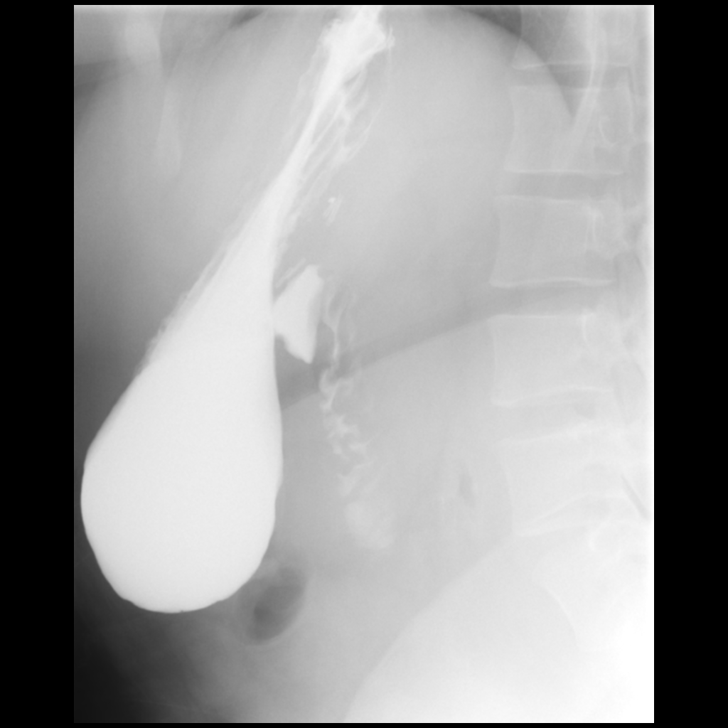

[Series 10: fluoro_barium 2fps_bw · 0.19mm/px · 1 of 1 slices shown (5 of 5)]
[im 1/1]
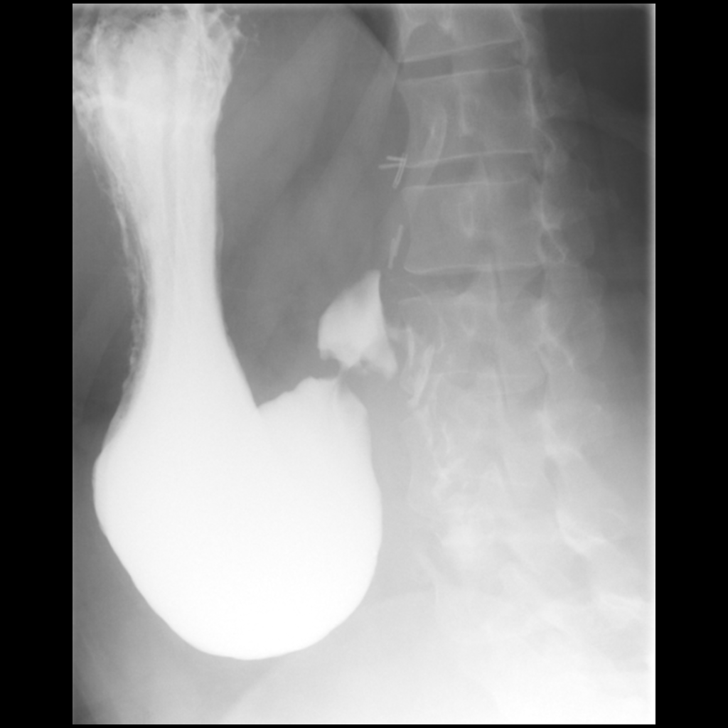

[Series 12: cp_standard · 0.19mm/px · 1 of 1 slices shown (4 of 4)]
[im 1/1]
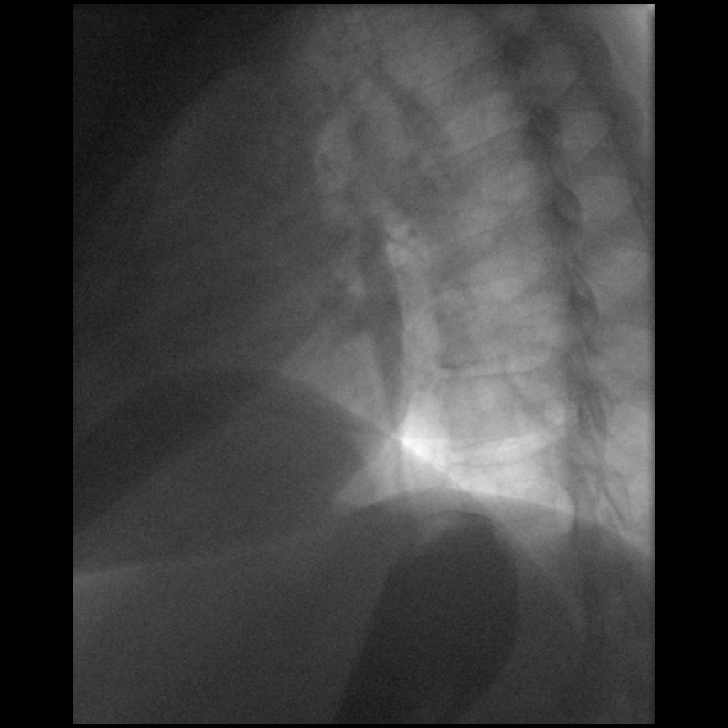

[14 of 21 positions shown; findings below may reference images not displayed]

FINDINGS: Esophageal mucosa and motility normal. No stricture or mass. No
significant hiatal hernia. Mild gastroesophageal reflux with the
patient supine drinking water

Stomach is normal without ulcer or mass.  Normal duodenal bulb.
IMPRESSION: Mild gastroesophageal reflux otherwise negative upper GI.

## 2019-07-24 IMAGING — DX DG CHEST 2V
2 series · 2 of 2 positions shown · non-contrast
Comparison: April 30, 2015

CLINICAL DATA: Pain evaluation for bariatric surgery. Ulcerative
colitis.

EXAM:
CHEST - 2 VIEW

[chest pa]
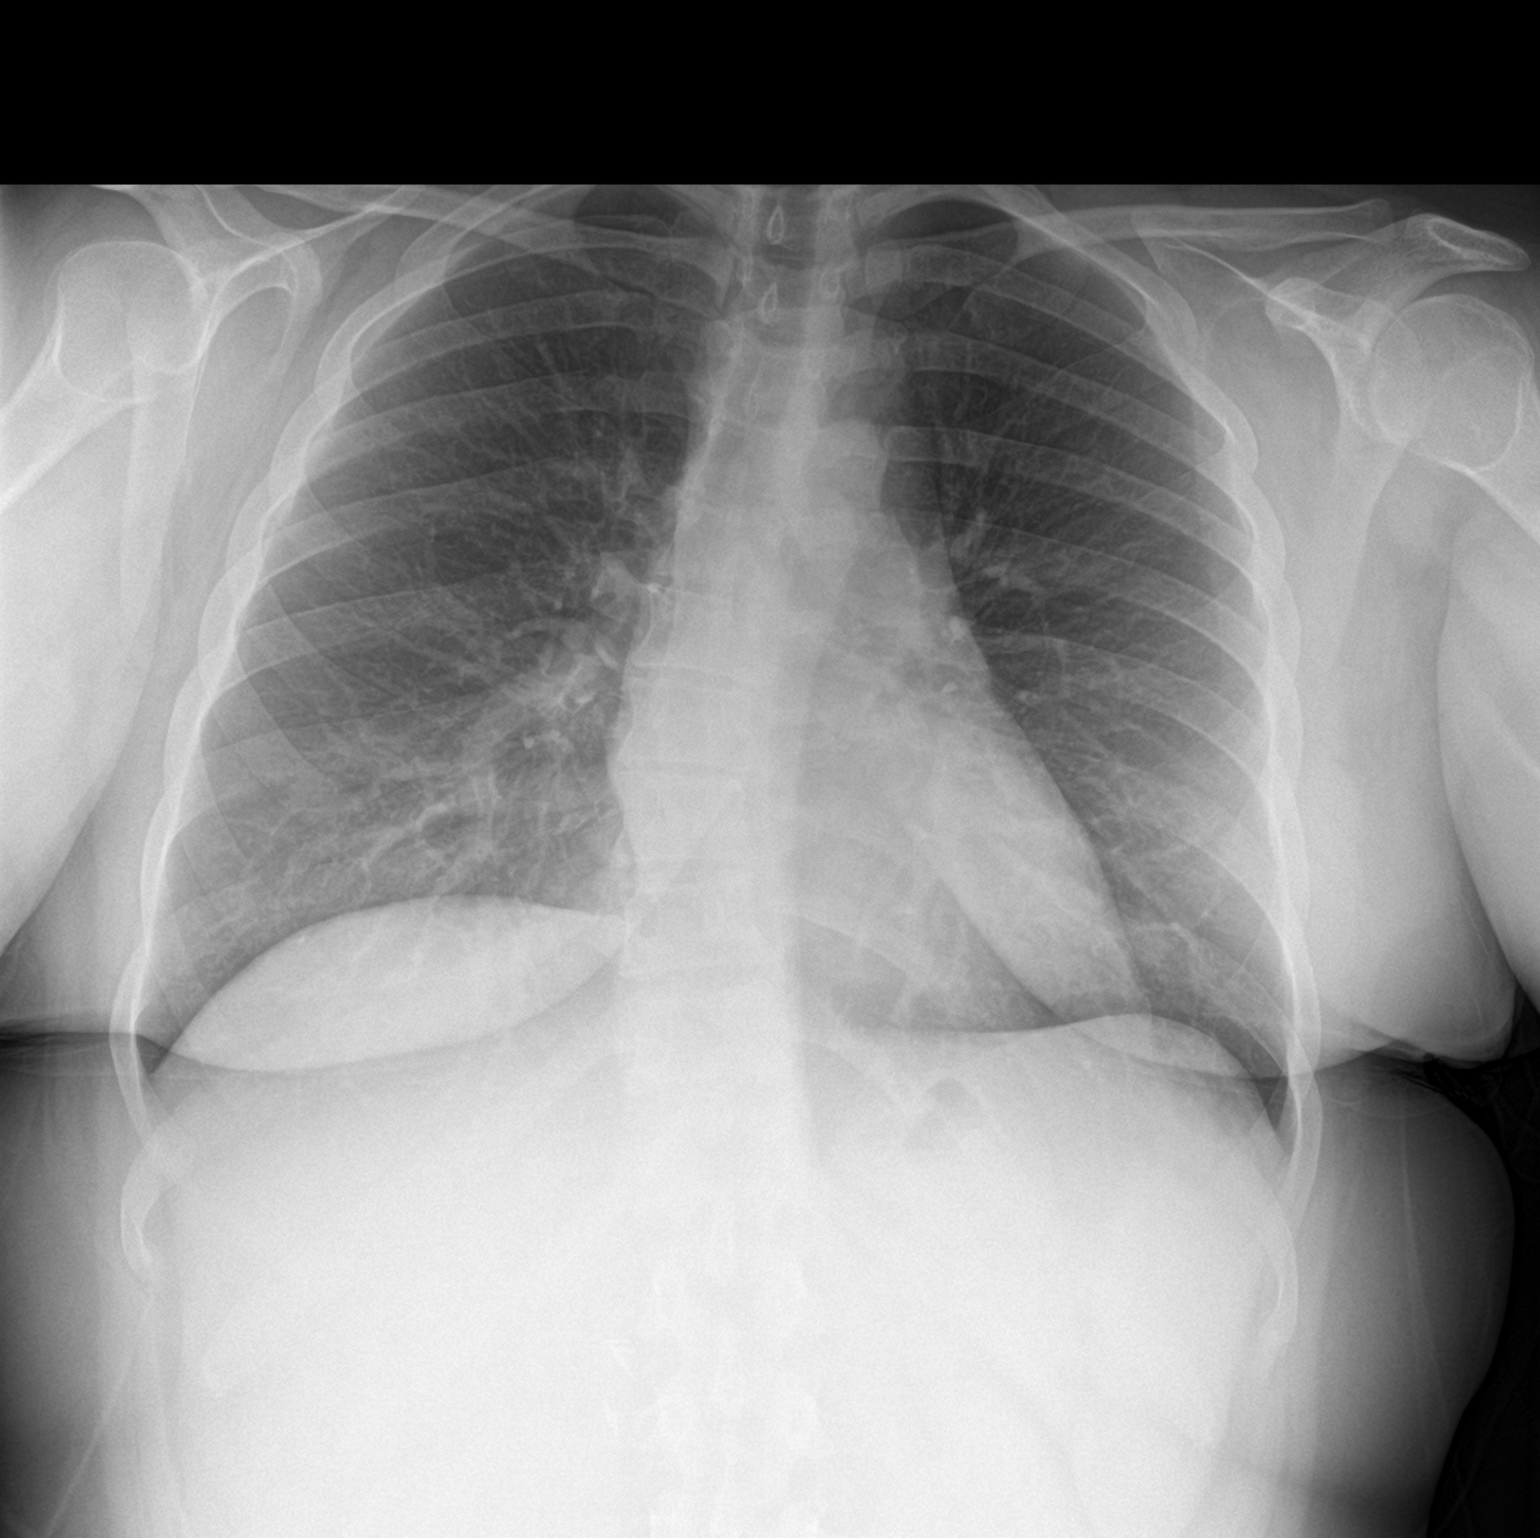

[chest lat]
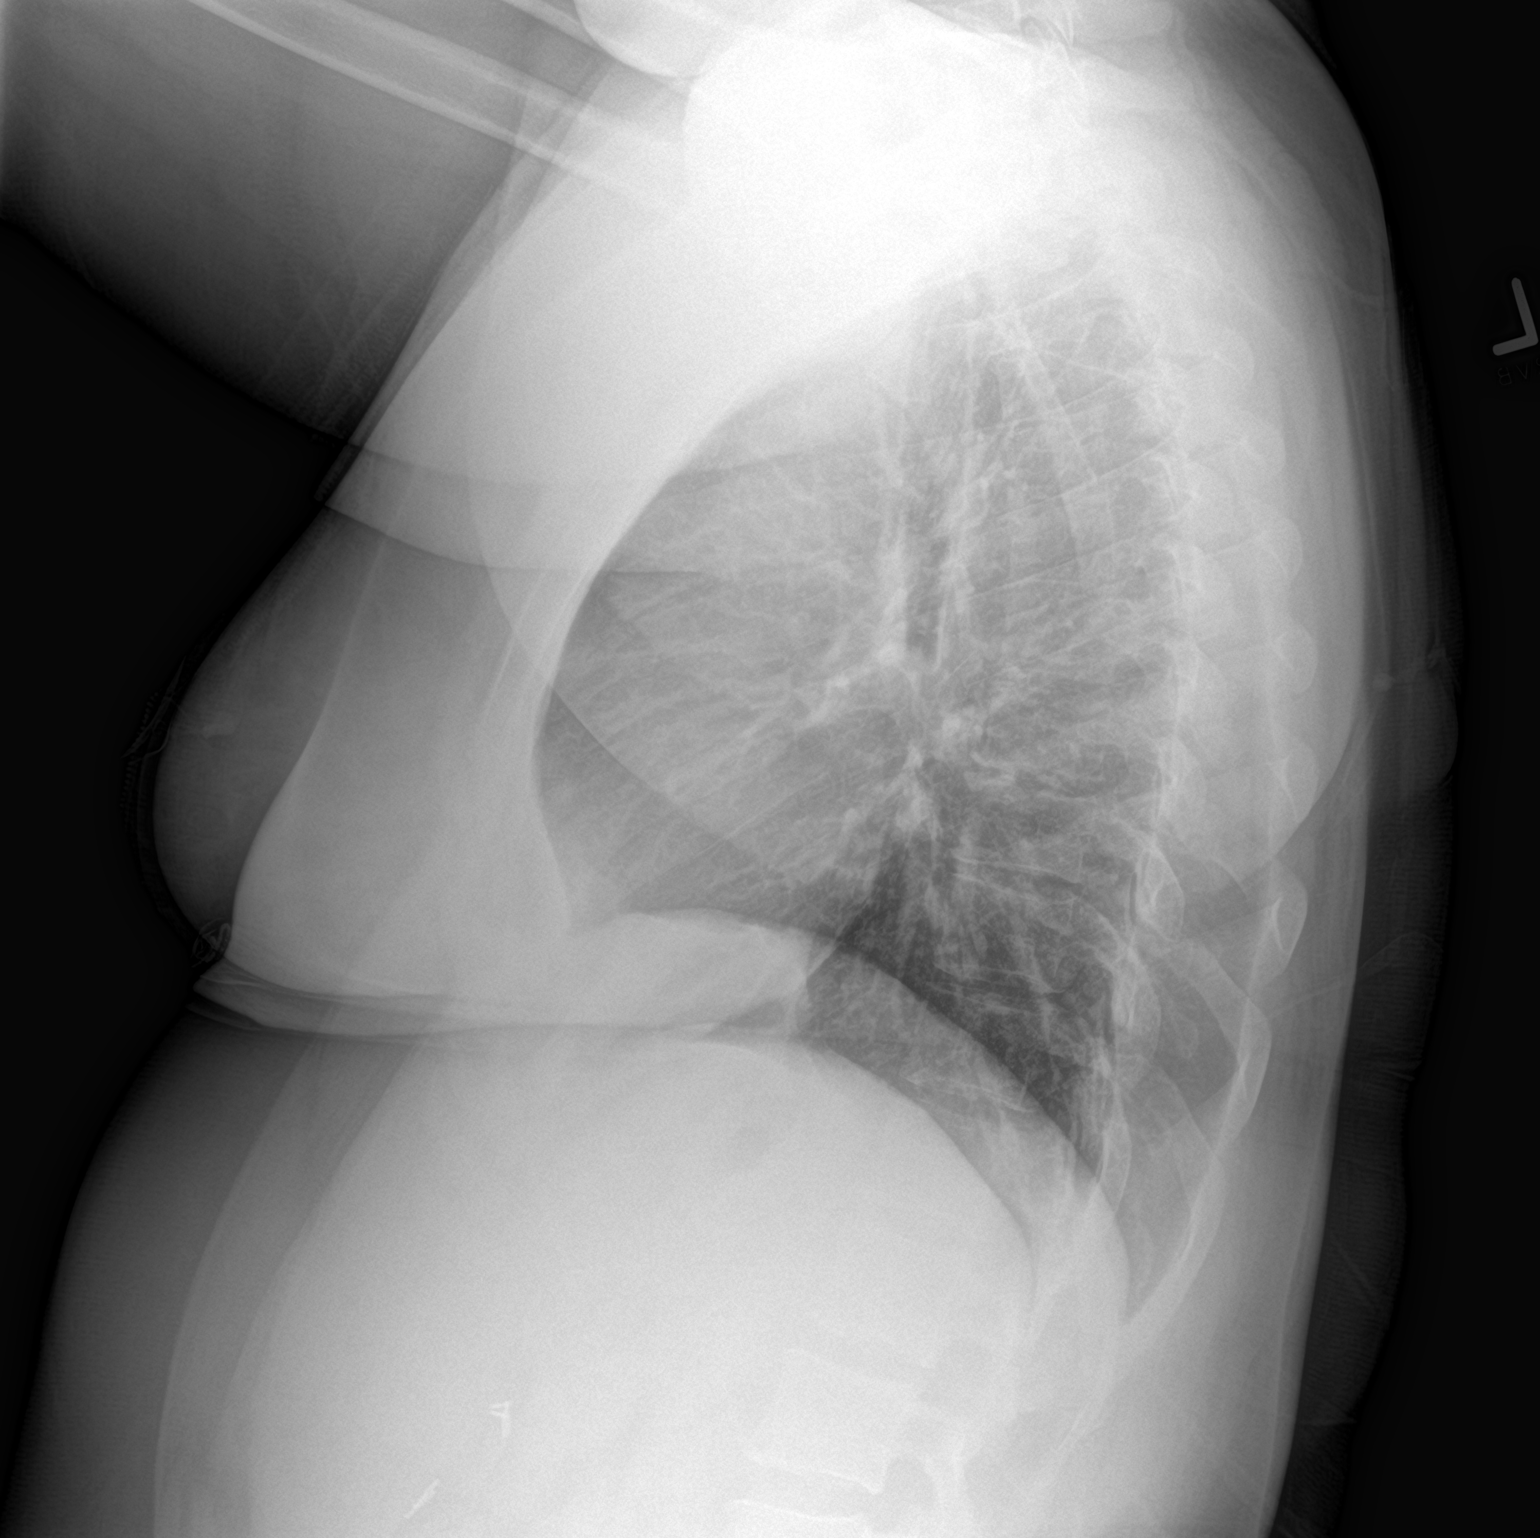

[2 of 2 positions shown; findings below may reference images not displayed]

FINDINGS: There is no edema or consolidation. Heart size and pulmonary
vascularity are normal. No adenopathy. There is lower thoracic
dextroscoliosis.
IMPRESSION: No edema or consolidation.

## 2019-08-04 ENCOUNTER — Other Ambulatory Visit (HOSPITAL_COMMUNITY): Payer: Self-pay | Admitting: Psychiatry

## 2019-08-04 DIAGNOSIS — F330 Major depressive disorder, recurrent, mild: Secondary | ICD-10-CM

## 2019-08-04 DIAGNOSIS — F429 Obsessive-compulsive disorder, unspecified: Secondary | ICD-10-CM

## 2019-08-07 ENCOUNTER — Ambulatory Visit (INDEPENDENT_AMBULATORY_CARE_PROVIDER_SITE_OTHER): Payer: BC Managed Care – PPO | Admitting: Psychiatry

## 2019-08-07 ENCOUNTER — Encounter (HOSPITAL_COMMUNITY): Payer: Self-pay | Admitting: Psychiatry

## 2019-08-07 ENCOUNTER — Other Ambulatory Visit: Payer: Self-pay

## 2019-08-07 DIAGNOSIS — F330 Major depressive disorder, recurrent, mild: Secondary | ICD-10-CM | POA: Diagnosis not present

## 2019-08-07 DIAGNOSIS — F429 Obsessive-compulsive disorder, unspecified: Secondary | ICD-10-CM

## 2019-08-07 DIAGNOSIS — F410 Panic disorder [episodic paroxysmal anxiety] without agoraphobia: Secondary | ICD-10-CM | POA: Diagnosis not present

## 2019-08-07 MED ORDER — BUPROPION HCL ER (XL) 150 MG PO TB24: 150.0000 mg | ORAL_TABLET | Freq: Every day | ORAL | 1 refills | Status: DC

## 2019-08-07 MED ORDER — OLANZAPINE-FLUOXETINE HCL 6-50 MG PO CAPS: 1.0000 | ORAL_CAPSULE | Freq: Every day | ORAL | 2 refills | Status: DC

## 2019-08-07 NOTE — Progress Notes (Signed)
Virtual Visit via Telephone Note  I connected with Diana Page on 08/07/19 at 10:20 AM EST by telephone and verified that I am speaking with the correct person using two identifiers.   I discussed the limitations, risks, security and privacy concerns of performing an evaluation and management service by telephone and the availability of in person appointments. I also discussed with the patient that there may be a patient responsible charge related to this service. The patient expressed understanding and agreed to proceed.   History of Present Illness: Patient was evaluated through phone session.  On the last visit we switch from Klonopin to Xanax because she was having dizziness and does not work as quick.  Since the last visit she has taken only try Xanax as her anxiety is not as bad.  However she is concerned about sexual side effects.  Since she got married she feels that this should be addressed.  So far her OCD and anxiety is stable on Symbyax, Luvox and sometimes taking Xanax.  Her relationship with her husband is going very well.  She had decided not to go for bariatric surgery as her endocrinologist recommended possible surgery and her acid reflux may get worse.  She is sleeping good.  She denies any irritability, anger, crying spells or any feeling of hopelessness or worthlessness.  She believes Luvox may have causing sexual side effects and she like to try a different medication.  She has no rash, itching, tremors or shakes.   Past Psychiatric History:Reviewed.  H/O depression andOCD. TriedProzac when she was in [place]. No H/Ohallucination, paranoia, suicidal attempt or any psychiatric inpatient treatment. Hada good response with Symbyax.TriedXanax but it make her tired and sleepy.   Psychiatric Specialty Exam: Physical Exam  Review of Systems  There were no vitals taken for this visit.There is no height or weight on file to calculate BMI.  General Appearance: NA  Eye  Contact:  NA  Speech:  Clear and Coherent and Normal Rate  Volume:  Normal  Mood:  Euthymic  Affect:  NA  Thought Process:  Goal Directed  Orientation:  Full (Time, Place, and Person)  Thought Content:  Logical  Suicidal Thoughts:  No  Homicidal Thoughts:  No  Memory:  Immediate;   Good Recent;   Good Remote;   Good  Judgement:  Good  Insight:  Present  Psychomotor Activity:  NA  Concentration:  Concentration: Good and Attention Span: Good  Recall:  Good  Fund of Knowledge:  Good  Language:  Good  Akathisia:  No  Handed:  Right  AIMS (if indicated):     Assets:  Communication Skills Desire for Improvement Housing Resilience Social Support Talents/Skills  ADL's:  Intact  Cognition:  WNL  Sleep:         Assessment and Plan: Obsessive-compulsive disorder.  Major depressive disorder, recurrent.  Panic attack.  Patient is a stable on her medication and she has taken twice Xanax since the last visit.  She is concerned about sexual side effects and believes Luvox may be causing it.  I recommend not to take Luvox and try Wellbutrin XL 150 mg in the morning.  We discussed side effect specially Wellbutrin can cause headaches, tremors, insomnia.  She is willing to try.  I will continue Symbyax 9/50 at bedtime.  She will discontinue Luvox.  She does not need a new prescription of Xanax.  She did not had a blood work but agreed to have a blood work on her next  appointment.  Follow-up in 4 weeks.  I recommend to call us back if there is any question of any concern.    Follow Up Instructions:    I discussed the assessment and treatment plan with the patient. The patient was provided an opportunity to ask questions and all were answered. The patient agreed with the plan and demonstrated an understanding of the instructions.   The patient was advised to call back or seek an in-person evaluation if the symptoms worsen or if the condition fails to improve as anticipated.  I provided 20  minutes of non-face-to-face time during this encounter.   Kathlee Nations, MD

## 2019-08-14 ENCOUNTER — Telehealth: Payer: Self-pay | Admitting: Neurology

## 2019-08-14 NOTE — Telephone Encounter (Signed)
I called patient and LVM regarding rescheduling appointment on 2/24 due to MD out of office for an appointment. Requested patient call back to reschedule. FYI

## 2019-08-19 NOTE — Telephone Encounter (Signed)
Called patient again and LVM to reschedule 2/24 appointment due to MD out. Requested patient call back to reschedule. Appointment has been cancelled.

## 2019-08-21 ENCOUNTER — Ambulatory Visit: Payer: Self-pay | Admitting: Neurology

## 2019-09-04 ENCOUNTER — Other Ambulatory Visit: Payer: Self-pay

## 2019-09-04 ENCOUNTER — Ambulatory Visit (INDEPENDENT_AMBULATORY_CARE_PROVIDER_SITE_OTHER): Payer: BC Managed Care – PPO | Admitting: Psychiatry

## 2019-09-04 ENCOUNTER — Encounter (HOSPITAL_COMMUNITY): Payer: Self-pay | Admitting: Psychiatry

## 2019-09-04 DIAGNOSIS — F410 Panic disorder [episodic paroxysmal anxiety] without agoraphobia: Secondary | ICD-10-CM | POA: Diagnosis not present

## 2019-09-04 DIAGNOSIS — F330 Major depressive disorder, recurrent, mild: Secondary | ICD-10-CM | POA: Diagnosis not present

## 2019-09-04 DIAGNOSIS — F429 Obsessive-compulsive disorder, unspecified: Secondary | ICD-10-CM | POA: Diagnosis not present

## 2019-09-04 MED ORDER — OLANZAPINE-FLUOXETINE HCL 6-50 MG PO CAPS: 1.0000 | ORAL_CAPSULE | Freq: Every day | ORAL | 2 refills | Status: DC

## 2019-09-04 MED ORDER — BUPROPION HCL ER (XL) 150 MG PO TB24: 150.0000 mg | ORAL_TABLET | Freq: Every day | ORAL | 2 refills | Status: DC

## 2019-09-04 MED ORDER — BUPROPION HCL ER (XL) 150 MG PO TB24: 150.0000 mg | ORAL_TABLET | Freq: Every day | ORAL | 1 refills | Status: DC

## 2019-09-04 NOTE — Progress Notes (Signed)
Virtual Visit via Telephone Note  I connected with Diana Page on 09/04/19 at 11:20 AM EST by telephone and verified that I am speaking with the correct person using two identifiers.   I discussed the limitations, risks, security and privacy concerns of performing an evaluation and management service by telephone and the availability of in person appointments. I also discussed with the patient that there may be a patient responsible charge related to this service. The patient expressed understanding and agreed to proceed.   History of Present Illness: Patient was evaluated through phone session.  We have switch her Luvox to Wellbutrin and she is seeing much improvement with the Wellbutrin.  She has less sexual side effects and she has more energy.  She admitted her appetite is low but overall she feels things are going well.  She did not notice any change in her weight.  She reported her OCD, anxiety is stable.  She has not taken Xanax since the last visit and denies any major panic attack.  Her relationship with her husband is now going very well.  She does not want to change medication since they are working.  She denies any crying spells or any feeling of hopelessness or worthlessness.  Past Psychiatric History:Reviewed.            H/O depression andOCD. TriedProzac in [place]. No h/o hallucination, paranoia, suicidal attempt or inpatient treatment. Hada good response with Symbyax.TriedXanax but made tired and dizziness. Luvox caused sexual side effects.     Psychiatric Specialty Exam: Physical Exam  Review of Systems  There were no vitals taken for this visit.There is no height or weight on file to calculate BMI.  General Appearance: NA  Eye Contact:  NA  Speech:  Clear and Coherent and Normal Rate  Volume:  Normal  Mood:  Euthymic  Affect:  NA  Thought Process:  Coherent  Orientation:  Full (Time, Place, and Person)  Thought Content:  WDL and Logical  Suicidal  Thoughts:  No  Homicidal Thoughts:  No  Memory:  Immediate;   Good Recent;   Good Remote;   Good  Judgement:  Good  Insight:  Present  Psychomotor Activity:  NA  Concentration:  Concentration: Good and Attention Span: Good  Recall:  Good  Fund of Knowledge:  Good  Language:  Good  Akathisia:  No  Handed:  Right  AIMS (if indicated):     Assets:  Communication Skills Desire for Improvement Housing Resilience Social Support  ADL's:  Intact  Cognition:  WNL  Sleep:   good      Assessment and Plan: Obsessive-compulsive disorder.  Major depressive disorder, recurrent.  Panic attack.  Patient doing well since we added Wellbutrin.  She is no longer taking Luvox.  I will continue Symbyax 9/50 at bedtime and continue Wellbutrin XL 150 mg in the morning.  She does not need a new prescription of Xanax.  Discussed medication side effects and benefits.  Recommended to call us back if she has any question of any concern.  Follow-up in 3 months.  Follow Up Instructions:    I discussed the assessment and treatment plan with the patient. The patient was provided an opportunity to ask questions and all were answered. The patient agreed with the plan and demonstrated an understanding of the instructions.   The patient was advised to call back or seek an in-person evaluation if the symptoms worsen or if the condition fails to improve as anticipated.  I provided  15 minutes of non-face-to-face time during this encounter.   Kathlee Nations, MD

## 2019-10-23 NOTE — Progress Notes (Signed)
[name]    Provider:  [name] Referring Provider: No ref. provider found Primary Care Physician:  Patient, No Pcp Per   CC: Headache and vision loss, dizziness  Ajovy works really well. She starts having migraines when it is time to take another shot. 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 Works all month. Last 5 days she has breakthrough that can be significant, not always but can be. For month of the month no headaches at all. She doesn't want to change anything however, tylenol doesn't help acutely.  We discussed and she is doing so well and having migraines infrequently when she does have them to make sure that she treats them right away, will prescribe her Maxalt again she has never had a problem with triptans, will also give her Nurtec and advised her she can take multiple medications at once if needed.  I also reviewed Dr. Velvet Bathe notes, he recently saw her October 01, 2019, visual field full again today, OCT RNFL shows definite improvement stable with no edema, tolerating the Diamox and symptoms are controlled, she does want to change anything until she loses a significant amount of weight.  Interval history 31/24/2020:  worsening headaches. She is now having 2 migraines days a week or more, more hedaches. Pounding, throbbing, unilateral, nausea, light/sound sensitivity, worse with movement, sitting still in a dark room, bending over makes it pound worse.  This is been ongoing for the last 6 months.  She is worried because she is also having positional headaches, and exertional headaches when she sneezes, she has a history of idiopathic intracranial hypertension and this makes her worry for for other etiologies.  Waking up with headaches now.  She has a history of abnormal enhancement in the brain last was 2017 we will repeat an MRI.  I discussed with her that this may be progression of migraines, she is very  compliant with her Diamox and she has not gained weight.  In fact she is going to have bariatric surgery.  Given symptoms we will order MRI of the brain.  We will also start her on CGRP.  Interval history 11/07/2018: She was pursuing bariatric surgery and couldn;t get it due to a virus. She is going to contact it again. Motrin, tylenol not helpng when she does get a headache. She does get occ migraines, needs to take something 3x a month. We discussed choices for acute management in detail. For her migraines will try a triptan. It is pounding, throbbing, unilateral with light and sound sensitivity. Do not recommend long-term opioids for pain management or acute migraines.  Interval history 10/24/2017: She is doing well. Still on the Diamox. Doing well. She will be going to Bariatric surgery referral which I recommend as weight loss is critical due to her condition and risk of permanent vision loss. IIH is associated with obesity and bariatric surgery is indicated in this case. No headaches, no vision changes, feel stable on medication. She is having dizziness at night which can be a symptom, needs bariatric surgery.   Interval history 01/23/2017: She has had 2 headaches in the last 6 months but it was in the setting of dehydration.  Her weight is fluctuating. She was 212 recently which is her highest. She saw [name] for her weight and she was placed on metformin and had side effects. She is watching her diet, prefers to try differently. She is going back to school and quitting work, getting  married in [place]. No headaches, vision is fine no vision changes, she sees [name] every 4 months, doing well on the diamox, visual fields are stable,  No more edema of the eyes. Discussed Bariatric surgery.    Interval history 07/25/2016: 29 year old here for follow up on IIH on acetazolamide and obesity. She is getting married in November. No dizziness. Rare headaches while on the medication however she is on  157m diamox a day and having a difficult time losing weight which is critical in IIH. Her headaches are better,  1-2 since being last seen. No vision changes, no dizziness, no hearing changes. She is following weight watchers now and she saw a dietician but still having difficulty with weight loss.  She stopped eating meat and is worried about her B12 and iron. No snoring, no morning headache. She takes 15064macetazolamide. She has missed 3 periods but 2 pregnancy tests are negative. They use condoms.  She is on 150051miamox daily and I can decrease this dose if she can lose 10% of her body weight, we may be able to get her off of medication with even more weight loss. Will refer to the [facility] for weight loss. She is having a difficult time with weight loss and I recommend the [facility] [place]ed the role of weight in IIH and vision loss.   Interval history 03/21/2016: 29 17ar old female with papilledema, dizziness, positional headache, vision and hearing changes. Opening pressure was 34.5, improved on diamox. Abnormal MRI with abnormal signal within the medial temporal lobes bilaterally predominantly involving the hippocampal structures. She was working hard, she was not drinking water and she was lifting a lot of things and she was running all day and she felt lightheaded and dizzy and fatigued. She went and sat down for 5 minutes and she was ok. She was not drinking any water. She has one headache that lasted a whole day and a whole night it was terrible. She tried giving up caffeine. Likely caffeine withdrawal. Restared caffeine and fine. Vision has been fine. No snoring, no morning headaches. She takes 1500m67metazolamide.   Interval history 09/16/2015: She is having worsening blurry vision. This started within the last month. Things are not as crisp. No pressure. She is also hearing low level, wooshing in the ears. Worse when she is laying down  and then stands. No headaches, no pressure. No side effects from the diamox. She has not been able to lose weight. Will keep her on the current dose and then if weight loss of 10% will decreasing the diamox. Need to repeat MRi of the brain due to new worsneing vision and hearing issues given previously seen Abnormal signal within the medial temporal lobes bilaterally predominantly involving the hippocampal structures.  MRI 09/26/2015: FINDINGS:  The brain parenchyma shows no significant abnormalities except subtle prominence and thickening of the right hippocampus and medial temporal lobe which is less prominent compared to the previous scan and is of unclear significance. No other structural lesion, tumor infarcts are noted. Diffusion weighted imaging is negative for acute ischemia. Susceptibility weighted imaging is negative for microhemorrhages. Subarachnoid spaces and ventricular system appear normal. Extraction brain structures up and normal. Calvarium shows no abnormalities. The pituitary gland and cerebellar tonsils appear normal. Flow voids of the large vessels are intact and circulation appear to be patent. Orbits and paranasal sinuses appear normal. Visualized portions of the cervical spine appears normal. Contrast images do  not result in any abnormal areas of enhancement.     IMPRESSION: Slightly abnormal MRI scan of the brain showing subtle prominence of the right medial temporal lobe which is of unclear significance. Overall no significant change compared with previous MRI dated 01/16/2015.  Interval update: Opening pressure of LP was 34.5. 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 the thumping and pressure in her head have improved. The peripheral vision changes are gone, no  headaches. She needs follow up with ophthalmology. She needs an ophthalmologist to follow in the area, will refer to [name]. She denies any current side effects from the diamox.   HPI: Jennings Stirling is a 29 y.o. female here as a referral from Dr. Jeanie Cooks for dizziness.  PMHx obesity. She was seen in the ED for acute onset headache and dizziness with decreased peripheral vision. She had an abnormal MRI and was called back to the ED for lumbar puncture which showed negative/normal CSF analysis. Unfortunately the LP did not include an opening pressure. She felt much better after the LP however. Currently if she stands up, she gets a thumping in the head. She gets stars in her right >Left peripheral vision. She gets pressure in the head all over. If she lays on her stomach, the pressure is worse and she can feel her heartbeat in her head. She went to optometry who diagnosed edema of the optic disks. Right eye has blurry vision which is persistent. She has had severe headaches. The dizziness has resolved after the LP. Symptoms felt much better after LP. Denies seizure-like events, staring, altered awareness. Symptoms are daily. No other associated focal neurologic deficits. No neck pain.   Reviewed notes, labs and imaging from outside physicians, which showed: MRi brain 12/27/2014: Personally reviewed images: 1. Abnormal signal within the medial temporal lobes bilaterally predominantly involving the hippocampal structures. This is concerning for infectious process including herpes encephalitis. Other viral encephalitides can give a similar picture. Limbic encephalitis can look like this, but is typically associated with lung cancer. Alternatively, this could be related to seizure activity. The patient has no history of seizures. 2. Question abnormal signal within the anterior aspect of the right optic nerve, potentially related to same process.   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 Widening of the optic nerve sheath. This can be observed with intracranial hypertension, and is commonly seen with pseudotumor cerebri. 2. The subtle signal changes in the mesial temporal lobes appear unchanged. Although initially worrisome for an infectious or limbic encephalopathy, the stability and the absence of specific clinical symptoms make this less likely. If clinically indicated, consider follow-up evaluation or follow-up CSF analysis  Labs:   CSF: protein, glucose, diff, cell count normal TSH, B12, B1, HIV wnl CRP, ESR elevated CMP unremarkable CBC w/ anemia   Patient complains of symptoms per HPI as well as the following symptoms:migraines, weight gain/obesity. Pertinent negatives and positives per HPI. All others negative   Social History   Socioeconomic History  . Marital status: Married    Spouse  name: Not on file  . Number of children: 0  . Years of education: Ba  . Highest education level: Not on file  Occupational History  . Occupation: Supervisor     Comment: Chief Operating Officer  Tobacco Use  . Smoking status: Former Smoker    Types: Cigarettes, E-cigarettes    Quit date: 06/27/2009    Years since quitting: 10.3  . Smokeless tobacco: Never Used  Substance and Sexual Activity  . Alcohol use: Yes    Alcohol/week: 0.0 standard drinks    Comment: occasionally, "maybe once or twice a year"  . Drug use: No  . Sexual activity: Not Currently    Partners: Male    Birth control/protection: Condom  Other Topics Concern  . Not on file  Social History Narrative   Lives at home with husband, Lennette Bihari.   Caffeine use: maybe 5 big cups daily   Right  handed   Social Determinants of Health   Financial Resource Strain:   . Difficulty of Paying Living Expenses:   Food Insecurity:   . Worried About Charity fundraiser in the Last Year:   . Arboriculturist in the Last Year:   Transportation Needs:   . Film/video editor (Medical):   Marland Kitchen Lack of Transportation (Non-Medical):   Physical Activity:   . Days of Exercise per Week:   . Minutes of Exercise per Session:   Stress:   . Feeling of Stress :   Social Connections:   . Frequency of Communication with Friends and Family:   . Frequency of Social Gatherings with Friends and Family:   . Attends Religious Services:   . Active Member of Clubs or Organizations:   . Attends Archivist Meetings:   Marland Kitchen Marital Status:   Intimate Partner Violence:   . Fear of Current or Ex-Partner:   . Emotionally Abused:   Marland Kitchen Physically Abused:   . Sexually Abused:     Family History  Problem Relation Age of Onset  . OCD Mother   . Diabetes Mother   . Depression Mother   . Anxiety disorder Mother   . Obesity Mother   . Obesity Father   . OCD Sister   . Depression Sister   . OCD Brother   . Anxiety disorder Brother     Past Medical History:  Diagnosis Date  . Anemia   . Anxiety   . Back pain   . Depression   . Fatty liver   . Gallbladder problem   . GERD (gastroesophageal reflux disease)   . Hypertension    Intercranial Hypertension  . IBS (irritable bowel syndrome)   . Joint pain   . Obsessive compulsive disorder 06/14  . Ostomy nurse consultation   . Pancreatitis   . Pancreatitis    hx of   . SOB (shortness of breath)   . UC (ulcerative colitis) [facility])     Past Surgical History:  Procedure Laterality Date  . CHOLECYSTECTOMY    . LAPAROSCOPIC PARTIAL PROTECTOMY    . PERMANENT ILEOSTOMY    . TOTAL COLECTOMY N/A     Current Outpatient Medications  Medication Sig Dispense Refill  . acetaminophen (TYLENOL) 500 MG tablet Take 500-1,000 mg by mouth every 6 (six)  hours as needed (for pain).    Marland Kitchen acetaZOLAMIDE (DIAMOX) 500 MG capsule Take 1 capsule (500 mg total) by mouth 3 (three) times daily. 270 capsule 4  . ALPRAZolam (XANAX) 0.5 MG tablet Take 1 tablet (  0.5 mg total) by mouth daily as needed for anxiety. 20 tablet 1  . buPROPion (WELLBUTRIN XL) 150 MG 24 hr tablet Take 1 tablet (150 mg total) by mouth daily. 30 tablet 2  . Fremanezumab-vfrm (AJOVY) 225 MG/1.5ML SOAJ Inject 225 mg into the skin every 30 (thirty) days. 1 pen 11  . Incontinence Supplies (DRAINABLE FECAL COLLECTOR) MISC by Does not apply route.    Marland Kitchen OLANZapine-FLUoxetine (SYMBYAX) 6-50 MG capsule Take 1 capsule by mouth daily. 30 capsule 2  . ondansetron (ZOFRAN ODT) 4 MG disintegrating tablet Take 1 tablet (4 mg total) by mouth every 8 (eight) hours as needed for nausea or vomiting. 20 tablet 0  . Ostomy Supplies MISC by Does not apply route.    . pantoprazole (PROTONIX) 40 MG tablet Take 40 mg by mouth daily.    . Rimegepant Sulfate (NURTEC) 75 MG TBDP Take 75 mg by mouth daily as needed. For migraines. Take as close to onset of migraine as possible. One daily maximum. 10 tablet 6  . rizatriptan (MAXALT-MLT) 10 MG disintegrating tablet Take 1 tablet (10 mg total) by mouth as needed for migraine. May repeat in 2 hours if needed 9 tablet 11   No current facility-administered medications for this visit.    Allergies as of 10/24/2019 - Review Complete 10/24/2019  Allergen Reaction Noted  . Nsaids Other (See Comments) 07/17/2014  . Tolmetin Other (See Comments) 10/14/2014    Vitals: BP 122/83 (BP Location: Right Arm, Patient Position: Sitting)   Pulse 88   Temp 97.7 F (36.5 C) Comment: taken at front  Ht 5' 3"  (1.6 m)   Wt 245 lb (111.1 kg)   BMI 43.40 kg/m  Last Weight:  Wt Readings from Last 1 Encounters:  10/24/19 245 lb (111.1 kg)   Last Height:   Ht Readings from Last 1 Encounters:  10/24/19 5' 3"  (1.6 m)   Physical exam: Exam: Gen: NAD, conversant, well nourised,  obese, well groomed                     Eyes: Conjunctivae clear without exudates or hemorrhage  Neuro: Detailed Neurologic Exam  Speech:    Speech is normal; fluent and spontaneous with normal comprehension.  Cognition:    The patient is oriented to person, place, and time;     recent and remote memory intact;     language fluent;     normal attention, concentration,     fund of knowledge Cranial Nerves:    The pupils are equal, round, and reactive to light. Fundi without edema. Visual fields are full to finger confrontation. Extraocular movements are intact. Trigeminal sensation is intact and the muscles of mastication are normal. The face is symmetric. The palate elevates in the midline. Hearing intact. Voice is normal. Shoulder shrug is normal. The tongue has normal motion without fasciculations.   Gait:    normal native gait  Motor Observation:    No asymmetry, no atrophy, and no involuntary movements noted. Tone:    Normal muscle tone.    Posture:    Posture is normal. normal erect    Strength:    Strength is V/V in the upper and lower limbs.      Sensation: intact to LT         Assessment/Plan: 30 old female with papilledema, dizziness, positional headache, vision and hearing changes, obesity. Opening pressure was 34.5, improved on diamox but having difficulty with weight loss. Abnormal MRI with abnormal signal  within the medial temporal lobes bilaterally predominantly involving the hippocampal structures. 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 Repeat MRi showed subtle signal changes in the mesial temporal lobes unchanged, although initially worrisome for an  infectious or limbic encephalopathy, the stability and the absence of specific clinical symptoms make this less likely.  -Patient doing well.  Stable.  Tolerating the Diamox.  Does not want to stop until she loses significant amount of weight.  Saw Dr. Carolynn Sayers earlier this month exam stable without edema.  Continue Diamox for IIh  -For her migraine she is doing well on the Ajovy works mostly all month but she does have breakthrough when is time for her injection.  We did discuss do not get pregnant on this need to be off of it for 5 months if considering pregnancy  -Acutely since she has migraines infrequently she can take Maxalt, Nurtec, Tylenol acutely she can even take all 3 together for severe headache.   Arie Sabina for migraines: tried topamax, diamox, amitriptyline  Discussed weight loss gain as is critical and discussed weight in the pathophysiology of IIH; refer to Bariatric surgery, bariatric surgery is indicated due to her condition Continue diamox. Discussed again teratogenicity, she is getting married next year, advised weight loss before children, although diamox is generally considered compatible with pregnancy no medication is entirely safe. Diana Page advised we can decrease the medication if she loses 10% of body weight and we may be able to entirely stop the diamox with a healthy weight.   - Should proceed to ED if vision significantly worsens - Follow ups with [name] have been very positive, she sees him regularly - She is in the process of bariatric surgery - declined it due to her GI problems - maxalt acutely. For migraines, continue - Migraines: conitnue ajovy  No orders of the defined types were placed in this encounter.  Meds ordered this encounter  Medications  . Rimegepant Sulfate (NURTEC) 75 MG TBDP    Sig: Take 75 mg by mouth daily as needed. For migraines. Take as close to onset of migraine as possible. One daily maximum.    Dispense:  10 tablet    Refill:  6     Patient has copay card; she can have medication regardless of insurance approval or copay amount.  . rizatriptan (MAXALT-MLT) 10 MG disintegrating tablet    Sig: Take 1 tablet (10 mg total) by mouth as needed for migraine. May repeat in 2 hours if needed    Dispense:  9 tablet    Refill:  11  . acetaZOLAMIDE (DIAMOX) 500 MG capsule    Sig: Take 1 capsule (500 mg total) by mouth 3 (three) times daily.    Dispense:  270 capsule    Refill:  4  . Fremanezumab-vfrm (AJOVY) 225 MG/1.5ML SOAJ    Sig: Inject 225 mg into the skin every 30 (thirty) days.    Dispense:  1 pen    Refill:  11    Patient has copay card; she can have medication for $5 regardless of insurance approval or copay amount.     Sarina Ill, MD  [facility] [address] [place], [place] [address]  Phone [phone] Fax [phone]  I spent  15 minutes of face-to-face and non-face-to-face time with patient on the  1. Chronic migraine without aura, with intractable migraine, so stated, with status migrainosus    diagnosis.  This included previsit chart review, lab review, study review, order entry, electronic health record documentation, patient education on the different diagnostic and therapeutic options, counseling and coordination of care, risks and benefits of management, compliance, or risk factor reduction

## 2019-10-24 ENCOUNTER — Encounter: Payer: Self-pay | Admitting: Neurology

## 2019-10-24 ENCOUNTER — Other Ambulatory Visit: Payer: Self-pay

## 2019-10-24 ENCOUNTER — Ambulatory Visit: Payer: BC Managed Care – PPO | Admitting: Neurology

## 2019-10-24 DIAGNOSIS — G43711 Chronic migraine without aura, intractable, with status migrainosus: Secondary | ICD-10-CM | POA: Diagnosis not present

## 2019-10-24 MED ORDER — RIZATRIPTAN BENZOATE 10 MG PO TBDP
10.0000 mg | ORAL_TABLET | ORAL | 11 refills | Status: DC | PRN
Start: 2019-10-24 — End: 2020-01-23

## 2019-10-24 MED ORDER — NURTEC 75 MG PO TBDP: 75.0000 mg | ORAL_TABLET | Freq: Every day | ORAL | 6 refills | Status: DC | PRN

## 2019-10-24 MED ORDER — ACETAZOLAMIDE ER 500 MG PO CP12: 500.0000 mg | ORAL_CAPSULE | Freq: Three times a day (TID) | ORAL | 4 refills | Status: DC

## 2019-10-24 MED ORDER — AJOVY 225 MG/1.5ML ~~LOC~~ SOAJ: 225.0000 mg | SUBCUTANEOUS | 11 refills | Status: DC

## 2019-10-24 NOTE — Patient Instructions (Signed)
Ajovy monthly At the onset of migraine can take nurtec or maxalt or tylenol (for severe can take all 3 together)  Rimegepant oral dissolving tablet What is this medicine? RIMEGEPANT (ri ME je pant) is used to treat migraine headaches with or without aura. An aura is a strange feeling or visual disturbance that warns you of an attack. It is not used to prevent migraines. This medicine may be used for other purposes; ask your health care provider or pharmacist if you have questions. COMMON BRAND NAME(S): NURTEC ODT What should I tell my health care provider before I take this medicine? They need to know if you have any of these conditions:  kidney disease  liver disease  an unusual or allergic reaction to rimegepant, other medicines, foods, dyes, or preservatives  pregnant or trying to get pregnant  breast-feeding How should I use this medicine? Take the medicine by mouth. Follow the directions on the prescription label. Leave the tablet in the sealed blister pack until you are ready to take it. With dry hands, open the blister and gently remove the tablet. If the tablet breaks or crumbles, throw it away and take a new tablet out of the blister pack. Place the tablet in the mouth and allow it to dissolve, and then swallow. Do not cut, crush, or chew this medicine. You do not need water to take this medicine. Talk to your pediatrician about the use of this medicine in children. Special care may be needed. Overdosage: If you think you have taken too much of this medicine contact a poison control center or emergency room at once. NOTE: This medicine is only for you. Do not share this medicine with others. What if I miss a dose? This does not apply. This medicine is not for regular use. What may interact with this medicine? This medicine may interact with the following medications:  certain medicines for fungal infections like fluconazole, itraconazole  rifampin This list may not describe  all possible interactions. Give your health care provider a list of all the medicines, herbs, non-prescription drugs, or dietary supplements you use. Also tell them if you smoke, drink alcohol, or use illegal drugs. Some items may interact with your medicine. What should I watch for while using this medicine? Visit your health care professional for regular checks on your progress. Tell your health care professional if your symptoms do not start to get better or if they get worse. What side effects may I notice from receiving this medicine? Side effects that you should report to your doctor or health care professional as soon as possible:  allergic reactions like skin rash, itching or hives; swelling of the face, lips, or tongue Side effects that usually do not require medical attention (report these to your doctor or health care professional if they continue or are bothersome):  nausea This list may not describe all possible side effects. Call your doctor for medical advice about side effects. You may report side effects to FDA at 1-800-FDA-1088. Where should I keep my medicine? Keep out of the reach of children. Store at room temperature between 15 and 30 degrees C (59 and 86 degrees F). Throw away any unused medicine after the expiration date. NOTE: This sheet is a summary. It may not cover all possible information. If you have questions about this medicine, talk to your doctor, pharmacist, or health care provider.  2020 Elsevier/Gold Standard (2018-08-27 00:21:31) Rizatriptan disintegrating tablets What is this medicine? RIZATRIPTAN (rye za TRIP tan) is  used to treat migraines with or without aura. An aura is a strange feeling or visual disturbance that warns you of an attack. It is not used to prevent migraines. This medicine may be used for other purposes; ask your health care provider or pharmacist if you have questions. COMMON BRAND NAME(S): Maxalt-MLT What should I tell my health care  provider before I take this medicine? They need to know if you have any of these conditions:  cigarette smoker  circulation problems in fingers and toes  diabetes  heart disease  high blood pressure  high cholesterol  history of irregular heartbeat  history of stroke  kidney disease  liver disease  stomach or intestine problems  an unusual or allergic reaction to rizatriptan, other medicines, foods, dyes, or preservatives  pregnant or trying to get pregnant  breast-feeding How should I use this medicine? Take this medicine by mouth. Follow the directions on the prescription label. Leave the tablet in the sealed blister pack until you are ready to take it. With dry hands, open the blister and gently remove the tablet. If the tablet breaks or crumbles, throw it away and take a new tablet out of the blister pack. Place the tablet in the mouth and allow it to dissolve, and then swallow. Do not cut, crush, or chew this medicine. You do not need water to take this medicine. Do not take it more often than directed. Talk to your pediatrician regarding the use of this medicine in children. While this drug may be prescribed for children as young as 6 years for selected conditions, precautions do apply. Overdosage: If you think you have taken too much of this medicine contact a poison control center or emergency room at once. NOTE: This medicine is only for you. Do not share this medicine with others. What if I miss a dose? This does not apply. This medicine is not for regular use. What may interact with this medicine? Do not take this medicine with any of the following medicines:  certain medicines for migraine headache like almotriptan, eletriptan, frovatriptan, naratriptan, rizatriptan, sumatriptan, zolmitriptan  ergot alkaloids like dihydroergotamine, ergonovine, ergotamine, methylergonovine  MAOIs like Carbex, Eldepryl, Marplan, Nardil, and Parnate This medicine may also  interact with the following medications:  certain medicines for depression, anxiety, or psychotic disorders  propranolol This list may not describe all possible interactions. Give your health care provider a list of all the medicines, herbs, non-prescription drugs, or dietary supplements you use. Also tell them if you smoke, drink alcohol, or use illegal drugs. Some items may interact with your medicine. What should I watch for while using this medicine? Visit your healthcare professional for regular checks on your progress. Tell your healthcare professional if your symptoms do not start to get better or if they get worse. You may get drowsy or dizzy. Do not drive, use machinery, or do anything that needs mental alertness until you know how this medicine affects you. Do not stand up or sit up quickly, especially if you are an older patient. This reduces the risk of dizzy or fainting spells. Alcohol may interfere with the effect of this medicine. Your mouth may get dry. Chewing sugarless gum or sucking hard candy and drinking plenty of water may help. Contact your healthcare professional if the problem does not go away or is severe. If you take migraine medicines for 10 or more days a month, your migraines may get worse. Keep a diary of headache days and medicine use. Contact  your healthcare professional if your migraine attacks occur more frequently. What side effects may I notice from receiving this medicine? Side effects that you should report to your doctor or health care professional as soon as possible:  allergic reactions like skin rash, itching or hives, swelling of the face, lips, or tongue  chest pain or chest tightness  signs and symptoms of a dangerous change in heartbeat or heart rhythm like chest pain; dizziness; fast, irregular heartbeat; palpitations; feeling faint or lightheaded; falls; breathing problems  signs and symptoms of a stroke like changes in vision; confusion; trouble  speaking or understanding; severe headaches; sudden numbness or weakness of the face, arm or leg; trouble walking; dizziness; loss of balance or coordination  signs and symptoms of serotonin syndrome like irritable; confusion; diarrhea; fast or irregular heartbeat; muscle twitching; stiff muscles; trouble walking; sweating; high fever; seizures; chills; vomiting Side effects that usually do not require medical attention (report to your doctor or health care professional if they continue or are bothersome):  diarrhea  dizziness  drowsiness  dry mouth  headache  nausea, vomiting  pain, tingling, numbness in the hands or feet  stomach pain This list may not describe all possible side effects. Call your doctor for medical advice about side effects. You may report side effects to FDA at 1-800-FDA-1088. Where should I keep my medicine? Keep out of the reach of children. Store at room temperature between 15 and 30 degrees C (59 and 86 degrees F). Protect from light and moisture. Throw away any unused medicine after the expiration date. NOTE: This sheet is a summary. It may not cover all possible information. If you have questions about this medicine, talk to your doctor, pharmacist, or health care provider.  2020 Elsevier/Gold Standard (2017-12-26 14:58:08)

## 2019-11-27 ENCOUNTER — Other Ambulatory Visit (HOSPITAL_COMMUNITY): Payer: Self-pay | Admitting: Psychiatry

## 2019-11-27 DIAGNOSIS — F429 Obsessive-compulsive disorder, unspecified: Secondary | ICD-10-CM

## 2019-11-27 DIAGNOSIS — F330 Major depressive disorder, recurrent, mild: Secondary | ICD-10-CM

## 2019-12-05 ENCOUNTER — Encounter (HOSPITAL_COMMUNITY): Payer: Self-pay | Admitting: Psychiatry

## 2019-12-05 ENCOUNTER — Telehealth (INDEPENDENT_AMBULATORY_CARE_PROVIDER_SITE_OTHER): Payer: BC Managed Care – PPO | Admitting: Psychiatry

## 2019-12-05 ENCOUNTER — Other Ambulatory Visit: Payer: Self-pay

## 2019-12-05 DIAGNOSIS — F429 Obsessive-compulsive disorder, unspecified: Secondary | ICD-10-CM | POA: Diagnosis not present

## 2019-12-05 DIAGNOSIS — F330 Major depressive disorder, recurrent, mild: Secondary | ICD-10-CM

## 2019-12-05 MED ORDER — OLANZAPINE-FLUOXETINE HCL 6-50 MG PO CAPS: 1.0000 | ORAL_CAPSULE | Freq: Every day | ORAL | 2 refills | Status: DC

## 2019-12-05 NOTE — Progress Notes (Signed)
Virtual Visit via Telephone Note  I connected with Diana Page on 12/05/19 at 11:40 AM EDT by telephone and verified that I am speaking with the correct person using two identifiers.   I discussed the limitations, risks, security and privacy concerns of performing an evaluation and management service by telephone and the availability of in person appointments. I also discussed with the patient that there may be a patient responsible charge related to this service. The patient expressed understanding and agreed to proceed.  Patient location; mother's home in [place] Provider location; home office  History of Present Illness: Patient is evaluated by phone session.  She is doing very well.  She is excited because she got a job at [employer] and she is going to start very soon.  Currently she is visiting her mother in [place] for few days.  She is sleeping good.  She denies any irritability, anger, obsessive thoughts or any paranoia.  Her energy level is good.  She lives with her husband who is very supportive.  She like to come off from Wellbutrin as she is doing better.  She has no more panic attacks and not taken Xanax in a while.  She feels that she is doing well and is stable and try to come off from the Wellbutrin but wants to keep the Hunt.    Past Psychiatric History:Reviewed. H/O depression andOCD. TriedProzac in [place]. No h/o hallucination, paranoia, suicidal attempt or inpatient treatment. Hada good response with Symbyax.TriedXanax but made tired and dizziness. Luvox caused sexual side effects.   Psychiatric Specialty Exam: Physical Exam  Review of Systems  There were no vitals taken for this visit.There is no height or weight on file to calculate BMI.  General Appearance: NA  Eye Contact:  NA  Speech:  Clear and Coherent  Volume:  Normal  Mood:  Euthymic  Affect:  NA  Thought Process:  Goal Directed  Orientation:  Full (Time, Place, and Person)   Thought Content:  WDL  Suicidal Thoughts:  No  Homicidal Thoughts:  No  Memory:  Immediate;   Good Recent;   Good Remote;   Good  Judgement:  Good  Insight:  Good  Psychomotor Activity:  NA  Concentration:  Concentration: Good and Attention Span: Good  Recall:  Good  Fund of Knowledge:  Good  Language:  Good  Akathisia:  No  Handed:  Right  AIMS (if indicated):     Assets:  Communication Skills Desire for Improvement Housing Resilience Social Support Talents/Skills  ADL's:  Intact  Cognition:  WNL  Sleep:   good      Assessment and Plan: Obsessive-compulsive disorder.  Major depressive disorder, recurrent.  Patient doing better and she like to come off from the Wellbutrin.  She agreed if symptoms started to get worse then she can go back on the medication.  I recommend she can try taking Wellbutrin XL 150 mg every other day for next 2 weeks.  Continue Symbyax 6-50 at that time.  She does not need a new prescription of Xanax.  Discussed medication side effects and benefits.  Recommended to call us back if she has any question or any concern.  She excited about job which she will start in few days.  Follow-up in 3 months.  Follow Up Instructions:    I discussed the assessment and treatment plan with the patient. The patient was provided an opportunity to ask questions and all were answered. The patient agreed with the  plan and demonstrated an understanding of the instructions.   The patient was advised to call back or seek an in-person evaluation if the symptoms worsen or if the condition fails to improve as anticipated.  I provided 20 minutes of non-face-to-face time during this encounter.   Kathlee Nations, MD

## 2020-01-20 ENCOUNTER — Telehealth: Payer: Self-pay | Admitting: Neurology

## 2020-01-20 NOTE — Telephone Encounter (Signed)
Pt called needing appt for her headaches and pressure. NP MM and [facility] do not have anything available until Oct. Pt would like to know if she can be squeezed in or if a Lumbar Puncture can be ordered for her. Please advise.

## 2020-01-21 NOTE — Telephone Encounter (Signed)
Note pt called and has been scheduled for 01/23/20.

## 2020-01-21 NOTE — Telephone Encounter (Signed)
I called the Diana Page and LVM (ok per DPR) offering an appt with Dr. Jaynee Eagles for this Thurs 7/29 @ 1:30 pm. Asked for a call back or mychart message letting us know if she can come. Left office number in message.

## 2020-01-22 NOTE — Progress Notes (Signed)
[name]    Provider:  [name] Referring Provider: No ref. provider found Primary Care Physician:  Patient, No Pcp Per   CC: Headache and vision loss, dizziness  Interval history 01/23/2020: Patient with a history of IDIOPATHIC INTRACRANIAL HYPERTENSION and abnormal brain MRI who has been doing well on diamox who is here for worsening symptoms. She has been doing extremely well until recently, she has had several incidents with positional headache, when she gets up she hears the wooshing noise and pulsating/pounding, if she turns her head she has vision changes and vision changes, started 2 months ago, worsening, she has increased headaches, faint light in the corner of her eye and peripheral vision loss, she is having headaches at least 1/2 the month of 15 headache days a month, more pressure around the head all over. Unknown triggers, lasts a few hours. This is definitely a change, she takes the diamox, no weight gain.  Patient complains of symptoms per HPI as well as the following symptoms: headache, vision changes . Pertinent negatives and positives per HPI. All others negative   Interva history: Ajovy works really well. She starts having migraines when it is time to take another shot. 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 Works all month. Last 5 days she has breakthrough that can be significant, not always but can be. For month of the month no headaches at all. She doesn't want to change anything however, tylenol doesn't help acutely.  We discussed and she is doing so well and having migraines infrequently when she does have them to make sure that she treats them right away, will prescribe her Maxalt again she has never had a problem with triptans, will also give her Nurtec and advised her she can take multiple medications at once if needed.  I also reviewed Dr. Velvet Bathe notes, he recently saw her October 01, 2019,  visual field full again today, OCT RNFL shows definite improvement stable with no edema, tolerating the Diamox and symptoms are controlled, she does want to change anything until she loses a significant amount of weight.  Interval history 02/18/2019:  worsening headaches. She is now having 2 migraines days a week or more, more hedaches. Pounding, throbbing, unilateral, nausea, light/sound sensitivity, worse with movement, sitting still in a dark room, bending over makes it pound worse.  This is been ongoing for the last 6 months.  She is worried because she is also having positional headaches, and exertional headaches when she sneezes, she has a history of idiopathic intracranial hypertension and this makes her worry for for other etiologies.  Waking up with headaches now.  She has a history of abnormal enhancement in the brain last was 2017 we will repeat an MRI.  I discussed with her that this may be progression of migraines, she is very compliant with her Diamox and she has not gained weight.  In fact she is going to have bariatric surgery.  Given symptoms we will order MRI of the brain.  We will also start her on CGRP.  Interval history 11/07/2018: She was pursuing bariatric surgery and couldn;t get it due to a virus. She is going to contact it again. Motrin, tylenol not helpng when she does get a headache. She does get occ migraines, needs to take something 3x a month. We discussed choices for acute management in detail. For her migraines will try a triptan. It is pounding, throbbing, unilateral with light and sound sensitivity. Do  not recommend long-term opioids for pain management or acute migraines.  Interval history 10/24/2017: She is doing well. Still on the Diamox. Doing well. She will be going to Bariatric surgery referral which I recommend as weight loss is critical due to her condition and risk of permanent vision loss. IIH is associated with obesity and bariatric surgery is indicated in this case.  No headaches, no vision changes, feel stable on medication. She is having dizziness at night which can be a symptom, needs bariatric surgery.   Interval history 01/23/2017: She has had 2 headaches in the last 6 months but it was in the setting of dehydration.  Her weight is fluctuating. She was 212 recently which is her highest. She saw [name] for her weight and she was placed on metformin and had side effects. She is watching her diet, prefers to try differently. She is going back to school and quitting work, getting married in [place]. No headaches, vision is fine no vision changes, she sees [name] every 4 months, doing well on the diamox, visual fields are stable,  No more edema of the eyes. Discussed Bariatric surgery.    Interval history 07/25/2016: 29 year old here for follow up on IIH on acetazolamide and obesity. She is getting married in November. No dizziness. Rare headaches while on the medication however she is on 1560m diamox a day and having a difficult time losing weight which is critical in IIH. Her headaches are better,  1-2 since being last seen. No vision changes, no dizziness, no hearing changes. She is following weight watchers now and she saw a dietician but still having difficulty with weight loss.  She stopped eating meat and is worried about her B12 and iron. No snoring, no morning headache. She takes 15029macetazolamide. She has missed 3 periods but 2 pregnancy tests are negative. They use condoms.  She is on 150022miamox daily and I can decrease this dose if she can lose 10% of her body weight, we may be able to get her off of medication with even more weight loss. Will refer to the [facility] for weight loss. She is having a difficult time with weight loss and I recommend the [facility] [place] the role of weight in IIH and vision loss.   Interval history 03/21/2016: [age]ar old female with papilledema, dizziness,  positional headache, vision and hearing changes. Opening pressure was 34.5, improved on diamox. Abnormal MRI with abnormal signal within the medial temporal lobes bilaterally predominantly involving the hippocampal structures. She was working hard, she was not drinking water and she was lifting a lot of things and she was running all day and she felt lightheaded and dizzy and fatigued. She went and sat down for 5 minutes and she was ok. She was not drinking any water. She has one headache that lasted a whole day and a whole night it was terrible. She tried giving up caffeine. Likely caffeine withdrawal. Restared caffeine and fine. Vision has been fine. No snoring, no morning headaches. She takes 1500m80metazolamide.   Interval history 09/16/2015: She is having worsening blurry vision. This started within the last month. Things are not as crisp. No pressure. She is also hearing low level, wooshing in the ears. Worse when she is laying down and then stands. No headaches, no pressure. No side effects from the diamox. She has not been able to lose weight. Will keep her on the current dose and then  if weight loss of 10% will decreasing the diamox. Need to repeat MRi of the brain due to new worsneing vision and hearing issues given previously seen Abnormal signal within the medial temporal lobes bilaterally predominantly involving the hippocampal structures.  MRI 09/26/2015: FINDINGS:  The brain parenchyma shows no significant abnormalities except subtle prominence and thickening of the right hippocampus and medial temporal lobe which is less prominent compared to the previous scan and is of unclear significance. No other structural lesion, tumor infarcts are noted. Diffusion weighted imaging is negative for acute ischemia. Susceptibility weighted imaging is negative for microhemorrhages. Subarachnoid spaces and ventricular system appear normal. Extraction brain structures up and normal. Calvarium shows no  abnormalities. The pituitary gland and cerebellar tonsils appear normal. Flow voids of the large vessels are intact and circulation appear to be patent. Orbits and paranasal sinuses appear normal. Visualized portions of the cervical spine appears normal. Contrast images do not result in any abnormal areas of enhancement.     IMPRESSION: Slightly abnormal MRI scan of the brain showing subtle prominence of the right medial temporal lobe which is of unclear significance. Overall no significant change compared with previous MRI dated 01/16/2015.  Interval update: Opening pressure of LP was 34.5. 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 the thumping and pressure in her head have improved. The peripheral vision changes are gone, no headaches. She needs follow up with ophthalmology. She needs an ophthalmologist to follow in the area, will refer to [name]. She denies any current side effects from the diamox.   HPI: Diana Page is a 29 y.o. female here as a referral from Dr. Jeanie Cooks for dizziness.  PMHx obesity. She was seen in the ED for acute onset headache and dizziness with decreased peripheral vision. She had an abnormal MRI and was called back to the ED for lumbar puncture which showed negative/normal CSF analysis. Unfortunately the LP did not include an opening pressure. She felt much better after the LP however. Currently if she stands up, she gets a thumping in the head. She gets stars in her right >Left peripheral vision. She gets pressure in the head all over. If she lays on her stomach, the pressure is worse and she can feel her heartbeat in her head. She went to optometry who diagnosed edema of the optic disks. Right eye has blurry vision which is persistent. She has had  severe headaches. The dizziness has resolved after the LP. Symptoms felt much better after LP. Denies seizure-like events, staring, altered awareness. Symptoms are daily. No other associated focal neurologic deficits. No neck pain.   Reviewed notes, labs and imaging from outside physicians, which showed: MRi brain 12/27/2014: Personally reviewed images: 1. Abnormal signal within the medial temporal lobes bilaterally predominantly involving the hippocampal structures. This is concerning for infectious process including herpes encephalitis. Other viral encephalitides can give a similar picture. Limbic encephalitis can look like this, but is typically associated with lung cancer. Alternatively, this could be related to seizure activity. The patient has no history of seizures. 2. Question abnormal signal within the anterior aspect of the right optic nerve, potentially related to same process.   MRI Orbits 01/16/2015:Abnormal MRI of the orbits with and without contrast showing widening of the optic nerve sheaths and mild elevation of the optic nerve heads, findings are most  consistent with idiopathic intracranial hypertension (pseudotumor cerebri)  MRi brain 01/16/2015:  IMPRESSION: This is an MRI of the brain with and without contrast shows the following: 1. Widening of the optic nerve sheath. This can be observed with intracranial hypertension, and is commonly seen with pseudotumor cerebri. 2. The subtle signal changes in the mesial temporal lobes appear unchanged. Although initially worrisome for an infectious or limbic encephalopathy, the stability and the absence of specific clinical symptoms make this less likely. If clinically indicated, consider follow-up evaluation or follow-up CSF analysis  Labs:   CSF: protein, glucose, diff, cell count normal TSH, B12, B1, HIV wnl CRP, ESR elevated CMP unremarkable CBC w/ anemia   Patient complains of symptoms per HPI as well as the  following symptoms:migraines, weight gain/obesity. Pertinent negatives and positives per HPI. All others negative   Social History   Socioeconomic History  . Marital status: Married    Spouse name: Not on file  . Number of children: 0  . Years of education: Ba  . Highest education level: Not on file  Occupational History  . Occupation: Supervisor     Comment: Chief Operating Officer  Tobacco Use  . Smoking status: Former Smoker    Types: Cigarettes, E-cigarettes    Quit date: 06/27/2009    Years since quitting: 10.5  . Smokeless tobacco: Never Used  Vaping Use  . Vaping Use: Former  Substance and Sexual Activity  . Alcohol use: Yes    Alcohol/week: 0.0 standard drinks    Comment: occasionally, "maybe once or twice a year"  . Drug use: No  . Sexual activity: Not Currently    Partners: Male    Birth control/protection: Condom  Other Topics Concern  . Not on file  Social History Narrative   Lives at home with husband, Lennette Bihari.   Caffeine use: maybe 5 big cups daily   Right handed   Social Determinants of Health   Financial Resource Strain:   . Difficulty of Paying Living Expenses:   Food Insecurity:   . Worried About Charity fundraiser in the Last Year:   . Arboriculturist in the Last Year:   Transportation Needs:   . Film/video editor (Medical):   Marland Kitchen Lack of Transportation (Non-Medical):   Physical Activity:   . Days of Exercise per Week:   . Minutes of Exercise per Session:   Stress:   . Feeling of Stress :   Social Connections:   . Frequency of Communication with Friends and Family:   . Frequency of Social Gatherings with Friends and Family:   . Attends Religious Services:   . Active Member of Clubs or Organizations:   . Attends Archivist Meetings:   Marland Kitchen Marital Status:   Intimate Partner Violence:   . Fear of Current or Ex-Partner:   . Emotionally Abused:   Marland Kitchen Physically Abused:   . Sexually Abused:     Family History  Problem Relation Age of  Onset  . OCD Mother   . Diabetes Mother   . Depression Mother   . Anxiety disorder Mother   . Obesity Mother   . Obesity Father   . OCD Sister   . Depression Sister   . OCD Brother   . Anxiety disorder Brother     Past Medical History:  Diagnosis Date  . Anemia   . Anxiety   . Back pain   . Depression   . Fatty liver   . Gallbladder problem   .  GERD (gastroesophageal reflux disease)   . Hypertension    Intercranial Hypertension  . IBS (irritable bowel syndrome)   . Joint pain   . Obsessive compulsive disorder 06/14  . Ostomy nurse consultation   . Pancreatitis   . Pancreatitis    hx of   . SOB (shortness of breath)   . UC (ulcerative colitis) [facility])     Past Surgical History:  Procedure Laterality Date  . CHOLECYSTECTOMY    . LAPAROSCOPIC PARTIAL PROTECTOMY    . PERMANENT ILEOSTOMY    . TOTAL COLECTOMY N/A     Current Outpatient Medications  Medication Sig Dispense Refill  . acetaminophen (TYLENOL) 500 MG tablet Take 500-1,000 mg by mouth every 6 (six) hours as needed (for pain).    Marland Kitchen acetaZOLAMIDE (DIAMOX) 500 MG capsule Take 2 capsules (1,000 mg total) by mouth 2 (two) times daily. 360 capsule 4  . Fremanezumab-vfrm (AJOVY) 225 MG/1.5ML SOAJ Inject 225 mg into the skin every 30 (thirty) days. 1 pen 11  . Incontinence Supplies (DRAINABLE FECAL COLLECTOR) MISC by Does not apply route.    Marland Kitchen OLANZapine-FLUoxetine (SYMBYAX) 6-50 MG capsule Take 1 capsule by mouth daily. 30 capsule 2  . Ostomy Supplies MISC by Does not apply route.    Marland Kitchen oxyCODONE-acetaminophen (PERCOCET) 10-325 MG tablet Take 1 tablet by mouth every 4 (four) hours as needed for pain. 30 tablet 0  . Rimegepant Sulfate (NURTEC) 75 MG TBDP Take 75 mg by mouth daily as needed. For migraines. Take as close to onset of migraine as possible. One daily maximum. (Patient not taking: Reported on 01/23/2020) 10 tablet 6   No current facility-administered medications for this visit.    Allergies as of  01/23/2020 - Review Complete 01/23/2020  Allergen Reaction Noted  . Nsaids Other (See Comments) 07/17/2014  . Tolmetin Other (See Comments) 10/14/2014    Vitals: BP 123/79 (BP Location: Right Arm, Patient Position: Sitting)   Pulse 90   Ht 5' 2"  (1.575 m)   Wt (!) 244 lb (110.7 kg)   BMI 44.63 kg/m  Last Weight:  Wt Readings from Last 1 Encounters:  01/23/20 (!) 244 lb (110.7 kg)   Last Height:   Ht Readings from Last 1 Encounters:  01/23/20 5' 2"  (1.575 m)   Physical exam: Exam: Gen: NAD, conversant, well nourised, obese, well groomed                     CV: RRR, no MRG. No Carotid Bruits. No peripheral edema, warm, nontender Eyes: Conjunctivae clear without exudates or hemorrhage  Neuro: Detailed Neurologic Exam  Speech:    Speech is normal; fluent and spontaneous with normal comprehension.  Cognition:    The patient is oriented to person, place, and time;     recent and remote memory intact;     language fluent;     normal attention, concentration,     fund of knowledge Cranial Nerves:    The pupils are equal, round, and reactive to light. Fundi appear to have worsening papilledema/elevated.. Visual fields are full to finger confrontation. Extraocular movements are intact. Trigeminal sensation is intact and the muscles of mastication are normal. The face is symmetric. The palate elevates in the midline. Hearing intact. Voice is normal. Shoulder shrug is normal. The tongue has normal motion without fasciculations.   Coordination:    Normal finger to nose and heel to shin.   Gait:    Heel-toe and tandem gait are normal.   Motor  Observation:    No asymmetry, no atrophy, and no involuntary movements noted. Tone:    Normal muscle tone.    Posture:    Posture is normal. normal erect    Strength:    Strength is V/V in the upper and lower limbs.      Sensation: intact to LT     Reflex Exam:  DTR's:    Deep tendon reflexes in the upper and lower extremities  are normal bilaterally.   Toes:    The toes are downgoing bilaterally.   Clonus:    Clonus is absent.         Assessment/Plan: 84 old female with worseninf papilledema, dizziness, positional headache, vision and hearing changes, obesity. Opening pressure was 34.5, improved on diamox but having difficulty with weight loss. Abnormal MRI with abnormal signal within the medial temporal lobes bilaterally predominantly involving the hippocampal structures. She is having worsening symptoms, vision changes, hearing changes concerning for elevated pressure again, papilledema worse, need LP, MRI of the brain w/wo contrast asap.   - will call Dr. Zenia Resides office and get her appointment for evaluation  Orders Placed This Encounter  Procedures  . DG FLUORO GUIDED LOC OF NEEDLE/CATH TIP FOR SPINAL INJECT LT  . MR BRAIN W WO CONTRAST  . MR ORBITS W WO CONTRAST  . Acetazolamide, (Diamox), S/P  . CBC  . 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 Repeat MRi showed subtle signal changes in the mesial temporal lobes unchanged, although initially worrisome for an infectious or limbic encephalopathy, the stability and the absence of specific clinical symptoms make this less likely.  -For her migraine she is doing well on the Ajovy works mostly all month but she does have breakthrough when is time for her injection.  We did discuss do not get pregnant on this need to be off of it for 5 months if considering pregnancy  -Acutely since she has migraines infrequently she can take Maxalt, Nurtec, Tylenol acutely she can even take all 3 together for severe headache.   Arie Sabina for migraines: tried topamax, diamox, amitriptyline  Discussed weight loss gain as is  critical and discussed weight in the pathophysiology of IIH; refer to Bariatric surgery, bariatric surgery is indicated due to her condition Continue diamox. Discussed again teratogenicity, she is getting married next year, advised weight loss before children, although diamox is generally considered compatible with pregnancy no medication is entirely safe. Diana Page advised we can decrease the medication if she loses 10% of body weight and we may be able to entirely stop the diamox with a healthy weight.   - Should proceed to ED if vision significantly worsens - Follow ups with [name] have been very positive, she sees him regularly - She is in the process of bariatric surgery - declined it due to her GI problems - maxalt acutely. For migraines, continue - Migraines: conitnue ajovy  Orders Placed This Encounter  Procedures  . DG FLUORO GUIDED LOC OF NEEDLE/CATH TIP FOR SPINAL INJECT LT  . MR BRAIN W WO CONTRAST  . MR ORBITS W WO CONTRAST  . Acetazolamide, (Diamox), S/P  . CBC  . Comprehensive metabolic panel   Meds ordered this encounter  Medications  . acetaZOLAMIDE (DIAMOX) 500 MG capsule  Sig: Take 2 capsules (1,000 mg total) by mouth 2 (two) times daily.    Dispense:  360 capsule    Refill:  4  . oxyCODONE-acetaminophen (PERCOCET) 10-325 MG tablet    Sig: Take 1 tablet by mouth every 4 (four) hours as needed for pain.    Dispense:  30 tablet    Refill:  0   I spent 40 minutes of face-to-face and non-face-to-face time with patient on the  1. Positional headache   2. Morning headache   3. Vision changes   4. Worsening headaches   5. IIH (idiopathic intracranial hypertension)   6. Papilledema    diagnosis.  This included previsit chart review, lab review, study review, order entry, electronic health record documentation, patient education on the different diagnostic and therapeutic options, counseling and coordination of care, risks and benefits of management, compliance, or risk  factor reduction   Sarina Ill, MD  [facility] [address] [place],  [address]  Phone [phone] Fax [phone]  I spent 40 minutes of face-to-face and non-face-to-face time with patient on the  1. Positional headache   2. Morning headache   3. Vision changes   4. Worsening headaches   5. IIH (idiopathic intracranial hypertension)   6. Papilledema    diagnosis.  This included previsit chart review, lab review, study review, order entry, electronic health record documentation, patient education on the different diagnostic and therapeutic options, counseling and coordination of care, risks and benefits of management, compliance, or risk factor reduction

## 2020-01-23 ENCOUNTER — Other Ambulatory Visit: Payer: Self-pay

## 2020-01-23 ENCOUNTER — Ambulatory Visit: Payer: BC Managed Care – PPO | Admitting: Neurology

## 2020-01-23 ENCOUNTER — Encounter: Payer: Self-pay | Admitting: Neurology

## 2020-01-23 VITALS — BP 123/79 | HR 90 | Ht 62.0 in | Wt 244.0 lb

## 2020-01-23 DIAGNOSIS — R51 Headache with orthostatic component, not elsewhere classified: Secondary | ICD-10-CM | POA: Diagnosis not present

## 2020-01-23 DIAGNOSIS — R519 Headache, unspecified: Secondary | ICD-10-CM

## 2020-01-23 DIAGNOSIS — H539 Unspecified visual disturbance: Secondary | ICD-10-CM | POA: Diagnosis not present

## 2020-01-23 DIAGNOSIS — G932 Benign intracranial hypertension: Secondary | ICD-10-CM

## 2020-01-23 DIAGNOSIS — H4710 Unspecified papilledema: Secondary | ICD-10-CM

## 2020-01-23 MED ORDER — OXYCODONE-ACETAMINOPHEN 10-325 MG PO TABS: 1.0000 | ORAL_TABLET | ORAL | 0 refills | Status: DC | PRN

## 2020-01-23 MED ORDER — ACETAZOLAMIDE ER 500 MG PO CP12: 1000.0000 mg | ORAL_CAPSULE | Freq: Two times a day (BID) | ORAL | 4 refills | Status: DC

## 2020-01-23 NOTE — Telephone Encounter (Signed)
Completed renewal Ajovy PA on Cover My Meds. Key: [id number]. Approved immediately by [employer].   [id number];[id number];Review Type:Prior Auth;Coverage Start Date:12/24/2019;Coverage End Date:01/22/2021;

## 2020-01-23 NOTE — Patient Instructions (Addendum)
For any worsening go directly to the ED Please call [name] for appointment asap      Epidural Blood Patch for Spinal Headache, Care After This sheet gives you information about how to care for yourself after your procedure. Your health care provider may also give you more specific instructions. If you have problems or questions, contact your health care provider. What can I expect after the procedure? After the procedure, it is common to have:  Relief from your headache almost right away, or slow easing of pain over the next 24 hours.  Mild backaches. These may last for a few days.  A mild feeling of prickly, tingly, or numb skin (paresthesia).  Neck pain.  Pain down your arm or leg (radiating pain). Follow these instructions at home:  Injection site care   Follow instructions from your health care provider about how to take care of your injection site. Make sure you: ? Wash your hands with soap and water before and after you change your bandage (dressing). If soap and water are not available, use hand sanitizer. ? Change or remove your dressing as told by your health care provider.  Check your injection area every day for signs of infection. Check for: ? Redness, swelling, or pain. ? Fluid or blood. ? Warmth. ? Pus or a bad smell. Activity   For the first 24 hours after the procedure, rest in bed as much as you can.  Do not drive for 24 hours if you were given a sedative during your procedure.  When picking up items from a low position, squat by bending your knees. Do not bend at the waist.  Avoid too much straining, including while using the toilet. This can cause the blood patch to move out of position and cause the headache to return.  Do not lift anything that is heavier than 10 lb (4.5 kg), or the limit that you are told, until your health care provider says that it is safe.  Return to your normal activities as told by your health care provider. Ask your  health care provider what activities are safe for you. General instructions  Drink enough fluids to keep your urine pale yellow.  Do not take baths, swim, or use a hot tub until your health care provider approves. Ask your health care provider if you may take showers. You may only be allowed to take sponge baths.  Take over-the-counter and prescription medicines only as told by your health care provider.  Keep all follow-up visits as told by your health care provider. This is important. Contact a health care provider if:  You have redness, swelling, or pain around your injection site.  You have fluid or blood coming from your injection site.  Your injection site feels warm to the touch.  You have pus or a bad smell coming from your injection site.  You have a fever.  You have back pain or pain that goes down your legs, or you have trouble with your bowels or bladder.  Your headache comes back. Get help right away if you have:  A very bad headache.  Nausea or vomiting. Summary  After the procedure, it is common to have relief from your headache, mild backaches, mild numbness, neck pain, and some pain down your arm or leg.  Follow instructions from your health care provider about how to take care of your injection site.  For the first 24 hours after the procedure, rest in bed as much as  you can.  Get help right away if you have a very bad headache, nausea, or vomiting. This information is not intended to replace advice given to you by your health care provider. Make sure you discuss any questions you have with your health care provider. Document Revised: 01/09/2019 Document Reviewed: 01/09/2019 Elsevier Patient Education  [place].

## 2020-01-29 ENCOUNTER — Telehealth: Payer: Self-pay | Admitting: Neurology

## 2020-01-29 NOTE — Telephone Encounter (Signed)
[employer] Auth: [id number] (exp. 01/29/20 to 02/27/20) patient is scheduled at [facility] for 02/21/20.

## 2020-02-05 LAB — CBC
Hematocrit: 41.5 % (ref 34.0–46.6)
Hemoglobin: 13.3 g/dL (ref 11.1–15.9)
MCH: 26.9 pg (ref 26.6–33.0)
MCHC: 32 g/dL (ref 31.5–35.7)
MCV: 84 fL (ref 79–97)
Platelets: 322 10*3/uL (ref 150–450)
RBC: 4.95 x10E6/uL (ref 3.77–5.28)
RDW: 13.9 % (ref 11.7–15.4)
WBC: 9.6 10*3/uL (ref 3.4–10.8)

## 2020-02-05 LAB — COMPREHENSIVE METABOLIC PANEL
ALT: 42 IU/L — ABNORMAL HIGH (ref 0–32)
AST: 37 IU/L (ref 0–40)
Albumin/Globulin Ratio: 1.5 (ref 1.2–2.2)
Albumin: 4.8 g/dL (ref 3.8–4.8)
Alkaline Phosphatase: 174 IU/L — ABNORMAL HIGH (ref 48–121)
BUN/Creatinine Ratio: 13 (ref 9–23)
BUN: 11 mg/dL (ref 6–20)
Bilirubin Total: <0.2 mg/dL (ref 0.0–1.2)
CO2: 14 mmol/L — ABNORMAL LOW (ref 20–29)
Calcium: 9.3 mg/dL (ref 8.7–10.2)
Chloride: 106 mmol/L (ref 96–106)
Creatinine, Ser: 0.87 mg/dL (ref 0.57–1.00)
GFR calc Af Amer: 103 mL/min/{1.73_m2} (ref >59)
GFR calc non Af Amer: 89 mL/min/{1.73_m2} (ref >59)
Globulin, Total: 3.1 g/dL (ref 1.5–4.5)
Glucose: 97 mg/dL (ref 65–99)
Potassium: 4.4 mmol/L (ref 3.5–5.2)
Sodium: 139 mmol/L (ref 134–144)
Total Protein: 7.9 g/dL (ref 6.0–8.5)

## 2020-02-05 LAB — ACETAZOLAMIDE, (DIAMOX), S/P: Acetazolamide: 12.1 ug/ml

## 2020-02-10 ENCOUNTER — Ambulatory Visit
Admission: RE | Admit: 2020-02-10 | Discharge: 2020-02-10 | Disposition: A | Discharge status: Home / Self Care | Payer: Managed Care, Other (non HMO) | Source: Ambulatory Visit | Attending: Neurology | Admitting: Neurology

## 2020-02-10 ENCOUNTER — Other Ambulatory Visit: Payer: Self-pay

## 2020-02-10 ENCOUNTER — Other Ambulatory Visit: Payer: Self-pay | Admitting: Neurology

## 2020-02-10 DIAGNOSIS — R51 Headache with orthostatic component, not elsewhere classified: Secondary | ICD-10-CM

## 2020-02-10 DIAGNOSIS — R519 Headache, unspecified: Secondary | ICD-10-CM

## 2020-02-10 DIAGNOSIS — H539 Unspecified visual disturbance: Secondary | ICD-10-CM

## 2020-02-10 DIAGNOSIS — G932 Benign intracranial hypertension: Secondary | ICD-10-CM

## 2020-02-10 DIAGNOSIS — H4710 Unspecified papilledema: Secondary | ICD-10-CM

## 2020-02-10 MED ORDER — OXYCODONE-ACETAMINOPHEN 10-325 MG PO TABS: 1.0000 | ORAL_TABLET | ORAL | 0 refills | Status: DC | PRN

## 2020-02-10 MED ORDER — GADOBENATE DIMEGLUMINE 529 MG/ML IV SOLN
15.0000 mL | Freq: Once | INTRAVENOUS | Status: AC | PRN
  Administered 2020-02-10: 15 mL via INTRAVENOUS

## 2020-02-10 NOTE — Telephone Encounter (Signed)
Pt called for refill oxyCODONE-acetaminophen (PERCOCET) 10-325 MG tablet at [id number] Need oxyCODONE-acetaminophen (PERCOCET) 10-325 MG tablet Until Lumbar puncture appt.

## 2020-02-11 ENCOUNTER — Telehealth: Payer: Self-pay | Admitting: Neurology

## 2020-02-11 NOTE — Telephone Encounter (Signed)
Received a PA renewal request for Ajovy. Key is [id number]. Submitted demographic information via CMM. A question set was not available afterwards. Will check back this afternoon to see if the question set is available.

## 2020-02-13 NOTE — Telephone Encounter (Signed)
Checked on status of PA on [email or web address], question set is still not available. I called [facility] to see if the PA could be done over the phone. Was advised by the rep that once the PA has been started online, it can not be done over the phone. She advised to check the status on Monday morning. If the status has not changed, to call ES back.   Will hold until Monday morning.

## 2020-02-17 NOTE — Telephone Encounter (Signed)
Received question set via Fax. Will complete paperwork, have Dr. Jaynee Eagles sign and fax back to [facility] today.

## 2020-02-21 ENCOUNTER — Other Ambulatory Visit: Payer: BC Managed Care – PPO

## 2020-02-24 ENCOUNTER — Other Ambulatory Visit: Payer: Self-pay

## 2020-02-24 ENCOUNTER — Ambulatory Visit
Admission: RE | Admit: 2020-02-24 | Discharge: 2020-02-24 | Disposition: A | Discharge status: Home / Self Care | Payer: Managed Care, Other (non HMO) | Source: Ambulatory Visit | Attending: Neurology | Admitting: Neurology

## 2020-02-24 VITALS — BP 128/75 | HR 69

## 2020-02-24 DIAGNOSIS — R51 Headache with orthostatic component, not elsewhere classified: Secondary | ICD-10-CM

## 2020-02-24 DIAGNOSIS — H4710 Unspecified papilledema: Secondary | ICD-10-CM

## 2020-02-24 DIAGNOSIS — G932 Benign intracranial hypertension: Secondary | ICD-10-CM

## 2020-02-24 DIAGNOSIS — H539 Unspecified visual disturbance: Secondary | ICD-10-CM

## 2020-02-24 DIAGNOSIS — R519 Headache, unspecified: Secondary | ICD-10-CM

## 2020-02-24 LAB — GLUCOSE, CSF: Glucose, CSF: 66 mg/dL (ref 40–80)

## 2020-02-24 LAB — CSF CELL COUNT WITH DIFFERENTIAL
RBC Count, CSF: 16 cells/uL — ABNORMAL HIGH
WBC, CSF: 3 cells/uL (ref 0–5)

## 2020-02-24 LAB — PROTEIN, CSF: Total Protein, CSF: 39 mg/dL (ref 15–45)

## 2020-02-24 NOTE — Discharge Instructions (Signed)

## 2020-03-05 ENCOUNTER — Encounter (HOSPITAL_COMMUNITY): Payer: Self-pay | Admitting: Psychiatry

## 2020-03-05 ENCOUNTER — Other Ambulatory Visit: Payer: Self-pay

## 2020-03-05 ENCOUNTER — Telehealth (INDEPENDENT_AMBULATORY_CARE_PROVIDER_SITE_OTHER): Payer: 59 | Admitting: Psychiatry

## 2020-03-05 DIAGNOSIS — F330 Major depressive disorder, recurrent, mild: Secondary | ICD-10-CM

## 2020-03-05 DIAGNOSIS — F429 Obsessive-compulsive disorder, unspecified: Secondary | ICD-10-CM

## 2020-03-05 MED ORDER — OLANZAPINE-FLUOXETINE HCL 6-50 MG PO CAPS: 1.0000 | ORAL_CAPSULE | Freq: Every day | ORAL | 0 refills | Status: DC

## 2020-03-05 MED ORDER — VORTIOXETINE HBR 10 MG PO TABS: 10.0000 mg | ORAL_TABLET | Freq: Every day | ORAL | 0 refills | Status: DC

## 2020-03-05 NOTE — Progress Notes (Signed)
Virtual Visit via Telephone Note  I connected with Diana Page on 03/05/20 at 10:20 AM EDT by telephone and verified that I am speaking with the correct person using two identifiers.  Location: Patient: home Provider: home office   I discussed the limitations, risks, security and privacy concerns of performing an evaluation and management service by telephone and the availability of in person appointments. I also discussed with the patient that there may be a patient responsible charge related to this service. The patient expressed understanding and agreed to proceed.   History of Present Illness: Patient is evaluated by phone session.  She is experiencing increased depression, feeling of hopelessness and lack of motivation to do things.  She is not sure what triggered the symptoms but noticed that she has no interest lately.  She is sleeping too much.  She is taking Symbyax and recently started taking Xanax very frequently to help with anxiety.  She quit her job a month ago because of longstanding.  She had started working at [employer] but the job did not last long because longstanding hours.  Recently she has seen neurology and have blood work.  She was given narcotic pain medication but she stopped taking after she felt more tired.  She is taking Symbyax.  She used to take Wellbutrin but now she reported it did not work very well.  She has no tremors, shakes or any EPS.  She denies any hallucination, paranoia, suicidal thoughts.  She like to try something decide Symbyax to help her depression.  She denies any irritability, anger, mania.  She lives with her husband who is supportive.   Past Psychiatric History: [place]oh/oh[place]ucination, paranoia, suicidal attempt, inpatient treatment. Symbyax worked. TriedXanax as needed. Luvox caused sexual side effects and Wellbutrin did not helped..   Recent Results (from the past 2160  hour(s))  Acetazolamide, (Diamox), S/P     Status: None   Collection Time: 01/23/20  2:14 PM  Result Value Ref Range   Acetazolamide 12.1 ug/ml    Comment:                    Expected concentration range in patients                    on anticonvulsant doses of acetazolamide:                    6.0 - 15.0 ug/ml.  This test was developed and its performance characteristics determined by [facility]. It has not been cleared or approved by the Food and Drug Administration.   CBC     Status: None   Collection Time: 01/23/20  2:14 PM  Result Value Ref Range   WBC 9.6 3.4 - 10.8 x10E3/uL   RBC 4.95 3.77 - 5.28 x10E6/uL   Hemoglobin 13.3 11.1 - 15.9 g/dL   Hematocrit 41.5 34.0 - 46.6 %   MCV 84 79 - 97 fL   MCH 26.9 26.6 - 33.0 pg   MCHC 32.0 31 - 35 g/dL   RDW 13.9 11.7 - 15.4 %   Platelets 322 150 - 450 x10E3/uL  Comprehensive metabolic panel     Status: Abnormal   Collection Time: 01/23/20  2:14 PM  Result Value Ref Range   Glucose 97 65 - 99 mg/dL   BUN 11 6 - 20 mg/dL   Creatinine, Ser 0.87 0.57 - 1.00 mg/dL   GFR calc non Af Amer 89 >59  mL/min/1.73   GFR calc Af Amer 103 >59 mL/min/1.73    Comment: **[facility] currently reports eGFR in compliance with the current**   recommendations of the [employer]. [facility] will   update reporting as new guidelines are published from the NKF-ASN   Task force.    BUN/Creatinine Ratio 13 9 - 23   Sodium 139 134 - 144 mmol/L   Potassium 4.4 3.5 - 5.2 mmol/L   Chloride 106 96 - 106 mmol/L   CO2 14 (L) 20 - 29 mmol/L    Comment: Specimen quantity insufficient for verification by repeat analysis.   Calcium 9.3 8.7 - 10.2 mg/dL   Total Protein 7.9 6.0 - 8.5 g/dL   Albumin 4.8 3.8 - 4.8 g/dL   Globulin, Total 3.1 1.5 - 4.5 g/dL   Albumin/Globulin Ratio 1.5 1.2 - 2.2   Bilirubin Total <0.2 0.0 - 1.2 mg/dL   Alkaline Phosphatase 174 (H) 48 - 121 IU/L   AST 37 0 - 40 IU/L   ALT 42 (H) 0 - 32 IU/L  CSF cell count with differential      Status: Abnormal   Collection Time: 02/24/20 11:42 AM  Result Value Ref Range   Color, CSF COLORLESS COLORLESS   Appearance, CSF CLEAR CLEAR   RBC Count, CSF 16 (H) 0 cells/uL   WBC, CSF 3 0 - 5 cells/uL   Segmented Neutrophils-CSF CANCELED     Comment: Unable to report due to insufficient analyzable cells.  Result canceled by the ancillary.    Comment:      Comment: TUBE 1  Protein, CSF     Status: None   Collection Time: 02/24/20 11:42 AM  Result Value Ref Range   Total Protein, CSF 39 15 - 45 mg/dL  Glucose, CSF     Status: None   Collection Time: 02/24/20 11:42 AM  Result Value Ref Range   Glucose, CSF 66 40 - 80 mg/dL    Psychiatric Specialty Exam: Physical Exam  Review of Systems  Weight 244 lb (110.7 kg).There is no height or weight on file to calculate BMI.  General Appearance: NA  Eye Contact:  NA  Speech:  Slow  Volume:  Decreased  Mood:  Depressed and Dysphoric  Affect:  NA  Thought Process:  Goal Directed  Orientation:  Full (Time, Place, and Person)  Thought Content:  Rumination  Suicidal Thoughts:  No  Homicidal Thoughts:  No  Memory:  Immediate;   Good Recent;   Good Remote;   Good  Judgement:  Intact  Insight:  Present  Psychomotor Activity:  NA  Concentration:  Concentration: Fair and Attention Span: Fair  Recall:  Good  Fund of Knowledge:  Good  Language:  Good  Akathisia:  No  Handed:  Right  AIMS (if indicated):     Assets:  Communication Skills Desire for Improvement Housing Social Support  ADL's:  Intact  Cognition:  WNL  Sleep:   too much      Assessment and Plan: Obsessive-compulsive disorder.  Major depressive disorder, recurrent.  I reviewed her chart.  I do believe patient did well with addition of Wellbutrin but she decided to come off after she was feeling better but now today she feels that Wellbutrin did not help.  She like to try a different medication.  I reviewed blood work results.  Her BUN/creatinine is okay.   She is no longer taking pain medicine which was given for headaches.  She resumed Xanax to help with  anxiety.  She has leftover.  I recommended to try low-dose Trintellix along with Symbyax.  She is in therapy regularly.  Her therapist in [place] but she does talk on the phone regularly.  Discussed medication side effects and benefits.  I recommended to call us back if she feels worsening of the symptom.  Follow-up in 4 weeks.  Follow Up Instructions:    I discussed the assessment and treatment plan with the patient. The patient was provided an opportunity to ask questions and all were answered. The patient agreed with the plan and demonstrated an understanding of the instructions.   The patient was advised to call back or seek an in-person evaluation if the symptoms worsen or if the condition fails to improve as anticipated.  I provided 29 minutes of non-face-to-face time during this encounter.   Kathlee Nations, MD

## 2020-03-16 ENCOUNTER — Telehealth (HOSPITAL_COMMUNITY): Payer: Self-pay | Admitting: *Deleted

## 2020-03-16 NOTE — Telephone Encounter (Signed)
Patient called stating insurance would not approve Trintellix.  Made call to pharmacy and insurance company.  Prior approval obtained until 06/26/2098.  Case number [id number].  Returned call to patient. Patient to obtain medication. FYI

## 2020-03-16 NOTE — Telephone Encounter (Signed)
Thanks

## 2020-03-18 ENCOUNTER — Telehealth (HOSPITAL_COMMUNITY): Payer: Self-pay | Admitting: *Deleted

## 2020-03-18 NOTE — Telephone Encounter (Signed)
Ok

## 2020-03-18 NOTE — Telephone Encounter (Signed)
Despite Manufacturing engineer received today from [employer] [employer] PA for Trintellix 25m has been approved per pt pharmacy and as noted in encounter from KMaggie Font Sorry for the confusion.

## 2020-03-18 NOTE — Telephone Encounter (Signed)
Thanks. Will find out if Viibryd samples helping or not.

## 2020-03-18 NOTE — Telephone Encounter (Signed)
We definitely can. It will require a PA. I can offer samples until PA submitted and resulted.

## 2020-03-18 NOTE — Telephone Encounter (Signed)
Can we try Viibryd 10 mg for 1 week and then 20 mg daily.

## 2020-03-18 NOTE — Telephone Encounter (Signed)
Pa, Key # [id number], for Trintellix was denied. meds that are covered include Cymbalta, Lexapro, Celexa, Paxil, zoloft, and Prozac. meds that may be covered Viibryd and Effexor XR. Please review and advise.

## 2020-03-31 ENCOUNTER — Telehealth (INDEPENDENT_AMBULATORY_CARE_PROVIDER_SITE_OTHER): Payer: 59 | Admitting: Psychiatry

## 2020-03-31 ENCOUNTER — Encounter (HOSPITAL_COMMUNITY): Payer: Self-pay | Admitting: Psychiatry

## 2020-03-31 ENCOUNTER — Other Ambulatory Visit: Payer: Self-pay

## 2020-03-31 DIAGNOSIS — F429 Obsessive-compulsive disorder, unspecified: Secondary | ICD-10-CM | POA: Diagnosis not present

## 2020-03-31 DIAGNOSIS — F330 Major depressive disorder, recurrent, mild: Secondary | ICD-10-CM

## 2020-03-31 MED ORDER — VORTIOXETINE HBR 10 MG PO TABS: 10.0000 mg | ORAL_TABLET | Freq: Every day | ORAL | 2 refills | Status: DC

## 2020-03-31 MED ORDER — OLANZAPINE-FLUOXETINE HCL 6-50 MG PO CAPS: 1.0000 | ORAL_CAPSULE | Freq: Every day | ORAL | 2 refills | Status: DC

## 2020-03-31 NOTE — Progress Notes (Signed)
Virtual Visit via Telephone Note  I connected with Diana Page on 03/31/20 at 11:20 AM EDT by telephone and verified that I am speaking with the correct person using two identifiers.  Location: Patient: Home Provider: Home office   I discussed the limitations, risks, security and privacy concerns of performing an evaluation and management service by telephone and the availability of in person appointments. I also discussed with the patient that there may be a patient responsible charge related to this service. The patient expressed understanding and agreed to proceed.   History of Present Illness: Patient is evaluated by phone session.  She is now taking Trintellix 10 mg but is finally approved by her insurance.  She noticed much improvement with the medication.  She is more motivated and active.  She is not sleeping too much.  Her parents are also visiting and she is very happy about it.  She reported no tremors, shakes or any EPS.  She noticed improvement in her mood and she does not reported any crying spells or any feeling of hopelessness or worthlessness.  She is taking Symbyax.  She has not taken Xanax in past 2 weeks.  She lives with her husband who is supportive.  Patient denies any mania, psychosis, hallucination.  Her obsessive thoughts are under control.  She denies any major panic attack.  Her appetite is okay.  She is not tired and wants to continue current medication.  Past Psychiatric History: [place]oh/ohallucination, paranoia, suicidal attempt, inpatient treatment. Symbyax worked. TriedXanax as needed. Luvox caused sexual side effects and Wellbutrin did not helped.Marland Kitchen  Psychiatric Specialty Exam: Physical Exam  Review of Systems  Weight 244 lb (110.7 kg).There is no height or weight on file to calculate BMI.  General Appearance: NA  Eye Contact:  NA  Speech:  Clear and Coherent  Volume:  Normal  Mood:  Euthymic   Affect:  NA  Thought Process:  Goal Directed  Orientation:  Full (Time, Place, and Person)  Thought Content:  Logical  Suicidal Thoughts:  No  Homicidal Thoughts:  No  Memory:  Immediate;   Good Recent;   Good Remote;   Good  Judgement:  Good  Insight:  Present  Psychomotor Activity:  NA  Concentration:  Concentration: Good and Attention Span: Good  Recall:  Good  Fund of Knowledge:  Good  Language:  Good  Akathisia:  No  Handed:  Right  AIMS (if indicated):     Assets:  Communication Skills Desire for Improvement Housing Resilience Social Support Transportation  ADL's:  Intact  Cognition:  WNL  Sleep:   good      Assessment and Plan: Obsessive-compulsive disorder.  Major depressive disorder, recurrent.  Patient doing better since we added low-dose Trintellix.  It took some time to get approved from [employer] but now she feels it is working well.  She reported no side effects.  She is in therapy with her therapist who is in [place].  She is also happy since her parents are visiting her.  She like to continue current medication.  Continue Symbyax 6/50 once a day and Trintellix 10 mg daily.  She does not need a new prescription of Xanax.  Recommended to call us back if she has any question or any concern.  Follow-up in 3 months.  Follow Up Instructions:    I discussed the assessment and treatment plan with the patient. The patient was provided an opportunity to ask questions and  all were answered. The patient agreed with the plan and demonstrated an understanding of the instructions.   The patient was advised to call back or seek an in-person evaluation if the symptoms worsen or if the condition fails to improve as anticipated.  I provided 15 minutes of non-face-to-face time during this encounter.   Kathlee Nations, MD

## 2020-06-30 ENCOUNTER — Encounter (HOSPITAL_COMMUNITY): Payer: Self-pay | Admitting: Psychiatry

## 2020-06-30 ENCOUNTER — Telehealth (INDEPENDENT_AMBULATORY_CARE_PROVIDER_SITE_OTHER): Payer: Managed Care, Other (non HMO) | Admitting: Psychiatry

## 2020-06-30 ENCOUNTER — Other Ambulatory Visit: Payer: Self-pay

## 2020-06-30 DIAGNOSIS — F330 Major depressive disorder, recurrent, mild: Secondary | ICD-10-CM

## 2020-06-30 DIAGNOSIS — F429 Obsessive-compulsive disorder, unspecified: Secondary | ICD-10-CM

## 2020-06-30 MED ORDER — VORTIOXETINE HBR 10 MG PO TABS: 10.0000 mg | ORAL_TABLET | Freq: Every day | ORAL | 2 refills | Status: DC

## 2020-06-30 MED ORDER — OLANZAPINE-FLUOXETINE HCL 6-50 MG PO CAPS: 1.0000 | ORAL_CAPSULE | Freq: Every day | ORAL | 2 refills | Status: DC

## 2020-06-30 NOTE — Progress Notes (Signed)
Virtual Visit via Telephone Note  I connected with Diana Page on 06/30/20 at  9:40 AM EST by telephone and verified that I am speaking with the correct person using two identifiers.  Location: Patient: Home Provider: Home Office   I discussed the limitations, risks, security and privacy concerns of performing an evaluation and management service by telephone and the availability of in person appointments. I also discussed with the patient that there may be a patient responsible charge related to this service. The patient expressed understanding and agreed to proceed.   History of Present Illness: Patient is evaluated by phone session.  She is on the phone by herself.  She is doing much better on Trintellix and Symbyax.  She had a good trip to [place] and she did visit the family members.  She feel the current combination is working well and she has not taken Xanax in a while.  She lives with her husband who is supportive.  She denies any crying spells, irritability, mania or any psychosis.  She has no tremors or shakes.  She denies any panic attack.  Her appetite is okay and her weight is stable.  Her obsessive thoughts are not worsening and she feels she can handle them without any concerns.  She denies any crying spells.   Past Psychiatric History: H/[place] depression [place]oh/ohallucination, paranoia, suicidal attempt,inpatient treatment. Symbyaxworked.TriedXanaxas needed.Luvox caused sexual side effectsand Wellbutrin did not helped.Marland Kitchen   Psychiatric Specialty Exam: Physical Exam  Review of Systems  Weight 244 lb (110.7 kg).There is no height or weight on file to calculate BMI.  General Appearance: NA  Eye Contact:  NA  Speech:  Clear and Coherent  Volume:  Normal  Mood:  Euthymic  Affect:  NA  Thought Process:  Goal Directed  Orientation:  Full (Time, Place, and Person)  Thought Content:  Logical  Suicidal Thoughts:  No  Homicidal  Thoughts:  No  Memory:  Immediate;   Good Recent;   Good Remote;   Good  Judgement:  Intact  Insight:  Present  Psychomotor Activity:  NA  Concentration:  Concentration: Good and Attention Span: Good  Recall:  Good  Fund of Knowledge:  Good  Language:  Good  Akathisia:  No  Handed:  Right  AIMS (if indicated):     Assets:  Communication Skills Desire for Improvement Housing Resilience Social Support Transportation  ADL's:  Intact  Cognition:  WNL  Sleep:   good      Assessment and Plan: Obsessive-compulsive disorder.  Major depressive disorder, recurrent.  Patient doing better on Trintellix and Symbyax.  She has no issue getting her medication from the pharmacy since it is approved from her insurance.  Discussed medication side effects and benefits.  Continue Symbyax 6/50 once a day and Trintellix 10 mg daily.  She does not need a new prescription of Xanax.  Recommended to call us back if she is any question or any concern.  Follow-up in 3 months.  Follow Up Instructions:    I discussed the assessment and treatment plan with the patient. The patient was provided an opportunity to ask questions and all were answered. The patient agreed with the plan and demonstrated an understanding of the instructions.   The patient was advised to call back or seek an in-person evaluation if the symptoms worsen or if the condition fails to improve as anticipated.  I provided 16 minutes of non-face-to-face time during this encounter.   Kathlee Nations, MD

## 2020-09-07 ENCOUNTER — Other Ambulatory Visit: Payer: Self-pay | Admitting: Gastroenterology

## 2020-09-07 DIAGNOSIS — R112 Nausea with vomiting, unspecified: Secondary | ICD-10-CM

## 2020-09-22 ENCOUNTER — Ambulatory Visit
Admission: RE | Admit: 2020-09-22 | Discharge: 2020-09-22 | Disposition: A | Discharge status: Home / Self Care | Payer: Managed Care, Other (non HMO) | Source: Ambulatory Visit | Attending: Gastroenterology | Admitting: Gastroenterology

## 2020-09-22 DIAGNOSIS — R112 Nausea with vomiting, unspecified: Secondary | ICD-10-CM

## 2020-09-28 ENCOUNTER — Telehealth (INDEPENDENT_AMBULATORY_CARE_PROVIDER_SITE_OTHER): Payer: Managed Care, Other (non HMO) | Admitting: Psychiatry

## 2020-09-28 ENCOUNTER — Encounter (HOSPITAL_COMMUNITY): Payer: Self-pay | Admitting: Psychiatry

## 2020-09-28 ENCOUNTER — Other Ambulatory Visit: Payer: Self-pay

## 2020-09-28 DIAGNOSIS — F330 Major depressive disorder, recurrent, mild: Secondary | ICD-10-CM

## 2020-09-28 DIAGNOSIS — F429 Obsessive-compulsive disorder, unspecified: Secondary | ICD-10-CM | POA: Diagnosis not present

## 2020-09-28 MED ORDER — VORTIOXETINE HBR 10 MG PO TABS: 10.0000 mg | ORAL_TABLET | Freq: Every day | ORAL | 2 refills | Status: DC

## 2020-09-28 MED ORDER — OLANZAPINE-FLUOXETINE HCL 6-50 MG PO CAPS: 1.0000 | ORAL_CAPSULE | Freq: Every day | ORAL | 2 refills | Status: DC

## 2020-09-28 NOTE — Progress Notes (Addendum)
Virtual Visit via Telephone Note  I connected with Skai Fitzgibbon on 09/28/20 at 10:00 AM EDT by telephone and verified that I am speaking with the correct person using two identifiers.  Location: Patient: Home Provider: Home Office   I discussed the limitations, risks, security and privacy concerns of performing an evaluation and management service by telephone and the availability of in person appointments. I also discussed with the patient that there may be a patient responsible charge related to this service. The patient expressed understanding and agreed to proceed.   History of Present Illness: Patient is evaluated by phone session.  She noticed lately lack of motivation, interest to do things.  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  Recently she had acid reflux complaints and seen by GI and had an ultrasound but no abnormal findings.  She is taking Zofran for nausea and a new medicine given for acid reflux which she has not started yet.  Patient admitted that she was doing good with the Trintellix and Symbyax but not sure if Trintellix causing the side effects.  She has not taken Xanax in more than 4 months.  She lives with her husband who is supportive.  She denies any hallucination, mania, anger, suicidal thoughts.  Sometimes she feels hopeless but denies any crying spells.  She endorsed decreased desire to do things and noticed her obsessive thoughts are coming back.  She has no tremors, shakes or any EPS.  Her weight is stable.   Past Psychiatric History: H/[place] depression [place]/oh[place]ucination, paranoia, suicidal attempt,inpatient treatment. Symbyaxworked.TriedXanaxas needed.Luvox caused sexual side effectsand Wellbutrin did not helped.Marland Kitchen  Psychiatric Specialty Exam: Physical Exam  Review of Systems  Weight 244 lb (110.7 kg).There is no height or  weight on file to calculate BMI.  General Appearance: NA  Eye Contact:  NA  Speech:  Clear and Coherent  Volume:  Normal  Mood:  Depressed  Affect:  NA  Thought Process:  Goal Directed  Orientation:  Full (Time, Place, and Person)  Thought Content:  Rumination  Suicidal Thoughts:  No  Homicidal Thoughts:  No  Memory:  Immediate;   Good Recent;   Good Remote;   Good  Judgement:  Intact  Insight:  Present  Psychomotor Activity:  NA  Concentration:  Concentration: Good and Attention Span: Good  Recall:  Good  Fund of Knowledge:  Good  Language:  Good  Akathisia:  No  Handed:  Right  AIMS (if indicated):     Assets:  Communication Skills Desire for Improvement Housing Resilience Social Support Transportation  ADL's:  Intact  Cognition:  WNL  Sleep:   sleep during day      Assessment and Plan: Obsessive-compulsive disorder.  Major depressive disorder, recurrent.  Discuss with symptoms of GI and she recently had a visit with Dr. Geoffry Paradise and had a ultrasound but did not have any major concern.  Patient is going to start the new medicine for acid reflux.  I explained it could be due to the side effects of the Trintellix.  Patient felt better but Trintellix and I recommend to try taking at bedtime to help her sleep cycle and minimize the side effects.  Patient agreed with the plan.  I also recommend if symptoms do not improved by taking the Trintellix at nighttime for side effects discontinued if she decided to discontinue Trintellix then we can try some  different medication.  For now she will continue Trintellix 10 mg daily and Symbyax 6/50 once a day.  She also suggested to seeing the therapist and patient is going to make an appointment with the therapist Ms. Bollinger lives in [place].  Patient is in therapy with her on the phone.  Recommended to call us back if she is any question or any concern.  Follow-up in 3 months unless patient requested an earlier appointment.  Follow Up  Instructions:    I discussed the assessment and treatment plan with the patient. The patient was provided an opportunity to ask questions and all were answered. The patient agreed with the plan and demonstrated an understanding of the instructions.   The patient was advised to call back or seek an in-person evaluation if the symptoms worsen or if the condition fails to improve as anticipated.  I provided 19 minutes of non-face-to-face time during this encounter.   Kathlee Nations, MD

## 2020-10-26 ENCOUNTER — Telehealth: Payer: Self-pay | Admitting: Neurology

## 2020-10-26 ENCOUNTER — Ambulatory Visit: Payer: BC Managed Care – PPO | Admitting: Neurology

## 2020-10-26 NOTE — Telephone Encounter (Signed)
Dr. Jaynee Eagles is out, I lvm for patient letting her know and asked her to please call the office back to reschedule. I also sent her a Pharmacist, community message.

## 2020-11-30 ENCOUNTER — Telehealth (HOSPITAL_COMMUNITY): Payer: 59 | Admitting: Psychiatry

## 2020-12-02 ENCOUNTER — Telehealth (HOSPITAL_COMMUNITY): Payer: 59 | Admitting: Psychiatry

## 2020-12-08 ENCOUNTER — Telehealth (INDEPENDENT_AMBULATORY_CARE_PROVIDER_SITE_OTHER): Payer: Managed Care, Other (non HMO) | Admitting: Psychiatry

## 2020-12-08 ENCOUNTER — Other Ambulatory Visit: Payer: Self-pay

## 2020-12-08 ENCOUNTER — Encounter (HOSPITAL_COMMUNITY): Payer: Self-pay | Admitting: Psychiatry

## 2020-12-08 DIAGNOSIS — F330 Major depressive disorder, recurrent, mild: Secondary | ICD-10-CM

## 2020-12-08 DIAGNOSIS — F429 Obsessive-compulsive disorder, unspecified: Secondary | ICD-10-CM

## 2020-12-08 MED ORDER — VORTIOXETINE HBR 10 MG PO TABS: 10.0000 mg | ORAL_TABLET | Freq: Every day | ORAL | 2 refills | Status: DC

## 2020-12-08 MED ORDER — OLANZAPINE-FLUOXETINE HCL 6-50 MG PO CAPS: 1.0000 | ORAL_CAPSULE | Freq: Every day | ORAL | 2 refills | Status: DC

## 2020-12-08 NOTE — Progress Notes (Signed)
Virtual Visit via Telephone Note  I connected with Diana Page on 12/08/20 at  3:00 PM EDT by telephone and verified that I am speaking with the correct person using two identifiers.  Location: Patient: Home Provider: Home Office   I discussed the limitations, risks, security and privacy concerns of performing an evaluation and management service by telephone and the availability of in person appointments. I also discussed with the patient that there may be a patient responsible charge related to this service. The patient expressed understanding and agreed to proceed.   History of Present Illness: Patient is evaluated by phone session.  She is doing much better on the combination of Trintellix and Symbyax.  She has no GI issues as now taking Protonix for acid reflux.  She is sleeping okay.  She started Weight Watchers program and hoping that help to reduce weight.  She lives with her husband who is supportive.  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  She denies any crying spells, feeling of hopelessness, mania or any psychosis.  Her energy level is good.  Her appetite is okay.  She reported her obsessive thoughts are stable.  She denies any suicidal thoughts or homicidal thoughts.  She like to keep the combination of medicine as it is helping her.  She has no tremor or shakes or any EPS.  Past Psychiatric History:       H/O depression and OCD.  Tried Prozac in [place].  No h/o hallucination, paranoia, suicidal attempt, inpatient treatment.  Symbyax worked. Tried Xanax as needed. Luvox caused sexual side effects and Wellbutrin did not helped.Marland Kitchen     Psychiatric Specialty Exam: Physical Exam  Review of Systems  There were no vitals taken for this visit.There is no height or weight on file to calculate BMI.  General Appearance: NA  Eye Contact:  NA  Speech:  Clear and Coherent  Volume:  Normal  Mood:  Euthymic  Affect:  NA   Thought Process:  Goal Directed  Orientation:  Full (Time, Place, and Person)  Thought Content:  WDL  Suicidal Thoughts:  No  Homicidal Thoughts:  No  Memory:  Immediate;   Good Recent;   Good Remote;   Good  Judgement:  Good  Insight:  Present  Psychomotor Activity:  NA  Concentration:  Concentration: Good and Attention Span: Good  Recall:  Good  Fund of Knowledge:  Good  Language:  Good  Akathisia:  No  Handed:  Right  AIMS (if indicated):     Assets:  Communication Skills Desire for Improvement Housing Resilience Social Support Talents/Skills Transportation  ADL's:  Intact  Cognition:  WNL  Sleep:   ok      Assessment and Plan: Obsessive-compulsive disorder.  Major depressive disorder, recurrent.  Patient is stable on the combination of Symbyax and Trintellix.  She has no worsening of symptoms.  She is tolerating weight loss program and has not checked her weight since she started the program.  She like to keep the current medication.  She has no tremor or shakes or any EPS.  Continue Trintellix 10 mg daily and Symbyax 6/50 mg once a day.  She admitted not have connected to her therapist in a while but like to schedule appointment with her therapist Ms. Bollinger in [place] for follow-up.  Recommended to call us back if she is any question or any concern.  Follow-up in 3 months.  Follow Up  Instructions:    I discussed the assessment and treatment plan with the patient. The patient was provided an opportunity to ask questions and all were answered. The patient agreed with the plan and demonstrated an understanding of the instructions.   The patient was advised to call back or seek an in-person evaluation if the symptoms worsen or if the condition fails to improve as anticipated.  I provided 14 minutes of non-face-to-face time during this encounter.   Kathlee Nations, MD

## 2021-01-14 ENCOUNTER — Other Ambulatory Visit: Payer: Self-pay | Admitting: Neurology

## 2021-02-01 ENCOUNTER — Other Ambulatory Visit: Payer: Self-pay | Admitting: Neurology

## 2021-03-10 ENCOUNTER — Other Ambulatory Visit: Payer: Self-pay

## 2021-03-10 ENCOUNTER — Encounter (HOSPITAL_COMMUNITY): Payer: Self-pay | Admitting: Psychiatry

## 2021-03-10 ENCOUNTER — Telehealth (INDEPENDENT_AMBULATORY_CARE_PROVIDER_SITE_OTHER): Payer: Managed Care, Other (non HMO) | Admitting: Psychiatry

## 2021-03-10 DIAGNOSIS — F429 Obsessive-compulsive disorder, unspecified: Secondary | ICD-10-CM | POA: Diagnosis not present

## 2021-03-10 DIAGNOSIS — F330 Major depressive disorder, recurrent, mild: Secondary | ICD-10-CM

## 2021-03-10 MED ORDER — OLANZAPINE-FLUOXETINE HCL 6-50 MG PO CAPS: 1.0000 | ORAL_CAPSULE | Freq: Every day | ORAL | 0 refills | Status: DC

## 2021-03-10 MED ORDER — VORTIOXETINE HBR 10 MG PO TABS: 10.0000 mg | ORAL_TABLET | Freq: Every day | ORAL | 0 refills | Status: DC

## 2021-03-10 NOTE — Progress Notes (Signed)
Virtual Visit via Telephone Note  I connected with Diana Page on 03/10/21 at  2:40 PM EDT by telephone and verified that I am speaking with the correct person using two identifiers.  Location: Patient: Home Provider: Home Office   I discussed the limitations, risks, security and privacy concerns of performing an evaluation and management service by telephone and the availability of in person appointments. I also discussed with the patient that there may be a patient responsible charge related to this service. The patient expressed understanding and agreed to proceed.   History of Present Illness: Patient is evaluated by phone session.  She admitted some anxiety because they are about to close the house in October.  Patient and her husband bought a property in [place] and they are in the process of moving.  Patient husband works from home and they can choose wherever they want to live.  Patient like [place].  Overall she feels things are going very well but she has to find a new psychiatrist.  She lost 20 pounds in past few months by doing weight watcher.  She reported her OCD anxiety and depression is a stable.  She denies any crying spells or any feeling of hopelessness.  She is sleeping good.  She has no tremors, shakes or any EPS.  She is not in therapy anymore as therapist from [place] does not take patient out of state.  She is going to see if she can find a new therapist in [place].   Past Psychiatric History:       H/O depression and OCD.  Tried Prozac in [place].  No h/o hallucination, paranoia, suicidal attempt, inpatient treatment.  Symbyax worked. Tried Xanax as needed. Luvox caused sexual side effects and Wellbutrin did not helped.Marland Kitchen      Psychiatric Specialty Exam: Physical Exam  Review of Systems  There were no vitals taken for this visit.There is no height or weight on file to calculate BMI.  General Appearance: NA  Eye Contact:  NA  Speech:  Clear and Coherent   Volume:  Normal  Mood:  Euthymic  Affect:  NA  Thought Process:  Goal Directed  Orientation:  Full (Time, Place, and Person)  Thought Content:  Logical  Suicidal Thoughts:  No  Homicidal Thoughts:  No  Memory:  Immediate;   Good Recent;   Good Remote;   Good  Judgement:  Good  Insight:  Present  Psychomotor Activity:  NA  Concentration:  Concentration: Good and Attention Span: Good  Recall:  Good  Fund of Knowledge:  Good  Language:  Good  Akathisia:  No  Handed:  Right  AIMS (if indicated):     Assets:  Communication Skills Desire for Improvement Housing Resilience  ADL's:  Intact  Cognition:  WNL  Sleep:   ok      Assessment and Plan: Obsessive-compulsive disorder.  Major depressive disorder, recurrent.  Patient lost weight since doing the weight watcher.  She is feeling good.  She is in the process of moving to [place].  She like to continue her current medication which is Trintellix 10 mg daily and Symbyax 6/50 mg once a day.  Discussed medication side effects and benefits.  Patient is in a process of finding a new psychiatrist in [place] however like to keep the appointment in 3 months in case she needed refills.  Discussed medication side effects and benefits.  Recommended to call us back if she has any question, concern or if she feels  worsening of the symptoms.  Follow Up Instructions:    I discussed the assessment and treatment plan with the patient. The patient was provided an opportunity to ask questions and all were answered. The patient agreed with the plan and demonstrated an understanding of the instructions.   The patient was advised to call back or seek an in-person evaluation if the symptoms worsen or if the condition fails to improve as anticipated.  I provided 22 minutes of non-face-to-face time during this encounter.   Kathlee Nations, MD

## 2021-03-24 ENCOUNTER — Ambulatory Visit: Payer: Managed Care, Other (non HMO) | Admitting: Nurse Practitioner

## 2021-03-24 ENCOUNTER — Other Ambulatory Visit: Payer: Self-pay

## 2021-03-24 ENCOUNTER — Encounter: Payer: Self-pay | Admitting: Nurse Practitioner

## 2021-03-24 VITALS — BP 114/72 | HR 73 | Temp 98.4°F | Ht 62.4 in | Wt 223.0 lb

## 2021-03-24 DIAGNOSIS — M25511 Pain in right shoulder: Secondary | ICD-10-CM

## 2021-03-24 DIAGNOSIS — K51818 Other ulcerative colitis with other complication: Secondary | ICD-10-CM

## 2021-03-24 DIAGNOSIS — Z7689 Persons encountering health services in other specified circumstances: Secondary | ICD-10-CM

## 2021-03-24 DIAGNOSIS — Z13228 Encounter for screening for other metabolic disorders: Secondary | ICD-10-CM

## 2021-03-24 DIAGNOSIS — Z9049 Acquired absence of other specified parts of digestive tract: Secondary | ICD-10-CM | POA: Insufficient documentation

## 2021-03-24 DIAGNOSIS — Z932 Ileostomy status: Secondary | ICD-10-CM | POA: Diagnosis not present

## 2021-03-24 DIAGNOSIS — Z6841 Body Mass Index (BMI) 40.0 and over, adult: Secondary | ICD-10-CM

## 2021-03-24 MED ORDER — TRIAMCINOLONE ACETONIDE 40 MG/ML IJ SUSP
60.0000 mg | Freq: Once | INTRAMUSCULAR | Status: AC
  Administered 2021-03-24: 60 mg via INTRAMUSCULAR

## 2021-03-24 NOTE — Patient Instructions (Signed)
Shoulder Pain °Many things can cause shoulder pain, including: °An injury to the shoulder. °Overuse of the shoulder. °Arthritis. °The source of the pain can be: °Inflammation. °An injury to the shoulder joint. °An injury to a tendon, ligament, or bone. °Follow these instructions at home: °Pay attention to changes in your symptoms. Let your health care provider know about them. Follow these instructions to relieve your pain. °If you have a sling: °Wear the sling as told by your health care provider. Remove it only as told by your health care provider. °Loosen the sling if your fingers tingle, become numb, or turn cold and blue. °Keep the sling clean. °If the sling is not waterproof: °Do not let it get wet. Remove it to shower or bathe. °Move your arm as little as possible, but keep your hand moving to prevent swelling. °Managing pain, stiffness, and swelling ° °If directed, put ice on the painful area: °Put ice in a plastic bag. °Place a towel between your skin and the bag. °Leave the ice on for 20 minutes, 2-3 times per day. Stop applying ice if it does not help with the pain. °Squeeze a soft ball or a foam pad as much as possible. This helps to keep the shoulder from swelling. It also helps to strengthen the arm. °General instructions °Take over-the-counter and prescription medicines only as told by your health care provider. °Keep all follow-up visits as told by your health care provider. This is important. °Contact a health care provider if: °Your pain gets worse. °Your pain is not relieved with medicines. °New pain develops in your arm, hand, or fingers. °Get help right away if: °Your arm, hand, or fingers: °Tingle. °Become numb. °Become swollen. °Become painful. °Turn white or blue. °Summary °Shoulder pain can be caused by an injury, overuse, or arthritis. °Pay attention to changes in your symptoms. Let your health care provider know about them. °This condition may be treated with a sling, ice, and pain  medicines. °Contact your health care provider if the pain gets worse or new pain develops. Get help right away if your arm, hand, or fingers tingle or become numb, swollen, or painful. °Keep all follow-up visits as told by your health care provider. This is important. °This information is not intended to replace advice given to you by your health care provider. Make sure you discuss any questions you have with your health care provider. °Document Revised: 12/26/2017 Document Reviewed: 12/26/2017 °Elsevier Patient Education © 2022 [employer] Inc. ° °

## 2021-03-24 NOTE — Progress Notes (Signed)
I,Katawbba Wiggins,acting as a Education administrator for [employer], FNP.,have documented all relevant documentation on the behalf of Minette Brine, FNP,as directed by  Minette Brine, FNP while in the presence of Minette Brine, [place].   This visit occurred during the SARS-CoV-2 public health emergency.  Safety protocols were in place, including screening questions prior to the visit, additional usage of staff PPE, and extensive cleaning of exam room while observing appropriate contact time as indicated for disinfecting solutions.  Subjective:     Patient ID: Diana Page , female    DOB: January 24, 1991 , 30 y.o.   MRN: [id number]   Chief Complaint  Patient presents with   Establish Care   Shoulder Pain    right    HPI  The patient is here today to establish care, found [place] online. She is followed by Dr. Benson [place] - ulcerative colitis and has an ileostomy was diagnosed at age 66. 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 She is married. No children. She has a Water quality scientist in [place] planning to apply for her Master's degree. She is recently established with Dr. Pamala Hurry.   [facility] Father Chron's Brother diverticulitis Sister UC. Maternal grandmother multiple myeloma.  for evaluation of right shoulder pain. She is unable to sleep on her right side sleeps on her back due to her ileostomy.   Shoulder Pain  The pain is present in the right shoulder. This is a new problem. The current episode started 1 to 4 weeks ago. There has been no history of extremity trauma. The problem occurs constantly. The problem has been unchanged. The quality of the pain is described as aching and sharp. The pain is at a severity of 6/10. The pain is moderate. Pertinent negatives include no fever, inability to bear weight or numbness. Associated symptoms comments: She is not using the right arm as much. Exacerbated by: laying on the right shoulder and lifting from an arm chair. She has tried nothing for the symptoms. There is no  history of diabetes or osteoarthritis.    Past Medical History:  Diagnosis Date   Anemia    Anxiety    Back pain    Depression    Fatty liver    GERD (gastroesophageal reflux disease)    Headache 01/04/2015   Hx of cholecystectomy    Hypertension    Intercranial Hypertension   IBS (irritable bowel syndrome)    Joint pain    Obsessive compulsive disorder 11/2012   Ostomy nurse consultation    Pancreatitis    hx of    UC (ulcerative colitis) ([place])      Family History  Problem Relation Age of Onset   OCD Mother    Diabetes Mother    Depression Mother    Anxiety disorder Mother    Obesity Mother    Obesity Father    OCD Sister    Depression Sister    OCD Brother    Anxiety disorder Brother      Current Outpatient Medications:    acetaminophen (TYLENOL) 500 MG tablet, Take 500-1,000 mg by mouth every 6 (six) hours as needed (for pain)., Disp: , Rfl:    acetaZOLAMIDE ER (DIAMOX) 500 MG capsule, TAKE 1 CAPSULE BY MOUTH THREE TIMES A DAY, Disp: 270 capsule, Rfl: 0   ALPRAZolam (XANAX) 0.5 MG tablet, as needed., Disp: , Rfl:    Incontinence Supplies (DRAINABLE FECAL COLLECTOR) MISC, by Does not apply route., Disp: , Rfl:    OLANZapine-FLUoxetine (SYMBYAX) 6-50 MG capsule, Take 1  capsule by mouth daily., Disp: 90 capsule, Rfl: 0   ondansetron (ZOFRAN-ODT) 4 MG disintegrating tablet, Take 4 mg by mouth 3 (three) times daily., Disp: , Rfl:    Ostomy Supplies MISC, by Does not apply route., Disp: , Rfl:    vortioxetine HBr (TRINTELLIX) 10 MG TABS tablet, Take 1 tablet (10 mg total) by mouth daily., Disp: 90 tablet, Rfl: 0   Allergies  Allergen Reactions   Nsaids Other (See Comments)    Cannot have because of her Ulcerative Colitis   Tolmetin Other (See Comments)    Cannot have because of her Ulcerative Colitis     Review of Systems  Constitutional: Negative.  Negative for fever.  Respiratory: Negative.    Cardiovascular: Negative.   Musculoskeletal:  Positive for  arthralgias (right shoulder pain).  Neurological:  Negative for dizziness and numbness.  Psychiatric/Behavioral: Negative.      Today's Vitals   03/24/21 0946  BP: 114/72  Pulse: 73  Temp: 98.4 F (36.9 C)  Weight: 223 lb (101.2 kg)  Height: 5' 2.4" (1.585 m)   Body mass index is 40.27 kg/m.  Wt Readings from Last 3 Encounters:  03/24/21 223 lb (101.2 kg)  01/23/20 (!) 244 lb (110.7 kg)  10/24/19 245 lb (111.1 kg)    BP Readings from Last 3 Encounters:  03/24/21 114/72  02/24/20 128/75  01/23/20 123/79    Objective:  Physical Exam Vitals reviewed.  Constitutional:      General: She is not in acute distress.    Appearance: Normal appearance. She is obese.  Cardiovascular:     Rate and Rhythm: Normal rate and regular rhythm.     Pulses: Normal pulses.     Heart sounds: Normal heart sounds. No murmur heard. Pulmonary:     Effort: Pulmonary effort is normal. No respiratory distress.     Breath sounds: Normal breath sounds. No wheezing.  Musculoskeletal:        General: Tenderness (right anterior acromion space, pain with extension) present.     Comments: Mild tenderness with internal flexion  Skin:    General: Skin is warm and dry.  Neurological:     General: No focal deficit present.     Mental Status: She is alert and oriented to person, place, and time.     Cranial Nerves: No cranial nerve deficit.     Motor: No weakness.  Psychiatric:        Mood and Affect: Mood normal.        Behavior: Behavior normal.        Thought Content: Thought content normal.        Judgment: Judgment normal.        Assessment And Plan:     1. Acute pain of right shoulder Comments: Tenderness to anterior bursa space Kenalog IM and she is to get a shoulder xray - DG Shoulder Right; Future - triamcinolone acetonide (KENALOG-40) injection 60 mg  2. Class 3 severe obesity with body mass index (BMI) of 40.0 to 44.9 in adult, unspecified obesity type, unspecified whether serious  comorbidity present ([place]) Comments: Encouraged to exercise regularly and eat healthy diet  3. Other ulcerative colitis with other complication ([place]) Comments: Continue follow up with GI  4. Ileostomy status ([place])  5. Encounter for screening for metabolic disorder  6. Encounter to establish care    Patient was given opportunity to ask questions. Patient verbalized understanding of the plan and was able to repeat key elements of the plan. All  questions were answered to their satisfaction.  Minette Brine, FNP   I, Minette Brine, FNP, have reviewed all documentation for this visit. The documentation on 03/25/21 for the exam, diagnosis, procedures, and orders are all accurate and complete.   IF YOU HAVE BEEN REFERRED TO A SPECIALIST, IT MAY TAKE 1-2 WEEKS TO SCHEDULE/PROCESS THE REFERRAL. IF YOU HAVE NOT HEARD FROM US/SPECIALIST IN TWO WEEKS, PLEASE GIVE [place] A CALL AT [phone].   THE PATIENT IS ENCOURAGED TO PRACTICE SOCIAL DISTANCING DUE TO THE COVID-19 PANDEMIC.

## 2021-04-22 ENCOUNTER — Encounter (HOSPITAL_COMMUNITY): Payer: Self-pay | Admitting: Psychiatry

## 2021-04-22 ENCOUNTER — Other Ambulatory Visit: Payer: Self-pay

## 2021-04-22 ENCOUNTER — Ambulatory Visit (HOSPITAL_BASED_OUTPATIENT_CLINIC_OR_DEPARTMENT_OTHER): Payer: Managed Care, Other (non HMO) | Admitting: Psychiatry

## 2021-04-22 DIAGNOSIS — F429 Obsessive-compulsive disorder, unspecified: Secondary | ICD-10-CM

## 2021-04-22 DIAGNOSIS — F330 Major depressive disorder, recurrent, mild: Secondary | ICD-10-CM | POA: Diagnosis not present

## 2021-04-22 MED ORDER — OLANZAPINE-FLUOXETINE HCL 6-50 MG PO CAPS: 1.0000 | ORAL_CAPSULE | Freq: Every day | ORAL | 0 refills | Status: DC

## 2021-04-22 MED ORDER — VORTIOXETINE HBR 10 MG PO TABS: 10.0000 mg | ORAL_TABLET | Freq: Every day | ORAL | 0 refills | Status: DC

## 2021-04-22 NOTE — Progress Notes (Signed)
Virtual Visit via Telephone Note  I connected with Diana Page on 04/22/21 at  3:40 PM EDT by telephone and verified that I am speaking with the correct person using two identifiers.  Location: Patient: Home Provider: Office   I discussed the limitations, risks, security and privacy concerns of performing an evaluation and management service by telephone and the availability of in person appointments. I also discussed with the patient that there may be a patient responsible charge related to this service. The patient expressed understanding and agreed to proceed.   History of Present Illness: Patient is evaluated by phone session.  She requested an earlier appointment because her plan to wean continue was postponed.  Patient told the property they were looking to buy had a lot of work and they decided not to pursue further.  Patient also started graduate program at [employer] in [place].  She is excited about the program and mental she finished the program they have decided to stay in this area.  Recently her OB/GYN started her on phentermine for weight loss and she noticed improvement in her energy, focus, and she is sleeping better.  She also using weight watcher and able to lost 2 pounds since she started phentermine.  She also looking for a new therapist to help her OCD symptoms which sometimes she feels not stable.  She still have obsession about counting and checking but they are chronic symptoms and comes and goes.  She had appointment coming up next week.  She denies any paranoia, hallucination, suicidal thoughts.  She denies any mania or any panic attack.  Patient lives with her husband.  She likes to keep the current medication since they are helping.  Patient denies any tremors, shakes or any EPS.  She denies any insomnia.  Past Psychiatric History:       H/O depression and OCD.  Tried Prozac in [place].  No h/o hallucination, paranoia, suicidal attempt, inpatient  treatment.  Symbyax worked. Tried Xanax as needed. Luvox caused sexual side effects and Wellbutrin did not helped.Marland Kitchen     Psychiatric Specialty Exam: Physical Exam  Review of Systems  Weight 222 lb (100.7 kg).There is no height or weight on file to calculate BMI.  General Appearance: NA  Eye Contact:  NA  Speech:  Normal Rate  Volume:  Normal  Mood:  Euthymic  Affect:  NA  Thought Process:  Goal Directed  Orientation:  Full (Time, Place, and Person)  Thought Content:  Obsessions and checking and counting  Suicidal Thoughts:  No  Homicidal Thoughts:  No  Memory:  Immediate;   Good Recent;   Good Remote;   Good  Judgement:  Intact  Insight:  Present  Psychomotor Activity:  NA  Concentration:  Concentration: Good and Attention Span: Good  Recall:  Good  Fund of Knowledge:  Good  Language:  Good  Akathisia:  No  Handed:  Right  AIMS (if indicated):     Assets:  Communication Skills Desire for Improvement Housing Resilience Transportation  ADL's:  Intact  Cognition:  WNL  Sleep:   ok     Assessment and Plan: Obsessive-compulsive disorder.  Major depressive disorder, recurrent.  Patient and her husband decided to stay in this area for now.  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  She like to keep the current medication since Trintellix and Symbyax helping her OCD and major depressive disorder.  Patient is in a process of evaluation by a  new therapist to rule out if she has ADHD.  But she had felt better with the phentermine and her attention, focus.  Patient is aware that phentermine is only for 12 weeks and she need to be follow-up with the PCP for labs.  So far patient reported no side effects from the medication.  Continue Trintellix 10 mg daily and Symbyax 6/50 once a day.  Recommended to call us back if she is any question or any concern.  Follow-up in 3 months.  Patient going to [place] during holidays and she is sad about it.  Follow Up  Instructions:    I discussed the assessment and treatment plan with the patient. The patient was provided an opportunity to ask questions and all were answered. The patient agreed with the plan and demonstrated an understanding of the instructions.   The patient was advised to call back or seek an in-person evaluation if the symptoms worsen or if the condition fails to improve as anticipated.  I provided 22 minutes of non-face-to-face time during this encounter.   Kathlee Nations, MD

## 2021-04-27 ENCOUNTER — Other Ambulatory Visit: Payer: Self-pay

## 2021-04-27 ENCOUNTER — Ambulatory Visit
Admission: RE | Admit: 2021-04-27 | Discharge: 2021-04-27 | Disposition: A | Discharge status: Home / Self Care | Payer: Managed Care, Other (non HMO) | Source: Ambulatory Visit | Attending: Nurse Practitioner | Admitting: Nurse Practitioner

## 2021-04-27 DIAGNOSIS — M25511 Pain in right shoulder: Secondary | ICD-10-CM

## 2021-04-28 ENCOUNTER — Encounter: Payer: Self-pay | Admitting: *Deleted

## 2021-05-05 ENCOUNTER — Ambulatory Visit: Payer: Managed Care, Other (non HMO) | Admitting: Neurology

## 2021-05-08 ENCOUNTER — Other Ambulatory Visit: Payer: Self-pay | Admitting: Neurology

## 2021-05-12 ENCOUNTER — Ambulatory Visit: Payer: Managed Care, Other (non HMO) | Admitting: Neurology

## 2021-05-28 ENCOUNTER — Other Ambulatory Visit: Payer: Self-pay | Admitting: Neurology

## 2021-05-31 NOTE — Telephone Encounter (Signed)
Pt was called. LVM for pt to call back and schedule appt.

## 2021-06-26 ENCOUNTER — Encounter (HOSPITAL_COMMUNITY): Payer: Self-pay | Admitting: Emergency Medicine

## 2021-06-26 ENCOUNTER — Emergency Department (HOSPITAL_COMMUNITY)
Admission: EM | Admit: 2021-06-26 | Discharge: 2021-06-26 | Disposition: A | Discharge status: Home / Self Care | Payer: Managed Care, Other (non HMO) | Source: Home / Self Care | Attending: Emergency Medicine | Admitting: Emergency Medicine

## 2021-06-26 ENCOUNTER — Emergency Department (HOSPITAL_COMMUNITY): Payer: Managed Care, Other (non HMO)

## 2021-06-26 DIAGNOSIS — R1012 Left upper quadrant pain: Secondary | ICD-10-CM | POA: Insufficient documentation

## 2021-06-26 DIAGNOSIS — I10 Essential (primary) hypertension: Secondary | ICD-10-CM | POA: Insufficient documentation

## 2021-06-26 DIAGNOSIS — R1010 Upper abdominal pain, unspecified: Secondary | ICD-10-CM

## 2021-06-26 DIAGNOSIS — R1011 Right upper quadrant pain: Secondary | ICD-10-CM | POA: Insufficient documentation

## 2021-06-26 DIAGNOSIS — R112 Nausea with vomiting, unspecified: Secondary | ICD-10-CM | POA: Insufficient documentation

## 2021-06-26 DIAGNOSIS — R1013 Epigastric pain: Secondary | ICD-10-CM | POA: Insufficient documentation

## 2021-06-26 DIAGNOSIS — K859 Acute pancreatitis without necrosis or infection, unspecified: Secondary | ICD-10-CM | POA: Diagnosis not present

## 2021-06-26 DIAGNOSIS — F17290 Nicotine dependence, other tobacco product, uncomplicated: Secondary | ICD-10-CM | POA: Insufficient documentation

## 2021-06-26 DIAGNOSIS — Z79899 Other long term (current) drug therapy: Secondary | ICD-10-CM | POA: Insufficient documentation

## 2021-06-26 LAB — URINALYSIS, ROUTINE W REFLEX MICROSCOPIC
Bilirubin Urine: NEGATIVE
Glucose, UA: NEGATIVE mg/dL
Hgb urine dipstick: NEGATIVE
Ketones, ur: NEGATIVE mg/dL
Leukocytes,Ua: NEGATIVE
Nitrite: NEGATIVE
Protein, ur: NEGATIVE mg/dL
Specific Gravity, Urine: 1.005 (ref 1.005–1.030)
pH: 5 (ref 5.0–8.0)

## 2021-06-26 LAB — CBC
HCT: 39.8 % (ref 36.0–46.0)
Hemoglobin: 12.3 g/dL (ref 12.0–15.0)
MCH: 25.6 pg — ABNORMAL LOW (ref 26.0–34.0)
MCHC: 30.9 g/dL (ref 30.0–36.0)
MCV: 82.7 fL (ref 80.0–100.0)
Platelets: 461 10*3/uL — ABNORMAL HIGH (ref 150–400)
RBC: 4.81 MIL/uL (ref 3.87–5.11)
RDW: 13.5 % (ref 11.5–15.5)
WBC: 13.5 10*3/uL — ABNORMAL HIGH (ref 4.0–10.5)
nRBC: 0 % (ref 0.0–0.2)

## 2021-06-26 LAB — I-STAT BETA HCG BLOOD, ED (MC, WL, AP ONLY): I-stat hCG, quantitative: <5 m[IU]/mL (ref <5)

## 2021-06-26 LAB — COMPREHENSIVE METABOLIC PANEL
ALT: 20 U/L (ref 0–44)
AST: 15 U/L (ref 15–41)
Albumin: 3.8 g/dL (ref 3.5–5.0)
Alkaline Phosphatase: 187 U/L — ABNORMAL HIGH (ref 38–126)
Anion gap: 8 (ref 5–15)
BUN: 12 mg/dL (ref 6–20)
CO2: 19 mmol/L — ABNORMAL LOW (ref 22–32)
Calcium: 8.8 mg/dL — ABNORMAL LOW (ref 8.9–10.3)
Chloride: 108 mmol/L (ref 98–111)
Creatinine, Ser: 1.07 mg/dL — ABNORMAL HIGH (ref 0.44–1.00)
GFR, Estimated: >60 mL/min (ref >60)
Glucose, Bld: 149 mg/dL — ABNORMAL HIGH (ref 70–99)
Potassium: 3.6 mmol/L (ref 3.5–5.1)
Sodium: 135 mmol/L (ref 135–145)
Total Bilirubin: 0.4 mg/dL (ref 0.3–1.2)
Total Protein: 7.6 g/dL (ref 6.5–8.1)

## 2021-06-26 LAB — LIPASE, BLOOD: Lipase: 288 U/L — ABNORMAL HIGH (ref 11–51)

## 2021-06-26 MED ORDER — MORPHINE SULFATE (PF) 4 MG/ML IV SOLN
4.0000 mg | Freq: Once | INTRAVENOUS | Status: AC
  Administered 2021-06-26: 4 mg via INTRAVENOUS
  Filled 2021-06-26: qty 1

## 2021-06-26 MED ORDER — ONDANSETRON HCL 4 MG/2ML IJ SOLN
4.0000 mg | Freq: Once | INTRAMUSCULAR | Status: AC
  Administered 2021-06-26: 4 mg via INTRAVENOUS
  Filled 2021-06-26: qty 2

## 2021-06-26 MED ORDER — IOHEXOL 300 MG/ML  SOLN
80.0000 mL | Freq: Once | INTRAMUSCULAR | Status: AC | PRN
  Administered 2021-06-26: 80 mL via INTRAVENOUS

## 2021-06-26 MED ORDER — LACTATED RINGERS IV BOLUS
1000.0000 mL | Freq: Once | INTRAVENOUS | Status: AC
  Administered 2021-06-26: 1000 mL via INTRAVENOUS

## 2021-06-26 MED ORDER — FENTANYL CITRATE PF 50 MCG/ML IJ SOSY
50.0000 ug | PREFILLED_SYRINGE | Freq: Once | INTRAMUSCULAR | Status: AC
  Administered 2021-06-26: 50 ug via INTRAVENOUS
  Filled 2021-06-26: qty 1

## 2021-06-26 NOTE — ED Provider Notes (Signed)
Emergency Medicine Provider Triage Evaluation Note  Diana Page , a 30 y.o. female  was evaluated in triage.  Pt complains of abd pain, nv and loose stool. Hx ulcerative colitis s/p ostomy  Review of Systems  Positive: Abd pain, nv, diarrhea Negative: fever  Physical Exam  BP (!) 133/92    Pulse (!) 123    Temp 98.4 F (36.9 C) (Oral)    Resp 17    Wt 98.9 kg    LMP 06/18/2021    SpO2 94%    BMI 39.36 kg/m  Gen:   Awake, no distress   Resp:  Normal effort  MSK:   Moves extremities without difficulty  Other:  Mild upper abd ttp  Medical Decision Making  Medically screening exam initiated at 1:58 AM.  Appropriate orders placed.  Diana Page was informed that the remainder of the evaluation will be completed by another provider, this initial triage assessment does not replace that evaluation, and the importance of remaining in the ED until their evaluation is complete.     Rodney Booze, PA-C 06/26/21 0200    Veryl Speak, MD 06/26/21 [phone]

## 2021-06-26 NOTE — ED Notes (Signed)
CT called informed pt has IV

## 2021-06-26 NOTE — ED Provider Notes (Signed)
[facility] Provider Note   CSN: [id number] Arrival date & time: 06/26/21  0111     History Chief Complaint  Patient presents with   Abdominal Pain    Diana Page is a 30 y.o. female with history of UC s/p ileostomy presents to the ED for evaluation of upper abdominal pain off and on for the past 3 days with nausea and 2 episodes of vomiting after eating sushi.  She denies any hematemesis, bloody ostomy output, dysuria, hematuria, fever, chest pain, or SOB. Denies any recent URI symptoms. Patient reports history of pancreatitis in the past. Medical history as stated. Surgical history of ileostomy and cholecystectomy. No daily medications. Does not take NSAIDs. Allergic to Tolmetin. Denies any tobacco, EtOH, or illicit drug use.    Abdominal Pain Associated symptoms: nausea and vomiting   Associated symptoms: no chest pain, no chills, no cough, no diarrhea, no dysuria, no fever, no hematuria, no shortness of breath and no sore throat       Past Medical History:  Diagnosis Date   Anemia    Anxiety    Back pain    Depression    Fatty liver    GERD (gastroesophageal reflux disease)    Headache 01/04/2015   Hx of cholecystectomy    Hypertension    Intercranial Hypertension   IBS (irritable bowel syndrome)    Joint pain    Obsessive compulsive disorder 11/2012   Ostomy nurse consultation    Pancreatitis    hx of    UC (ulcerative colitis) ([place])     Patient Active Problem List   Diagnosis Date Noted   History of cholecystectomy 03/24/2021   Migraine without aura and without status migrainosus, not intractable 11/07/2018   Ileostomy care (HCC)    Nausea vomiting and diarrhea    Depression 06/29/2018   Anxiety 06/29/2018   GERD (gastroesophageal reflux disease) 06/29/2018   Hyponatremia 06/29/2018   Emesis 06/28/2018   Obesity 03/21/2016   IIH (idiopathic intracranial hypertension) 04/02/2015   Papilledema 01/04/2015    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   Intracranial hypertension 01/04/2015   Obsessive compulsive disorder    Ulcerative colitis ([place]) 07/18/2014    Past Surgical History:  Procedure Laterality Date   CHOLECYSTECTOMY     LAPAROSCOPIC PARTIAL PROTECTOMY     PERMANENT ILEOSTOMY     TOTAL COLECTOMY N/A      OB History     Gravida  0   Para  0   Term  0   Preterm  0   AB  0   Living  0      SAB  0   IAB  0   Ectopic  0   Multiple  0   Live Births  0           Family History  Problem Relation Age of Onset   OCD Mother    Diabetes Mother    Depression Mother    Anxiety disorder Mother    Obesity Mother    Obesity Father    OCD Sister    Depression Sister    OCD Brother    Anxiety disorder Brother     Social History   Tobacco Use   Smoking status: Former    Types: Cigarettes, E-cigarettes    Quit date: 06/27/2009    Years since quitting: 12.0   Smokeless tobacco: Never  Vaping Use   Vaping Use: Former  Substance Use Topics   Alcohol use:  Never    Comment: occasionally, "maybe once or twice a year"   Drug use: No    Home Medications Prior to Admission medications   Medication Sig Start Date End Date Taking? Authorizing Provider  acetaminophen (TYLENOL) 500 MG tablet Take 500-1,000 mg by mouth every 6 (six) hours as needed (for pain).    [provider]  acetaZOLAMIDE ER (DIAMOX) 500 MG capsule TAKE 1 CAPSULE BY MOUTH THREE TIMES A DAY 05/10/21   [name], MD  ALPRAZolam Duanne Moron) 0.5 MG tablet as needed. Patient not taking: Reported on 04/22/2021 05/09/19   [provider]  Incontinence Supplies ([place]) [place] by Does not apply route. 09/06/14   [provider]  OLANZapine-FLUoxetine (SYMBYAX) 6-50 MG capsule Take 1 capsule by mouth daily. 04/22/21   Arfeen, Arlyce Harman, MD  ondansetron (ZOFRAN-ODT) 4 MG disintegrating tablet Take 4 mg by mouth 3 (three) times daily. 09/07/20   [provider]   Ostomy Supplies MISC by Does not apply route. 09/06/14   [provider]  phentermine (ADIPEX-P) 37.5 MG tablet Take 37.5 mg by mouth daily. 03/31/21   [provider]  vortioxetine HBr (TRINTELLIX) 10 MG TABS tablet Take 1 tablet (10 mg total) by mouth daily. 04/22/21   Arfeen, Arlyce Harman, MD    Allergies    Nsaids and Tolmetin  Review of Systems   Review of Systems  Constitutional:  Negative for chills and fever.  HENT:  Negative for ear pain and sore throat.   Eyes:  Negative for pain and visual disturbance.  Respiratory:  Negative for cough and shortness of breath.   Cardiovascular:  Negative for chest pain and palpitations.  Gastrointestinal:  Positive for abdominal pain, nausea and vomiting. Negative for blood in stool and diarrhea.  Genitourinary:  Negative for dysuria and hematuria.  Musculoskeletal:  Negative for arthralgias and back pain.  Skin:  Negative for color change and rash.  Neurological:  Negative for seizures and syncope.  All other systems reviewed and are negative.  Physical Exam Updated Vital Signs BP 108/79    Pulse 81    Temp 97.7 F (36.5 C) (Oral)    Resp 11    Wt 98.9 kg    LMP 06/18/2021    SpO2 100%    BMI 39.36 kg/m   Physical Exam Constitutional:      General: She is not in acute distress.    Appearance: Normal appearance. She is not toxic-appearing.  HENT:     Head: Normocephalic and atraumatic.     Mouth/Throat:     Mouth: Mucous membranes are moist.  Eyes:     General: No scleral icterus. Cardiovascular:     Rate and Rhythm: Normal rate and regular rhythm.  Pulmonary:     Effort: Pulmonary effort is normal.     Breath sounds: Normal breath sounds.  Abdominal:     General: Abdomen is flat. Bowel sounds are normal.     Palpations: Abdomen is soft.     Tenderness: There is abdominal tenderness in the right upper quadrant, epigastric area and left upper quadrant. There is no guarding or rebound.     Comments: Ileostomy bag in  the right periumbilicus area. Intact bag. No blood visualized in output. No surrounding erythema or induration. Generalized upper abdominal tenderness, but mainly in the epigastric area. No guarding or rebound. NBS in upper abdomen. No other overlying skin change, rash, erythema, ecchymosis, or abrasion noted to the area.   Musculoskeletal:  General: No deformity.     Cervical back: Normal range of motion.  Skin:    General: Skin is warm and dry.  Neurological:     General: No focal deficit present.     Mental Status: She is alert. Mental status is at baseline.    ED Results / Procedures / Treatments   Labs (all labs ordered are listed, but only abnormal results are displayed) Labs Reviewed  LIPASE, BLOOD - Abnormal; Notable for the following components:      Result Value   Lipase 288 (*)    All other components within normal limits  COMPREHENSIVE METABOLIC PANEL - Abnormal; Notable for the following components:   CO2 19 (*)    Glucose, Bld 149 (*)    Creatinine, Ser 1.07 (*)    Calcium 8.8 (*)    Alkaline Phosphatase 187 (*)    All other components within normal limits  CBC - Abnormal; Notable for the following components:   WBC 13.5 (*)    MCH 25.6 (*)    Platelets 461 (*)    All other components within normal limits  URINALYSIS, ROUTINE W REFLEX MICROSCOPIC - Abnormal; Notable for the following components:   APPearance HAZY (*)    Bacteria, UA RARE (*)    All other components within normal limits  I-STAT BETA HCG BLOOD, ED ([facility], [facility], [facility] ONLY)    EKG None  Radiology CT ABDOMEN PELVIS W CONTRAST  Result Date: 06/26/2021 CLINICAL DATA:  Acute epigastric abdominal pain. EXAM: CT ABDOMEN AND PELVIS WITH CONTRAST TECHNIQUE: Multidetector CT imaging of the abdomen and pelvis was performed using the standard protocol following bolus administration of intravenous contrast. CONTRAST:  83m OMNIPAQUE IOHEXOL 300 MG/ML  SOLN COMPARISON:  June 28, 2018. FINDINGS: Lower chest:  No acute abnormality. Hepatobiliary: Status post cholecystectomy. No biliary dilatation is noted. No focal hepatic abnormality is noted. Hepatomegaly is again noted. Pancreas: Unremarkable. No pancreatic ductal dilatation or surrounding inflammatory changes. Spleen: Stable mild splenomegaly. Adrenals/Urinary Tract: Adrenal glands are unremarkable. Kidneys are normal, without renal calculi, focal lesion, or hydronephrosis. Bladder is unremarkable. Stomach/Bowel: The stomach appears normal. Status post colectomy. Ileostomy is noted in right lower quadrant. No evidence of bowel obstruction or inflammation is noted. Vascular/Lymphatic: No significant vascular findings are present. No enlarged abdominal or pelvic lymph nodes. Reproductive: Uterus and bilateral adnexa are unremarkable. Other: No abdominal wall hernia or abnormality. No abdominopelvic ascites. Musculoskeletal: No acute or significant osseous findings. IMPRESSION: No acute abnormality seen in the abdomen or pelvis. Electronically Signed   By: JMarijo ConceptionM.D.   On: 06/26/2021 11:44     Procedures Procedures   Medications Ordered in ED Medications  morphine 4 MG/ML injection 4 mg (4 mg Intravenous Given 06/26/21 0959)  lactated ringers bolus 1,000 mL (1,000 mLs Intravenous New Bag/Given 06/26/21 0957)  ondansetron (ZOFRAN) injection 4 mg (4 mg Intravenous Given 06/26/21 07893    ED Course  I have reviewed the triage vital signs and the nursing notes.  Pertinent labs & imaging results that were available during my care of the patient were reviewed by me and considered in my medical decision making (see chart for details).  30y/o F presents to the ED for evaluation of upper abdominal pain with nausea and 2 episodes of vomiting over the past few days. Differential diagnosis includes but is not limited to viral illness, pancreatitis, infectious gastroenteritis, pregnancy, SBO. The patient is hemodynamically stable. Afebrile. Mildly  tachycardic at times. Satting 97% on  room air. Physical exam shows moist mucous membranes. Mild generalized epigastric tenderness to palpation. Ileostomy bag present without bloody output or surrounding erythema/induration. Otherwise normal exam.  The patient was given a total of 8 mg of morphine for pain.  Zofran ordered along with 1 L of lactated Ringer's.  On reevaluation, the patient reports she is feeling better and denies any additional nausea medication at this time.  Abdomen exam remains unchanged.  Labs ordered.  hCG negative.  CMP shows mildly decreased bicarb, consistent with patient's vomiting.  Slightly elevated glucose of 149.Slightly elevated creatinine at 1.07 from patient's baseline at 0.87 one year ago.  Alkaline phosphatase is elevated at 187, other LFTs normal.  CBC shows slightly elevated white blood cell count at 13.5.  No signs of anemia.  Elevated lipase at 288.  Patient reports she has a history of pancreatitis and this does feel like her previous episode a few years ago.  Will order CT scan.  Abdomen pelvis shows no acute abnormality seen in the abdomen or pelvis.  She is status postcholecystectomy.  No biliary dilation.  Pancreas is unremarkable and there is no pancreatic ductal dilation or surrounding inflammation [place] changes.  She has stable mild splenomegaly.  I discussed the elevated lipase along with normal abdominal CT scan with my attending who recommended discharge with pancreatitis discharge instructions as well as follow-up with her established GI provider.  I discussed the lab and imaging findings with the patient and significant other in the room.  Discussed that this will be treated like it was pancreatitis with clear fluids for the first 24 hours and gradually increasing to a bland diet.  The patient reports that she does not want to go home with any pain medication, and instead would like additional pain medication in the emergency department.  She declines any  refills of her Zofran and reports she has plenty at home.  Recommend that she can take Tylenol at home as she does not take any NSAIDs.  I recommended that she stay hydrated with plenty of fluids, mainly water.  Strict return precautions discussed with the patient.  Patient agrees to plan.  Patient is stable be discharged home in good condition.  I discussed this case with my attending physician who cosigned this note including patient's presenting symptoms, physical exam, and planned diagnostics and interventions. Attending physician stated agreement with plan or made changes to plan which were implemented.     MDM Rules/Calculators/A&P                           Final Clinical Impression(s) / ED Diagnoses Final diagnoses:  Pain of upper abdomen  Nausea and vomiting, unspecified vomiting type    Rx / DC Orders ED Discharge Orders     None        Sherrell Puller, PA-C 06/29/21 2225    Lajean Saver, MD 06/30/21 1213

## 2021-06-26 NOTE — Discharge Instructions (Addendum)
You were seen today for evaluation of your abdominal pain, nausea, and vomiting. You labs shows elevated Alkaline Phosphatase and Lipase, but your abdominal CT scan was normal. Please follow up with your established GI provider within the week for re-evaluation. You can take Tylenol or your Zofran for nausea and symptoms as well.   Clear fluids as tolerated for the next 24 hours. Advance to bland diet. If you have any concern, new or worsening symptoms, please return to the nearest emergency department.

## 2021-06-26 NOTE — ED Notes (Signed)
Patient transported to MRI 

## 2021-06-26 NOTE — ED Triage Notes (Addendum)
Pt in with upper abdominal pain that began initially after sushi on 12/26. States dull pain to both R & L lower abdomen ongoing as well. Pt has UC w/ileostomy, present for 5 yrs. States she has had decreased output and more watery consistency since. C/o 2 episodes of emesis tonight. Reports abdomen has been bloated past few days

## 2021-06-28 ENCOUNTER — Other Ambulatory Visit: Payer: Self-pay

## 2021-06-28 ENCOUNTER — Inpatient Hospital Stay (HOSPITAL_COMMUNITY)
Admission: EM | Admit: 2021-06-28 | Discharge: 2021-07-01 | DRG: 439 | Disposition: A | Discharge status: Home / Self Care | Payer: Managed Care, Other (non HMO) | Attending: Family Medicine | Admitting: Family Medicine

## 2021-06-28 ENCOUNTER — Encounter (HOSPITAL_COMMUNITY): Payer: Self-pay | Admitting: *Deleted

## 2021-06-28 DIAGNOSIS — Z818 Family history of other mental and behavioral disorders: Secondary | ICD-10-CM

## 2021-06-28 DIAGNOSIS — Z833 Family history of diabetes mellitus: Secondary | ICD-10-CM

## 2021-06-28 DIAGNOSIS — I10 Essential (primary) hypertension: Secondary | ICD-10-CM | POA: Diagnosis present

## 2021-06-28 DIAGNOSIS — K859 Acute pancreatitis without necrosis or infection, unspecified: Principal | ICD-10-CM | POA: Diagnosis present

## 2021-06-28 DIAGNOSIS — K5190 Ulcerative colitis, unspecified, without complications: Secondary | ICD-10-CM | POA: Diagnosis present

## 2021-06-28 DIAGNOSIS — K219 Gastro-esophageal reflux disease without esophagitis: Secondary | ICD-10-CM | POA: Diagnosis present

## 2021-06-28 DIAGNOSIS — Z79899 Other long term (current) drug therapy: Secondary | ICD-10-CM

## 2021-06-28 DIAGNOSIS — F429 Obsessive-compulsive disorder, unspecified: Secondary | ICD-10-CM

## 2021-06-28 DIAGNOSIS — Z932 Ileostomy status: Secondary | ICD-10-CM

## 2021-06-28 DIAGNOSIS — Z888 Allergy status to other drugs, medicaments and biological substances status: Secondary | ICD-10-CM

## 2021-06-28 DIAGNOSIS — R1013 Epigastric pain: Secondary | ICD-10-CM | POA: Diagnosis present

## 2021-06-28 DIAGNOSIS — K5180 Other ulcerative colitis without complications: Secondary | ICD-10-CM

## 2021-06-28 DIAGNOSIS — E669 Obesity, unspecified: Secondary | ICD-10-CM | POA: Diagnosis present

## 2021-06-28 DIAGNOSIS — F32A Depression, unspecified: Secondary | ICD-10-CM | POA: Diagnosis present

## 2021-06-28 DIAGNOSIS — E876 Hypokalemia: Secondary | ICD-10-CM | POA: Diagnosis present

## 2021-06-28 DIAGNOSIS — Z87891 Personal history of nicotine dependence: Secondary | ICD-10-CM

## 2021-06-28 DIAGNOSIS — K760 Fatty (change of) liver, not elsewhere classified: Secondary | ICD-10-CM | POA: Diagnosis present

## 2021-06-28 DIAGNOSIS — E872 Acidosis, unspecified: Secondary | ICD-10-CM | POA: Diagnosis present

## 2021-06-28 DIAGNOSIS — Z20822 Contact with and (suspected) exposure to covid-19: Secondary | ICD-10-CM | POA: Diagnosis present

## 2021-06-28 DIAGNOSIS — D649 Anemia, unspecified: Secondary | ICD-10-CM | POA: Diagnosis present

## 2021-06-28 DIAGNOSIS — Z886 Allergy status to analgesic agent status: Secondary | ICD-10-CM

## 2021-06-28 DIAGNOSIS — R109 Unspecified abdominal pain: Secondary | ICD-10-CM

## 2021-06-28 DIAGNOSIS — Z6839 Body mass index (BMI) 39.0-39.9, adult: Secondary | ICD-10-CM

## 2021-06-28 LAB — CBC
HCT: 37 % (ref 36.0–46.0)
Hemoglobin: 11.7 g/dL — ABNORMAL LOW (ref 12.0–15.0)
MCH: 25.8 pg — ABNORMAL LOW (ref 26.0–34.0)
MCHC: 31.6 g/dL (ref 30.0–36.0)
MCV: 81.7 fL (ref 80.0–100.0)
Platelets: 351 10*3/uL (ref 150–400)
RBC: 4.53 MIL/uL (ref 3.87–5.11)
RDW: 13.3 % (ref 11.5–15.5)
WBC: 8.7 10*3/uL (ref 4.0–10.5)
nRBC: 0 % (ref 0.0–0.2)

## 2021-06-28 LAB — COMPREHENSIVE METABOLIC PANEL
ALT: 18 U/L (ref 0–44)
AST: 16 U/L (ref 15–41)
Albumin: 3.4 g/dL — ABNORMAL LOW (ref 3.5–5.0)
Alkaline Phosphatase: 155 U/L — ABNORMAL HIGH (ref 38–126)
Anion gap: 10 (ref 5–15)
BUN: 9 mg/dL (ref 6–20)
CO2: 19 mmol/L — ABNORMAL LOW (ref 22–32)
Calcium: 8.6 mg/dL — ABNORMAL LOW (ref 8.9–10.3)
Chloride: 109 mmol/L (ref 98–111)
Creatinine, Ser: 0.92 mg/dL (ref 0.44–1.00)
GFR, Estimated: >60 mL/min (ref >60)
Glucose, Bld: 106 mg/dL — ABNORMAL HIGH (ref 70–99)
Potassium: 3.4 mmol/L — ABNORMAL LOW (ref 3.5–5.1)
Sodium: 138 mmol/L (ref 135–145)
Total Bilirubin: 0.5 mg/dL (ref 0.3–1.2)
Total Protein: 6.7 g/dL (ref 6.5–8.1)

## 2021-06-28 LAB — HIV ANTIBODY (ROUTINE TESTING W REFLEX): HIV Screen 4th Generation wRfx: NONREACTIVE

## 2021-06-28 LAB — RESP PANEL BY RT-PCR (FLU A&B, COVID) ARPGX2
Influenza A by PCR: NEGATIVE
Influenza B by PCR: NEGATIVE
SARS Coronavirus 2 by RT PCR: NEGATIVE

## 2021-06-28 LAB — LIPASE, BLOOD: Lipase: 284 U/L — ABNORMAL HIGH (ref 11–51)

## 2021-06-28 MED ORDER — ENOXAPARIN SODIUM 40 MG/0.4ML IJ SOSY
40.0000 mg | PREFILLED_SYRINGE | INTRAMUSCULAR | Status: DC
  Administered 2021-06-28 – 2021-06-29 (×2): 40 mg via SUBCUTANEOUS
  Filled 2021-06-28 (×3): qty 0.4

## 2021-06-28 MED ORDER — HYDROCODONE-ACETAMINOPHEN 5-325 MG PO TABS
1.0000 | ORAL_TABLET | Freq: Four times a day (QID) | ORAL | Status: DC | PRN
  Administered 2021-06-30: 1 via ORAL
  Filled 2021-06-28 (×2): qty 1

## 2021-06-28 MED ORDER — OLANZAPINE-FLUOXETINE HCL 3-25 MG PO CAPS: 2.0000 | ORAL_CAPSULE | Freq: Every day | ORAL | Status: DC

## 2021-06-28 MED ORDER — SODIUM CHLORIDE 0.9 % IV SOLN: INTRAVENOUS | Status: DC

## 2021-06-28 MED ORDER — ONDANSETRON HCL 4 MG PO TABS: 4.0000 mg | ORAL_TABLET | Freq: Four times a day (QID) | ORAL | Status: DC | PRN

## 2021-06-28 MED ORDER — VORTIOXETINE HBR 5 MG PO TABS: 10.0000 mg | ORAL_TABLET | Freq: Every day | ORAL | Status: DC

## 2021-06-28 MED ORDER — OLANZAPINE-FLUOXETINE HCL 3-25 MG PO CAPS
2.0000 | ORAL_CAPSULE | Freq: Every day | ORAL | Status: DC
  Administered 2021-06-29 – 2021-06-30 (×2): 2 via ORAL
  Filled 2021-06-28 (×4): qty 2

## 2021-06-28 MED ORDER — ALBUTEROL SULFATE (2.5 MG/3ML) 0.083% IN NEBU: 2.5000 mg | INHALATION_SOLUTION | Freq: Four times a day (QID) | RESPIRATORY_TRACT | Status: DC | PRN

## 2021-06-28 MED ORDER — PANTOPRAZOLE SODIUM 40 MG PO TBEC
40.0000 mg | DELAYED_RELEASE_TABLET | Freq: Every day | ORAL | Status: DC
  Filled 2021-06-28: qty 1

## 2021-06-28 MED ORDER — ONDANSETRON HCL 4 MG/2ML IJ SOLN
4.0000 mg | Freq: Four times a day (QID) | INTRAMUSCULAR | Status: DC | PRN
  Administered 2021-06-28: 4 mg via INTRAVENOUS
  Filled 2021-06-28: qty 2

## 2021-06-28 MED ORDER — ACETAMINOPHEN 325 MG PO TABS: 650.0000 mg | ORAL_TABLET | Freq: Four times a day (QID) | ORAL | Status: DC | PRN

## 2021-06-28 MED ORDER — VORTIOXETINE HBR 5 MG PO TABS
10.0000 mg | ORAL_TABLET | Freq: Every day | ORAL | Status: DC
  Administered 2021-06-29 – 2021-06-30 (×2): 10 mg via ORAL
  Filled 2021-06-28 (×4): qty 2

## 2021-06-28 MED ORDER — HYDROMORPHONE HCL 1 MG/ML IJ SOLN
1.0000 mg | Freq: Once | INTRAMUSCULAR | Status: AC
  Administered 2021-06-28: 1 mg via INTRAVENOUS
  Filled 2021-06-28: qty 1

## 2021-06-28 MED ORDER — CALCIUM GLUCONATE-NACL 1-0.675 GM/50ML-% IV SOLN
1.0000 g | Freq: Once | INTRAVENOUS | Status: AC
  Administered 2021-06-28: 1000 mg via INTRAVENOUS
  Filled 2021-06-28: qty 50

## 2021-06-28 MED ORDER — VORTIOXETINE HBR 5 MG PO TABS
10.0000 mg | ORAL_TABLET | Freq: Every day | ORAL | Status: DC
  Filled 2021-06-28: qty 2

## 2021-06-28 MED ORDER — POTASSIUM CHLORIDE IN NACL 20-0.45 MEQ/L-% IV SOLN: INTRAVENOUS | Status: DC

## 2021-06-28 MED ORDER — ACETAMINOPHEN 650 MG RE SUPP: 650.0000 mg | Freq: Four times a day (QID) | RECTAL | Status: DC | PRN

## 2021-06-28 MED ORDER — HYDROMORPHONE HCL 1 MG/ML IJ SOLN
0.5000 mg | INTRAMUSCULAR | Status: DC | PRN
Start: 2021-06-28 — End: 2021-07-01
  Administered 2021-06-28 – 2021-07-01 (×23): 0.5 mg via INTRAVENOUS
  Filled 2021-06-28 (×10): qty 0.5
  Filled 2021-06-28: qty 1
  Filled 2021-06-28 (×13): qty 0.5

## 2021-06-28 MED ORDER — SODIUM CHLORIDE 0.9 % IV BOLUS
1000.0000 mL | Freq: Once | INTRAVENOUS | Status: AC
  Administered 2021-06-28: 1000 mL via INTRAVENOUS

## 2021-06-28 MED ORDER — POTASSIUM CHLORIDE IN NACL 20-0.45 MEQ/L-% IV SOLN
INTRAVENOUS | Status: AC
  Filled 2021-06-28 (×6): qty 1000

## 2021-06-28 MED ORDER — ACETAZOLAMIDE ER 500 MG PO CP12
500.0000 mg | ORAL_CAPSULE | Freq: Three times a day (TID) | ORAL | Status: DC
  Administered 2021-06-28 – 2021-06-30 (×8): 500 mg via ORAL
  Filled 2021-06-28 (×13): qty 1

## 2021-06-28 MED ORDER — OLANZAPINE-FLUOXETINE HCL 3-25 MG PO CAPS
2.0000 | ORAL_CAPSULE | Freq: Every day | ORAL | Status: DC
  Filled 2021-06-28: qty 2

## 2021-06-28 MED ORDER — PANTOPRAZOLE SODIUM 40 MG PO TBEC
40.0000 mg | DELAYED_RELEASE_TABLET | Freq: Every day | ORAL | Status: DC
  Administered 2021-06-29 – 2021-06-30 (×2): 40 mg via ORAL
  Filled 2021-06-28 (×3): qty 1

## 2021-06-28 NOTE — H&P (Addendum)
History and Physical    Diana Page IRW:[id number] DOB: 16-Nov-1990 DOA: 06/28/2021  Referring MD/NP/PA: [name], MD PCP: [facility] Patient coming from: Home  Chief Complaint: Abdominal pain  I have personally briefly reviewed patient's old medical records in [place]   HPI: Diana Page is a 31 y.o. female with medical history significant of ulcerative colitis s/p total colectomy with end ileostomy and proctectomy, pancreatitis, s/p cholecystectomy, OCD, depression, and obesity presents with complaints of abdominal pain.  Symptoms started on 12/26 after she had eaten sushi.  Initially thought symptoms were secondary to trapped gas this pain was located epigastrically and around her stoma.  Noted associated symptoms of nausea, vomiting x2, and increased watery output from her ostomy.  She was seen in the emergency department on 12/31 due to pain and had been evaluated with a CT scan of the abdomen and pelvis that did not show any signs of any acute abnormality.  At that time labs noted WBC elevated at 13.5, platelets 461, and lipase 288.  She had been treated with 1 L normal saline IV fluids and morphine IV.  Ultimately patient had been discharged home and had been offered Vicodin, but reports declining this medication as she thought she could manage symptoms with Tylenol.  However, since getting home patient reports that she has had no more episodes of vomiting.  She is only been able to tolerate liquids for the most part.  She tried eating some bread the other day and it caused her to have significant abdominal pain.  Due to decreased p.o. intake her output from her ostomy has been less.  Although she also reports passing a few small clots yesterday evening in her stools.  Denies having any significant fevers, chest pain, or shortness of breath.  Since she had the total colectomy with end ileostomy and subsequent proctectomy she has not had any significant flares of  her ulcerative colitis and had been being followed in the outpatient setting by Dr. Benson Norway.  She had completed her last dose of phentermine yesterday.    ED Course:On admission into the emergency department patient was notedAfebrile, pulse 93-108, respirations 18-22, blood pressure as low as 101/52-125/79, and O2 saturations maintained.Labs noted hemoglobin 11.7, potassium 3.4, CO2 19, BUN 9, Cr 0.92, calcium 8.6, albumin 3.4, alk phosphatase 155, and lipase 284.  Patient had been given 1 L normal saline IV fluids and Dilaudid IV.  [facility] called to admit.  Review of Systems  Constitutional:  Negative for fever.  Eyes:  Negative for photophobia and pain.  Respiratory:  Negative for cough and shortness of breath.   Cardiovascular:  Negative for chest pain and leg swelling.  Gastrointestinal:  Positive for abdominal pain, blood in stool, nausea and vomiting.  Genitourinary:  Negative for dysuria.  Musculoskeletal:  Negative for falls.  Skin:  Negative for rash.  Neurological:  Negative for loss of consciousness.  Psychiatric/Behavioral:  Negative for substance abuse.   Otherwise a 12 point review of systems was performed and negative except for as noted above in HPI  Past Medical History:  Diagnosis Date   Anemia    Anxiety    Back pain    Depression    Fatty liver    GERD (gastroesophageal reflux disease)    Headache 01/04/2015   Hx of cholecystectomy    Hypertension    Intercranial Hypertension   IBS (irritable bowel syndrome)    Joint pain    Obsessive compulsive disorder 11/2012  Ostomy nurse consultation    Pancreatitis    hx of    UC (ulcerative colitis) ([place])     Past Surgical History:  Procedure Laterality Date   CHOLECYSTECTOMY     LAPAROSCOPIC PARTIAL PROTECTOMY     PERMANENT ILEOSTOMY     TOTAL COLECTOMY N/A      reports that she quit smoking about 12 years ago. Her smoking use included cigarettes and e-cigarettes. She has never used smokeless tobacco. She reports  that she does not drink alcohol and does not use drugs.  Allergies  Allergen Reactions   Nsaids Other (See Comments)    Cannot have because of her Ulcerative Colitis   Tolmetin Other (See Comments)    Cannot have because of her Ulcerative Colitis    Family History  Problem Relation Age of Onset   OCD Mother    Diabetes Mother    Depression Mother    Anxiety disorder Mother    Obesity Mother    Obesity Father    OCD Sister    Depression Sister    OCD Brother    Anxiety disorder Brother     Prior to Admission medications   Medication Sig Start Date End Date Taking? Authorizing Provider  acetaminophen (TYLENOL) 500 MG tablet Take 500-1,000 mg by mouth every 6 (six) hours as needed (for pain).   Yes [provider]  acetaZOLAMIDE ER (DIAMOX) 500 MG capsule TAKE 1 CAPSULE BY MOUTH THREE TIMES A DAY Patient taking differently: Take 500 mg by mouth in the morning, at noon, and at bedtime. 05/10/21  Yes [name], MD  OLANZapine-FLUoxetine (SYMBYAX) 6-50 MG capsule Take 1 capsule by mouth daily. 04/22/21  Yes Arfeen, Arlyce Harman, MD  ondansetron (ZOFRAN-ODT) 4 MG disintegrating tablet Take 4 mg by mouth 3 (three) times daily. 09/07/20  Yes [provider]  Ostomy Supplies MISC by Does not apply route. 09/06/14  Yes [provider]  pantoprazole (PROTONIX) 40 MG tablet Take 40 mg by mouth daily.   Yes [provider]  phentermine (ADIPEX-P) 37.5 MG tablet Take 37.5 mg by mouth daily. 03/31/21  Yes [provider]  vortioxetine HBr (TRINTELLIX) 10 MG TABS tablet Take 1 tablet (10 mg total) by mouth daily. 04/22/21  Yes Arfeen, Arlyce Harman, MD  ALPRAZolam Duanne Moron) 0.5 MG tablet as needed. Patient not taking: Reported on 04/22/2021 05/09/19   [provider]  Incontinence Supplies ([place]) [place] by Does not apply route. Patient not taking: Reported on 06/28/2021 09/06/14   [provider]    Physical  Exam:  Constitutional: Middle-aged female who appears to feel unwell, but able to follow commands. Vitals:   06/28/21 0504 06/28/21 0545 06/28/21 0800 06/28/21 0815  BP: 121/74 125/79 (!) 101/52 112/75  Pulse: (!) 108 93 80 76  Resp: 18 (!) _0 Temp: 98.2 F (36.8 C)     TempSrc: Oral     SpO2: 96% 100% 99% 98%   Eyes: PERRL, lids and conjunctivae normal ENMT: Mucous membranes are moist. Posterior pharynx clear of any exudate or lesions.  Neck: normal, supple, no masses, no thyromegaly Respiratory: clear to auscultation bilaterally, no wheezing, no crackles. Normal respiratory effort. No accessory muscle use.  Cardiovascular: Regular rate and rhythm, no murmurs / rubs / gallops. No extremity edema. 2+ pedal pulses. No carotid bruits.  Abdomen: Epigastric tenderness appreciated.  Patient has ileostomy present of the right side abdomen. Musculoskeletal: no clubbing / cyanosis. No joint deformity upper and  lower extremities. Good ROM, no contractures. Normal muscle tone.  Skin: no rashes, lesions, ulcers. No induration Neurologic: CN 2-12 grossly intact. Sensation intact, DTR normal. Strength 5/5 in all 4.  Psychiatric: Normal judgment and insight. Alert and oriented x 3. Normal mood.     Labs on Admission: I have personally reviewed following labs and imaging studies  CBC: Recent Labs  Lab 06/26/21 0138 06/28/21 0605  WBC 13.5* 8.7  HGB 12.3 11.7*  HCT 39.8 37.0  MCV 82.7 81.7  PLT 461* 536   Basic Metabolic Panel: Recent Labs  Lab 06/26/21 0138 06/28/21 0605  NA 135 138  K 3.6 3.4*  CL 108 109  CO2 19* 19*  GLUCOSE 149* 106*  BUN 12 9  CREATININE 1.07* 0.92  CALCIUM 8.8* 8.6*   GFR: Estimated Creatinine Clearance: 97.3 mL/min (by C-G formula based on SCr of 0.92 mg/dL). Liver Function Tests: Recent Labs  Lab 06/26/21 0138 06/28/21 0605  AST 15 16  ALT 20 18  ALKPHOS 187* 155*  BILITOT 0.4 0.5  PROT 7.6 6.7  ALBUMIN 3.8 3.4*   Recent Labs  Lab  06/26/21 0138 06/28/21 0605  LIPASE 288* 284*   No results for input(s): AMMONIA in the last 168 hours. Coagulation Profile: No results for input(s): INR, PROTIME in the last 168 hours. Cardiac Enzymes: No results for input(s): CKTOTAL, CKMB, CKMBINDEX, TROPONINI in the last 168 hours. BNP (last 3 results) No results for input(s): PROBNP in the last 8760 hours. HbA1C: No results for input(s): HGBA1C in the last 72 hours. CBG: No results for input(s): GLUCAP in the last 168 hours. Lipid Profile: No results for input(s): CHOL, HDL, LDLCALC, TRIG, CHOLHDL, LDLDIRECT in the last 72 hours. Thyroid Function Tests: No results for input(s): TSH, T4TOTAL, FREET4, T3FREE, THYROIDAB in the last 72 hours. Anemia Panel: No results for input(s): VITAMINB12, FOLATE, FERRITIN, TIBC, IRON, RETICCTPCT in the last 72 hours. Urine analysis:    Component Value Date/Time   COLORURINE YELLOW 06/26/2021 0132   APPEARANCEUR HAZY (A) 06/26/2021 0132   LABSPEC 1.005 06/26/2021 0132   PHURINE 5.0 06/26/2021 0132   GLUCOSEU NEGATIVE 06/26/2021 0132   HGBUR NEGATIVE 06/26/2021 0132   BILIRUBINUR NEGATIVE 06/26/2021 0132   KETONESUR NEGATIVE 06/26/2021 0132   PROTEINUR NEGATIVE 06/26/2021 0132   UROBILINOGEN 0.2 12/27/2014 0003   NITRITE NEGATIVE 06/26/2021 0132   LEUKOCYTESUR NEGATIVE 06/26/2021 0132   Sepsis Labs: No results found for this or any previous visit (from the past 240 hour(s)).   Radiological Exams on Admission: CT ABDOMEN PELVIS W CONTRAST  Result Date: 06/26/2021 CLINICAL DATA:  Acute epigastric abdominal pain. EXAM: CT ABDOMEN AND PELVIS WITH CONTRAST TECHNIQUE: Multidetector CT imaging of the abdomen and pelvis was performed using the standard protocol following bolus administration of intravenous contrast. CONTRAST:  36m OMNIPAQUE IOHEXOL 300 MG/ML  SOLN COMPARISON:  June 28, 2018. FINDINGS: Lower chest: No acute abnormality. Hepatobiliary: Status post cholecystectomy. No biliary  dilatation is noted. No focal hepatic abnormality is noted. Hepatomegaly is again noted. Pancreas: Unremarkable. No pancreatic ductal dilatation or surrounding inflammatory changes. Spleen: Stable mild splenomegaly. Adrenals/Urinary Tract: Adrenal glands are unremarkable. Kidneys are normal, without renal calculi, focal lesion, or hydronephrosis. Bladder is unremarkable. Stomach/Bowel: The stomach appears normal. Status post colectomy. Ileostomy is noted in right lower quadrant. No evidence of bowel obstruction or inflammation is noted. Vascular/Lymphatic: No significant vascular findings are present. No enlarged abdominal or pelvic lymph nodes. Reproductive: Uterus and bilateral adnexa are unremarkable. Other: No abdominal wall  hernia or abnormality. No abdominopelvic ascites. Musculoskeletal: No acute or significant osseous findings. IMPRESSION: No acute abnormality seen in the abdomen or pelvis. Electronically Signed   By: Marijo Conception M.D.   On: 06/26/2021 11:44    CT of the abdomen and pelvis with contrast: Independently reviewed.  No acute abnormality noted  Assessment/Plan Suspected pancreatitis: Acute.  Patient presents with complaints of epigastric abdominal pain along with pain over her ostomy site.  CT scan of the abdomen and pelvis with contrast negative for any acute abnormality.  Lipase elevated 284, and relatively unchanged from 12/31 of 288.  Patient started on IV fluids and given IV pain medication.  Suspected mild pancreatitis.  She is followed by Dr. Geoffry Paradise of gastroenterology in the outpatient setting. -Admit to a MedSurg bed -Clear liquid diet and advance as tolerated -Check triglyceride level -1/2 Normal saline with 20 mEq of KCl IV fluids at 125 mL/h -Hydrocodone/IV Dilaudid as needed for moderate to severe pain respectively -Dr. Benson Norway of gastroenterology notified of the patient admission to the hospital -Pharmacy consult to review meds for causes of pancreatitis  Hypokalemia:  Acute.  Potassium 3.4 on admission. -IV fluids as seen above -Continue to monitor and replace as needed  Ulcerative colitis s/p colectomy: Patient reports that she has not had a ulcerative colitis flare since total colectomy with end ileostomy 08/2014 and subsequent proctectomy 08/2015.  Follows in the outpatient setting with Dr. Benson Norway of GI. -Ostomy care as needed   Metabolic acidosis: Chronic.  CO2 19 without elevated anion gap on admission. -Continue acetazolamide  Normocytic anemia: Acute.  Hemoglobin 11.7 g/dL which appears just slightly lower than previous baseline of 12-13 g/dL.  Patient did report passing a small amount of blood clots in stool yesterday evening. -Continue to monitor H&H  Elevated alkaline phosphatase: Chronic.  Alkaline phosphatase 155 which appears similar to previous values.  Unclear cause and may warrant further work-up in outpatient setting.  Depression -Continue Trintellix  OCD -Continue olanzapine- fluoxetine  GERD -Continue Protonix  Obesity: BMI 39.36 kg/m  DVT prophylaxis: Lovenox Code Status: Full Family Communication: None requested Disposition Plan: Likely discharge home once medically stable Consults called: GI Admission status: Observation  Norval Morton MD [facility]   If 7PM-7AM, please contact night-coverage   06/28/2021, 10:13 AM

## 2021-06-28 NOTE — ED Provider Notes (Signed)
°  Physical Exam  BP 112/75    Pulse 76    Temp 98.2 F (36.8 C) (Oral)    Resp 15    LMP 06/18/2021    SpO2 98%   Physical Exam  ED Course/Procedures     Procedures  MDM  Received patient in signout.  Upper abdominal pain.  Recently seen for same.  Had elevated lipase.  History of pancreatitis.  Also history of ulcerative colitis post colectomy.  Has ileostomy.  States she has had decreased ileostomy work-up.  States always little dehydrated but is unable to drink.  Has been seen in the ER couple days ago and had CT scan that was reassuring but lipase was elevated.  Discharged home.  Since then has not really been able to drink.  Oral trial here increased her pain.  Has had Dilaudid but cannot tolerate orals.  With failure of outpatient management will admit to hospital       [name], MD 06/28/21 [phone]

## 2021-06-28 NOTE — Progress Notes (Signed)
Pharmacy Consult to Review Home Medications and Association with Pancreatitis  Diana Page is a 31 yr old female admitted today with suspected pancreatitis. She has hx of ulcerative colitis, S/P colectomy with ostomy (pt reports no UC flares since colectomy in 2016 and subsequent protectomy in 2017). CT of abdomen and pelvis were negative for any acute abnormality. Lipase elevated at 284 (relatively unchanged from 288 on 12/31).   Pharmacy was consulted to review pt's home medications for association with pancreatitis.   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, which stimulates pancreatic enzyme secretion, leading to pancreatitis (of note, there are are some reports of PPIs benefitting pts with acute pancreatitis)  Fluoxetine: SSRIs have been associated with acute pancreatitis, but the risk is low and similar to other classes of antidepressants  Phentermine: there are case reports of acute pancreatitis with this medication   Acetazolamide: literature reports indicate that this medication may be used as a treatment adjunct for acute pancreatitis because it reduces volume of pancreatitic secretions  Summary:  Several of the pt's home medications have been associated with acute pancreatitis, the most notable being olanzepine and vortioxetine, although the contribution of other home medications (see above) cannot be  ruled out. Suggest checking triglyceride level in this pt, which may help rule out possible contribution of olanzepine to this pt's pancreatitis.

## 2021-06-28 NOTE — ED Provider Notes (Signed)
[facility] Emergency Department Provider Note MRN:  [id number]  Arrival date & time: 06/28/21     Chief Complaint   Abdominal Pain   History of Present Illness   Diana Page is a 31 y.o. year-old female with a history of ulcerative colitis presenting to the ED with chief complaint of abdominal pain.  Worsening abdominal pain over the past 7 days.  Worst in the epigastrium.  Has had an ED visit for the same complaint a few days ago, discharged after a CT scan.  Pain worsened and noticed a small amount of blood in her ileostomy bag.  Review of Systems  A thorough review of systems was obtained and all systems are negative except as noted in the HPI and PMH.   Patient's Health History    Past Medical History:  Diagnosis Date   Anemia    Anxiety    Back pain    Depression    Fatty liver    GERD (gastroesophageal reflux disease)    Headache 01/04/2015   Hx of cholecystectomy    Hypertension    Intercranial Hypertension   IBS (irritable bowel syndrome)    Joint pain    Obsessive compulsive disorder 11/2012   Ostomy nurse consultation    Pancreatitis    hx of    UC (ulcerative colitis) ([place])     Past Surgical History:  Procedure Laterality Date   CHOLECYSTECTOMY     LAPAROSCOPIC PARTIAL PROTECTOMY     PERMANENT ILEOSTOMY     TOTAL COLECTOMY N/A     Family History  Problem Relation Age of Onset   OCD Mother    Diabetes Mother    Depression Mother    Anxiety disorder Mother    Obesity Mother    Obesity Father    OCD Sister    Depression Sister    OCD Brother    Anxiety disorder Brother     Social History   Socioeconomic History   Marital status: Married    Spouse name: Not on file   Number of children: 0   Years of education: Ba   Highest education level: Not on file  Occupational History   Occupation: Librarian, academic     Comment: [employer] adviser and [employer]  Tobacco Use   Smoking status: Former    Types: Cigarettes, E-cigarettes    Quit  date: 06/27/2009    Years since quitting: 12.0   Smokeless tobacco: Never  Vaping Use   Vaping Use: Former  Substance and Sexual Activity   Alcohol use: Never    Comment: occasionally, "maybe once or twice a year"   Drug use: No   Sexual activity: Not Currently    Partners: Male    Birth control/protection: Condom  Other Topics Concern   Not on file  Social History Narrative   Lives at home with husband, Lennette Bihari.   Caffeine use: maybe 5 big cups daily   Right handed   Social Determinants of Health   Financial Resource Strain: Not on file  Food Insecurity: Not on file  Transportation Needs: Not on file  Physical Activity: Not on file  Stress: Not on file  Social Connections: Not on file  Intimate Partner Violence: Not on file     Physical Exam   Vitals:   06/28/21 0504 06/28/21 0545  BP: 121/74 125/79  Pulse: (!) 108 93  Resp: 18 (!) 22  Temp: 98.2 F (36.8 C)   SpO2: 96% 100%    CONSTITUTIONAL: Well-appearing, NAD  NEURO:  Alert and oriented x 3, no focal deficits EYES:  eyes equal and reactive ENT/NECK:  no LAD, no JVD CARDIO: Tachycardic rate, well-perfused, normal S1 and S2 PULM:  CTAB no wheezing or rhonchi GI/GU:  non-distended, mild epigastric tenderness, ileostomy in place right lower quadrant MSK/SPINE:  No gross deformities, no edema SKIN:  no rash, atraumatic   *Additional and/or pertinent findings included in MDM below  Diagnostic and Interventional Summary    EKG Interpretation  Date/Time:    Ventricular Rate:    PR Interval:    QRS Duration:   QT Interval:    QTC Calculation:   R Axis:     Text Interpretation:         Labs Reviewed  CBC - Abnormal; Notable for the following components:      Result Value   Hemoglobin 11.7 (*)    MCH 25.8 (*)    All other components within normal limits  COMPREHENSIVE METABOLIC PANEL  LIPASE, BLOOD    No orders to display    Medications  HYDROmorphone (DILAUDID) injection 1 mg (has no  administration in time range)  sodium chloride 0.9 % bolus 1,000 mL (1,000 mLs Intravenous New Bag/Given 06/28/21 0645)     Procedures  /  Critical Care Procedures  ED Course and Medical Decision Making  Initial Impression and Ddx Patient presenting with worsening epigastric abdominal pain, complaint complicated by history of ulcerative colitis with ileostomy.  Noticed some small amount of bleeding in the colostomy bag today, not currently bleeding.  Was told during her last ED visit that she had mild pancreatitis.  Lipase level was 288 2 days ago.  Patient otherwise has reassuring vital signs and is well-appearing, has some mild tenderness but no rebound guarding or rigidity.  Will repeat labs, provide fluids and pain control.  Other considerations include IBD flare, infectious gastritis.  Awaiting labs, will need reassessment, signed out to oncoming provider.  Complexity of Problems Addressed Acute complicated illness or Injury  Additional Data Reviewed and Analyzed Further history obtained from: Past medical history and medications listed in the EMR, Prior ED visit notes, and Recent discharge summary  Patient Encounter Risk Assessment High:  Consideration of hospitalization and Use of parenteral controlled substances  Barth Kirks. [place] Small, [place] [email or web address]  Final Clinical Impressions(s) / ED Diagnoses     ICD-10-CM   1. Abdominal pain, unspecified abdominal location  R10.9       ED Discharge Orders     None        Discharge Instructions Discussed with and Provided to Patient:   Discharge Instructions   None      Maudie Flakes, MD 06/28/21 [phone]

## 2021-06-28 NOTE — ED Notes (Signed)
Drinking water states her stomach still hurts , however it was hurting before she started drinking water, no worse. Doesn't want to eat anything now.

## 2021-06-28 NOTE — ED Triage Notes (Signed)
Pt with upper abdominal pain and pain around the ileostomy area. Pt reports dark red blood "small clots' and mucous passing through ileostomy about 1 hour ago.

## 2021-06-29 ENCOUNTER — Encounter (HOSPITAL_COMMUNITY): Payer: Self-pay | Admitting: Internal Medicine

## 2021-06-29 DIAGNOSIS — K8501 Idiopathic acute pancreatitis with uninfected necrosis: Secondary | ICD-10-CM

## 2021-06-29 DIAGNOSIS — F429 Obsessive-compulsive disorder, unspecified: Secondary | ICD-10-CM | POA: Diagnosis present

## 2021-06-29 DIAGNOSIS — R1013 Epigastric pain: Secondary | ICD-10-CM | POA: Diagnosis present

## 2021-06-29 DIAGNOSIS — K760 Fatty (change of) liver, not elsewhere classified: Secondary | ICD-10-CM | POA: Diagnosis present

## 2021-06-29 DIAGNOSIS — Z79899 Other long term (current) drug therapy: Secondary | ICD-10-CM | POA: Diagnosis not present

## 2021-06-29 DIAGNOSIS — Z818 Family history of other mental and behavioral disorders: Secondary | ICD-10-CM | POA: Diagnosis not present

## 2021-06-29 DIAGNOSIS — E876 Hypokalemia: Secondary | ICD-10-CM | POA: Diagnosis present

## 2021-06-29 DIAGNOSIS — Z6839 Body mass index (BMI) 39.0-39.9, adult: Secondary | ICD-10-CM | POA: Diagnosis not present

## 2021-06-29 DIAGNOSIS — K859 Acute pancreatitis without necrosis or infection, unspecified: Secondary | ICD-10-CM | POA: Diagnosis present

## 2021-06-29 DIAGNOSIS — K219 Gastro-esophageal reflux disease without esophagitis: Secondary | ICD-10-CM | POA: Diagnosis present

## 2021-06-29 DIAGNOSIS — Z87891 Personal history of nicotine dependence: Secondary | ICD-10-CM | POA: Diagnosis not present

## 2021-06-29 DIAGNOSIS — K5190 Ulcerative colitis, unspecified, without complications: Secondary | ICD-10-CM | POA: Diagnosis present

## 2021-06-29 DIAGNOSIS — Z20822 Contact with and (suspected) exposure to covid-19: Secondary | ICD-10-CM | POA: Diagnosis present

## 2021-06-29 DIAGNOSIS — Z888 Allergy status to other drugs, medicaments and biological substances status: Secondary | ICD-10-CM | POA: Diagnosis not present

## 2021-06-29 DIAGNOSIS — Z833 Family history of diabetes mellitus: Secondary | ICD-10-CM | POA: Diagnosis not present

## 2021-06-29 DIAGNOSIS — Z886 Allergy status to analgesic agent status: Secondary | ICD-10-CM | POA: Diagnosis not present

## 2021-06-29 DIAGNOSIS — D649 Anemia, unspecified: Secondary | ICD-10-CM | POA: Diagnosis present

## 2021-06-29 DIAGNOSIS — Z932 Ileostomy status: Secondary | ICD-10-CM | POA: Diagnosis not present

## 2021-06-29 DIAGNOSIS — I10 Essential (primary) hypertension: Secondary | ICD-10-CM | POA: Diagnosis present

## 2021-06-29 DIAGNOSIS — E872 Acidosis, unspecified: Secondary | ICD-10-CM | POA: Diagnosis present

## 2021-06-29 DIAGNOSIS — K5180 Other ulcerative colitis without complications: Secondary | ICD-10-CM | POA: Diagnosis not present

## 2021-06-29 DIAGNOSIS — F32A Depression, unspecified: Secondary | ICD-10-CM | POA: Diagnosis present

## 2021-06-29 LAB — CBC
HCT: 34.2 % — ABNORMAL LOW (ref 36.0–46.0)
Hemoglobin: 10.7 g/dL — ABNORMAL LOW (ref 12.0–15.0)
MCH: 25.7 pg — ABNORMAL LOW (ref 26.0–34.0)
MCHC: 31.3 g/dL (ref 30.0–36.0)
MCV: 82 fL (ref 80.0–100.0)
Platelets: 325 10*3/uL (ref 150–400)
RBC: 4.17 MIL/uL (ref 3.87–5.11)
RDW: 13.3 % (ref 11.5–15.5)
WBC: 9.2 10*3/uL (ref 4.0–10.5)
nRBC: 0 % (ref 0.0–0.2)

## 2021-06-29 LAB — TRIGLYCERIDES: Triglycerides: 220 mg/dL — ABNORMAL HIGH (ref <150)

## 2021-06-29 LAB — COMPREHENSIVE METABOLIC PANEL
ALT: 19 U/L (ref 0–44)
AST: 17 U/L (ref 15–41)
Albumin: 3.3 g/dL — ABNORMAL LOW (ref 3.5–5.0)
Alkaline Phosphatase: 154 U/L — ABNORMAL HIGH (ref 38–126)
Anion gap: 9 (ref 5–15)
BUN: <5 mg/dL — ABNORMAL LOW (ref 6–20)
CO2: 19 mmol/L — ABNORMAL LOW (ref 22–32)
Calcium: 8.5 mg/dL — ABNORMAL LOW (ref 8.9–10.3)
Chloride: 107 mmol/L (ref 98–111)
Creatinine, Ser: 0.82 mg/dL (ref 0.44–1.00)
Glucose, Bld: 91 mg/dL (ref 70–99)
Potassium: 3.7 mmol/L (ref 3.5–5.1)
Sodium: 135 mmol/L (ref 135–145)
Total Bilirubin: 0.6 mg/dL (ref 0.3–1.2)
Total Protein: 6.7 g/dL (ref 6.5–8.1)

## 2021-06-29 NOTE — Consult Note (Signed)
Reason for Consult: Pancreatitis Referring Physician: [facility]  Diana Page HPI: This is a 31 year old female who is well-known with a PMH of UC s/p proctocolectomy, pancreatitis, elevated AP, IBS, GERD, HTN, and fatty liver admitted for complaints of epigastric pain.  The pain started on 06/21/2021 after she ate [place].  Nausea and vomiting ensued and she noted an increase in her ostomy.  A CT scan on 06/26/2021 was performed in the ER and it was negative for any acute abnormalities.  She was treated with IV fluids and sent home.  She tried to manage her symptoms at home, but she represented to the hosptial and she was was admitted.  In the ER visit on 12/31/ her lipase was elevated at 288.  Her liver enzymes were normal and her WBC did normalized.  Today she was feeling better and she attempted to advance beyond a clear liquid diet, but her pain worsened.  She feels bloated and she states that the current pain is consistent with her pancreatitis pain.  Past Medical History:  Diagnosis Date   Anemia    Anxiety    Back pain    Depression    Fatty liver    GERD (gastroesophageal reflux disease)    Headache 01/04/2015   Hx of cholecystectomy    Hypertension    Intercranial Hypertension   IBS (irritable bowel syndrome)    Joint pain    Obsessive compulsive disorder 11/2012   Ostomy nurse consultation    Pancreatitis    hx of    UC (ulcerative colitis) ([place])     Past Surgical History:  Procedure Laterality Date   CHOLECYSTECTOMY     LAPAROSCOPIC PARTIAL PROTECTOMY     PERMANENT ILEOSTOMY     TOTAL COLECTOMY N/A     Family History  Problem Relation Age of Onset   OCD Mother    Diabetes Mother    Depression Mother    Anxiety disorder Mother    Obesity Mother    Obesity Father    Crohn's disease Father    OCD Sister    Depression Sister    Ulcerative colitis Sister    OCD Brother    Anxiety disorder Brother    Diverticulitis Brother     Social History:   reports that she quit smoking about 12 years ago. Her smoking use included cigarettes and e-cigarettes. She has never used smokeless tobacco. She reports that she does not drink alcohol and does not use drugs.  Allergies:  Allergies  Allergen Reactions   Nsaids Other (See Comments)    Cannot have because of her Ulcerative Colitis   Tolmetin Other (See Comments)    Cannot have because of her Ulcerative Colitis    Medications: Scheduled:  acetaZOLAMIDE ER  500 mg Oral TID   enoxaparin (LOVENOX) injection  40 mg Subcutaneous Q24H   OLANZapine-FLUoxetine  2 capsule Oral QHS   pantoprazole  40 mg Oral QHS   vortioxetine HBr  10 mg Oral QHS   Continuous:  sodium chloride 125 mL/hr at 06/29/21 0957    Results for orders placed or performed during the hospital encounter of 06/28/21 (from the past 24 hour(s))  CBC     Status: Abnormal   Collection Time: 06/29/21  5:26 AM  Result Value Ref Range   WBC 9.2 4.0 - 10.5 K/uL   RBC 4.17 3.87 - 5.11 MIL/uL   Hemoglobin 10.7 (L) 12.0 - 15.0 g/dL   HCT 34.2 (L) 36.0 - 46.0 %  MCV 82.0 80.0 - 100.0 fL   MCH 25.7 (L) 26.0 - 34.0 pg   MCHC 31.3 30.0 - 36.0 g/dL   RDW 13.3 11.5 - 15.5 %   Platelets 325 150 - 400 K/uL   nRBC 0.0 0.0 - 0.2 %  Comprehensive metabolic panel     Status: Abnormal   Collection Time: 06/29/21  5:26 AM  Result Value Ref Range   Sodium 135 135 - 145 mmol/L   Potassium 3.7 3.5 - 5.1 mmol/L   Chloride 107 98 - 111 mmol/L   CO2 19 (L) 22 - 32 mmol/L   Glucose, Bld 91 70 - 99 mg/dL   BUN <5 (L) 6 - 20 mg/dL   Creatinine, Ser 0.82 0.44 - 1.00 mg/dL   Calcium 8.5 (L) 8.9 - 10.3 mg/dL   Total Protein 6.7 6.5 - 8.1 g/dL   Albumin 3.3 (L) 3.5 - 5.0 g/dL   AST 17 15 - 41 U/L   ALT 19 0 - 44 U/L   Alkaline Phosphatase 154 (H) 38 - 126 U/L   Total Bilirubin 0.6 0.3 - 1.2 mg/dL   GFR, Estimated NOT CALCULATED >60 mL/min   Anion gap 9 5 - 15  Triglycerides     Status: Abnormal   Collection Time: 06/29/21  5:26 AM   Result Value Ref Range   Triglycerides 220 (H) <150 mg/dL     No results found.  ROS:  As stated above in the HPI otherwise negative.  Blood pressure 107/67, pulse 93, temperature 97.7 F (36.5 C), temperature source Oral, resp. rate 18, last menstrual period 06/18/2021, SpO2 97 %.    PE: Gen: NAD, Alert and Oriented HEENT:  [place]/AT, EOMI Neck: Supple, no LAD Lungs: CTA Bilaterally CV: RRR without M/G/R ABD: Soft, NTND, +BS Ext: No C/C/E  Assessment/Plan: 1) Acute pancreatitis. 2) Epigastric pain. 3) Bloating.   She is stable and her labs improved.  She had a set back with trying to advance her diet too quickly.  The CT scan was negative for any acute abnormalities.  Plan: 1) She will be provided a regular diet, but she can pick and choose as she desires.  The plan for now is to stay on clear liquids and to advance as she can tolerate. 2) Continue with supportive care.  [place] Maiolo D 06/29/2021, 3:37 PM

## 2021-06-29 NOTE — Progress Notes (Signed)
PROGRESS NOTE    Diana Page  VZC:[id number] DOB: 07/06/1990 DOA: 06/28/2021 PCP: Pcp, No   Brief Narrative:  Diana Page is a 31 y.o. female with medical history significant of ulcerative colitis s/p total colectomy with end ileostomy and proctectomy, pancreatitis, s/p cholecystectomy, OCD, depression, and obesity presented with complaints of abdominal pain.  Symptoms started on 12/26 after she had eaten sushi. Noted associated symptoms of nausea, vomiting x2, and increased watery output from her ostomy.  She was seen in the emergency department on 12/31 due to pain and had been evaluated with a CT scan of the abdomen and pelvis that did not show any signs of any acute abnormality.  At that time labs noted WBC elevated at 13.5, platelets 461, and lipase 288.  She had been treated with 1 L normal saline IV fluids and morphine IV.  Ultimately patient had been discharged home and had been offered Vicodin, but reports declining this medication as she thought she could manage symptoms with Tylenol.  However, since getting home patient reports that she has had no more episodes of vomiting so she returned to the ED. she sees Dr. Benson Norway as outpatient.  Upon admission to ED, she was hemodynamically stable.   Assessment & Plan:   Principal Problem:   Pancreatitis Active Problems:   Ulcerative colitis (HCC)   OCD (obsessive compulsive disorder)   Obesity   Depression   Hypokalemia   Epigastric pain   Acute pancreatitis  Possible acute pancreatitis: CT abdomen negative for acute pancreatitis but she did have elevated lipase level, at least 3 times more than normal.  She is feeling better.  Pain improving.  She is tolerating clears.  Will and wants to full liquid and then later soft.  GI is consulted.  Hypokalemia: Resolved.  History of ulcerative colitis s/p colectomy: Stable.  GI on board.   Metabolic acidosis: Chronic.  CO2 19 without elevated anion gap on admission. -Continue  acetazolamide   Normocytic anemia: Acute.  Hemoglobin 11.7 g/dL which appears just slightly lower than previous baseline of 12-13 g/dL.  It further dropped to 10.7 today.  Patient did report passing a small amount of blood clots in stool yesterday evening.  GI on board.   GERD -Continue Protonix   Obesity: BMI 39.36 kg/m.  Weight loss counseling provided.    DVT prophylaxis: enoxaparin (LOVENOX) injection 40 mg Start: 06/28/21 1100   Code Status: Full Code  Family Communication:  None present at bedside.  Plan of care discussed with patient in length and he verbalized understanding and agreed with it.  Status is: Inpatient  Remains inpatient appropriate because: Advancing diet slowly.   Estimated body mass index is 39.36 kg/m as calculated from the following:   Height as of 03/24/21: 5' 2.4" (1.585 m).   Weight as of 06/26/21: 98.9 kg.  Nutritional Assessment: There is no height or weight on file to calculate BMI.. Seen by dietician.  I agree with the assessment and plan as outlined below: Nutrition Status:   Skin Assessment: I have examined the patient's skin and I agree with the wound assessment as performed by the wound care RN as outlined below:    Consultants:  GI  Procedures:  None  Antimicrobials:  Anti-infectives (From admission, onward)    None          Subjective: Seen and examined.  Pain is improving.  No nausea.  No other complaint.  Objective: Vitals:   06/28/21 1445 06/28/21 1632 06/28/21 1944 06/29/21 1287  BP: 107/75 107/70 116/66 (!) 92/50  Pulse: 80 72 78 81  Resp: (!) 21 16 16 19   Temp:  (!) 97.3 F (36.3 C) (!) 97.5 F (36.4 C) 98 F (36.7 C)  TempSrc:  Oral Oral Oral  SpO2: 100% 100% 99% 98%    Intake/Output Summary (Last 24 hours) at 06/29/2021 1200 Last data filed at 06/29/2021 0900 Gross per 24 hour  Intake 240 ml  Output --  Net 240 ml   There were no vitals filed for this visit.  Examination:  General exam: Appears  calm and comfortable, morbidly obese Respiratory system: Clear to auscultation. Respiratory effort normal. Cardiovascular system: S1 & S2 heard, RRR. No JVD, murmurs, rubs, gallops or clicks. No pedal edema. Gastrointestinal system: Abdomen is nondistended, soft and moderate epigastric tenderness. No organomegaly or masses felt. Normal bowel sounds heard. Central nervous system: Alert and oriented. No focal neurological deficits. Extremities: Symmetric 5 x 5 power. Skin: No rashes, lesions or ulcers Psychiatry: Judgement and insight appear normal. Mood & affect appropriate.    Data Reviewed: I have personally reviewed following labs and imaging studies  CBC: Recent Labs  Lab 06/26/21 0138 06/28/21 0605 06/29/21 0526  WBC 13.5* 8.7 9.2  HGB 12.3 11.7* 10.7*  HCT 39.8 37.0 34.2*  MCV 82.7 81.7 82.0  PLT 461* 351 563   Basic Metabolic Panel: Recent Labs  Lab 06/26/21 0138 06/28/21 0605 06/29/21 0526  NA 135 138 135  K 3.6 3.4* 3.7  CL 108 109 107  CO2 19* 19* 19*  GLUCOSE 149* 106* 91  BUN 12 9 <5*  CREATININE 1.07* 0.92 0.82  CALCIUM 8.8* 8.6* 8.5*   GFR: Estimated Creatinine Clearance: 109.2 mL/min (by C-G formula based on SCr of 0.82 mg/dL). Liver Function Tests: Recent Labs  Lab 06/26/21 0138 06/28/21 0605 06/29/21 0526  AST 15 16 17   ALT 20 18 19   ALKPHOS 187* 155* 154*  BILITOT 0.4 0.5 0.6  PROT 7.6 6.7 6.7  ALBUMIN 3.8 3.4* 3.3*   Recent Labs  Lab 06/26/21 0138 06/28/21 0605  LIPASE 288* 284*   No results for input(s): AMMONIA in the last 168 hours. Coagulation Profile: No results for input(s): INR, PROTIME in the last 168 hours. Cardiac Enzymes: No results for input(s): CKTOTAL, CKMB, CKMBINDEX, TROPONINI in the last 168 hours. BNP (last 3 results) No results for input(s): PROBNP in the last 8760 hours. HbA1C: No results for input(s): HGBA1C in the last 72 hours. CBG: No results for input(s): GLUCAP in the last 168 hours. Lipid  Profile: Recent Labs    06/29/21 0526  TRIG 220*   Thyroid Function Tests: No results for input(s): TSH, T4TOTAL, FREET4, T3FREE, THYROIDAB in the last 72 hours. Anemia Panel: No results for input(s): VITAMINB12, FOLATE, FERRITIN, TIBC, IRON, RETICCTPCT in the last 72 hours. Sepsis Labs: No results for input(s): PROCALCITON, LATICACIDVEN in the last 168 hours.  Recent Results (from the past 240 hour(s))  Resp Panel by RT-PCR (Flu A&B, Covid) Nasopharyngeal Swab     Status: None   Collection Time: 06/28/21  4:58 AM   Specimen: Nasopharyngeal Swab; Nasopharyngeal(NP) swabs in vial transport medium  Result Value Ref Range Status   SARS Coronavirus 2 by RT PCR NEGATIVE NEGATIVE Final    Comment: (NOTE) SARS-CoV-2 target nucleic acids are NOT DETECTED.  The SARS-CoV-2 RNA is generally detectable in upper respiratory specimens during the acute phase of infection. The lowest concentration of SARS-CoV-2 viral copies this assay can detect is 138 copies/mL. A  negative result does not preclude SARS-Cov-2 infection and should not be used as the sole basis for treatment or other patient management decisions. A negative result may occur with  improper specimen collection/handling, submission of specimen other than nasopharyngeal swab, presence of viral mutation(s) within the areas targeted by this assay, and inadequate number of viral copies(<138 copies/mL). A negative result must be combined with clinical observations, patient history, and epidemiological information. The expected result is Negative.  Fact Sheet for Patients:  EntrepreneurPulse.com.au  Fact Sheet for Healthcare Providers:  IncredibleEmployment.be  This test is no t yet approved or cleared by the [place] FDA and  has been authorized for detection and/or diagnosis of SARS-CoV-2 by FDA under an Emergency Use Authorization (EUA). This EUA will remain  in effect (meaning this test  can be used) for the duration of the COVID-19 declaration under Section 564(b)(1) of the Act, 21 U.S.C.section 360bbb-3(b)(1), unless the authorization is terminated  or revoked sooner.       Influenza A by PCR NEGATIVE NEGATIVE Final   Influenza B by PCR NEGATIVE NEGATIVE Final    Comment: (NOTE) The Xpert Xpress SARS-CoV-2/FLU/RSV plus assay is intended as an aid in the diagnosis of influenza from Nasopharyngeal swab specimens and should not be used as a sole basis for treatment. Nasal washings and aspirates are unacceptable for Xpert Xpress SARS-CoV-2/FLU/RSV testing.  Fact Sheet for Patients: EntrepreneurPulse.com.au  Fact Sheet for Healthcare Providers: IncredibleEmployment.be  This test is not yet approved or cleared by the [place] FDA and has been authorized for detection and/or diagnosis of SARS-CoV-2 by FDA under an Emergency Use Authorization (EUA). This EUA will remain in effect (meaning this test can be used) for the duration of the COVID-19 declaration under Section 564(b)(1) of the Act, 21 U.S.C. section 360bbb-3(b)(1), unless the authorization is terminated or revoked.  Performed at [facility], [place] [address] [place], [place] [address]       Radiology Studies: No results found.  Scheduled Meds:  acetaZOLAMIDE ER  500 mg Oral TID   enoxaparin (LOVENOX) injection  40 mg Subcutaneous Q24H   OLANZapine-FLUoxetine  2 capsule Oral QHS   pantoprazole  40 mg Oral QHS   vortioxetine HBr  10 mg Oral QHS   Continuous Infusions:  sodium chloride 125 mL/hr at 06/29/21 0957     LOS: 0 days   Time spent: 33 minutes   Darliss Cheney, MD [facility]  06/29/2021, 12:00 PM  Please page via [place] and do not message via secure chat for anything urgent. Secure chat can be used for anything non urgent.  How to contact the [facility] Attending or Consulting provider [place] or covering provider during after hours [place], for this patient?  Check the care team in [facility] and look for a) attending/consulting [facility] provider listed and b) the [facility] team listed. Page or secure chat [phone]. Log into [email or web address] and use [place]'s universal password to access. If you do not have the password, please contact the hospital operator. Locate the [facility] provider you are looking for under [facility] and page to a number that you can be directly reached. If you still have difficulty reaching the provider, please page the [facility] (Director on Call) for the Hospitalists listed on amion for assistance.

## 2021-06-29 NOTE — Plan of Care (Signed)
°  Problem: Education: Goal: Knowledge of General Education information will improve Description: Including pain rating scale, medication(s)/side effects and non-pharmacologic comfort measures Outcome: Progressing   Problem: Clinical Measurements: Goal: Ability to maintain clinical measurements within normal limits will improve Outcome: Progressing Goal: Will remain free from infection Outcome: Progressing   Problem: Activity: Goal: Risk for activity intolerance will decrease Outcome: Progressing   Problem: Nutrition: Goal: Adequate nutrition will be maintained Outcome: Progressing   Problem: Coping: Goal: Level of anxiety will decrease Outcome: Progressing   Problem: Pain Managment: Goal: General experience of comfort will improve Outcome: Progressing

## 2021-06-30 NOTE — Progress Notes (Signed)
PROGRESS NOTE    Diana Page  QMG:[id number] DOB: [birth year] DOA: 06/28/2021 PCP: Pcp, No   Brief Narrative:  Diana Page is a 31 y.o. female with medical history significant of ulcerative colitis s/p total colectomy with end ileostomy and proctectomy, pancreatitis, s/p cholecystectomy, OCD, depression, and obesity presented with complaints of abdominal pain.  Symptoms started on 12/26 after she had eaten sushi. Noted associated symptoms of nausea, vomiting x2, and increased watery output from her ostomy.  She was seen in the emergency department on 12/31 due to pain and had been evaluated with a CT scan of the abdomen and pelvis that did not show any signs of any acute abnormality.  At that time labs noted WBC elevated at 13.5, platelets 461, and lipase 288.  She had been treated with 1 L normal saline IV fluids and morphine IV.  Ultimately patient had been discharged home and had been offered Vicodin, but reports declining this medication as she thought she could manage symptoms with Tylenol.  However, since getting home patient reports that she has had no more episodes of vomiting so she returned to the ED. she sees Dr. Benson Norway as outpatient.  Upon admission to ED, she was hemodynamically stable.   Assessment & Plan:   Principal Problem:   Pancreatitis Active Problems:   Ulcerative colitis (HCC)   OCD (obsessive compulsive disorder)   Obesity   Depression   Hypokalemia   Epigastric pain   Acute pancreatitis  Possible acute pancreatitis: CT abdomen negative for acute pancreatitis but she did have elevated lipase level, at least 3 times more than normal.  She is feeling better.  Pain improving.  She is tolerating clears.  However reportedly, she had abdominal bloating when she tried something soft.  This morning she was telling me that she believes that happened because she tried some dairy product and that she is feeling better today and would like to try something soft, nondairy in the  lunch and would prefer to go home if she feels well but per reports from nurses, she is still bloating after lunch and does not feel like going home today.  Advance diet slowly.  GI has recommended that.  Hypokalemia: Resolved.  History of ulcerative colitis s/p colectomy: Stable.  GI on board.  GI does not believe she has ulcerative colitis however patient continues to suspect that.   Metabolic acidosis: Chronic.  CO2 19 without elevated anion gap on admission. -Continue acetazolamide   Normocytic anemia: Acute.  Hemoglobin 11.7 g/dL which appears just slightly lower than previous baseline of 12-13 g/dL.  It further dropped to 10.7 today.  Patient did report passing a small amount of blood clots in stool yesterday evening.  GI on board.   GERD -Continue Protonix   Obesity: BMI 39.36 kg/m.  Weight loss counseling provided.    DVT prophylaxis: enoxaparin (LOVENOX) injection 40 mg Start: 06/28/21 1100   Code Status: Full Code  Family Communication:  None present at bedside.  Plan of care discussed with patient in length and he verbalized understanding and agreed with it.  Status is: Inpatient  Remains inpatient appropriate because: Advancing diet slowly.   Estimated body mass index is 39.36 kg/m as calculated from the following:   Page as of 03/24/21: 5' 2.4" (1.585 m).   Weight as of 06/26/21: 98.9 kg.  Nutritional Assessment: There is no Page or weight on file to calculate BMI.. Seen by dietician.  I agree with the assessment and plan as outlined below: Nutrition  Status:   Skin Assessment: I have examined the patient's skin and I agree with the wound assessment as performed by the wound care RN as outlined below:    Consultants:  GI  Procedures:  None  Antimicrobials:  Anti-infectives (From admission, onward)    None          Subjective:  Seen and examined, she stated that her abdominal pain is improving.  Rest as mentioned above.  Objective: Vitals:    06/29/21 0738 06/29/21 1454 06/29/21 2022 06/30/21 1159  BP: (!) 92/50 107/67 112/67 116/62  Pulse: 81 93 88 70  Resp: 19 18 17 16   Temp: 98 F (36.7 C) 97.7 F (36.5 C) 98.3 F (36.8 C) 97.8 F (36.6 C)  TempSrc: Oral Oral Oral Oral  SpO2: 98% 97% 99% 98%    Intake/Output Summary (Last 24 hours) at 06/30/2021 1532 Last data filed at 06/30/2021 0448 Gross per 24 hour  Intake 480 ml  Output --  Net 480 ml    There were no vitals filed for this visit.  Examination:  General exam: Appears calm and comfortable, morbidly obese Respiratory system: Clear to auscultation. Respiratory effort normal. Cardiovascular system: S1 & S2 heard, RRR. No JVD, murmurs, rubs, gallops or clicks. No pedal edema. Gastrointestinal system: Abdomen is nondistended, soft and mild epigastric tenderness. No organomegaly or masses felt. Normal bowel sounds heard. Central nervous system: Alert and oriented. No focal neurological deficits. Extremities: Symmetric 5 x 5 power. Skin: No rashes, lesions or ulcers.  Psychiatry: Judgement and insight appear normal. Mood & affect appropriate.   Data Reviewed: I have personally reviewed following labs and imaging studies  CBC: Recent Labs  Lab 06/26/21 0138 06/28/21 0605 06/29/21 0526  WBC 13.5* 8.7 9.2  HGB 12.3 11.7* 10.7*  HCT 39.8 37.0 34.2*  MCV 82.7 81.7 82.0  PLT 461* 351 170    Basic Metabolic Panel: Recent Labs  Lab 06/26/21 0138 06/28/21 0605 06/29/21 0526  NA 135 138 135  K 3.6 3.4* 3.7  CL 108 109 107  CO2 19* 19* 19*  GLUCOSE 149* 106* 91  BUN 12 9 <5*  CREATININE 1.07* 0.92 0.82  CALCIUM 8.8* 8.6* 8.5*    GFR: Estimated Creatinine Clearance: 109.2 mL/min (by C-G formula based on SCr of 0.82 mg/dL). Liver Function Tests: Recent Labs  Lab 06/26/21 0138 06/28/21 0605 06/29/21 0526  AST 15 16 17   ALT 20 18 19   ALKPHOS 187* 155* 154*  BILITOT 0.4 0.5 0.6  PROT 7.6 6.7 6.7  ALBUMIN 3.8 3.4* 3.3*    Recent Labs  Lab  06/26/21 0138 06/28/21 0605  LIPASE 288* 284*    No results for input(s): AMMONIA in the last 168 hours. Coagulation Profile: No results for input(s): INR, PROTIME in the last 168 hours. Cardiac Enzymes: No results for input(s): CKTOTAL, CKMB, CKMBINDEX, TROPONINI in the last 168 hours. BNP (last 3 results) No results for input(s): PROBNP in the last 8760 hours. HbA1C: No results for input(s): HGBA1C in the last 72 hours. CBG: No results for input(s): GLUCAP in the last 168 hours. Lipid Profile: Recent Labs    06/29/21 0526  TRIG 220*    Thyroid Function Tests: No results for input(s): TSH, T4TOTAL, FREET4, T3FREE, THYROIDAB in the last 72 hours. Anemia Panel: No results for input(s): VITAMINB12, FOLATE, FERRITIN, TIBC, IRON, RETICCTPCT in the last 72 hours. Sepsis Labs: No results for input(s): PROCALCITON, LATICACIDVEN in the last 168 hours.  Recent Results (from the past 240 hour(s))  Resp Panel by RT-PCR (Flu A&B, Covid) Nasopharyngeal Swab     Status: None   Collection Time: 06/28/21  4:58 AM   Specimen: Nasopharyngeal Swab; Nasopharyngeal(NP) swabs in vial transport medium  Result Value Ref Range Status   SARS Coronavirus 2 by RT PCR NEGATIVE NEGATIVE Final    Comment: (NOTE) SARS-CoV-2 target nucleic acids are NOT DETECTED.  The SARS-CoV-2 RNA is generally detectable in upper respiratory specimens during the acute phase of infection. The lowest concentration of SARS-CoV-2 viral copies this assay can detect is 138 copies/mL. A negative result does not preclude SARS-Cov-2 infection and should not be used as the sole basis for treatment or other patient management decisions. A negative result may occur with  improper specimen collection/handling, submission of specimen other than nasopharyngeal swab, presence of viral mutation(s) within the areas targeted by this assay, and inadequate number of viral copies(<138 copies/mL). A negative result must be combined  with clinical observations, patient history, and epidemiological information. The expected result is Negative.  Fact Sheet for Patients:  EntrepreneurPulse.com.au  Fact Sheet for Healthcare Providers:  IncredibleEmployment.be  This test is no t yet approved or cleared by the [place] FDA and  has been authorized for detection and/or diagnosis of SARS-CoV-2 by FDA under an Emergency Use Authorization (EUA). This EUA will remain  in effect (meaning this test can be used) for the duration of the COVID-19 declaration under Section 564(b)(1) of the Act, 21 U.S.C.section 360bbb-3(b)(1), unless the authorization is terminated  or revoked sooner.       Influenza A by PCR NEGATIVE NEGATIVE Final   Influenza B by PCR NEGATIVE NEGATIVE Final    Comment: (NOTE) The Xpert Xpress SARS-CoV-2/FLU/RSV plus assay is intended as an aid in the diagnosis of influenza from Nasopharyngeal swab specimens and should not be used as a sole basis for treatment. Nasal washings and aspirates are unacceptable for Xpert Xpress SARS-CoV-2/FLU/RSV testing.  Fact Sheet for Patients: EntrepreneurPulse.com.au  Fact Sheet for Healthcare Providers: IncredibleEmployment.be  This test is not yet approved or cleared by the [place] FDA and has been authorized for detection and/or diagnosis of SARS-CoV-2 by FDA under an Emergency Use Authorization (EUA). This EUA will remain in effect (meaning this test can be used) for the duration of the COVID-19 declaration under Section 564(b)(1) of the Act, 21 U.S.C. section 360bbb-3(b)(1), unless the authorization is terminated or revoked.  Performed at [facility], [place] [address], [place], [place] [address]        Radiology Studies: No results found.  Scheduled Meds:  acetaZOLAMIDE ER  500 mg Oral TID   enoxaparin (LOVENOX) injection  40 mg Subcutaneous Q24H    OLANZapine-FLUoxetine  2 capsule Oral QHS   pantoprazole  40 mg Oral QHS   vortioxetine HBr  10 mg Oral QHS   Continuous Infusions:  sodium chloride 125 mL/hr at 06/30/21 0534     LOS: 1 day   Time spent: 30 minutes   Darliss Cheney, MD [facility]  06/30/2021, 3:32 PM  Please page via [place] and do not message via secure chat for anything urgent. Secure chat can be used for anything non urgent.  How to contact the [facility] Attending or Consulting provider [place] or covering provider during after hours [place], for this patient?  Check the care team in [facility] and look for a) attending/consulting [facility] provider listed and b) the [facility] team listed. Page or secure chat [phone]. Log into [email or web address] and use [place]'s  universal password to access. If you do not have the password, please contact the hospital operator. Locate the [facility] provider you are looking for under [facility] and page to a number that you can be directly reached. If you still have difficulty reaching the provider, please page the [facility] (Director on Call) for the Hospitalists listed on amion for assistance.

## 2021-06-30 NOTE — Progress Notes (Signed)
Subjective: No new complaints.  The pain is stable.  Objective: Vital signs in last 24 hours: Temp:  [97.7 F (36.5 C)-98.3 F (36.8 C)] 98.3 F (36.8 C) (01/03 2022) Pulse Rate:  [81-93] 88 (01/03 2022) Resp:  [17-19] 17 (01/03 2022) BP: (92-112)/(50-67) 112/67 (01/03 2022) SpO2:  [97 %-99 %] 99 % (01/03 2022) Last BM Date: 06/29/20  Intake/Output from previous day: 01/03 0701 - 01/04 0700 In: 960 [P.O.:960] Out: -  Intake/Output this shift: No intake/output data recorded.  General appearance: alert and no distress GI: tender in the upper abdomen, unchanged from yesterday  Lab Results: Recent Labs    06/28/21 0605 06/29/21 0526  WBC 8.7 9.2  HGB 11.7* 10.7*  HCT 37.0 34.2*  PLT 351 325   BMET Recent Labs    06/28/21 0605 06/29/21 0526  NA 138 135  K 3.4* 3.7  CL 109 107  CO2 19* 19*  GLUCOSE 106* 91  BUN 9 <5*  CREATININE 0.92 0.82  CALCIUM 8.6* 8.5*   LFT Recent Labs    06/29/21 0526  PROT 6.7  ALBUMIN 3.3*  AST 17  ALT 19  ALKPHOS 154*  BILITOT 0.6   PT/INR No results for input(s): LABPROT, INR in the last 72 hours. Hepatitis Panel No results for input(s): HEPBSAG, HCVAB, HEPAIGM, HEPBIGM in the last 72 hours. C-Diff No results for input(s): CDIFFTOX in the last 72 hours. Fecal Lactopherrin No results for input(s): FECLLACTOFRN in the last 72 hours.  Studies/Results: No results found.  Medications: Scheduled:  acetaZOLAMIDE ER  500 mg Oral TID   enoxaparin (LOVENOX) injection  40 mg Subcutaneous Q24H   OLANZapine-FLUoxetine  2 capsule Oral QHS   pantoprazole  40 mg Oral QHS   vortioxetine HBr  10 mg Oral QHS   Continuous:  sodium chloride 125 mL/hr at 06/30/21 0534    Assessment/Plan: 1) Acute pancreatitis. 2) GERD.   Clinically she is stable.  There is no significant change.  She will maintain her clear liquids and to advance as she can tolerate.  Plan: 1) Continue with supportive care.  LOS: 1 day   Shereda Graw  D 06/30/2021, 7:16 AM

## 2021-07-01 LAB — BASIC METABOLIC PANEL
Anion gap: 4 — ABNORMAL LOW (ref 5–15)
BUN: <5 mg/dL — ABNORMAL LOW (ref 6–20)
CO2: 20 mmol/L — ABNORMAL LOW (ref 22–32)
Calcium: 8.2 mg/dL — ABNORMAL LOW (ref 8.9–10.3)
Chloride: 110 mmol/L (ref 98–111)
Creatinine, Ser: 0.77 mg/dL (ref 0.44–1.00)
GFR, Estimated: >60 mL/min (ref >60)
Glucose, Bld: 147 mg/dL — ABNORMAL HIGH (ref 70–99)
Potassium: 3.3 mmol/L — ABNORMAL LOW (ref 3.5–5.1)
Sodium: 134 mmol/L — ABNORMAL LOW (ref 135–145)

## 2021-07-01 LAB — CBC WITH DIFFERENTIAL/PLATELET
Abs Immature Granulocytes: 0.04 10*3/uL (ref 0.00–0.07)
Basophils Absolute: 0.1 10*3/uL (ref 0.0–0.1)
Basophils Relative: 1 %
Eosinophils Absolute: 0.3 10*3/uL (ref 0.0–0.5)
Eosinophils Relative: 3 %
HCT: 33.1 % — ABNORMAL LOW (ref 36.0–46.0)
Hemoglobin: 10 g/dL — ABNORMAL LOW (ref 12.0–15.0)
Immature Granulocytes: 1 %
Lymphocytes Relative: 29 %
Lymphs Abs: 2.2 10*3/uL (ref 0.7–4.0)
MCH: 25.3 pg — ABNORMAL LOW (ref 26.0–34.0)
MCHC: 30.2 g/dL (ref 30.0–36.0)
MCV: 83.8 fL (ref 80.0–100.0)
Monocytes Absolute: 0.4 10*3/uL (ref 0.1–1.0)
Monocytes Relative: 5 %
Neutro Abs: 4.7 10*3/uL (ref 1.7–7.7)
Neutrophils Relative %: 61 %
Platelets: 288 10*3/uL (ref 150–400)
RBC: 3.95 MIL/uL (ref 3.87–5.11)
RDW: 13.2 % (ref 11.5–15.5)
WBC: 7.6 10*3/uL (ref 4.0–10.5)
nRBC: 0 % (ref 0.0–0.2)

## 2021-07-01 LAB — MAGNESIUM: Magnesium: 2 mg/dL (ref 1.7–2.4)

## 2021-07-01 LAB — LIPID PANEL
Cholesterol: 171 mg/dL (ref 0–200)
HDL: 32 mg/dL — ABNORMAL LOW (ref >40)
LDL Cholesterol: 100 mg/dL — ABNORMAL HIGH (ref 0–99)
Total CHOL/HDL Ratio: 5.3 RATIO
Triglycerides: 193 mg/dL — ABNORMAL HIGH (ref <150)
VLDL: 39 mg/dL (ref 0–40)

## 2021-07-01 MED ORDER — HYDROCODONE-ACETAMINOPHEN 5-325 MG PO TABS: 1.0000 | ORAL_TABLET | Freq: Four times a day (QID) | ORAL | 0 refills | Status: DC | PRN

## 2021-07-01 MED ORDER — POTASSIUM CHLORIDE CRYS ER 20 MEQ PO TBCR
40.0000 meq | EXTENDED_RELEASE_TABLET | Freq: Once | ORAL | Status: AC
  Administered 2021-07-01: 40 meq via ORAL
  Filled 2021-07-01: qty 2

## 2021-07-01 NOTE — Progress Notes (Signed)
Potassium 3.3, provider made aware, PO potassium given. Patient transported to the lobby for discharge.

## 2021-07-01 NOTE — Discharge Summary (Signed)
Physician Discharge Summary  Diana Page FUX:[id number] DOB: Nov 21, 1990 DOA: 06/28/2021  PCP: Pcp, No  Admit date: 06/28/2021 Discharge date: 07/01/2021 30 Day Unplanned Readmission Risk Score    Flowsheet Row ED to Hosp-Admission (Current) from 06/28/2021 in [facility]  30 Day Unplanned Readmission Risk Score (%) 11.41 Filed at 07/01/2021 0801       This score is the patient's risk of an unplanned readmission within 30 days of being discharged (0 -100%). The score is based on dignosis, age, lab data, medications, orders, and past utilization.   Low:  0-14.9   Medium: 15-21.9   High: 22-29.9   Extreme: 30 and above          Admitted From: Home Disposition: Home  Recommendations for Outpatient Follow-up:  Follow up with PCP in 1-2 weeks Please obtain BMP/CBC in one week Follow-up with your primary GI as needed Please follow up with your PCP on the following pending results: Unresulted Labs (From admission, onward)     Start     Ordered   07/01/21 0808  Lipid panel  Once,   R       Question:  Specimen collection method  Answer:  Lab=Lab collect   07/01/21 0807   07/01/21 0254  Basic metabolic panel  Once,   R       Question:  Specimen collection method  Answer:  Lab=Lab collect   07/01/21 0807   07/01/21 0808  CBC with Differential/Platelet  Once,   R       Question:  Specimen collection method  Answer:  Lab=Lab collect   07/01/21 0807   07/01/21 0808  Magnesium  Once,   R       Question:  Specimen collection method  Answer:  Lab=Lab collect   07/01/21 0807              Home Health: None Equipment/Devices: None  Discharge Condition: Stable CODE STATUS: Full code Diet recommendation: Soft and then advance to regular as tolerated  Subjective: Seen and examined.  Pain very minimal.  Tolerated soft diet this morning.  Willing to go home today.  Brief/Interim Summary: Diana Page is a 31 y.o. female with medical history  significant of ulcerative colitis s/p total colectomy with end ileostomy and proctectomy, pancreatitis, s/p cholecystectomy, OCD, depression, and obesity presented with complaints of abdominal pain.  Symptoms started on 12/26 after she had eaten sushi. Noted associated symptoms of nausea, vomiting x2, and increased watery output from her ostomy.  She was seen in the emergency department on 12/31 due to pain and had been evaluated with a CT scan of the abdomen and pelvis that did not show any signs of any acute abnormality.  At that time labs noted WBC elevated at 13.5, platelets 461, and lipase 288.  She had been treated with 1 L normal saline IV fluids and morphine IV.  Ultimately patient had been discharged home and had been offered Vicodin, but reports declining this medication as she thought she could manage symptoms with Tylenol.  However, since getting home patient reports that she has had no more episodes of vomiting so she returned to the ED. she sees Dr. Benson Norway as outpatient.  Upon admission to ED, she was hemodynamically stable.  Detailed hospitalization as below.    Possible acute pancreatitis: CT abdomen negative for acute pancreatitis but she did have elevated lipase level, at least 3 times more than normal.  This was managed conservatively with bowel rest  and n.p.o. initially, pain improved with pain medications, diet advanced to liquid and then to full liquid diet, she had a setback so diet was de-escalated but eventually she is now able to tolerate soft diet, she was seen by Dr. Benson Norway and she has been cleared for discharge.  GI has no concerns of acute ulcerative colitis at this point in time.  She is being discharged home in stable condition.   Hypokalemia: Low again, will replace today before discharge.   History of ulcerative colitis s/p colectomy: Stable.  GI on board.  GI does not believe she has ulcerative colitis however patient continues to suspect that.   Metabolic acidosis: Chronic.      Normocytic anemia: Acute.  Hemoglobin 11.7 g/dL which appears just slightly lower than previous baseline of 12-13 g/dL.  Hemoglobin dropped slowly to 10 today.  No indication of transfusion.   GERD -Continue Protonix   Obesity: BMI 39.36 kg/m.  Weight loss counseling provided.    Discharge Diagnoses:  Principal Problem:   Pancreatitis Active Problems:   Ulcerative colitis ([place])   OCD (obsessive compulsive disorder)   Obesity   Depression   Hypokalemia   Epigastric pain   Acute pancreatitis    Discharge Instructions   Allergies as of 07/01/2021       Reactions   Nsaids Other (See Comments)   Cannot have because of her Ulcerative Colitis   Tolmetin Other (See Comments)   Cannot have because of her Ulcerative Colitis        Medication List     TAKE these medications    acetaminophen 500 MG tablet Commonly known as: TYLENOL Take 500-1,000 mg by mouth every 6 (six) hours as needed (for pain).   acetaZOLAMIDE ER 500 MG capsule Commonly known as: DIAMOX Take 500-1,000 mg by mouth 2 (two) times daily. Pt takes 1000 mg at bedtime and 500 mg in the AM   ALPRAZolam 0.5 MG tablet Commonly known as: XANAX as needed.   Drainable Fecal Collector Misc by Does not apply route.   OLANZapine-FLUoxetine 6-50 MG capsule Commonly known as: SYMBYAX Take 1 capsule by mouth daily.   ondansetron 4 MG disintegrating tablet Commonly known as: ZOFRAN-ODT Take 4 mg by mouth 3 (three) times daily.   Ostomy Supplies Misc by Does not apply route.   pantoprazole 40 MG tablet Commonly known as: PROTONIX Take 40 mg by mouth daily.   phentermine 37.5 MG tablet Commonly known as: ADIPEX-P Take 37.5 mg by mouth daily.   vortioxetine HBr 10 MG Tabs tablet Commonly known as: Trintellix Take 1 tablet (10 mg total) by mouth daily.        Allergies  Allergen Reactions   Nsaids Other (See Comments)    Cannot have because of her Ulcerative Colitis   Tolmetin Other (See  Comments)    Cannot have because of her Ulcerative Colitis    Consultations: GI   Procedures/Studies: CT ABDOMEN PELVIS W CONTRAST  Result Date: 06/26/2021 CLINICAL DATA:  Acute epigastric abdominal pain. EXAM: CT ABDOMEN AND PELVIS WITH CONTRAST TECHNIQUE: Multidetector CT imaging of the abdomen and pelvis was performed using the standard protocol following bolus administration of intravenous contrast. CONTRAST:  34m OMNIPAQUE IOHEXOL 300 MG/ML  SOLN COMPARISON:  June 28, 2018. FINDINGS: Lower chest: No acute abnormality. Hepatobiliary: Status post cholecystectomy. No biliary dilatation is noted. No focal hepatic abnormality is noted. Hepatomegaly is again noted. Pancreas: Unremarkable. No pancreatic ductal dilatation or surrounding inflammatory changes. Spleen: Stable mild splenomegaly. Adrenals/Urinary Tract:  Adrenal glands are unremarkable. Kidneys are normal, without renal calculi, focal lesion, or hydronephrosis. Bladder is unremarkable. Stomach/Bowel: The stomach appears normal. Status post colectomy. Ileostomy is noted in right lower quadrant. No evidence of bowel obstruction or inflammation is noted. Vascular/Lymphatic: No significant vascular findings are present. No enlarged abdominal or pelvic lymph nodes. Reproductive: Uterus and bilateral adnexa are unremarkable. Other: No abdominal wall hernia or abnormality. No abdominopelvic ascites. Musculoskeletal: No acute or significant osseous findings. IMPRESSION: No acute abnormality seen in the abdomen or pelvis. Electronically Signed   By: Marijo Conception M.D.   On: 06/26/2021 11:44     Discharge Exam: Vitals:   06/30/21 2100 07/01/21 0751  BP: 121/69 107/80  Pulse: 80 92  Resp: 17 18  Temp: 98.1 F (36.7 C) 97.7 F (36.5 C)  SpO2: 100% 100%   Vitals:   06/29/21 2022 06/30/21 1159 06/30/21 2100 07/01/21 0751  BP: 112/67 116/62 121/69 107/80  Pulse: 88 70 80 92  Resp: 17 16 17 18   Temp: 98.3 F (36.8 C) 97.8 F (36.6 C)  98.1 F (36.7 C) 97.7 F (36.5 C)  TempSrc: Oral Oral Oral Oral  SpO2: 99% 98% 100% 100%    General: Pt is alert, awake, not in acute distress, morbidly obese Cardiovascular: RRR, S1/S2 +, no rubs, no gallops Respiratory: CTA bilaterally, no wheezing, no rhonchi Abdominal: Soft, very minimal epigastric tenderness, ND, bowel sounds + Extremities: no edema, no cyanosis    The results of significant diagnostics from this hospitalization (including imaging, microbiology, ancillary and laboratory) are listed below for reference.     Microbiology: Recent Results (from the past 240 hour(s))  Resp Panel by RT-PCR (Flu A&B, Covid) Nasopharyngeal Swab     Status: None   Collection Time: 06/28/21  4:58 AM   Specimen: Nasopharyngeal Swab; Nasopharyngeal(NP) swabs in vial transport medium  Result Value Ref Range Status   SARS Coronavirus 2 by RT PCR NEGATIVE NEGATIVE Final    Comment: (NOTE) SARS-CoV-2 target nucleic acids are NOT DETECTED.  The SARS-CoV-2 RNA is generally detectable in upper respiratory specimens during the acute phase of infection. The lowest concentration of SARS-CoV-2 viral copies this assay can detect is 138 copies/mL. A negative result does not preclude SARS-Cov-2 infection and should not be used as the sole basis for treatment or other patient management decisions. A negative result may occur with  improper specimen collection/handling, submission of specimen other than nasopharyngeal swab, presence of viral mutation(s) within the areas targeted by this assay, and inadequate number of viral copies(<138 copies/mL). A negative result must be combined with clinical observations, patient history, and epidemiological information. The expected result is Negative.  Fact Sheet for Patients:  EntrepreneurPulse.com.au  Fact Sheet for Healthcare Providers:  IncredibleEmployment.be  This test is no t yet approved or cleared by the  [place] FDA and  has been authorized for detection and/or diagnosis of SARS-CoV-2 by FDA under an Emergency Use Authorization (EUA). This EUA will remain  in effect (meaning this test can be used) for the duration of the COVID-19 declaration under Section 564(b)(1) of the Act, 21 U.S.C.section 360bbb-3(b)(1), unless the authorization is terminated  or revoked sooner.       Influenza A by PCR NEGATIVE NEGATIVE Final   Influenza B by PCR NEGATIVE NEGATIVE Final    Comment: (NOTE) The Xpert Xpress SARS-CoV-2/FLU/RSV plus assay is intended as an aid in the diagnosis of influenza from Nasopharyngeal swab specimens and should not be used as a sole basis  for treatment. Nasal washings and aspirates are unacceptable for Xpert Xpress SARS-CoV-2/FLU/RSV testing.  Fact Sheet for Patients: EntrepreneurPulse.com.au  Fact Sheet for Healthcare Providers: IncredibleEmployment.be  This test is not yet approved or cleared by the [place] FDA and has been authorized for detection and/or diagnosis of SARS-CoV-2 by FDA under an Emergency Use Authorization (EUA). This EUA will remain in effect (meaning this test can be used) for the duration of the COVID-19 declaration under Section 564(b)(1) of the Act, 21 U.S.C. section 360bbb-3(b)(1), unless the authorization is terminated or revoked.  Performed at [facility], [place] [address] [place], [place] [address]      Labs: BNP (last 3 results) No results for input(s): BNP in the last 8760 hours. Basic Metabolic Panel: Recent Labs  Lab 06/26/21 0138 06/28/21 0605 06/29/21 0526  NA 135 138 135  K 3.6 3.4* 3.7  CL 108 109 107  CO2 19* 19* 19*  GLUCOSE 149* 106* 91  BUN 12 9 <5*  CREATININE 1.07* 0.92 0.82  CALCIUM 8.8* 8.6* 8.5*   Liver Function Tests: Recent Labs  Lab 06/26/21 0138 06/28/21 0605 06/29/21 0526  AST 15 16 17   ALT 20 18 19   ALKPHOS 187* 155* 154*  BILITOT 0.4 0.5 0.6   PROT 7.6 6.7 6.7  ALBUMIN 3.8 3.4* 3.3*   Recent Labs  Lab 06/26/21 0138 06/28/21 0605  LIPASE 288* 284*   No results for input(s): AMMONIA in the last 168 hours. CBC: Recent Labs  Lab 06/26/21 0138 06/28/21 0605 06/29/21 0526  WBC 13.5* 8.7 9.2  HGB 12.3 11.7* 10.7*  HCT 39.8 37.0 34.2*  MCV 82.7 81.7 82.0  PLT 461* 351 325   Cardiac Enzymes: No results for input(s): CKTOTAL, CKMB, CKMBINDEX, TROPONINI in the last 168 hours. BNP: Invalid input(s): POCBNP CBG: No results for input(s): GLUCAP in the last 168 hours. D-Dimer No results for input(s): DDIMER in the last 72 hours. Hgb A1c No results for input(s): HGBA1C in the last 72 hours. Lipid Profile Recent Labs    06/29/21 0526  TRIG 220*   Thyroid function studies No results for input(s): TSH, T4TOTAL, T3FREE, THYROIDAB in the last 72 hours.  Invalid input(s): FREET3 Anemia work up No results for input(s): VITAMINB12, FOLATE, FERRITIN, TIBC, IRON, RETICCTPCT in the last 72 hours. Urinalysis    Component Value Date/Time   COLORURINE YELLOW 06/26/2021 0132   APPEARANCEUR HAZY (A) 06/26/2021 0132   LABSPEC 1.005 06/26/2021 0132   PHURINE 5.0 06/26/2021 0132   GLUCOSEU NEGATIVE 06/26/2021 0132   HGBUR NEGATIVE 06/26/2021 0132   BILIRUBINUR NEGATIVE 06/26/2021 0132   KETONESUR NEGATIVE 06/26/2021 0132   PROTEINUR NEGATIVE 06/26/2021 0132   UROBILINOGEN 0.2 12/27/2014 0003   NITRITE NEGATIVE 06/26/2021 0132   LEUKOCYTESUR NEGATIVE 06/26/2021 0132   Sepsis Labs Invalid input(s): PROCALCITONIN,  WBC,  LACTICIDVEN Microbiology Recent Results (from the past 240 hour(s))  Resp Panel by RT-PCR (Flu A&B, Covid) Nasopharyngeal Swab     Status: None   Collection Time: 06/28/21  4:58 AM   Specimen: Nasopharyngeal Swab; Nasopharyngeal(NP) swabs in vial transport medium  Result Value Ref Range Status   SARS Coronavirus 2 by RT PCR NEGATIVE NEGATIVE Final    Comment: (NOTE) SARS-CoV-2 target nucleic acids are NOT  DETECTED.  The SARS-CoV-2 RNA is generally detectable in upper respiratory specimens during the acute phase of infection. The lowest concentration of SARS-CoV-2 viral copies this assay can detect is 138 copies/mL. A negative result does not preclude SARS-Cov-2 infection and  should not be used as the sole basis for treatment or other patient management decisions. A negative result may occur with  improper specimen collection/handling, submission of specimen other than nasopharyngeal swab, presence of viral mutation(s) within the areas targeted by this assay, and inadequate number of viral copies(<138 copies/mL). A negative result must be combined with clinical observations, patient history, and epidemiological information. The expected result is Negative.  Fact Sheet for Patients:  EntrepreneurPulse.com.au  Fact Sheet for Healthcare Providers:  IncredibleEmployment.be  This test is no t yet approved or cleared by the [place] FDA and  has been authorized for detection and/or diagnosis of SARS-CoV-2 by FDA under an Emergency Use Authorization (EUA). This EUA will remain  in effect (meaning this test can be used) for the duration of the COVID-19 declaration under Section 564(b)(1) of the Act, 21 U.S.C.section 360bbb-3(b)(1), unless the authorization is terminated  or revoked sooner.       Influenza A by PCR NEGATIVE NEGATIVE Final   Influenza B by PCR NEGATIVE NEGATIVE Final    Comment: (NOTE) The Xpert Xpress SARS-CoV-2/FLU/RSV plus assay is intended as an aid in the diagnosis of influenza from Nasopharyngeal swab specimens and should not be used as a sole basis for treatment. Nasal washings and aspirates are unacceptable for Xpert Xpress SARS-CoV-2/FLU/RSV testing.  Fact Sheet for Patients: EntrepreneurPulse.com.au  Fact Sheet for Healthcare Providers: IncredibleEmployment.be  This test is not yet  approved or cleared by the [place] FDA and has been authorized for detection and/or diagnosis of SARS-CoV-2 by FDA under an Emergency Use Authorization (EUA). This EUA will remain in effect (meaning this test can be used) for the duration of the COVID-19 declaration under Section 564(b)(1) of the Act, 21 U.S.C. section 360bbb-3(b)(1), unless the authorization is terminated or revoked.  Performed at [facility], [place] [address], [place], [place] [address]      Time coordinating discharge: Over 30 minutes  SIGNED:   Darliss Cheney, MD  [facility] 07/01/2021, 9:31 AM  If 7PM-7AM, please contact night-coverage [email or web address]

## 2021-07-01 NOTE — Progress Notes (Signed)
Subjective: No new complaints.  Abdominal pain is better.  Objective: Vital signs in last 24 hours: Temp:  [97.8 F (36.6 C)-98.1 F (36.7 C)] 98.1 F (36.7 C) (01/04 2100) Pulse Rate:  [70-80] 80 (01/04 2100) Resp:  [16-17] 17 (01/04 2100) BP: (116-121)/(62-69) 121/69 (01/04 2100) SpO2:  [98 %-100 %] 100 % (01/04 2100) Last BM Date: 06/30/21  Intake/Output from previous day: 01/04 0701 - 01/05 0700 In: 3000 [I.V.:3000] Out: -  Intake/Output this shift: No intake/output data recorded.  General appearance: alert and no distress GI: tender in the epigastric region.  Lab Results: Recent Labs    06/29/21 0526  WBC 9.2  HGB 10.7*  HCT 34.2*  PLT 325   BMET Recent Labs    06/29/21 0526  NA 135  K 3.7  CL 107  CO2 19*  GLUCOSE 91  BUN <5*  CREATININE 0.82  CALCIUM 8.5*   LFT Recent Labs    06/29/21 0526  PROT 6.7  ALBUMIN 3.3*  AST 17  ALT 19  ALKPHOS 154*  BILITOT 0.6   PT/INR No results for input(s): LABPROT, INR in the last 72 hours. Hepatitis Panel No results for input(s): HEPBSAG, HCVAB, HEPAIGM, HEPBIGM in the last 72 hours. C-Diff No results for input(s): CDIFFTOX in the last 72 hours. Fecal Lactopherrin No results for input(s): FECLLACTOFRN in the last 72 hours.  Studies/Results: No results found.  Medications: Scheduled:  acetaZOLAMIDE ER  500 mg Oral TID   enoxaparin (LOVENOX) injection  40 mg Subcutaneous Q24H   OLANZapine-FLUoxetine  2 capsule Oral QHS   pantoprazole  40 mg Oral QHS   vortioxetine HBr  10 mg Oral QHS   Continuous:  sodium chloride 125 mL/hr at 06/30/21 0534    Assessment/Plan: 1) Presumed acute pancreatitis. 2) GERD.   She is better, but she does continue to have abdominal pain.  She was able to tolerate some solid food last evening and she wants to try this AM.  Plan: 1) If she tolerates her meal this AM she can potentially be D/C'ed home. 2) Follow up in the office in 2 weeks.  LOS: 2 days    Diana Page D 07/01/2021, 7:05 AM

## 2021-07-01 NOTE — Progress Notes (Signed)
Patient given discharge instructions and stated understanding.  Patient is waiting on her labs to come back before she can go home.

## 2021-07-21 ENCOUNTER — Telehealth (HOSPITAL_COMMUNITY): Payer: 59 | Admitting: Psychiatry

## 2021-07-22 ENCOUNTER — Encounter (HOSPITAL_COMMUNITY): Payer: Self-pay | Admitting: Psychiatry

## 2021-07-22 ENCOUNTER — Other Ambulatory Visit: Payer: Self-pay

## 2021-07-22 ENCOUNTER — Ambulatory Visit (HOSPITAL_COMMUNITY): Payer: 59 | Admitting: Psychiatry

## 2021-07-22 VITALS — BP 113/78 | HR 91 | Temp 98.6°F | Ht 62.0 in | Wt 218.4 lb

## 2021-07-22 DIAGNOSIS — F429 Obsessive-compulsive disorder, unspecified: Secondary | ICD-10-CM | POA: Diagnosis not present

## 2021-07-22 DIAGNOSIS — F900 Attention-deficit hyperactivity disorder, predominantly inattentive type: Secondary | ICD-10-CM | POA: Diagnosis not present

## 2021-07-22 DIAGNOSIS — F330 Major depressive disorder, recurrent, mild: Secondary | ICD-10-CM

## 2021-07-22 MED ORDER — OLANZAPINE-FLUOXETINE HCL 6-50 MG PO CAPS: 1.0000 | ORAL_CAPSULE | Freq: Every day | ORAL | 0 refills | Status: DC

## 2021-07-22 MED ORDER — LISDEXAMFETAMINE DIMESYLATE 20 MG PO CAPS: 20.0000 mg | ORAL_CAPSULE | Freq: Every day | ORAL | 0 refills | Status: DC

## 2021-07-22 MED ORDER — VORTIOXETINE HBR 10 MG PO TABS: 10.0000 mg | ORAL_TABLET | Freq: Every day | ORAL | 0 refills | Status: DC

## 2021-07-22 NOTE — Progress Notes (Signed)
Location: Patient Location: Office Provider Location: Office   History of Present Illness: Patient came for her follow-up appointment with her husband.  She recently admitted on the medical floor because of pancreatitis.  She is feeling better now.  Patient reported her anxiety depression is a stable but she like to try stimulant because she noticed she had difficulty in focus, attention and multitasking.  She recalled taking phentermine had helped her focus and she did very well.  She did research and wondering if she can try low-dose stimulant to help her focus attention.  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  Her marriage life is going well.  She also started therapy session with Dr. Alphonse Guild and upon request she had few evaluation and she was told she had symptoms of ADD.  She has no formal psychological testing because her insurance does not approve and it is costing her a lot.  She is no longer taking phentermine after she had lost weight in few months.  She has no plan to go back on phentermine.  She is not sure what causes pancreatitis as she does not have gallbladder and she has no stone and she does not drink alcohol.  Her OCD and depression is a stable.  She has noticed since taking the phentermine her obsessive counting was subsided.  She was able to pay attention and able to complete her job on time.  She was not getting distracted at work.  She also reported at night her mind racing a lot and too many things going on in her head for she go to sleep.  Her Christmas was rough because there were no heat in the house.  She was out of [place] but her pets were in the house and they have to have a portable heater to keep the pets warm.  So far she denies any paranoia, hallucination, suicidal thoughts.  She denies any illegal substance use.  She lives with her husband who is very supportive.  Past Psychiatric History:       H/O  depression and OCD.  Tried Prozac in [place].  No h/o hallucination, paranoia, suicidal attempt, inpatient treatment.  Symbyax worked. Tried Xanax as needed. Luvox caused sexual side effects and Wellbutrin did not helped..     Recent Results (from the past 2160 hour(s))  Urinalysis, Routine w reflex microscopic Urine, Clean Catch     Status: Abnormal   Collection Time: 06/26/21  1:32 AM  Result Value Ref Range   Color, Urine YELLOW YELLOW   APPearance HAZY (A) CLEAR   Specific Gravity, Urine 1.005 1.005 - 1.030   pH 5.0 5.0 - 8.0   Glucose, UA NEGATIVE NEGATIVE mg/dL   Hgb urine dipstick NEGATIVE NEGATIVE   Bilirubin Urine NEGATIVE NEGATIVE   Ketones, ur NEGATIVE NEGATIVE mg/dL   Protein, ur NEGATIVE NEGATIVE mg/dL   Nitrite NEGATIVE NEGATIVE   Leukocytes,Ua NEGATIVE NEGATIVE   RBC / HPF 0-5 0 - 5 RBC/hpf   WBC, UA 0-5 0 - 5 WBC/hpf   Bacteria, UA RARE (A) NONE SEEN   Squamous Epithelial / LPF 0-5 0 - 5   Mucus PRESENT     Comment: Performed at [facility], [address] [place], [place] [address]  Lipase, blood     Status: Abnormal   Collection Time: 06/26/21  1:38 AM  Result Value Ref Range   Lipase 288 (H) 11 - 51 U/L  Comment: Performed at [facility], [place] [address], [place], [place] [address]  Comprehensive metabolic panel     Status: Abnormal   Collection Time: 06/26/21  1:38 AM  Result Value Ref Range   Sodium 135 135 - 145 mmol/L   Potassium 3.6 3.5 - 5.1 mmol/L   Chloride 108 98 - 111 mmol/L   CO2 19 (L) 22 - 32 mmol/L   Glucose, Bld 149 (H) 70 - 99 mg/dL    Comment: Glucose reference range applies only to samples taken after fasting for at least 8 hours.   BUN 12 6 - 20 mg/dL   Creatinine, Ser 1.07 (H) 0.44 - 1.00 mg/dL   Calcium 8.8 (L) 8.9 - 10.3 mg/dL   Total Protein 7.6 6.5 - 8.1 g/dL   Albumin 3.8 3.5 - 5.0 g/dL   AST 15 15 - 41 U/L   ALT 20 0 - 44 U/L   Alkaline Phosphatase 187 (H) 38 - 126 U/L   Total Bilirubin 0.4 0.3 - 1.2 mg/dL    GFR, Estimated >60 >60 mL/min    Comment: (NOTE) Calculated using the CKD-EPI Creatinine Equation (2021)    Anion gap 8 5 - 15    Comment: Performed at [facility], [place] [address], [place], [place] [address]  CBC     Status: Abnormal   Collection Time: 06/26/21  1:38 AM  Result Value Ref Range   WBC 13.5 (H) 4.0 - 10.5 K/uL   RBC 4.81 3.87 - 5.11 MIL/uL   Hemoglobin 12.3 12.0 - 15.0 g/dL   HCT 39.8 36.0 - 46.0 %   MCV 82.7 80.0 - 100.0 fL   MCH 25.6 (L) 26.0 - 34.0 pg   MCHC 30.9 30.0 - 36.0 g/dL   RDW 13.5 11.5 - 15.5 %   Platelets 461 (H) 150 - 400 K/uL   nRBC 0.0 0.0 - 0.2 %    Comment: Performed at [facility], [place] [address], [place], [place] [address]  I-Stat beta hCG blood, ED     Status: None   Collection Time: 06/26/21  1:52 AM  Result Value Ref Range   I-stat hCG, quantitative <5.0 <5 mIU/mL   Comment 3            Comment:   GEST. AGE      CONC.  (mIU/mL)   <=1 WEEK        5 - 50     2 WEEKS       50 - 500     3 WEEKS       100 - 10,000     4 WEEKS     1,000 - 30,000        FEMALE AND NON-PREGNANT FEMALE:     LESS THAN 5 mIU/mL   Resp Panel by RT-PCR (Flu A&B, Covid) Nasopharyngeal Swab     Status: None   Collection Time: 06/28/21  4:58 AM   Specimen: Nasopharyngeal Swab; Nasopharyngeal(NP) swabs in vial transport medium  Result Value Ref Range   SARS Coronavirus 2 by RT PCR NEGATIVE NEGATIVE    Comment: (NOTE) SARS-CoV-2 target nucleic acids are NOT DETECTED.  The SARS-CoV-2 RNA is generally detectable in upper respiratory specimens during the acute phase of infection. The lowest concentration of SARS-CoV-2 viral copies this assay can detect is 138 copies/mL. A negative result does not preclude SARS-Cov-2 infection and should not be used as the sole basis for treatment or other patient management decisions.  A negative result may occur with  improper specimen collection/handling, submission of specimen other than nasopharyngeal swab, presence of  viral mutation(s) within the areas targeted by this assay, and inadequate number of viral copies(<138 copies/mL). A negative result must be combined with clinical observations, patient history, and epidemiological information. The expected result is Negative.  Fact Sheet for Patients:  EntrepreneurPulse.com.au  Fact Sheet for Healthcare Providers:  IncredibleEmployment.be  This test is no t yet approved or cleared by the [place] FDA and  has been authorized for detection and/or diagnosis of SARS-CoV-2 by FDA under an Emergency Use Authorization (EUA). This EUA will remain  in effect (meaning this test can be used) for the duration of the COVID-19 declaration under Section 564(b)(1) of the Act, 21 U.S.C.section 360bbb-3(b)(1), unless the authorization is terminated  or revoked sooner.       Influenza A by PCR NEGATIVE NEGATIVE   Influenza B by PCR NEGATIVE NEGATIVE    Comment: (NOTE) The Xpert Xpress SARS-CoV-2/FLU/RSV plus assay is intended as an aid in the diagnosis of influenza from Nasopharyngeal swab specimens and should not be used as a sole basis for treatment. Nasal washings and aspirates are unacceptable for Xpert Xpress SARS-CoV-2/FLU/RSV testing.  Fact Sheet for Patients: EntrepreneurPulse.com.au  Fact Sheet for Healthcare Providers: IncredibleEmployment.be  This test is not yet approved or cleared by the [place] FDA and has been authorized for detection and/or diagnosis of SARS-CoV-2 by FDA under an Emergency Use Authorization (EUA). This EUA will remain in effect (meaning this test can be used) for the duration of the COVID-19 declaration under Section 564(b)(1) of the Act, 21 U.S.C. section 360bbb-3(b)(1), unless the authorization is terminated or revoked.  Performed at [facility], [place] [address], [place], [place] [address]   CBC     Status: Abnormal   Collection  Time: 06/28/21  6:05 AM  Result Value Ref Range   WBC 8.7 4.0 - 10.5 K/uL   RBC 4.53 3.87 - 5.11 MIL/uL   Hemoglobin 11.7 (L) 12.0 - 15.0 g/dL   HCT 37.0 36.0 - 46.0 %   MCV 81.7 80.0 - 100.0 fL   MCH 25.8 (L) 26.0 - 34.0 pg   MCHC 31.6 30.0 - 36.0 g/dL   RDW 13.3 11.5 - 15.5 %   Platelets 351 150 - 400 K/uL   nRBC 0.0 0.0 - 0.2 %    Comment: Performed at [facility], [place] [address], [place], [place] [address]  Comprehensive metabolic panel     Status: Abnormal   Collection Time: 06/28/21  6:05 AM  Result Value Ref Range   Sodium 138 135 - 145 mmol/L   Potassium 3.4 (L) 3.5 - 5.1 mmol/L   Chloride 109 98 - 111 mmol/L   CO2 19 (L) 22 - 32 mmol/L   Glucose, Bld 106 (H) 70 - 99 mg/dL    Comment: Glucose reference range applies only to samples taken after fasting for at least 8 hours.   BUN 9 6 - 20 mg/dL   Creatinine, Ser 0.92 0.44 - 1.00 mg/dL   Calcium 8.6 (L) 8.9 - 10.3 mg/dL   Total Protein 6.7 6.5 - 8.1 g/dL   Albumin 3.4 (L) 3.5 - 5.0 g/dL   AST 16 15 - 41 U/L   ALT 18 0 - 44 U/L   Alkaline Phosphatase 155 (H) 38 - 126 U/L   Total Bilirubin 0.5 0.3 - 1.2 mg/dL   GFR, Estimated >60 >60 mL/min  Comment: (NOTE) Calculated using the CKD-EPI Creatinine Equation (2021)    Anion gap 10 5 - 15    Comment: Performed at [facility], [place] [address], [place], [place] [address]  Lipase, blood     Status: Abnormal   Collection Time: 06/28/21  6:05 AM  Result Value Ref Range   Lipase 284 (H) 11 - 51 U/L    Comment: Performed at [facility], [place] [address], [place], [place] [address]  HIV Antibody (routine testing w rflx)     Status: None   Collection Time: 06/28/21 11:07 AM  Result Value Ref Range   HIV Screen 4th Generation wRfx Non Reactive Non Reactive    Comment: Performed at [facility], [place] [address], [place], [place] [address]  CBC     Status: Abnormal   Collection Time: 06/29/21  5:26 AM  Result Value Ref Range   WBC 9.2 4.0 - 10.5 K/uL    RBC 4.17 3.87 - 5.11 MIL/uL   Hemoglobin 10.7 (L) 12.0 - 15.0 g/dL   HCT 34.2 (L) 36.0 - 46.0 %   MCV 82.0 80.0 - 100.0 fL   MCH 25.7 (L) 26.0 - 34.0 pg   MCHC 31.3 30.0 - 36.0 g/dL   RDW 13.3 11.5 - 15.5 %   Platelets 325 150 - 400 K/uL   nRBC 0.0 0.0 - 0.2 %    Comment: Performed at [facility], [place] [address], [place], [place] [address]  Comprehensive metabolic panel     Status: Abnormal   Collection Time: 06/29/21  5:26 AM  Result Value Ref Range   Sodium 135 135 - 145 mmol/L   Potassium 3.7 3.5 - 5.1 mmol/L   Chloride 107 98 - 111 mmol/L   CO2 19 (L) 22 - 32 mmol/L   Glucose, Bld 91 70 - 99 mg/dL    Comment: Glucose reference range applies only to samples taken after fasting for at least 8 hours.   BUN <5 (L) 6 - 20 mg/dL   Creatinine, Ser 0.82 0.44 - 1.00 mg/dL   Calcium 8.5 (L) 8.9 - 10.3 mg/dL   Total Protein 6.7 6.5 - 8.1 g/dL   Albumin 3.3 (L) 3.5 - 5.0 g/dL   AST 17 15 - 41 U/L   ALT 19 0 - 44 U/L   Alkaline Phosphatase 154 (H) 38 - 126 U/L   Total Bilirubin 0.6 0.3 - 1.2 mg/dL   GFR, Estimated NOT CALCULATED >60 mL/min    Comment: (NOTE) Calculated using the CKD-EPI Creatinine Equation (2021)    Anion gap 9 5 - 15    Comment: Performed at [facility] [address] [place], [place] [address]  Triglycerides     Status: Abnormal   Collection Time: 06/29/21  5:26 AM  Result Value Ref Range   Triglycerides 220 (H) <150 mg/dL    Comment: Performed at [facility] [address] [place], [place] [address]  Lipid panel     Status: Abnormal   Collection Time: 07/01/21 10:06 AM  Result Value Ref Range   Cholesterol 171 0 - 200 mg/dL   Triglycerides 193 (H) <150 mg/dL   HDL 32 (L) >40 mg/dL   Total CHOL/HDL Ratio 5.3 RATIO   VLDL 39 0 - 40 mg/dL   LDL Cholesterol 100 (H) 0 - 99 mg/dL    Comment:        Total Cholesterol/HDL:CHD Risk Coronary Heart Disease Risk Table  Men   Women  1/2 Average Risk   3.4   3.3  Average  Risk       5.0   4.4  2 X Average Risk   9.6   7.1  3 X Average Risk  23.4   11.0        Use the calculated Patient Ratio above and the CHD Risk Table to determine the patient's CHD Risk.        ATP III CLASSIFICATION (LDL):  <100     mg/dL   Optimal  100-129  mg/dL   Near or Above                    Optimal  130-159  mg/dL   Borderline  160-189  mg/dL   High  >190     mg/dL   Very High Performed at [facility] [address], [place], [place] [address]   Basic metabolic panel     Status: Abnormal   Collection Time: 07/01/21 10:06 AM  Result Value Ref Range   Sodium 134 (L) 135 - 145 mmol/L   Potassium 3.3 (L) 3.5 - 5.1 mmol/L   Chloride 110 98 - 111 mmol/L   CO2 20 (L) 22 - 32 mmol/L   Glucose, Bld 147 (H) 70 - 99 mg/dL    Comment: Glucose reference range applies only to samples taken after fasting for at least 8 hours.   BUN <5 (L) 6 - 20 mg/dL   Creatinine, Ser 0.77 0.44 - 1.00 mg/dL   Calcium 8.2 (L) 8.9 - 10.3 mg/dL   GFR, Estimated >60 >60 mL/min    Comment: (NOTE) Calculated using the CKD-EPI Creatinine Equation (2021)    Anion gap 4 (L) 5 - 15    Comment: Performed at [facility] [address], [place], [place] [address]  CBC with Differential/Platelet     Status: Abnormal   Collection Time: 07/01/21 10:06 AM  Result Value Ref Range   WBC 7.6 4.0 - 10.5 K/uL   RBC 3.95 3.87 - 5.11 MIL/uL   Hemoglobin 10.0 (L) 12.0 - 15.0 g/dL   HCT 33.1 (L) 36.0 - 46.0 %   MCV 83.8 80.0 - 100.0 fL   MCH 25.3 (L) 26.0 - 34.0 pg   MCHC 30.2 30.0 - 36.0 g/dL   RDW 13.2 11.5 - 15.5 %   Platelets 288 150 - 400 K/uL   nRBC 0.0 0.0 - 0.2 %   Neutrophils Relative % 61 %   Neutro Abs 4.7 1.7 - 7.7 K/uL   Lymphocytes Relative 29 %   Lymphs Abs 2.2 0.7 - 4.0 K/uL   Monocytes Relative 5 %   Monocytes Absolute 0.4 0.1 - 1.0 K/uL   Eosinophils Relative 3 %   Eosinophils Absolute 0.3 0.0 - 0.5 K/uL   Basophils Relative 1 %   Basophils Absolute 0.1 0.0 - 0.1 K/uL    Immature Granulocytes 1 %   Abs Immature Granulocytes 0.04 0.00 - 0.07 K/uL    Comment: Performed at [facility] [address] [place], [place] [address]  Magnesium     Status: None   Collection Time: 07/01/21 10:06 AM  Result Value Ref Range   Magnesium 2.0 1.7 - 2.4 mg/dL    Comment: Performed at [facility] [address], [place], [place] [address]     Psychiatric Specialty Exam: Physical Exam  Review of Systems  Blood pressure 113/78, pulse 91,  temperature 98.6 F (37 C), temperature source Oral, height 5' 2"  (1.575 m), weight 218 lb 6.4 oz (99.1 kg), SpO2 98 %.Body mass index is 39.95 kg/m.  General Appearance: Casual  Eye Contact:  Good  Speech:  Clear and Coherent  Volume:  Normal  Mood:  Anxious  Affect:  Congruent  Thought Process:  Goal Directed  Orientation:  Full (Time, Place, and Person)  Thought Content:  Rumination and sometimes distracted  Suicidal Thoughts:  No  Homicidal Thoughts:  No  Memory:  Immediate;   Good Recent;   Good Remote;   Good  Judgement:  Intact  Insight:  Present  Psychomotor Activity:  Normal  Concentration:  Concentration: Fair and Attention Span: Fair  Recall:  Good  Fund of Knowledge:  Good  Language:  Good  Akathisia:  No  Handed:  Right  AIMS (if indicated):     Assets:  Communication Skills [employer] Talents/Skills Transportation  ADL's:  Intact  Cognition:  WNL  Sleep:   fair     Assessment and Plan: Obsessive-compulsive disorder.  Major depressive disorder, recurrent.  ADHD, inattentive type by history.  I reviewed her blood work results after recent admission for pancreatitis.  I had a long discussion with the patient about her symptoms, medication.  I explained usually stimulant cause worsening of OCD and anxiety but patient insists that she like to try since she feels short-term phentermine had helped her focus attention.  After some discussion he  agreed to give a trial low-dose of Vyvanse 20 mg but reminded if she noticed worsening of anxiety, OCD then she need to stop the medication immediately.  She agreed with the plan.  We will continue Symbyax 6/50 once a day and Trintellix 10 mg daily.  Encouraged to continue therapy with Dr. Luciano Cutter.  Will refer her for psychological testing in [facility] if she can afford.  Patient like to have a follow-up in 4 weeks.   Follow Up Instructions:    I discussed the assessment and treatment plan with the patient. The patient was provided an opportunity to ask questions and all were answered. The patient agreed with the plan and demonstrated an understanding of the instructions.     Kathlee Nations, MD

## 2021-07-24 ENCOUNTER — Encounter (HOSPITAL_COMMUNITY): Payer: Self-pay | Admitting: *Deleted

## 2021-07-24 ENCOUNTER — Other Ambulatory Visit: Payer: Self-pay

## 2021-07-24 ENCOUNTER — Emergency Department (HOSPITAL_COMMUNITY)
Admission: EM | Admit: 2021-07-24 | Discharge: 2021-07-24 | Disposition: A | Discharge status: Home / Self Care | Payer: Managed Care, Other (non HMO) | Attending: Emergency Medicine | Admitting: Emergency Medicine

## 2021-07-24 DIAGNOSIS — Z932 Ileostomy status: Secondary | ICD-10-CM | POA: Insufficient documentation

## 2021-07-24 DIAGNOSIS — N9489 Other specified conditions associated with female genital organs and menstrual cycle: Secondary | ICD-10-CM | POA: Diagnosis not present

## 2021-07-24 DIAGNOSIS — R1013 Epigastric pain: Secondary | ICD-10-CM

## 2021-07-24 LAB — CBC WITH DIFFERENTIAL/PLATELET
Abs Immature Granulocytes: 0.09 10*3/uL — ABNORMAL HIGH (ref 0.00–0.07)
Basophils Absolute: 0.1 10*3/uL (ref 0.0–0.1)
Basophils Relative: 1 %
Eosinophils Absolute: 0.2 10*3/uL (ref 0.0–0.5)
Eosinophils Relative: 2 %
HCT: 36.2 % (ref 36.0–46.0)
Hemoglobin: 11 g/dL — ABNORMAL LOW (ref 12.0–15.0)
Immature Granulocytes: 1 %
Lymphocytes Relative: 37 %
Lymphs Abs: 4 10*3/uL (ref 0.7–4.0)
MCH: 25 pg — ABNORMAL LOW (ref 26.0–34.0)
MCHC: 30.4 g/dL (ref 30.0–36.0)
MCV: 82.3 fL (ref 80.0–100.0)
Monocytes Absolute: 0.5 10*3/uL (ref 0.1–1.0)
Monocytes Relative: 5 %
Neutro Abs: 5.8 10*3/uL (ref 1.7–7.7)
Neutrophils Relative %: 54 %
Platelets: 386 10*3/uL (ref 150–400)
RBC: 4.4 MIL/uL (ref 3.87–5.11)
RDW: 13.5 % (ref 11.5–15.5)
WBC: 10.8 10*3/uL — ABNORMAL HIGH (ref 4.0–10.5)
nRBC: 0 % (ref 0.0–0.2)

## 2021-07-24 LAB — COMPREHENSIVE METABOLIC PANEL
ALT: 21 U/L (ref 0–44)
AST: 18 U/L (ref 15–41)
Albumin: 3.5 g/dL (ref 3.5–5.0)
Alkaline Phosphatase: 162 U/L — ABNORMAL HIGH (ref 38–126)
Anion gap: 9 (ref 5–15)
BUN: 11 mg/dL (ref 6–20)
CO2: 20 mmol/L — ABNORMAL LOW (ref 22–32)
Calcium: 8.5 mg/dL — ABNORMAL LOW (ref 8.9–10.3)
Chloride: 110 mmol/L (ref 98–111)
Creatinine, Ser: 0.93 mg/dL (ref 0.44–1.00)
GFR, Estimated: >60 mL/min (ref >60)
Glucose, Bld: 109 mg/dL — ABNORMAL HIGH (ref 70–99)
Potassium: 3.3 mmol/L — ABNORMAL LOW (ref 3.5–5.1)
Sodium: 139 mmol/L (ref 135–145)
Total Bilirubin: 0.4 mg/dL (ref 0.3–1.2)
Total Protein: 7.1 g/dL (ref 6.5–8.1)

## 2021-07-24 LAB — LIPASE, BLOOD: Lipase: 96 U/L — ABNORMAL HIGH (ref 11–51)

## 2021-07-24 LAB — HCG, QUANTITATIVE, PREGNANCY: hCG, Beta Chain, Quant, S: <1 m[IU]/mL (ref <5)

## 2021-07-24 MED ORDER — HYDROCODONE-ACETAMINOPHEN 5-325 MG PO TABS
2.0000 | ORAL_TABLET | Freq: Once | ORAL | Status: AC
  Administered 2021-07-24: 2 via ORAL
  Filled 2021-07-24: qty 2

## 2021-07-24 MED ORDER — SODIUM CHLORIDE 0.9 % IV BOLUS
1000.0000 mL | Freq: Once | INTRAVENOUS | Status: AC
  Administered 2021-07-24: 1000 mL via INTRAVENOUS

## 2021-07-24 MED ORDER — ONDANSETRON HCL 4 MG/2ML IJ SOLN
4.0000 mg | Freq: Once | INTRAMUSCULAR | Status: AC
  Administered 2021-07-24: 4 mg via INTRAVENOUS
  Filled 2021-07-24: qty 2

## 2021-07-24 MED ORDER — HYDROMORPHONE HCL 1 MG/ML IJ SOLN
0.5000 mg | Freq: Once | INTRAMUSCULAR | Status: AC
  Administered 2021-07-24: 0.5 mg via INTRAVENOUS
  Filled 2021-07-24: qty 1

## 2021-07-24 NOTE — ED Notes (Signed)
Pt c/o of pain w/ BP cuff, pt took off cuff

## 2021-07-24 NOTE — ED Notes (Signed)
Pt given a 2nd cup of water, tolerating well

## 2021-07-24 NOTE — Discharge Instructions (Addendum)
You were seen today for abdominal pain.  At this time you are stable to discharge home with follow-up with your GI provider.  Resume your prescription of Zofran. Continue to hydrate at home with fluids.  Return to the ED if symptoms worsen or if you develop life-threatening symptoms such as chest pain or shortness of breath

## 2021-07-24 NOTE — ED Triage Notes (Signed)
Patient presents to ed c/o upper abd. Pain onset 2 days ago and is getting worse. States she was recently  hospitalized for same and was dx. With pancreatitis. States the pain is getting worse c/o nausea no vomitng.

## 2021-07-24 NOTE — ED Notes (Signed)
Pt given water 

## 2021-07-24 NOTE — ED Provider Notes (Signed)
[facility] EMERGENCY DEPARTMENT Provider Note   CSN: [id number] Arrival date & time: 07/24/21  0555     History  Chief Complaint  Patient presents with   Abdominal Pain    Diana Page is a 31 y.o. female. The patient presents to the emergency department today complaining of increasing epigastric pain and what is described as an oily discharge in her ostomy bag. Patient has PMH significant for recent hospitalization for assumed acute pancreatitis, ulcerative colitis.  The abdominal pain has been worsening over the past two days with some associated nausea. The pain is worse in the epigastric area. The patient has been taking Tylenol for pain.   HPI     Home Medications Prior to Admission medications   Medication Sig Start Date End Date Taking? Authorizing Provider  acetaminophen (TYLENOL) 500 MG tablet Take 500-1,000 mg by mouth every 6 (six) hours as needed (for pain).    [provider]  acetaZOLAMIDE ER (DIAMOX) 500 MG capsule Take 500-1,000 mg by mouth 2 (two) times daily. Pt takes 1000 mg at bedtime and 500 mg in the AM    [provider]  ALPRAZolam Duanne Moron) 0.5 MG tablet as needed. Patient not taking: Reported on 04/22/2021 05/09/19   [provider]  Incontinence Supplies (Isabela) [place] by Does not apply route. Patient not taking: Reported on 06/28/2021 09/06/14   [provider]  lisdexamfetamine (VYVANSE) 20 MG capsule Take 1 capsule (20 mg total) by mouth daily. 07/22/21   Arfeen, Arlyce Harman, MD  OLANZapine-FLUoxetine (SYMBYAX) 6-50 MG capsule Take 1 capsule by mouth daily. 07/22/21   Arfeen, Arlyce Harman, MD  ondansetron (ZOFRAN-ODT) 4 MG disintegrating tablet Take 4 mg by mouth 3 (three) times daily. Patient not taking: Reported on 07/22/2021 09/07/20   [provider]  Ostomy Supplies MISC by Does not apply route. 09/06/14   [provider]  pantoprazole (PROTONIX) 40 MG tablet Take 40 mg by  mouth daily. Patient not taking: Reported on 07/22/2021    [provider]  phentermine (ADIPEX-P) 37.5 MG tablet Take 37.5 mg by mouth daily. Patient not taking: Reported on 07/22/2021 03/31/21   [provider]  vortioxetine HBr (TRINTELLIX) 10 MG TABS tablet Take 1 tablet (10 mg total) by mouth daily. 07/22/21   Arfeen, Arlyce Harman, MD      Allergies    Nsaids and Tolmetin    Review of Systems   Review of Systems  Constitutional:  Positive for appetite change. Negative for fever.  Respiratory:  Negative for cough and shortness of breath.   Cardiovascular:  Negative for chest pain.  Gastrointestinal:  Positive for abdominal pain and nausea.  Skin:  Negative for color change.   Physical Exam Updated Vital Signs BP 109/86 (BP Location: Right Arm)    Pulse 84    Temp 97.6 F (36.4 C) (Oral)    Resp 19    Ht 5' 2"  (1.575 m)    Wt 98.9 kg    SpO2 99%    BMI 39.87 kg/m  Physical Exam HENT:     Head: Normocephalic and atraumatic.  Eyes:     Pupils: Pupils are equal, round, and reactive to light.  Pulmonary:     Effort: Pulmonary effort is normal. No respiratory distress.     Breath sounds: Normal breath sounds.  Abdominal:     General: There is no distension.     Palpations: Abdomen is soft.     Tenderness: There is abdominal  tenderness in the epigastric area.     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    ED Results / Procedures / Treatments   Labs (all labs ordered are listed, but only abnormal results are displayed) Labs Reviewed  CBC WITH DIFFERENTIAL/PLATELET - Abnormal; Notable for the following components:      Result Value   WBC 10.8 (*)    Hemoglobin 11.0 (*)    MCH 25.0 (*)    Abs Immature Granulocytes 0.09 (*)    All other components within normal limits  COMPREHENSIVE METABOLIC PANEL - Abnormal; Notable for the following components:   Potassium 3.3 (*)    CO2 20 (*)    Glucose, Bld 109  (*)    Calcium 8.5 (*)    Alkaline Phosphatase 162 (*)    All other components within normal limits  LIPASE, BLOOD - Abnormal; Notable for the following components:   Lipase 96 (*)    All other components within normal limits  HCG, QUANTITATIVE, PREGNANCY  URINALYSIS, ROUTINE W REFLEX MICROSCOPIC    EKG None  Radiology No results found.  Procedures .1-3 Lead EKG Interpretation Performed by: Dorothyann Peng, PA Authorized by: Dorothyann Peng, PA     Interpretation: normal     ECG rate:  90   ECG rate assessment: normal     Rhythm: sinus rhythm     Ectopy: none     Conduction: normal      Medications Ordered in ED Medications  sodium chloride 0.9 % bolus 1,000 mL (1,000 mLs Intravenous New Bag/Given 07/24/21 0703)  ondansetron (ZOFRAN) injection 4 mg (4 mg Intravenous Given 07/24/21 0701)  HYDROmorphone (DILAUDID) injection 0.5 mg (0.5 mg Intravenous Given 07/24/21 0816)    ED Course/ Medical Decision Making/ A&P                           Medical Decision Making Amount and/or Complexity of Data Reviewed Labs: ordered.  Risk Prescription drug management.   This patient presents to the ED for concern of abdominal pain, this involves an extensive number of treatment options, and is a complaint that carries with it a high risk of complications and morbidity.  The differential diagnosis includes but is not exclusive to pancreatitis, infectious gastritis, IBD flare    Additional history obtained:   External records from outside source obtained and reviewed including prior ED visit notes, recent discharge summary, recent inpatient GI notes   Lab Tests:  I Ordered, and personally interpreted labs.  The pertinent results include:  WBC 10.8, increased from last visit. HgB 11.0, improved from last visit. Lipase 96, significantly improved from last visit.    Imaging Studies ordered:  None   Cardiac Monitoring:  The patient was maintained on a cardiac monitor.   I personally viewed and interpreted the cardiac monitored which showed an underlying rhythm of: Normal sinus rhythm   Medicines ordered and prescription drug management:  I ordered medication including zofran for nausea, hydromorphone and norco for pain Reevaluation of the patient after these medicines showed that the patient improved I have reviewed the patients home medicines and have made adjustments as needed   Test Considered:  CT abdomen.    Critical Interventions:      None   Consultations Obtained:  None    Reevaluation:  After the interventions noted above, I reevaluated the patient and found that  they have :improved     Dispostion:  After consideration of the diagnostic results and the patients response to treatment, I feel that the patent would benefit from discharge home with outpatient GI follow up. These symptoms are very similar to her previous presentation. Lab values are improved from previous visits. She is able to tolerate PO intake at this time. I do not feel based on labs and patient condition that admission is necessary at this time. Return precautions provided   Final Clinical Impression(s) / ED Diagnoses Final diagnoses:  Epigastric pain    Rx / DC Orders ED Discharge Orders     None         Dorothyann Peng, [place] 07/24/21 [place], DO 07/24/21 1019

## 2021-07-24 NOTE — ED Notes (Signed)
RN reviewed discharge instructions w/ pt. Follow up, & medications reviewed, pt had no further questions.

## 2021-07-24 NOTE — ED Provider Notes (Signed)
Patient seen in conjunction with [facility].   Patient with recent admission for presumptive pancreatitis.  She states that she has had several bouts in the past.  Unclear etiology.  CT less than a month ago was negative.  Followed by Dr. Benson Norway.  Symptoms currently are well controlled.  She has received IV fluids, Zofran and Dilaudid.  On exam, minimal tenderness, no rebound or guarding.  Lab work-up shows mild leukocytosis, mild anemia, potassium 3.3 and normal kidney function.  Suspect that she will be able to go home with continued symptom control.  Patient seems agreeable to the plan.  BP 109/86 (BP Location: Right Arm)    Pulse 84    Temp 97.6 F (36.4 C) (Oral)    Resp 19    Ht 5' 2"  (1.575 m)    Wt 98.9 kg    SpO2 99%    BMI 39.87 kg/m       Carlisle Cater, PA-C 07/24/21 0825    Deno Etienne, DO 07/24/21 0840

## 2021-07-26 ENCOUNTER — Telehealth (HOSPITAL_COMMUNITY): Payer: Self-pay

## 2021-07-26 NOTE — Telephone Encounter (Signed)
WRITER DID A PA WITH [facility] ON PATIENT'S VYVANSE 20MG CAPSULE SENT FOR REVIEW ON 07/26/21. WAITING ON DETERMINATION

## 2021-07-29 ENCOUNTER — Telehealth (HOSPITAL_COMMUNITY): Payer: Self-pay

## 2021-07-29 NOTE — Telephone Encounter (Signed)
RECEIVED A FAX FROM [facility] ON [address] REQUESTING A PA ON PATIENT'S  LISDEXAMFETAMINE (VYVANSE) 20MG CAPSULE [employer]  SENT FOR REVIEW WAITING ON DETERMINATION

## 2021-08-03 ENCOUNTER — Telehealth (HOSPITAL_COMMUNITY): Payer: Self-pay

## 2021-08-03 DIAGNOSIS — F900 Attention-deficit hyperactivity disorder, predominantly inattentive type: Secondary | ICD-10-CM

## 2021-08-03 NOTE — Telephone Encounter (Signed)
Ask if she is ok to try ritalin 5 mg?

## 2021-08-03 NOTE — Telephone Encounter (Signed)
Writer called [employer] and s/w Santiago Glad regarding status of patient's PA on her Vyvanse 28m capsule. KSantiago Gladstated that pt's medication is Denied and that Vyvanse can't be the first choice. She stated that it's Not Preferred and that pt hasn't tried the alternatives which are Dextroamphetamine, Dexmethylphenidate, and Methylphenidate. She stated that pt has to have tried these before they will even consider an Approval on the Vyvanse. CChristella Scheuermannalso stated that the Vyvanse is going to be expensive. Please review and advise. Thank you

## 2021-08-03 NOTE — Telephone Encounter (Signed)
Pt OK with trying Ritalin 80m

## 2021-08-04 MED ORDER — METHYLPHENIDATE HCL 5 MG PO TABS: 5.0000 mg | ORAL_TABLET | Freq: Every day | ORAL | 0 refills | Status: DC

## 2021-08-04 NOTE — Telephone Encounter (Signed)
Done

## 2021-08-04 NOTE — Addendum Note (Signed)
Addended by: Berniece Andreas T on: 08/04/2021 09:52 AM   Modules accepted: Orders

## 2021-08-09 ENCOUNTER — Ambulatory Visit: Payer: Managed Care, Other (non HMO) | Admitting: Neurology

## 2021-08-11 ENCOUNTER — Encounter: Payer: Self-pay | Admitting: Neurology

## 2021-08-11 ENCOUNTER — Ambulatory Visit: Payer: Managed Care, Other (non HMO) | Admitting: Neurology

## 2021-08-11 VITALS — BP 125/87 | HR 83 | Ht 62.0 in | Wt 219.2 lb

## 2021-08-11 DIAGNOSIS — G932 Benign intracranial hypertension: Secondary | ICD-10-CM | POA: Diagnosis not present

## 2021-08-11 MED ORDER — ACETAZOLAMIDE ER 500 MG PO CP12: 1000.0000 mg | ORAL_CAPSULE | Freq: Every evening | ORAL | 3 refills | Status: DC

## 2021-08-11 NOTE — Patient Instructions (Addendum)
Since she has lost 30 pounds and doing well on lower diamox dose will keep her here. Can try to decrease to 1 pill 533m at bedtime and if no symptoms stay there or if more weight loss can try stopping altogether and just watching for symptoms. Talk to obgyn about diamox, they may have to perform more U/S testing for amniotic fluid but diamox can be taken in pregnancy. Recommend following up with Dr. GKaty Fitchin 3 -4 months especilly if you decie to stop the diamox to see what happens.  Acetazolamide Oral Tablets What is this medication? ACETAZOLAMIDE (a set a ZOLE a mide) is a diuretic. It helps you make more urine and to lose salt and excess water from your body. It treats swelling from heart disease. It helps treat some seizures and some kinds of glaucoma. It also treats and prevents symptoms of altitude sickness (acute mountain sickness). This medicine may be used for other purposes; ask your health care provider or pharmacist if you have questions. COMMON BRAND NAME(S): Diamox What should I tell my care team before I take this medication? They need to know if you have any of these conditions: glaucoma kidney disease liver disease low adrenal gland function lung or breathing disease (COPD, chronic bronchitis, emphysema) an unusual or allergic reaction to acetazolamide, sulfa drugs, other drugs, foods, dyes or preservatives pregnant or trying to get pregnant breast-feeding How should I use this medication? Take this drug by mouth. Take it as directed on the prescription label at the same time every day. You can take it with or without food. If it upsets your stomach, take it with food. Keep taking it unless your health care provider tells you to stop. Talk to your health care provider about the use of this drug in children. Special care may be needed. Overdosage: If you think you have taken too much of this medicine contact a poison control center or emergency room at once. NOTE: This medicine  is only for you. Do not share this medicine with others. What if I miss a dose? If you miss a dose, take it as soon as you can. If it is almost time for your next dose, take only that dose. Do not take double or extra doses. What may interact with this medication? Do not take this medicine with any of the following medications: methazolamide This medicine may also interact with the following medications: aspirin and aspirin-like medicines cyclosporine lithium medicine for diabetes methenamine other diuretics phenytoin primidone quinidine sodium bicarbonate stimulant medicines like dextroamphetamine This list may not describe all possible interactions. Give your health care provider a list of all the medicines, herbs, non-prescription drugs, or dietary supplements you use. Also tell them if you smoke, drink alcohol, or use illegal drugs. Some items may interact with your medicine. What should I watch for while using this medication? Visit your health care provider for regular checks on your progress. Tell your health care provider if your symptoms do not start to get better or if they get worse. This drug may cause serious skin reactions. They can happen weeks to months after starting the drug. Contact your health care provider right away if you notice fevers or flu-like symptoms with a rash. The rash may be red or purple and then turn into blisters or peeling of the skin. Or, you might notice a red rash with swelling of the face, lips or lymph nodes in your neck or under your arms. You may get drowsy or dizzy.  Do not drive, use machinery, or do anything that needs mental alertness until you know how this drug affects you. Do not stand up or sit up quickly, especially if you are an older patient. This reduces the risk of dizzy or fainting spells. What side effects may I notice from receiving this medication? Side effects that you should report to your doctor or health care provider as soon as  possible: allergic reactions (skin rash, itching or hives; swelling of the face, lips, or tongue) high acid levels (trouble breathing; fast, irregular heartbeat; headache; confusion; unusually weak or tired; nausea, vomiting) infection (fever, chills, cough, sore throat, pain or trouble passing urine) liver injury (dark yellow or brown urine; general ill feeling or flu-like symptoms; loss of appetite, right upper belly pain; unusually weak or tired, yellowing of the eyes or skin) low red blood cell counts (trouble breathing; feeling faint; lightheaded, falls; unusually weak or tired) redness, blistering, peeling, or loosening of the skin, including inside the mouth unusual bruising or bleeding Side effects that usually do not require medical attention (report to your doctor or health care provider if they continue or are bothersome): decreased hearing, ringing of the ears diarrhea increased thirst kidney stones (blood in the urine; pain when urinating; pain the lower back or side) loss of appetite feeling faint or lightheaded, falls; muscle cramps or pain) nausea pain, tingling, numbness in the hands or feet unusual sweating vomiting This list may not describe all possible side effects. Call your doctor for medical advice about side effects. You may report side effects to FDA at 1-800-FDA-1088. Where should I keep my medication? Keep out of the reach of children and pets. Store at room temperature between 20 and 25 degrees C (68 and 77 degrees F). Throw away any unused drug after the expiration date. NOTE: This sheet is a summary. It may not cover all possible information. If you have questions about this medicine, talk to your doctor, pharmacist, or health care provider.  2022 [employer] Standard (2021-03-02 00:00:00)

## 2021-08-11 NOTE — Progress Notes (Signed)
[name]    Provider:  [name] Referring Provider: No ref. provider found Primary Care Physician:  Pcp, No     CC: IIH  Interval history 08/11/2021: Patient with a history of IDIOPATHIC INTRACRANIAL HYPERTENSION and abnormal brain MRI who has been doing well on diamox.She has reduced her diamox to 2 at bedtime 1078m at bedtime and doing great, no vision problems, no headaches. Since she has lost 30 pounds and doing well on lower diamox dose will keep her here. Can try to decrease to 1 pill 506mat bedtime and if no symptoms stay there or if more weight loss can try stopping altogether and just watching for symptoms. Talk to obgyn about diamox, they may have to perform more U/S testing for amniotic fluid but diamox can be taken in pregnancy. Recommend following up with Dr. GrKaty Fitchn 3 -4 months especilly if you decie to stop the diamox to see what happens. Discussed pregnancy and IDIOPATHIC INTRACRANIAL HYPERTENSION, losing more weight, risks od diamox in pregnancy and monitoring closely while pregnant for any recurrence of iih or papilledema.   Interval history 01/23/2020: Patient with a history of IDIOPATHIC INTRACRANIAL HYPERTENSION and abnormal brain MRI who has been doing well on diamox who is here for worsening symptoms. She has been doing extremely well until recently, she has had several incidents with positional headache, when she gets up she hears the wooshing noise and pulsating/pounding, if she turns her head she has vision changes and vision changes, started 2 months ago, worsening, she has increased headaches, faint light in the corner of her eye and peripheral vision loss, she is having headaches at least 1/2 the month of 15 headache days a month, more pressure around the head all over. Unknown triggers, lasts a few hours. This is definitely a change, she takes the diamox, no weight gain.  Patient complains of symptoms per HPI as well as the following symptoms:  headache, vision changes . Pertinent negatives and positives per HPI. All others negative   Interva history: Ajovy works really well. She starts having migraines when it is time to take another shot. 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 Works all month. Last 5 days she has breakthrough that can be significant, not always but can be. For month of the month no headaches at all. She doesn't want to change anything however, tylenol doesn't help acutely.  We discussed and she is doing so well and having migraines infrequently when she does have them to make sure that she treats them right away, will prescribe her Maxalt again she has never had a problem with triptans, will also give her Nurtec and advised her she can take multiple medications at once if needed.  I also reviewed [name], he recently saw her October 01, 2019, visual field full again today, OCT RNFL shows definite improvement stable with no edema, tolerating the Diamox and symptoms are controlled, she does want to change anything until she loses a significant amount of weight.  Interval history 02/18/2019:  worsening headaches. She is now having 2 migraines days a week or more, more hedaches. Pounding, throbbing, unilateral, nausea, light/sound sensitivity, worse with movement, sitting still in a dark room, bending over makes it pound worse.  This is been ongoing for the last 6 months.  She is worried because she is also having positional headaches, and exertional headaches when she sneezes, she has a history of idiopathic intracranial hypertension and this  makes her worry for for other etiologies.  Waking up with headaches now.  She has a history of abnormal enhancement in the brain last was 2017 we will repeat an MRI.  I discussed with her that this may be progression of migraines, she is very compliant with her Diamox and she has not gained weight.  In fact she is going to have bariatric  surgery.  Given symptoms we will order MRI of the brain.  We will also start her on CGRP.  Interval history 11/07/2018: She was pursuing bariatric surgery and couldn;t get it due to a virus. She is going to contact it again. Motrin, tylenol not helpng when she does get a headache. She does get occ migraines, needs to take something 3x a month. We discussed choices for acute management in detail. For her migraines will try a triptan. It is pounding, throbbing, unilateral with light and sound sensitivity. Do not recommend long-term opioids for pain management or acute migraines.  Interval history 10/24/2017: She is doing well. Still on the Diamox. Doing well. She will be going to Bariatric surgery referral which I recommend as weight loss is critical due to her condition and risk of permanent vision loss. IIH is associated with obesity and bariatric surgery is indicated in this case. No headaches, no vision changes, feel stable on medication. She is having dizziness at night which can be a symptom, needs bariatric surgery.   Interval history 01/23/2017: She has had 2 headaches in the last 6 months but it was in the setting of dehydration.  Her weight is fluctuating. She was 212 recently which is her highest. She saw [name] for her weight and she was placed on metformin and had side effects. She is watching her diet, prefers to try differently. She is going back to school and quitting work, getting married in [place]. No headaches, vision is fine no vision changes, she sees [name] every 4 months, doing well on the diamox, visual fields are stable,  No more edema of the eyes. Discussed Bariatric surgery.     Interval history 07/25/2016: 31 year old here for follow up on IIH on acetazolamide and obesity. She is getting married in November. No dizziness. Rare headaches while on the medication however she is on 15108m diamox a day and having a difficult time losing weight which is critical in IIH. Her  headaches are better,  1-2 since being last seen. No vision changes, no dizziness, no hearing changes. She is following weight watchers now and she saw a dietician but still having difficulty with weight loss.  She stopped eating meat and is worried about her B12 and iron. No snoring, no morning headache. She takes 15038macetazolamide. She has missed 3 periods but 2 pregnancy tests are negative. They use condoms.  She is on 150067miamox daily and I can decrease this dose if she can lose 10% of her body weight, we may be able to get her off of medication with even more weight loss. Will refer to the [facility] for weight loss. She is having a difficult time with weight loss and I recommend the [facility] [place]ed the role of weight in IIH and vision loss.    Interval history 03/21/2016:   [age]ar old female with papilledema, dizziness, positional headache, vision and hearing changes.  Opening pressure was 34.5, improved on diamox. Abnormal MRI with abnormal signal within the medial temporal lobes bilaterally predominantly involving the  hippocampal structures. She was working hard, she was not drinking water and she was lifting a lot of things and she was running all day and she felt lightheaded and dizzy and fatigued. She went and sat down for 5 minutes and she was ok. She was not drinking any water. She has one headache that lasted a whole day and a whole night it was terrible. She tried giving up caffeine. Likely caffeine withdrawal. Restared caffeine and fine. Vision has been fine. No snoring, no morning headaches. She takes 1531m acetazolamide.    Interval history 09/16/2015: She is having worsening blurry vision. This started within the last month. Things are not as crisp. No pressure. She is also hearing low level, wooshing in the ears. Worse when she is laying down and then stands. No headaches, no pressure. No side effects from the diamox. She has not  been able to lose weight. Will keep her on the current dose and then if weight loss of 10% will decreasing the diamox. Need to repeat MRi of the brain due to new worsneing vision and hearing issues given previously seen Abnormal signal within the medial temporal lobes bilaterally predominantly involving the hippocampal structures.   MRI 09/26/2015: FINDINGS:  The brain parenchyma shows no significant abnormalities except subtle prominence and thickening of the right hippocampus and medial temporal lobe which is less prominent compared to the previous scan and is of unclear significance. No other structural lesion, tumor infarcts are noted. Diffusion weighted imaging is negative for acute ischemia. Susceptibility weighted imaging is negative for microhemorrhages. Subarachnoid spaces and ventricular system appear normal. Extraction brain structures up and normal. Calvarium shows no abnormalities. The pituitary gland and cerebellar tonsils appear normal. Flow voids of the large vessels are intact and circulation appear to be patent. Orbits and paranasal sinuses appear normal. Visualized portions of the cervical spine appears normal. Contrast images do not result in any abnormal areas of enhancement.         IMPRESSION:  Slightly abnormal MRI scan of the brain showing subtle prominence of the right medial temporal lobe which is of unclear significance. Overall no significant change compared with previous MRI dated 01/16/2015.   Interval update: Opening pressure of LP was 34.5. 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 the thumping and pressure in her head have improved. The peripheral vision changes are gone, no headaches. She needs follow up with ophthalmology. She needs an ophthalmologist to follow in  the area, will refer to Dr. GKaty Fitch She denies any current side effects from the diamox.    HPI:  Diana Prittsis a 31y.o. female here as a referral from Dr. AJeanie Cooksfor dizziness.   PMHx obesity. She was seen in the ED for acute onset headache and dizziness with decreased peripheral vision. She had an abnormal MRI and was called back to the ED for lumbar puncture which showed negative/normal CSF analysis. Unfortunately the LP did not include an opening pressure. She felt much better after the LP however. Currently if she stands up, she gets a thumping in the head. She gets stars in her right > Left peripheral vision. She gets pressure in the head all over. If she lays on her stomach, the pressure is worse and she can feel her heartbeat in her head. She went to optometry who diagnosed edema  of the optic disks. Right eye has blurry vision which is persistent. She has had severe headaches. The dizziness has resolved after the LP. Symptoms felt much better after LP. Denies seizure-like events, staring, altered awareness. Symptoms are daily. No other associated focal neurologic deficits. No neck pain.    Reviewed notes, labs and imaging from outside physicians, which showed: MRi brain 12/27/2014: Personally reviewed images: 1. Abnormal signal within the medial temporal lobes bilaterally predominantly involving the hippocampal structures. This is concerning for infectious process including herpes encephalitis. Other viral encephalitides can give a similar picture. Limbic encephalitis can look like this, but is typically associated with lung cancer. Alternatively, this could be related to seizure activity. The patient has no history of seizures. 2. Question abnormal signal within the anterior aspect of the right optic nerve, potentially related to same process.    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  Widening of the optic nerve sheath. This can be observed with intracranial hypertension, and is commonly seen with pseudotumor cerebri. 2.  The subtle signal changes in the mesial temporal lobes appear unchanged. Although initially worrisome for an infectious or limbic encephalopathy, the stability and the absence of specific clinical symptoms make this less likely.   If clinically indicated, consider follow-up evaluation or follow-up CSF analysis   Labs:    CSF: protein, glucose, diff, cell count normal TSH, B12, B1, HIV wnl CRP, ESR elevated CMP unremarkable CBC w/ anemia     Patient complains of symptoms per HPI as well as the following symptoms: weight loss . Pertinent negatives and positives per HPI. All others negative    Social History   Socioeconomic History   Marital status: Married    Spouse name: Lennette Bihari   Number of children: 0   Years of education: Ba   Highest education level: Not on file  Occupational History   Occupation: Librarian, academic     Comment: Chief Operating Officer  Tobacco Use   Smoking status: Former    Types: Cigarettes, E-cigarettes    Quit date: 06/27/2009    Years since quitting: 12.1   Smokeless tobacco: Never  Vaping Use   Vaping Use: Former  Substance and Sexual Activity   Alcohol use: Never    Comment: occasionally, "maybe once or twice a year"   Drug use: No   Sexual activity: Not Currently    Partners: Male    Birth control/protection: Condom  Other Topics Concern   Not on file  Social History Narrative   Lives at home with husband, Lennette Bihari.   Caffeine use: maybe 5 big cups daily   Right handed   Social Determinants of Health   Financial Resource Strain: Not on file  Food Insecurity: Not on file  Transportation  Needs: Not on file  Physical Activity: Not on file  Stress: Not on file  Social Connections: Not on file  Intimate Partner Violence: Not on file    Family History  Problem Relation Age of Onset   OCD Mother    Diabetes Mother    Depression Mother    Anxiety disorder Mother    Obesity Mother    Obesity Father  Crohn's disease Father    OCD Sister    Depression Sister    Ulcerative colitis Sister    OCD Brother    Anxiety disorder Brother    Diverticulitis Brother     Past Medical History:  Diagnosis Date   Anemia    Anxiety    Back pain    Depression    Fatty liver    GERD (gastroesophageal reflux disease)    Headache 01/04/2015   Hx of cholecystectomy    Hypertension    Intercranial Hypertension   IBS (irritable bowel syndrome)    Joint pain    Obsessive compulsive disorder 11/2012   Ostomy nurse consultation    Pancreatitis    hx of    UC (ulcerative colitis) ([place])     Past Surgical History:  Procedure Laterality Date   CHOLECYSTECTOMY     LAPAROSCOPIC PARTIAL PROTECTOMY     PERMANENT ILEOSTOMY     TOTAL COLECTOMY N/A     Current Outpatient Medications  Medication Sig Dispense Refill   ALPRAZolam (XANAX) 0.5 MG tablet as needed.     Incontinence Supplies (DRAINABLE FECAL COLLECTOR) MISC by Does not apply route.     methylphenidate (RITALIN) 5 MG tablet Take 1 tablet (5 mg total) by mouth daily. 30 tablet 0   OLANZapine-FLUoxetine (SYMBYAX) 6-50 MG capsule Take 1 capsule by mouth daily. 90 capsule 0   ondansetron (ZOFRAN-ODT) 4 MG disintegrating tablet Take 4 mg by mouth 3 (three) times daily.     Ostomy Supplies MISC by Does not apply route.     pantoprazole (PROTONIX) 40 MG tablet Take 40 mg by mouth daily.     vortioxetine HBr (TRINTELLIX) 10 MG TABS tablet Take 1 tablet (10 mg total) by mouth daily. 90 tablet 0   acetaZOLAMIDE ER (DIAMOX) 500 MG capsule Take 2 capsules (1,000 mg total) by mouth at bedtime. 180 capsule 3   No current  facility-administered medications for this visit.    Allergies as of 08/11/2021 - Review Complete 08/11/2021  Allergen Reaction Noted   Nsaids Other (See Comments) 07/17/2014   Tolmetin Other (See Comments) 10/14/2014    Vitals: BP 125/87    Pulse 83    Ht 5' 2"  (1.575 m)    Wt 219 lb 3.2 oz (99.4 kg)    BMI 40.09 kg/m  Last Weight:  Wt Readings from Last 1 Encounters:  08/11/21 219 lb 3.2 oz (99.4 kg)   Last Height:   Ht Readings from Last 1 Encounters:  08/11/21 5' 2"  (1.575 m)   Physical exam: Exam: Gen: NAD, conversant, well nourised, obese, well groomed                     CV: RRR, no MRG. No Carotid Bruits. No peripheral edema, warm, nontender Eyes: Conjunctivae clear without exudates or hemorrhage  Neuro: Detailed Neurologic Exam  Speech:    Speech is normal; fluent and spontaneous with normal comprehension.  Cognition:    The patient is oriented to person, place, and time;     recent and remote memory intact;     language fluent;     normal attention, concentration,     fund of knowledge Cranial Nerves:    The pupils are equal, round, and reactive to light. The fundi are normal and spontaneous venous pulsations are present. Visual fields are full to finger confrontation. Extraocular movements are intact. Trigeminal sensation is intact and the muscles of mastication are normal. The face is symmetric.  The palate elevates in the midline. Hearing intact. Voice is normal. Shoulder shrug is normal. The tongue has normal motion without fasciculations.   Coordination:    Normal   Gait:     normal.   Motor Observation:    No asymmetry, no atrophy, and no involuntary movements noted. Tone:    Normal muscle tone.    Posture:    Posture is normal. normal erect    Strength:    Strength is V/V in the upper and lower limbs.      Sensation: intact to LT      Assessment/Plan:  66 old female with worseninf papilledema, dizziness, positional headache, vision and  hearing changes, obesity.  Opening pressure was 34.5, improved on diamox. Abnormal MRI with abnormal signal within the medial temporal lobes bilaterally predominantly involving the hippocampal structures, stable on multiple repeat MRIs.  -She has reduced her diamox to 2 at bedtime 1070m at bedtime(from 20033ma day) since she lost weight and doing great, no vision problems, no headaches. Since she has lost 30 pounds and doing well on lower diamox dose will keep her here. Can try to decrease to 1 pill 50033mt bedtime and if no symptoms stay there or if more weight loss can try stopping altogether and just watching for symptoms. - Talk to obgyn about diamox, they may have to perform more U/S testing for amniotic fluid but diamox can be taken in pregnancy.   -Recommend following up with me in 4 months and Dr. GroKaty Fitch 6 months especilly if you decie to stop the diamox to see what happens.  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 Repeat MRi showed subtle signal changes in the mesial temporal lobes unchanged, although initially worrisome for an infectious or limbic encephalopathy, the stability and the absence of specific clinical symptoms make this less likely.  -For her migraine she is doing well on the Ajovy works mostly all month but she does have breakthrough when is time for her injection.  We did discuss do not get pregnant on this need to be off of it for 5 months if considering pregnancy  -Acutely since she has migraines infrequently she can take Maxalt, Nurtec, Tylenol acutely she can even take all 3 together for severe  headache.   - AArie Sabinar migraines: tried topamax, diamox, amitriptyline   Discussed weight loss gain as is critical and discussed weight in the pathophysiology of IIH; refer to Bariatric surgery, bariatric surgery is indicated due to her condition Continue diamox. Discussed again teratogenicity, she is getting married next year, advised weight loss before children, although diamox is generally considered compatible with pregnancy no medication is entirely safe. IhaJaneal Holmesvised we can decrease the medication if she loses 10% of body weight and we may be able to entirely stop the diamox with a healthy weight.   - Should proceed to ED if vision significantly worsens - Follow ups with Dr. GroKaty Fitchve been very positive, she sees him regularly - She is in the process of bariatric surgery - declined it due to her GI problems - maxalt acutely. For migraines, continue - Migraines: conitnue ajovy  No orders of the defined types were placed in this encounter.  Meds ordered this encounter  Medications   acetaZOLAMIDE ER (DIAMOX) 500 MG capsule    Sig: Take 2 capsules (1,000 mg total) by mouth at bedtime.    Dispense:  180 capsule    Refill:  3   I spent 40 minutes of face-to-face and non-face-to-face time with patient on the  1. IIH (idiopathic intracranial hypertension)     diagnosis.  This included previsit chart review, lab review, study review, order entry, electronic health record documentation, patient education on the different diagnostic and therapeutic options, counseling and coordination of care, risks and benefits of management, compliance, or risk factor reduction   Sarina Ill, MD  [facility] [address] [place], [place] [address]  Phone [phone] Fax [phone]  I spent 40 minutes of face-to-face and non-face-to-face time with patient on the  1. IIH (idiopathic intracranial hypertension)    diagnosis.  This included previsit chart  review, lab review, study review, order entry, electronic health record documentation, patient education on the different diagnostic and therapeutic options, counseling and coordination of care, risks and benefits of management, compliance, or risk factor reduction

## 2021-08-17 DIAGNOSIS — F429 Obsessive-compulsive disorder, unspecified: Secondary | ICD-10-CM | POA: Insufficient documentation

## 2021-08-19 ENCOUNTER — Telehealth (HOSPITAL_COMMUNITY): Payer: 59 | Admitting: Psychiatry

## 2021-08-25 ENCOUNTER — Telehealth (HOSPITAL_COMMUNITY): Payer: 59 | Admitting: Psychiatry

## 2021-08-27 ENCOUNTER — Telehealth (HOSPITAL_BASED_OUTPATIENT_CLINIC_OR_DEPARTMENT_OTHER): Payer: 59 | Admitting: Psychiatry

## 2021-08-27 ENCOUNTER — Encounter (HOSPITAL_COMMUNITY): Payer: Self-pay | Admitting: Psychiatry

## 2021-08-27 ENCOUNTER — Other Ambulatory Visit: Payer: Self-pay

## 2021-08-27 DIAGNOSIS — F330 Major depressive disorder, recurrent, mild: Secondary | ICD-10-CM

## 2021-08-27 DIAGNOSIS — F429 Obsessive-compulsive disorder, unspecified: Secondary | ICD-10-CM

## 2021-08-27 DIAGNOSIS — F900 Attention-deficit hyperactivity disorder, predominantly inattentive type: Secondary | ICD-10-CM

## 2021-08-27 MED ORDER — METHYLPHENIDATE HCL 10 MG PO TABS: 10.0000 mg | ORAL_TABLET | Freq: Every day | ORAL | 0 refills | Status: DC

## 2021-08-27 NOTE — Progress Notes (Signed)
Virtual Visit via Telephone Note ? ?I connected with Diana Page on 08/27/21 at 11:40 AM EST by telephone and verified that I am speaking with the correct person using two identifiers. ? ?Location: ?Patient: Home ?Provider: Home Office ?  ?I discussed the limitations, risks, security and privacy concerns of performing an evaluation and management service by telephone and the availability of in person appointments. I also discussed with the patient that there may be a patient responsible charge related to this service. The patient expressed understanding and agreed to proceed. ? ? ?History of Present Illness: ?Patient was evaluated by phone session.  We started her on Vyvanse on the last visit to help her ADHD symptoms.  However her insurance does not cover Vyvanse.  We started her on Ritalin.  She is taking 5 mg but has not seen a huge improvement.  She is still struggling with attention, focus and multitasking.  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  She has no other major issue in her life.  Her husband is supportive.  She is in session with Dr. Alphonse Guild upon request.  She had another visit to the ER for abdominal pain.  Her lipase are somewhat better but is still high.  She has appointment to see a GI in 4 weeks.  She also had a visit to neurology for her intracranial hypertension.  Her OCD and depression is a stable.  She sleeps good.  She is concerned about her getting distraction and like to have other options to address the symptoms.   ? ? ?Past Psychiatric History:       ?H/O depression and OCD.  Tried Prozac in [place].  No h/o hallucination, paranoia, suicidal attempt, inpatient treatment.  Symbyax worked. Tried Xanax as needed. Luvox caused sexual side effects and Wellbutrin did not helped..    ? ?Psychiatric Specialty Exam: ?Physical Exam  ?Review of Systems  ?Weight 219 lb (99.3 kg).Body mass index is 40.06 kg/m?.  ?General Appearance: NA  ?Eye Contact:   NA  ?Speech:  Normal Rate  ?Volume:  Normal  ?Mood:  Euthymic  ?Affect:  NA  ?Thought Process:  Goal Directed  ?Orientation:  Full (Time, Place, and Person)  ?Thought Content:  WDL  ?Suicidal Thoughts:  No  ?Homicidal Thoughts:  No  ?Memory:  Immediate;   Good ?Recent;   Good ?Remote;   Good  ?Judgement:  Good  ?Insight:  Good  ?Psychomotor Activity:  NA  ?Concentration:  Concentration: Fair and Attention Span: Fair  ?Recall:  Good  ?Fund of Knowledge:  Good  ?Language:  Good  ?Akathisia:  No  ?Handed:  Right  ?AIMS (if indicated):     ?Assets:  Communication Skills ?Desire for Improvement ?Housing ?Resilience ?Social Support ?Talents/Skills ?Transportation  ?ADL's:  Intact  ?Cognition:  WNL  ?Sleep:   ok  ? ? ? ? ?Assessment and Plan: ?Obsessive-compulsive disorder.  Major depressive disorder, recurrent.  ADHD, inattentive type. ? ?We have tried Vyvanse but it did not cover by her insurance.  She is taking Ritalin 5 mg but do not see a huge improvement.  We talk about nonstimulant medication as high the dose of stimulant may cause worsening of OCD.  She also have intracranial hypertension and sometimes stimulant can cause worsening of the symptoms.  We also discussed she can try higher dose of Ritalin up to maximum 10 mg.  Patient like to try Ritalin rather than going to a different medication.  She promised to keep the appointment with her eye physician and neurologist and call them earlier if she noticed any issue in her vision.  She also keep a close eye on her OCD symptoms.  We will try Ritalin 10 mg.  However I also recommend she should consider nonstimulant like Strattera if her insurance approved.  For now continue Trintellix 10 mg daily, Symbyax 6 /50 mg at bedtime.  Encouraged to continue therapy with Dr. Laurene Footman.  We have talked about getting a referral for psychological testing to establish documented history of ADHD.  Follow-up in 4 weeks. ? ? ? ?Follow Up Instructions: ? ?  ?I discussed the assessment  and treatment plan with the patient. The patient was provided an opportunity to ask questions and all were answered. The patient agreed with the plan and demonstrated an understanding of the instructions. ?  ?The patient was advised to call back or seek an in-person evaluation if the symptoms worsen or if the condition fails to improve as anticipated. ? ?I provided 24 minutes of non-face-to-face time during this encounter. ? ? ?Kathlee Nations, MD  ?

## 2021-09-27 ENCOUNTER — Encounter (HOSPITAL_COMMUNITY): Payer: Self-pay | Admitting: Psychiatry

## 2021-09-27 ENCOUNTER — Telehealth (HOSPITAL_BASED_OUTPATIENT_CLINIC_OR_DEPARTMENT_OTHER): Payer: 59 | Admitting: Psychiatry

## 2021-09-27 DIAGNOSIS — F429 Obsessive-compulsive disorder, unspecified: Secondary | ICD-10-CM

## 2021-09-27 DIAGNOSIS — F330 Major depressive disorder, recurrent, mild: Secondary | ICD-10-CM

## 2021-09-27 DIAGNOSIS — F900 Attention-deficit hyperactivity disorder, predominantly inattentive type: Secondary | ICD-10-CM | POA: Diagnosis not present

## 2021-09-27 MED ORDER — METHYLPHENIDATE HCL 10 MG PO TABS: 15.0000 mg | ORAL_TABLET | Freq: Every day | ORAL | 0 refills | Status: DC

## 2021-09-27 NOTE — Progress Notes (Signed)
Virtual Visit via Telephone Note ? ?I connected with Diana Page on 09/27/21 at  9:40 AM EDT by telephone and verified that I am speaking with the correct person using two identifiers. ? ?Location: ?Patient: Home ?Provider: Home Office ?  ?I discussed the limitations, risks, security and privacy concerns of performing an evaluation and management service by telephone and the availability of in person appointments. I also discussed with the patient that there may be a patient responsible charge related to this service. The patient expressed understanding and agreed to proceed. ? ? ?History of Present Illness: ?Patient is evaluated by phone session.  She liked the Ritalin but noticed in the afternoon she feels very groggy, tired and zombie.  She is taking Trintellix and Symbyax.  She has no more abdominal pain.  She noticed her grades are improved and her sleep is much better.  She denies any panic attack or any tremor or shakes or any EPS.  Her energy level is good in the morning but in the afternoon she gets tired.  She noticed her attention concentration is much better during the day.  She is making good grades.  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  She lives with her husband who is supportive.  She denies drinking or using any illegal substances.  Her headaches are stable.  Her OCD and depression is also stable. ?   ?Past Psychiatric History:       ?H/O depression and OCD.  Tried Prozac in [place].  No h/o hallucination, paranoia, suicidal attempt or inpatient treatment.  Symbyax worked. Tried Xanax as needed. Luvox caused sexual side effects and Wellbutrin did not helped.Vyvanse expensive.    ?  ?Psychiatric Specialty Exam: ?Physical Exam  ?Review of Systems  ?Weight 222 lb (100.7 kg).There is no height or weight on file to calculate BMI.  ?General Appearance: NA  ?Eye Contact:  NA  ?Speech:  Clear and Coherent  ?Volume:  Normal  ?Mood:  Euthymic  ?Affect:  NA  ?Thought Process:  Goal  Directed  ?Orientation:  Full (Time, Place, and Person)  ?Thought Content:  Logical  ?Suicidal Thoughts:  No  ?Homicidal Thoughts:  No  ?Memory:  Immediate;   Good ?Recent;   Good ?Remote;   Good  ?Judgement:  Good  ?Insight:  Good  ?Psychomotor Activity:  Normal  ?Concentration:  Concentration: Good and Attention Span: Good  ?Recall:  Good  ?Fund of Knowledge:  Good  ?Language:  Good  ?Akathisia:  No  ?Handed:  Right  ?AIMS (if indicated):     ?Assets:  Communication Skills ?Desire for Improvement ?Housing ?Resilience ?Social Support ?Talents/Skills ?Transportation  ?ADL's:  Intact  ?Cognition:  WNL  ?Sleep:   better  ? ? ? ? ?Assessment and Plan: ?Obsessive-compulsive disorder.  Major depressive disorder, recurrent.  ADHD, inattentive type. ? ?Patient liked the Ritalin but she feel her dose does not work late afternoon and she feels zombie and tired.  I recommend to try Ritalin 10 mg in the morning but also at 5 mg at noon that can help rest of the day.  We also talked about getting psychological testing to get established diagnosis of ADHD.  She is in therapy with Dr. Redge Gainer however psychological testing does not cover her insurance if she do at her therapist office.  We will refer to other psychological Center for testing to establish diagnosis.  She will continue Symbyax 6/50 to help her OCD and depression and Trintellix 10 mg daily.  A new prescription of Ritalin 10 mg in the morning and 5 at noon sent to the pharmacy.  Recommended to call us back if she has any question or any concern.  Follow-up in 6 weeks. ? ?Follow Up Instructions: ? ?  ?I discussed the assessment and treatment plan with the patient. The patient was provided an opportunity to ask questions and all were answered. The patient agreed with the plan and demonstrated an understanding of the instructions. ?  ?The patient was advised to call back or seek an in-person evaluation if the symptoms worsen or if the condition fails to improve as  anticipated. ? ?I provided 15 minutes of non-face-to-face time during this encounter. ? ? ?Kathlee Nations, MD  ?

## 2021-10-26 ENCOUNTER — Telehealth (HOSPITAL_COMMUNITY): Payer: Self-pay | Admitting: *Deleted

## 2021-10-26 DIAGNOSIS — F900 Attention-deficit hyperactivity disorder, predominantly inattentive type: Secondary | ICD-10-CM

## 2021-10-26 MED ORDER — METHYLPHENIDATE HCL 10 MG PO TABS: 15.0000 mg | ORAL_TABLET | Freq: Every day | ORAL | 0 refills | Status: DC

## 2021-10-26 NOTE — Telephone Encounter (Signed)
Done

## 2021-10-26 NOTE — Telephone Encounter (Signed)
Pt called for refill of Ritalin 10 mg last ordered on 09/27/21. Pt has an upcoming appointment on 11/08/21.  ?

## 2021-11-08 ENCOUNTER — Telehealth (HOSPITAL_BASED_OUTPATIENT_CLINIC_OR_DEPARTMENT_OTHER): Payer: 59 | Admitting: Psychiatry

## 2021-11-08 ENCOUNTER — Encounter (HOSPITAL_COMMUNITY): Payer: Self-pay | Admitting: Psychiatry

## 2021-11-08 DIAGNOSIS — F429 Obsessive-compulsive disorder, unspecified: Secondary | ICD-10-CM

## 2021-11-08 DIAGNOSIS — F330 Major depressive disorder, recurrent, mild: Secondary | ICD-10-CM | POA: Diagnosis not present

## 2021-11-08 DIAGNOSIS — F900 Attention-deficit hyperactivity disorder, predominantly inattentive type: Secondary | ICD-10-CM

## 2021-11-08 MED ORDER — VORTIOXETINE HBR 10 MG PO TABS: 10.0000 mg | ORAL_TABLET | Freq: Every day | ORAL | 0 refills | Status: DC

## 2021-11-08 MED ORDER — OLANZAPINE-FLUOXETINE HCL 6-50 MG PO CAPS: 1.0000 | ORAL_CAPSULE | Freq: Every day | ORAL | 0 refills | Status: DC

## 2021-11-08 MED ORDER — METHYLPHENIDATE HCL 10 MG PO TABS: 15.0000 mg | ORAL_TABLET | Freq: Every day | ORAL | 0 refills | Status: DC

## 2021-11-08 NOTE — Progress Notes (Signed)
Virtual Visit via Telephone Note ? ?I connected with Diana Page on 11/08/21 at  1:40 PM EDT by telephone and verified that I am speaking with the correct person using two identifiers. ? ?Location: ?Patient: Home ?Provider: Home Office ?  ?I discussed the limitations, risks, security and privacy concerns of performing an evaluation and management service by telephone and the availability of in person appointments. I also discussed with the patient that there may be a patient responsible charge related to this service. The patient expressed understanding and agreed to proceed. ? ? ?History of Present Illness: ?Patient is evaluated by phone session.  6 weeks ago we have adjusted Diana Page Ritalin dose and she is doing much better.  She is sleeping better.  Diana Page energy level is improved.  She is able to make good grades and Diana Page GPA is about 4.0.  She is now taking 1 summer class.  She denies any crying spells or any feeling of hopelessness or worthlessness.  She denies any panic attacks.  Diana Page OCD symptoms are also under control.  She has not taken Xanax in a while.  She lives with Diana Page husband who is very supportive.  She has upcoming trip to [place] in August.  She denies any recent abdominal pain which happened in January and she was diagnosed with pancreatitis.  She like to keep the current medication. ? ?Past Psychiatric History:       ?H/O depression and OCD.  Tried Prozac in [place].  No h/o hallucination, paranoia, suicidal attempt or inpatient treatment.  Symbyax worked. Tried Xanax as needed. Luvox caused sexual side effects and Wellbutrin did not helped.Vyvanse expensive.    ? ?Psychiatric Specialty Exam: ?Physical Exam  ?Review of Systems  ?Weight 222 lb (100.7 kg).There is no height or weight on file to calculate BMI.  ?General Appearance: NA  ?Eye Contact:  Negative  ?Speech:  Clear and Coherent and Normal Rate  ?Volume:  Normal  ?Mood:  Euthymic  ?Affect:  NA  ?Thought Process:  Goal Directed  ?Orientation:   Full (Time, Place, and Person)  ?Thought Content:  Logical  ?Suicidal Thoughts:  No  ?Homicidal Thoughts:  No  ?Memory:  Immediate;   Good ?Recent;   Good ?Remote;   Good  ?Judgement:  Good  ?Insight:  Present  ?Psychomotor Activity:  NA  ?Concentration:  Concentration: Good and Attention Span: Good  ?Recall:  Good  ?Fund of Knowledge:  Good  ?Language:  Good  ?Akathisia:  No  ?Handed:  Right  ?AIMS (if indicated):     ?Assets:  Communication Skills ?Desire for Improvement ?Housing ?Resilience ?Social Support ?Transportation  ?ADL's:  Intact  ?Cognition:  WNL  ?Sleep:   good  ?  ? ? ?Assessment and Plan: ?Obsessive-compulsive disorder.  Major depressive disorder, recurrent.  ADHD, inattentive type. ? ?Patient doing much better with addition of 5 mg Ritalin at noon.  She is able to focus and multitasking.  Diana Page grades are improved.  She is in therapy with Dr. Redge Gainer.  Discussed medication side effects and benefits.  Patient is not drinking or using any illegal substances.  Continue Trintellix 10 mg daily, Symbyax 6/50 mg daily and Ritalin 10 mg in the morning and 5 mg at noon.  Recommended to call us back if she has any question or any concern.  Follow-up in 3 months. ? ?Follow Up Instructions: ? ?  ?I discussed the assessment and treatment plan with the patient. The patient was provided an opportunity to ask  questions and all were answered. The patient agreed with the plan and demonstrated an understanding of the instructions. ?  ?The patient was advised to call back or seek an in-person evaluation if the symptoms worsen or if the condition fails to improve as anticipated. ? ?Collaboration of Care: Primary Care Provider AEB notes are available in epic to review. ? ?Patient/Guardian was advised Release of Information must be obtained prior to any record release in order to collaborate their care with an outside provider. Patient/Guardian was advised if they have not already done so to contact the registration  department to sign all necessary forms in order for [place] to release information regarding their care.  ? ?Consent: Patient/Guardian gives verbal consent for treatment and assignment of benefits for services provided during this visit. Patient/Guardian expressed understanding and agreed to proceed.   ? ?I provided 17 minutes of non-face-to-face time during this encounter. ? ? ?Kathlee Nations, MD  ?

## 2021-11-25 ENCOUNTER — Other Ambulatory Visit: Payer: Self-pay | Admitting: Neurology

## 2021-11-25 ENCOUNTER — Telehealth (HOSPITAL_COMMUNITY): Payer: Self-pay

## 2021-11-25 NOTE — Telephone Encounter (Signed)
Medication management - Telephone call with patient, after she left a message requesting a new Ritalin order, to inform patient Dr. Adele Schilder had already sent in a new order to her [facility] on [address] with instructions the medication could be filled 11/26/21.  Patient agreed to call pharmacy to have order filled as reported that was fine and has several days remaining of current order.  Patient to call back if any issues.

## 2021-12-09 ENCOUNTER — Encounter: Payer: Self-pay | Admitting: Neurology

## 2021-12-09 ENCOUNTER — Ambulatory Visit: Payer: Managed Care, Other (non HMO) | Admitting: Neurology

## 2021-12-09 VITALS — BP 121/75 | HR 75 | Ht 62.0 in | Wt 227.4 lb

## 2021-12-09 DIAGNOSIS — G932 Benign intracranial hypertension: Secondary | ICD-10-CM

## 2021-12-09 NOTE — Progress Notes (Unsigned)
[name]    Provider:  [name] Referring Provider: No ref. provider found Primary Care Physician:  Minette Brine, FNP     CC: IDIOPATHIC INTRACRANIAL HYPERTENSION  She stayed on 2 pills. No headaches. No vision changes.   Interval history 08/11/2021: Patient with a history of IDIOPATHIC INTRACRANIAL HYPERTENSION and abnormal brain MRI who has been doing well on diamox.She has reduced her diamox to 2 at bedtime 1056m at bedtime and doing great, no vision problems, no headaches. Since she has lost 30 pounds and doing well on lower diamox dose will keep her here. Can try to decrease to 1 pill 5086mat bedtime and if no symptoms stay there or if more weight loss can try stopping altogether and just watching for symptoms. Talk to obgyn about diamox, they may have to perform more U/S testing for amniotic fluid but diamox can be taken in pregnancy. Recommend following up with Dr. GrKaty Fitchn 3 -4 months especilly if you decie to stop the diamox to see what happens. Discussed pregnancy and IDIOPATHIC INTRACRANIAL HYPERTENSION, losing more weight, risks od diamox in pregnancy and monitoring closely while pregnant for any recurrence of iih or papilledema.   Interval history 01/23/2020: Patient with a history of IDIOPATHIC INTRACRANIAL HYPERTENSION and abnormal brain MRI who has been doing well on diamox who is here for worsening symptoms. She has been doing extremely well until recently, she has had several incidents with positional headache, when she gets up she hears the wooshing noise and pulsating/pounding, if she turns her head she has vision changes and vision changes, started 2 months ago, worsening, she has increased headaches, faint light in the corner of her eye and peripheral vision loss, she is having headaches at least 1/2 the month of 15 headache days a month, more pressure around the head all over. Unknown triggers, lasts a few hours. This is definitely a change, she takes  the diamox, no weight gain.  Patient complains of symptoms per HPI as well as the following symptoms: headache, vision changes . Pertinent negatives and positives per HPI. All others negative   Interva history: Ajovy works really well. She starts having migraines when it is time to take another shot. 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 Works all month. Last 5 days she has breakthrough that can be significant, not always but can be. For month of the month no headaches at all. She doesn't want to change anything however, tylenol doesn't help acutely.  We discussed and she is doing so well and having migraines infrequently when she does have them to make sure that she treats them right away, will prescribe her Maxalt again she has never had a problem with triptans, will also give her Nurtec and advised her she can take multiple medications at once if needed.  I also reviewed [name], he recently saw her October 01, 2019, visual field full again today, OCT RNFL shows definite improvement stable with no edema, tolerating the Diamox and symptoms are controlled, she does want to change anything until she loses a significant amount of weight.  Interval history 02/18/2019:  worsening headaches. She is now having 2 migraines days a week or more, more hedaches. Pounding, throbbing, unilateral, nausea, light/sound sensitivity, worse with movement, sitting still in a dark room, bending over makes it pound worse.  This is been ongoing for the last 6 months.  She is worried because she is also having positional headaches, and  exertional headaches when she sneezes, she has a history of idiopathic intracranial hypertension and this makes her worry for for other etiologies.  Waking up with headaches now.  She has a history of abnormal enhancement in the brain last was 2017 we will repeat an MRI.  I discussed with her that this may be progression of migraines, she  is very compliant with her Diamox and she has not gained weight.  In fact she is going to have bariatric surgery.  Given symptoms we will order MRI of the brain.  We will also start her on CGRP.  Interval history 11/07/2018: She was pursuing bariatric surgery and couldn;t get it due to a virus. She is going to contact it again. Motrin, tylenol not helpng when she does get a headache. She does get occ migraines, needs to take something 3x a month. We discussed choices for acute management in detail. For her migraines will try a triptan. It is pounding, throbbing, unilateral with light and sound sensitivity. Do not recommend long-term opioids for pain management or acute migraines.  Interval history 10/24/2017: She is doing well. Still on the Diamox. Doing well. She will be going to Bariatric surgery referral which I recommend as weight loss is critical due to her condition and risk of permanent vision loss. IIH is associated with obesity and bariatric surgery is indicated in this case. No headaches, no vision changes, feel stable on medication. She is having dizziness at night which can be a symptom, needs bariatric surgery.   Interval history 01/23/2017: She has had 2 headaches in the last 6 months but it was in the setting of dehydration.  Her weight is fluctuating. She was 212 recently which is her highest. She saw [name] for her weight and she was placed on metformin and had side effects. She is watching her diet, prefers to try differently. She is going back to school and quitting work, getting married in [place]. No headaches, vision is fine no vision changes, she sees [name] every 4 months, doing well on the diamox, visual fields are stable,  No more edema of the eyes. Discussed Bariatric surgery.     Interval history 07/25/2016: 31 year old here for follow up on IIH on acetazolamide and obesity. She is getting married in November. No dizziness. Rare headaches while on the medication however she  is on 1531m diamox a day and having a difficult time losing weight which is critical in IIH. Her headaches are better,  1-2 since being last seen. No vision changes, no dizziness, no hearing changes. She is following weight watchers now and she saw a dietician but still having difficulty with weight loss.  She stopped eating meat and is worried about her B12 and iron. No snoring, no morning headache. She takes 15017macetazolamide. She has missed 3 periods but 2 pregnancy tests are negative. They use condoms.  She is on 150094miamox daily and I can decrease this dose if she can lose 10% of her body weight, we may be able to get her off of medication with even more weight loss. Will refer to the [facility] for weight loss. She is having a difficult time with weight loss and I recommend [place] [facility] [place] role of weight in IIH and vision loss.    Interval history 03/21/2016:   [age]ar old female with papilledema, dizziness, positional headache, vision and hearing changes.  Opening pressure was 34.5, improved on  diamox. Abnormal MRI with abnormal signal within the medial temporal lobes bilaterally predominantly involving the hippocampal structures. She was working hard, she was not drinking water and she was lifting a lot of things and she was running all day and she felt lightheaded and dizzy and fatigued. She went and sat down for 5 minutes and she was ok. She was not drinking any water. She has one headache that lasted a whole day and a whole night it was terrible. She tried giving up caffeine. Likely caffeine withdrawal. Restared caffeine and fine. Vision has been fine. No snoring, no morning headaches. She takes 1535m acetazolamide.    Interval history 09/16/2015: She is having worsening blurry vision. This started within the last month. Things are not as crisp. No pressure. She is also hearing low level, wooshing in the ears. Worse when she is laying  down and then stands. No headaches, no pressure. No side effects from the diamox. She has not been able to lose weight. Will keep her on the current dose and then if weight loss of 10% will decreasing the diamox. Need to repeat MRi of the brain due to new worsneing vision and hearing issues given previously seen Abnormal signal within the medial temporal lobes bilaterally predominantly involving the hippocampal structures.   MRI 09/26/2015: FINDINGS:  The brain parenchyma shows no significant abnormalities except subtle prominence and thickening of the right hippocampus and medial temporal lobe which is less prominent compared to the previous scan and is of unclear significance. No other structural lesion, tumor infarcts are noted. Diffusion weighted imaging is negative for acute ischemia. Susceptibility weighted imaging is negative for microhemorrhages. Subarachnoid spaces and ventricular system appear normal. Extraction brain structures up and normal. Calvarium shows no abnormalities. The pituitary gland and cerebellar tonsils appear normal. Flow voids of the large vessels are intact and circulation appear to be patent. Orbits and paranasal sinuses appear normal. Visualized portions of the cervical spine appears normal. Contrast images do not result in any abnormal areas of enhancement.         IMPRESSION:  Slightly abnormal MRI scan of the brain showing subtle prominence of the right medial temporal lobe which is of unclear significance. Overall no significant change compared with previous MRI dated 01/16/2015.   Interval update: Opening pressure of LP was 34.5. 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 the thumping and pressure in her head have improved. The peripheral vision changes are gone, no  headaches. She needs follow up with ophthalmology. She needs an ophthalmologist to follow in the area, will refer to Dr. GKaty Fitch She denies any current side effects from the diamox.    HPI:  Diana Branaganis a 31y.o. female here as a referral from Dr. AJeanie Cooksfor dizziness.   PMHx obesity. She was seen in the ED for acute onset headache and dizziness with decreased peripheral vision. She had an abnormal MRI and was called back to the ED for lumbar puncture which showed negative/normal CSF analysis. Unfortunately the LP did not include an opening pressure. She felt much better after the LP however. Currently if she stands up, she gets a thumping in the head. She gets stars in her right > Left peripheral vision. She gets pressure in the head all over. If she lays on her stomach, the pressure is worse and  she can feel her heartbeat in her head. She went to optometry who diagnosed edema of the optic disks. Right eye has blurry vision which is persistent. She has had severe headaches. The dizziness has resolved after the LP. Symptoms felt much better after LP. Denies seizure-like events, staring, altered awareness. Symptoms are daily. No other associated focal neurologic deficits. No neck pain.    Reviewed notes, labs and imaging from outside physicians, which showed: MRi brain 12/27/2014: Personally reviewed images: 1. Abnormal signal within the medial temporal lobes bilaterally predominantly involving the hippocampal structures. This is concerning for infectious process including herpes encephalitis. Other viral encephalitides can give a similar picture. Limbic encephalitis can look like this, but is typically associated with lung cancer. Alternatively, this could be related to seizure activity. The patient has no history of seizures. 2. Question abnormal signal within the anterior aspect of the right optic nerve, potentially related to same process.    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  Widening of the optic nerve sheath. This can be observed with intracranial hypertension, and is commonly seen with pseudotumor cerebri. 2.  The subtle signal changes in the mesial temporal lobes appear unchanged. Although initially worrisome for an infectious or limbic encephalopathy, the stability and the absence of specific clinical symptoms make this less likely.   If clinically indicated, consider follow-up evaluation or follow-up CSF analysis   Labs:    CSF: protein, glucose, diff, cell count normal TSH, B12, B1, HIV wnl CRP, ESR elevated CMP unremarkable CBC w/ anemia     Patient complains of symptoms per HPI as well as the following symptoms: weight loss . Pertinent negatives and positives per HPI. All others negative    Social History   Socioeconomic History   Marital status: Married    Spouse name: Lennette Bihari   Number of children: 0   Years of education: Ba   Highest education level: Not on file  Occupational History   Occupation: Librarian, academic     Comment: Chief Operating Officer  Tobacco Use   Smoking status: Former    Types: Cigarettes, E-cigarettes    Quit date: 06/27/2009    Years since quitting: 12.4   Smokeless tobacco: Never  Vaping Use   Vaping Use: Former  Substance and Sexual Activity   Alcohol use: Never    Comment: occasionally, "maybe once or twice a year"   Drug use: No   Sexual activity: Not Currently    Partners: Male    Birth control/protection: Condom  Other Topics Concern   Not on file  Social History Narrative   Lives at home with husband, Lennette Bihari.   Caffeine use: maybe 5 big cups daily   Right handed   Social Determinants of Health    Financial Resource Strain: Not on file  Food Insecurity: Not on file  Transportation Needs: Not on file  Physical Activity: Not on file  Stress: Not on file  Social Connections: Not on file  Intimate Partner Violence: Not on file    Family History  Problem Relation Age of Onset   OCD Mother    Diabetes Mother    Depression Mother    Anxiety  disorder Mother    Obesity Mother    Obesity Father    Crohn's disease Father    OCD Sister    Depression Sister    Ulcerative colitis Sister    OCD Brother    Anxiety disorder Brother    Diverticulitis Brother     Past Medical History:  Diagnosis Date   Anemia    Anxiety    Back pain    Depression    Fatty liver    GERD (gastroesophageal reflux disease)    Headache 01/04/2015   Hx of cholecystectomy    Hypertension    Intercranial Hypertension   IBS (irritable bowel syndrome)    Joint pain    Obsessive compulsive disorder 11/2012   Ostomy nurse consultation    Pancreatitis    hx of    UC (ulcerative colitis) ([place])     Past Surgical History:  Procedure Laterality Date   CHOLECYSTECTOMY     LAPAROSCOPIC PARTIAL PROTECTOMY     PERMANENT ILEOSTOMY     TOTAL COLECTOMY N/A     Current Outpatient Medications  Medication Sig Dispense Refill   acetaZOLAMIDE ER (DIAMOX) 500 MG capsule Take 2 capsules (1,000 mg total) by mouth at bedtime. 180 capsule 3   ALPRAZolam (XANAX) 0.5 MG tablet as needed.     Incontinence Supplies (DRAINABLE FECAL COLLECTOR) MISC by Does not apply route.     methylphenidate (RITALIN) 10 MG tablet Take 1.5 tablets (15 mg total) by mouth daily. 45 tablet 0   OLANZapine-FLUoxetine (SYMBYAX) 6-50 MG capsule Take 1 capsule by mouth daily. 90 capsule 0   Ostomy Supplies MISC by Does not apply route.     pantoprazole (PROTONIX) 40 MG tablet Take 40 mg by mouth daily.     vortioxetine HBr (TRINTELLIX) 10 MG TABS tablet Take 1 tablet (10 mg total) by mouth daily. 90 tablet 0   No current  facility-administered medications for this visit.    Allergies as of 12/09/2021 - Review Complete 12/09/2021  Allergen Reaction Noted   Nsaids Other (See Comments) 07/17/2014   Tolmetin Other (See Comments) 10/14/2014    Vitals: BP 121/75   Pulse 75   Ht _0  (1.575 m)   Wt 227 lb 6.4 oz (103.1 kg)   SpO2 97%   BMI 41.59 kg/m  Last Weight:  Wt Readings from Last 1 Encounters:  12/09/21 227 lb 6.4 oz (103.1 kg)   Last Height:   Ht Readings from Last 1 Encounters:  12/09/21 _1  (1.575 m)   Physical exam: Exam: Gen: NAD, conversant, well nourised, obese, well groomed                     CV: RRR, no MRG. No Carotid Bruits. No peripheral edema, warm, nontender Eyes: Conjunctivae clear without exudates or hemorrhage  Neuro: Detailed Neurologic Exam  Speech:    Speech is normal; fluent and spontaneous with normal comprehension.  Cognition:    The patient is oriented to person, place, and time;     recent and remote memory intact;     language fluent;     normal attention, concentration,     fund of knowledge Cranial Nerves:    The pupils are equal, round, and reactive to light. The fundi are normal and spontaneous venous pulsations are present. Visual fields are full to finger confrontation. Extraocular movements are intact. Trigeminal sensation is intact and the muscles of mastication are normal. The face is symmetric. The palate elevates in  the midline. Hearing intact. Voice is normal. Shoulder shrug is normal. The tongue has normal motion without fasciculations.   Coordination:    Normal   Gait:     normal.   Motor Observation:    No asymmetry, no atrophy, and no involuntary movements noted. Tone:    Normal muscle tone.    Posture:    Posture is normal. normal erect    Strength:    Strength is V/V in the upper and lower limbs.      Sensation: intact to LT      Assessment/Plan:  23 old female with worseninf papilledema, dizziness, positional headache,  vision and hearing changes, obesity.  Opening pressure was 34.5, improved on diamox. Abnormal MRI with abnormal signal within the medial temporal lobes bilaterally predominantly involving the hippocampal structures, stable on multiple repeat MRIs.  -She has reduced her diamox to 2 at bedtime 1034m at bedtime(from 20056ma day) since she lost weight and doing great, no vision problems, no headaches. Since she has lost 30 pounds and doing well on lower diamox dose will keep her here. Can try to decrease to 1 pill 500108mt bedtime and if no symptoms stay there or if more weight loss can try stopping altogether and just watching for symptoms. - Talk to obgyn about diamox, they may have to perform more U/S testing for amniotic fluid but diamox can be taken in pregnancy.   -Recommend following up with me in 4 months and Dr. GroKaty Fitch 6 months especilly if you decie to stop the diamox to see what happens.  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 Repeat MRi showed subtle signal changes in the mesial temporal lobes unchanged, although initially worrisome for an infectious or limbic encephalopathy, the stability and the absence of specific clinical symptoms make this less likely.  -For her migraine she is doing well on the Ajovy works mostly all month but she does have breakthrough when is time for her injection.  We did discuss do not get pregnant on this need to be off of it for 5 months if considering pregnancy  -Acutely since she has migraines infrequently she can take Maxalt, Nurtec, Tylenol acutely she can even take all 3 together for severe  headache.   - AArie Sabinar migraines: tried topamax, diamox, amitriptyline   Discussed weight loss gain as is critical and discussed weight in the pathophysiology of IIH; refer to Bariatric surgery, bariatric surgery is indicated due to her condition Continue diamox. Discussed again teratogenicity, she is getting married next year, advised weight loss before children, although diamox is generally considered compatible with pregnancy no medication is entirely safe. IhaJaneal Holmesvised we can decrease the medication if she loses 10% of body weight and we may be able to entirely stop the diamox with a healthy weight.   - Should proceed to ED if vision significantly worsens - Follow ups with Dr. GroKaty Fitchve been very positive, she sees him regularly - She is in the process of bariatric surgery - declined it due to her GI problems - maxalt acutely. For migraines, continue - Migraines: conitnue ajovy  No orders of the  defined types were placed in this encounter.  No orders of the defined types were placed in this encounter.  I spent 40 minutes of face-to-face and non-face-to-face time with patient on the  No diagnosis found.   diagnosis.  This included previsit chart review, lab review, study review, order entry, electronic health record documentation, patient education on the different diagnostic and therapeutic options, counseling and coordination of care, risks and benefits of management, compliance, or risk factor reduction   Sarina Ill, MD  [facility] [address] [place], [place] [address]  Phone [phone] Fax [phone]  I spent 40 minutes of face-to-face and non-face-to-face time with patient on the  No diagnosis found.  diagnosis.  This included previsit chart review, lab review, study review, order entry, electronic health record documentation, patient education on the different diagnostic and therapeutic options, counseling and coordination of  care, risks and benefits of management, compliance, or risk factor reduction

## 2021-12-12 MED ORDER — ACETAZOLAMIDE ER 500 MG PO CP12: 1000.0000 mg | ORAL_CAPSULE | Freq: Every evening | ORAL | 3 refills | Status: DC

## 2021-12-23 ENCOUNTER — Telehealth (HOSPITAL_COMMUNITY): Payer: Self-pay

## 2021-12-23 DIAGNOSIS — F900 Attention-deficit hyperactivity disorder, predominantly inattentive type: Secondary | ICD-10-CM

## 2021-12-23 MED ORDER — METHYLPHENIDATE HCL 10 MG PO TABS: 15.0000 mg | ORAL_TABLET | Freq: Every day | ORAL | 0 refills | Status: DC

## 2021-12-23 NOTE — Telephone Encounter (Signed)
Done

## 2021-12-23 NOTE — Telephone Encounter (Signed)
Medication managment - Telephone message from pt she is in need of a new Methylphenidate (Ritalin) 10 mg order to be sent into her [facility] on [address], last provided 11/08/21 and pt returns next on 02/07/22.

## 2021-12-23 NOTE — Telephone Encounter (Signed)
Medication management - Message left for patient that Dr. Adele Schilder had sent in her new requested order to her [facility] and requested pt call back if any other issues or conerns.

## 2022-01-03 ENCOUNTER — Encounter: Payer: Managed Care, Other (non HMO) | Admitting: Nurse Practitioner

## 2022-01-24 ENCOUNTER — Telehealth (HOSPITAL_COMMUNITY): Payer: Self-pay | Admitting: *Deleted

## 2022-01-24 DIAGNOSIS — F900 Attention-deficit hyperactivity disorder, predominantly inattentive type: Secondary | ICD-10-CM

## 2022-01-24 MED ORDER — METHYLPHENIDATE HCL 10 MG PO TABS: 15.0000 mg | ORAL_TABLET | Freq: Every day | ORAL | 0 refills | Status: DC

## 2022-01-24 NOTE — Telephone Encounter (Signed)
Pt called requesting a refill of the Ritalin 10 mg, last filled on 12/26/21. Pt has an upcoming appointment on 02/07/22.  Please review.

## 2022-01-24 NOTE — Telephone Encounter (Signed)
Done

## 2022-01-26 ENCOUNTER — Other Ambulatory Visit (HOSPITAL_COMMUNITY): Payer: Self-pay | Admitting: Psychiatry

## 2022-01-26 ENCOUNTER — Telehealth (HOSPITAL_COMMUNITY): Payer: Self-pay | Admitting: *Deleted

## 2022-01-26 DIAGNOSIS — F330 Major depressive disorder, recurrent, mild: Secondary | ICD-10-CM

## 2022-01-26 DIAGNOSIS — F429 Obsessive-compulsive disorder, unspecified: Secondary | ICD-10-CM

## 2022-01-26 DIAGNOSIS — F900 Attention-deficit hyperactivity disorder, predominantly inattentive type: Secondary | ICD-10-CM

## 2022-01-26 MED ORDER — METHYLPHENIDATE HCL 10 MG PO TABS: 15.0000 mg | ORAL_TABLET | Freq: Every day | ORAL | 0 refills | Status: DC

## 2022-01-26 NOTE — Telephone Encounter (Signed)
[facility].

## 2022-01-26 NOTE — Telephone Encounter (Signed)
Pt called requesting Rx for Ritalin 10 mg #45, [facility] [address] (have added to profile) as [facility] [address] [facility] on [facility] medication. Writer verified with [facility] and they have cx previous order. Please review. Pt appointment scheduled for 02/07/22.

## 2022-02-07 ENCOUNTER — Telehealth (HOSPITAL_BASED_OUTPATIENT_CLINIC_OR_DEPARTMENT_OTHER): Payer: 59 | Admitting: Psychiatry

## 2022-02-07 ENCOUNTER — Encounter (HOSPITAL_COMMUNITY): Payer: Self-pay | Admitting: Psychiatry

## 2022-02-07 VITALS — Wt 227.0 lb

## 2022-02-07 DIAGNOSIS — F429 Obsessive-compulsive disorder, unspecified: Secondary | ICD-10-CM | POA: Diagnosis not present

## 2022-02-07 DIAGNOSIS — F410 Panic disorder [episodic paroxysmal anxiety] without agoraphobia: Secondary | ICD-10-CM

## 2022-02-07 DIAGNOSIS — F330 Major depressive disorder, recurrent, mild: Secondary | ICD-10-CM

## 2022-02-07 DIAGNOSIS — F900 Attention-deficit hyperactivity disorder, predominantly inattentive type: Secondary | ICD-10-CM

## 2022-02-07 MED ORDER — ALPRAZOLAM 0.5 MG PO TABS: ORAL_TABLET | ORAL | 0 refills | Status: DC

## 2022-02-07 MED ORDER — METHYLPHENIDATE HCL 10 MG PO TABS: 15.0000 mg | ORAL_TABLET | Freq: Every day | ORAL | 0 refills | Status: DC

## 2022-02-07 MED ORDER — VORTIOXETINE HBR 10 MG PO TABS: 10.0000 mg | ORAL_TABLET | Freq: Every day | ORAL | 0 refills | Status: DC

## 2022-02-07 MED ORDER — OLANZAPINE-FLUOXETINE HCL 6-50 MG PO CAPS: 1.0000 | ORAL_CAPSULE | Freq: Every day | ORAL | 0 refills | Status: DC

## 2022-02-07 NOTE — Progress Notes (Signed)
Virtual Visit via Telephone Note  I connected with Diana Page on 02/07/22 at  3:20 PM EDT by telephone and verified that I am speaking with the correct person using two identifiers.  Location: Patient: Home Provider: Home Office   I discussed the limitations, risks, security and privacy concerns of performing an evaluation and management service by telephone and the availability of in person appointments. I also discussed with the patient that there may be a patient responsible charge related to this service. The patient expressed understanding and agreed to proceed.   History of Present Illness: Patient is evaluated by phone session.  She is taking all her medication and reported things are going well however she had a severe panic attack 2 days ago when she is thinking about school starting, traveling and also to attend a wedding in [place].  She has leftover Xanax which was prescribed in 2021.  She had not required Xanax since then but like to have a few in case she had panic attacks.  Overall she feels things are going okay.  Her husband are very supportive.  Her school starts tomorrow on line.  She does not want to change the medication since it is working very well.  She denies any obsessive thoughts, crying spells or any feeling of hopelessness or worthlessness.  Her appetite is okay.  Recently she had blisters and she was given medication which is helping the blisters.  She has no tremor or shakes or any EPS.  Her attention and focus is good.  She liked the Ritalin as able to do multitasking and time management.   Past Psychiatric History:       H/O depression and OCD.  Tried Prozac in [place].  No h/o hallucination, paranoia, suicidal attempt or inpatient treatment.  Symbyax worked. Tried Xanax as needed. Luvox caused sexual side effects and Wellbutrin did not helped.Vyvanse expensive.      Psychiatric Specialty Exam: Physical Exam  Review of Systems  Weight 227 lb (103 kg).There  is no height or weight on file to calculate BMI.  General Appearance: NA  Eye Contact:  NA  Speech:  Clear and Coherent and Normal Rate  Volume:  Normal  Mood:  Anxious  Affect:  NA  Thought Process:  Goal Directed  Orientation:  Full (Time, Place, and Person)  Thought Content:  WDL  Suicidal Thoughts:  No  Homicidal Thoughts:  No  Memory:  Immediate;   Good Recent;   Good Remote;   Good  Judgement:  Good  Insight:  Good  Psychomotor Activity:  NA  Concentration:  Concentration: Good and Attention Span: Good  Recall:  Good  Fund of Knowledge:  Good  Language:  Good  Akathisia:  No  Handed:  Right  AIMS (if indicated):     Assets:  Communication Skills Desire for Improvement Housing Resilience Social Support Talents/Skills Transportation  ADL's:  Intact  Cognition:  WNL  Sleep:   ok      Assessment and Plan: Obsessive-compulsive disorder.  Major depressive disorder, recurrent.  ADHD, inattentive type.  Panic attacks.  Patient is stable on her current medication.  We talk about panic attacks which she feels triggered by her upcoming school starting, traveling and attending a wedding.  She do not have Xanax because prescriptions were old and expired.  We will provide 7 tablets of Xanax 0.5 mg that she can take only as needed for severe panic attack and usually it lasts for more than a year.  Patient like to keep her other medication as prescribed.  Continue Trintellix 10 mg daily, Symbyax 6/50 mg daily, Ritalin 10 mg in the morning and 5 at noon time.  Recommended to call us back if she has any question or any concern.  Follow-up in 3 months.  Follow Up Instructions:    I discussed the assessment and treatment plan with the patient. The patient was provided an opportunity to ask questions and all were answered. The patient agreed with the plan and demonstrated an understanding of the instructions.   The patient was advised to call back or seek an in-person evaluation if  the symptoms worsen or if the condition fails to improve as anticipated.  Collaboration of Care: Primary Care Provider AEB notes are available in epic to review.  Patient/Guardian was advised Release of Information must be obtained prior to any record release in order to collaborate their care with an outside provider. Patient/Guardian was advised if they have not already done so to contact the registration department to sign all necessary forms in order for [place] to release information regarding their care.   Consent: Patient/Guardian gives verbal consent for treatment and assignment of benefits for services provided during this visit. Patient/Guardian expressed understanding and agreed to proceed.    I provided 23 minutes of non-face-to-face time during this encounter.   Kathlee Nations, MD

## 2022-03-16 ENCOUNTER — Encounter: Payer: Self-pay | Admitting: Neurology

## 2022-03-16 MED ORDER — RIZATRIPTAN BENZOATE 10 MG PO TBDP: 10.0000 mg | ORAL_TABLET | ORAL | 1 refills | Status: DC | PRN

## 2022-03-16 NOTE — Telephone Encounter (Signed)
Diana Beam, MD  [facility] Calls 12 minutes ago (11:08 AM)    She can try the tramadol and we can send her in a triptan like rizatripan. Please discuss with ehr.

## 2022-03-16 NOTE — Addendum Note (Signed)
Addended by: Gildardo Griffes on: 03/16/2022 05:15 PM   Modules accepted: Orders

## 2022-03-16 NOTE — Telephone Encounter (Signed)
Per v.o  [name], Rizatriptan 10 mg prescription sent to pharmacy.

## 2022-03-28 ENCOUNTER — Telehealth (HOSPITAL_COMMUNITY): Payer: Self-pay

## 2022-03-28 DIAGNOSIS — F900 Attention-deficit hyperactivity disorder, predominantly inattentive type: Secondary | ICD-10-CM

## 2022-03-28 MED ORDER — METHYLPHENIDATE HCL 10 MG PO TABS: 15.0000 mg | ORAL_TABLET | Freq: Every day | ORAL | 0 refills | Status: DC

## 2022-03-28 NOTE — Telephone Encounter (Signed)
Patient called requesting a refill  Last appt 02/07/22 Next appt 05/09/22  methylphenidate (RITALIN) 10 MG tablet 45 tablet 0 02/07/2022 02/07/2023   Sig - Route: Take 1.5 tablets (15 mg total) by mouth daily. - Oral   Sent to pharmacy as: methylphenidate (RITALIN) 10 MG tablet   Earliest Fill Date: 02/07/2022   Notes to Pharmacy: Do not refill until 02/26/2022   E-Prescribing Status: Receipt confirmed by pharmacy (02/07/2022  3:41 PM EDT)

## 2022-03-28 NOTE — Telephone Encounter (Signed)
Done

## 2022-04-27 ENCOUNTER — Telehealth (HOSPITAL_COMMUNITY): Payer: Self-pay | Admitting: *Deleted

## 2022-04-27 DIAGNOSIS — F900 Attention-deficit hyperactivity disorder, predominantly inattentive type: Secondary | ICD-10-CM

## 2022-04-27 MED ORDER — METHYLPHENIDATE HCL 10 MG PO TABS: 15.0000 mg | ORAL_TABLET | Freq: Every day | ORAL | 0 refills | Status: DC

## 2022-04-27 NOTE — Telephone Encounter (Signed)
Done

## 2022-04-27 NOTE — Telephone Encounter (Signed)
Pt called requesting a refill of the Ritalin 10 mg qd, last e-scribed 03/28/22. Pt states that she is completely out and needs fill today. Pt has a scheduled appointment on 05/09/22. Please review.

## 2022-04-29 ENCOUNTER — Encounter: Payer: Self-pay | Admitting: Neurology

## 2022-05-09 ENCOUNTER — Telehealth (HOSPITAL_BASED_OUTPATIENT_CLINIC_OR_DEPARTMENT_OTHER): Payer: 59 | Admitting: Psychiatry

## 2022-05-09 ENCOUNTER — Encounter (HOSPITAL_COMMUNITY): Payer: Self-pay | Admitting: Psychiatry

## 2022-05-09 DIAGNOSIS — F410 Panic disorder [episodic paroxysmal anxiety] without agoraphobia: Secondary | ICD-10-CM | POA: Diagnosis not present

## 2022-05-09 DIAGNOSIS — F429 Obsessive-compulsive disorder, unspecified: Secondary | ICD-10-CM

## 2022-05-09 DIAGNOSIS — F330 Major depressive disorder, recurrent, mild: Secondary | ICD-10-CM | POA: Diagnosis not present

## 2022-05-09 DIAGNOSIS — F900 Attention-deficit hyperactivity disorder, predominantly inattentive type: Secondary | ICD-10-CM | POA: Diagnosis not present

## 2022-05-09 MED ORDER — VORTIOXETINE HBR 10 MG PO TABS: 10.0000 mg | ORAL_TABLET | Freq: Every day | ORAL | 0 refills | Status: DC

## 2022-05-09 MED ORDER — ALPRAZOLAM 0.5 MG PO TABS: ORAL_TABLET | ORAL | 0 refills | Status: DC

## 2022-05-09 MED ORDER — OLANZAPINE-FLUOXETINE HCL 6-50 MG PO CAPS: 1.0000 | ORAL_CAPSULE | Freq: Every day | ORAL | 0 refills | Status: DC

## 2022-05-09 MED ORDER — METHYLPHENIDATE HCL 10 MG PO TABS: 15.0000 mg | ORAL_TABLET | Freq: Every day | ORAL | 0 refills | Status: DC

## 2022-05-09 NOTE — Progress Notes (Signed)
Virtual Visit via Telephone Note  I connected with Valeen Borys on 05/09/22 at  3:20 PM EST by telephone and verified that I am speaking with the correct person using two identifiers.  Location: Patient: Home Provider: Home Office   I discussed the limitations, risks, security and privacy concerns of performing an evaluation and management service by telephone and the availability of in person appointments. I also discussed with the patient that there may be a patient responsible charge related to this service. The patient expressed understanding and agreed to proceed.   History of Present Illness: Patient is evaluated by phone session.  She reported increased anxiety because of the school.  Her grades are okay but she reported a lot of reading and study.  She is taking classes online.  Patient reported semester is ending but she has 2 more semesters and hoping to graduate next year.  She had a good support from her husband.  Patient told traveling to [place] and the holidays with the husband.  She sleeps good.  She reported her OCD is under control and denies any mania or any depressive symptoms.  She denies any crying spells or any feeling of hopelessness or worthlessness.  Her appetite is okay.  Her weight is unchanged from the past.  However she admitted multiple panic attacks and she had used Xanax which was given 3 months ago.  She was given 7 tablets and she has only 1 left.  She denies drinking or using any illegal substances.  She wants to keep the current medication.  She had appointment with the PCP in February and that is the earliest she can get appointment.  Past Psychiatric History:       H/O depression and OCD.  Tried Prozac in [place].  No h/o hallucination, paranoia, suicidal attempt or inpatient treatment.  Symbyax worked. Tried Xanax as needed. Luvox caused sexual side effects and Wellbutrin did not helped.Vyvanse expensive.      Psychiatric Specialty Exam: Physical Exam   Review of Systems  Weight 230 lb (104.3 kg).There is no height or weight on file to calculate BMI.  General Appearance: NA  Eye Contact:  NA  Speech:  Clear and Coherent  Volume:  Normal  Mood:  Anxious  Affect:  NA  Thought Process:  Goal Directed  Orientation:  Full (Time, Place, and Person)  Thought Content:  WDL  Suicidal Thoughts:  No  Homicidal Thoughts:  No  Memory:  Immediate;   Good Recent;   Good Remote;   Good  Judgement:  Good  Insight:  Good  Psychomotor Activity:  NA  Concentration:  Concentration: Good and Attention Span: Good  Recall:  Good  Fund of Knowledge:  Good  Language:  Good  Akathisia:  No  Handed:  Right  AIMS (if indicated):     Assets:  Communication Skills Desire for Improvement Housing Resilience Social Support Talents/Skills Transportation  ADL's:  Intact  Cognition:  WNL  Sleep:   ok      Assessment and Plan: Obsessive-compulsive disorder.  Major depressive disorder, recurrent.  ADHD, inattentive type.  Panic attacks.  Discuss her anxiety related to her school.  I explained if she noticed anxiety not under control then we may need to reduce her Ritalin that may be contributing to her anxiety.  Patient is hoping it is situational and get under control once [facility] [place] is finished.  We will provide Xanax # 7 tablet 0.5 mg to take as needed for panic attack.  Symbyax 6/50 mg daily, Ritalin 10 mg in the morning and 5 at noon time and Trintellix 10 mg daily.  Recommended to call us back if she has any question or any concern.  Follow-up in 3 months.  Follow Up Instructions:    I discussed the assessment and treatment plan with the patient. The patient was provided an opportunity to ask questions and all were answered. The patient agreed with the plan and demonstrated an understanding of the instructions.   The patient was advised to call back or seek an in-person evaluation if the symptoms worsen or if the condition fails to improve as  anticipated.  Collaboration of Care: Other provider involved in patient's care AEB notes are available in epic to review.  Patient/Guardian was advised Release of Information must be obtained prior to any record release in order to collaborate their care with an outside provider. Patient/Guardian was advised if they have not already done so to contact the registration department to sign all necessary forms in order for [place] to release information regarding their care.   Consent: Patient/Guardian gives verbal consent for treatment and assignment of benefits for services provided during this visit. Patient/Guardian expressed understanding and agreed to proceed.    I provided 18 minutes of non-face-to-face time during this encounter.   Kathlee Nations, MD

## 2022-06-07 ENCOUNTER — Ambulatory Visit: Payer: Self-pay | Admitting: Orthopaedic Surgery

## 2022-06-28 ENCOUNTER — Telehealth (HOSPITAL_COMMUNITY): Payer: Self-pay | Admitting: *Deleted

## 2022-06-28 DIAGNOSIS — F900 Attention-deficit hyperactivity disorder, predominantly inattentive type: Secondary | ICD-10-CM

## 2022-06-28 MED ORDER — METHYLPHENIDATE HCL 10 MG PO TABS: 15.0000 mg | ORAL_TABLET | Freq: Every day | ORAL | 0 refills | Status: DC

## 2022-06-28 NOTE — Telephone Encounter (Signed)
Patient called asking for refill of Ritalin. Next appointment scheduled for 08/08/22 with Arfeen.

## 2022-06-28 NOTE — Telephone Encounter (Signed)
Done

## 2022-07-28 ENCOUNTER — Telehealth (HOSPITAL_COMMUNITY): Payer: Self-pay | Admitting: *Deleted

## 2022-07-28 DIAGNOSIS — F900 Attention-deficit hyperactivity disorder, predominantly inattentive type: Secondary | ICD-10-CM

## 2022-07-28 MED ORDER — METHYLPHENIDATE HCL 10 MG PO TABS: 15.0000 mg | ORAL_TABLET | Freq: Every day | ORAL | 0 refills | Status: DC

## 2022-07-28 NOTE — Telephone Encounter (Signed)
Pt called requesting refill of Ritalin 10 mg takes 1.5 tabs (15 mg total) QD. Last seen on 05/09/22 and last script sent on 06/28/22 for #45. Pt next appointment scheduled for 08/08/22. Pt states she has 1 tablet left. Please review.

## 2022-07-28 NOTE — Telephone Encounter (Signed)
Done

## 2022-07-29 ENCOUNTER — Other Ambulatory Visit (HOSPITAL_COMMUNITY): Payer: Self-pay | Admitting: Psychiatry

## 2022-07-29 DIAGNOSIS — F429 Obsessive-compulsive disorder, unspecified: Secondary | ICD-10-CM

## 2022-07-29 DIAGNOSIS — F330 Major depressive disorder, recurrent, mild: Secondary | ICD-10-CM

## 2022-08-07 ENCOUNTER — Emergency Department (HOSPITAL_COMMUNITY): Payer: Managed Care, Other (non HMO)

## 2022-08-07 ENCOUNTER — Emergency Department (HOSPITAL_COMMUNITY)
Admission: EM | Admit: 2022-08-07 | Discharge: 2022-08-07 | Disposition: A | Discharge status: Home / Self Care | Payer: Managed Care, Other (non HMO) | Attending: Emergency Medicine | Admitting: Emergency Medicine

## 2022-08-07 ENCOUNTER — Other Ambulatory Visit: Payer: Self-pay

## 2022-08-07 ENCOUNTER — Encounter (HOSPITAL_COMMUNITY): Payer: Self-pay | Admitting: *Deleted

## 2022-08-07 DIAGNOSIS — D75839 Thrombocytosis, unspecified: Secondary | ICD-10-CM

## 2022-08-07 DIAGNOSIS — I10 Essential (primary) hypertension: Secondary | ICD-10-CM | POA: Diagnosis not present

## 2022-08-07 DIAGNOSIS — R748 Abnormal levels of other serum enzymes: Secondary | ICD-10-CM

## 2022-08-07 DIAGNOSIS — R109 Unspecified abdominal pain: Secondary | ICD-10-CM

## 2022-08-07 DIAGNOSIS — E876 Hypokalemia: Secondary | ICD-10-CM

## 2022-08-07 DIAGNOSIS — R112 Nausea with vomiting, unspecified: Secondary | ICD-10-CM | POA: Diagnosis not present

## 2022-08-07 DIAGNOSIS — D649 Anemia, unspecified: Secondary | ICD-10-CM | POA: Diagnosis not present

## 2022-08-07 LAB — CBC
HCT: 36.7 % (ref 36.0–46.0)
Hemoglobin: 11.4 g/dL — ABNORMAL LOW (ref 12.0–15.0)
MCH: 23.9 pg — ABNORMAL LOW (ref 26.0–34.0)
MCHC: 31.1 g/dL (ref 30.0–36.0)
MCV: 77.1 fL — ABNORMAL LOW (ref 80.0–100.0)
Platelets: 423 10*3/uL — ABNORMAL HIGH (ref 150–400)
RBC: 4.76 MIL/uL (ref 3.87–5.11)
RDW: 14.7 % (ref 11.5–15.5)
WBC: 11.7 10*3/uL — ABNORMAL HIGH (ref 4.0–10.5)
nRBC: 0 % (ref 0.0–0.2)

## 2022-08-07 LAB — I-STAT BETA HCG BLOOD, ED (MC, WL, AP ONLY)
I-stat hCG, quantitative: <5 m[IU]/mL (ref <5)
I-stat hCG, quantitative: <5 m[IU]/mL (ref <5)

## 2022-08-07 LAB — COMPREHENSIVE METABOLIC PANEL
ALT: 39 U/L (ref 0–44)
AST: 42 U/L — ABNORMAL HIGH (ref 15–41)
Albumin: 3.9 g/dL (ref 3.5–5.0)
Alkaline Phosphatase: 162 U/L — ABNORMAL HIGH (ref 38–126)
Anion gap: 13 (ref 5–15)
BUN: 13 mg/dL (ref 6–20)
CO2: 18 mmol/L — ABNORMAL LOW (ref 22–32)
Calcium: 9.1 mg/dL (ref 8.9–10.3)
Chloride: 105 mmol/L (ref 98–111)
Creatinine, Ser: 1.08 mg/dL — ABNORMAL HIGH (ref 0.44–1.00)
GFR, Estimated: >60 mL/min (ref >60)
Glucose, Bld: 132 mg/dL — ABNORMAL HIGH (ref 70–99)
Potassium: 3.3 mmol/L — ABNORMAL LOW (ref 3.5–5.1)
Sodium: 136 mmol/L (ref 135–145)
Total Bilirubin: 0.3 mg/dL (ref 0.3–1.2)
Total Protein: 7.6 g/dL (ref 6.5–8.1)

## 2022-08-07 LAB — URINALYSIS, ROUTINE W REFLEX MICROSCOPIC
Bilirubin Urine: NEGATIVE
Glucose, UA: NEGATIVE mg/dL
Hgb urine dipstick: NEGATIVE
Ketones, ur: NEGATIVE mg/dL
Nitrite: NEGATIVE
Protein, ur: NEGATIVE mg/dL
Specific Gravity, Urine: >1.046 — ABNORMAL HIGH (ref 1.005–1.030)
pH: 5 (ref 5.0–8.0)

## 2022-08-07 LAB — LIPASE, BLOOD: Lipase: 32 U/L (ref 11–51)

## 2022-08-07 MED ORDER — MORPHINE SULFATE (PF) 4 MG/ML IV SOLN
4.0000 mg | Freq: Once | INTRAVENOUS | Status: AC
  Administered 2022-08-07: 4 mg via INTRAVENOUS
  Filled 2022-08-07: qty 1

## 2022-08-07 MED ORDER — SODIUM CHLORIDE 0.9 % IV BOLUS
1000.0000 mL | Freq: Once | INTRAVENOUS | Status: AC
  Administered 2022-08-07: 1000 mL via INTRAVENOUS

## 2022-08-07 MED ORDER — ONDANSETRON HCL 4 MG/2ML IJ SOLN
4.0000 mg | Freq: Once | INTRAMUSCULAR | Status: AC
  Administered 2022-08-07: 4 mg via INTRAVENOUS
  Filled 2022-08-07: qty 2

## 2022-08-07 MED ORDER — IOHEXOL 350 MG/ML SOLN
75.0000 mL | Freq: Once | INTRAVENOUS | Status: AC | PRN
  Administered 2022-08-07: 75 mL via INTRAVENOUS

## 2022-08-07 MED ORDER — HYDROCODONE-ACETAMINOPHEN 5-325 MG PO TABS: 1.0000 | ORAL_TABLET | ORAL | 0 refills | Status: DC | PRN

## 2022-08-07 NOTE — ED Triage Notes (Signed)
The pt has a permanent illeostomy  since last pm she has had some pain around that area and she has had vomiting  she has had this previously and it caused by something getting stuck in that area   lmp one week ago

## 2022-08-07 NOTE — ED Provider Notes (Signed)
[facility] Provider Note   CSN: [id number] Arrival date & time: 08/07/22  0141     History  Chief Complaint  Patient presents with   Abdominal Pain    Diana Page is a 32 y.o. female.  The history is provided by the patient.  Abdominal Pain She has history of ulcerative colitis status post total colectomy end ileostomy, hypertension, pancreatitis and comes in because of abdominal pain with vomiting and decreased output from her ileostomy.  She ate a dinner, but started having generalized abdominal pain following dinner and noted decreased output from her ileostomy.  She then had nausea and vomiting.  Pain has subsided somewhat and she has had slight output since arriving in the ED, but continues to have nausea.  She also thinks there might be some swelling around her ileostomy.    Home Medications Prior to Admission medications   Medication Sig Start Date End Date Taking? Authorizing Provider  acetaZOLAMIDE ER (DIAMOX) 500 MG capsule Take 2 capsules (1,000 mg total) by mouth at bedtime. 12/12/21   [name], MD  ALPRAZolam Duanne Moron) 0.5 MG tablet Take one tab as needed for severe panic attacks. 05/09/22   Arfeen, Arlyce Harman, MD  Incontinence Supplies (DRAINABLE FECAL COLLECTOR) MISC by Does not apply route. 09/06/14   [provider]  methylphenidate (RITALIN) 10 MG tablet Take 1.5 tablets (15 mg total) by mouth daily. 07/28/22 07/28/23  Arfeen, Arlyce Harman, MD  OLANZapine-FLUoxetine (SYMBYAX) 6-50 MG capsule Take 1 capsule by mouth daily. 05/09/22   Arfeen, Arlyce Harman, MD  Ostomy Supplies MISC by Does not apply route. 09/06/14   [provider]  pantoprazole (PROTONIX) 40 MG tablet Take 40 mg by mouth daily.    [provider]  rizatriptan (MAXALT-MLT) 10 MG disintegrating tablet Take 1 tablet (10 mg total) by mouth as needed for migraine. May repeat in 2 hours if needed. Max 2 tablets in 24 hours. 03/16/22   [name], MD  vortioxetine HBr (TRINTELLIX) 10 MG TABS tablet Take 1 tablet (10 mg total) by mouth daily. 05/09/22   Arfeen, Arlyce Harman, MD      Allergies    Nsaids and Tolmetin    Review of Systems   Review of Systems  Gastrointestinal:  Positive for abdominal pain.  All other systems reviewed and are negative.   Physical Exam Updated Vital Signs BP (!) 159/92   Pulse (!) 103   Temp 98.1 F (36.7 C)   Resp 18   Ht 5' 2"$  (1.575 m)   Wt 104.3 kg   LMP 07/31/2022   SpO2 99%   BMI 42.06 kg/m  Physical Exam Vitals and nursing note reviewed.   32 year old female, resting comfortably and in no acute distress. Vital signs are significant for borderline elevated heart rate and moderately elevated blood pressure. Oxygen saturation is 99%, which is normal. Head is normocephalic and atraumatic. PERRLA, EOMI. Oropharynx is clear. Neck is nontender and supple without adenopathy or JVD. Back is nontender and there is no CVA tenderness. Lungs are clear without rales, wheezes, or rhonchi. Chest is nontender. Heart has regular rate and rhythm without murmur. Abdomen is soft, flat, with mild tenderness diffusely.  Ileostomy is present in the mid abdomen with mild tenderness around the site but no evidence of stomal hernia. Extremities have 1+ edema, full range of motion is present. Skin is warm and dry without rash. Neurologic: Mental status is normal, cranial nerves are  intact, moves all extremities equally.  ED Results / Procedures / Treatments   Labs (all labs ordered are listed, but only abnormal results are displayed) Labs Reviewed  COMPREHENSIVE METABOLIC PANEL - Abnormal; Notable for the following components:      Result Value   Potassium 3.3 (*)    CO2 18 (*)    Glucose, Bld 132 (*)    Creatinine, Ser 1.08 (*)    AST 42 (*)    Alkaline Phosphatase 162 (*)    All other components within normal limits  CBC - Abnormal; Notable for the following components:   WBC 11.7 (*)     Hemoglobin 11.4 (*)    MCV 77.1 (*)    MCH 23.9 (*)    Platelets 423 (*)    All other components within normal limits  LIPASE, BLOOD  URINALYSIS, ROUTINE W REFLEX MICROSCOPIC  I-STAT BETA HCG BLOOD, ED ([facility], [facility], [facility] ONLY)   Radiology CT ABDOMEN PELVIS W CONTRAST  Result Date: 08/07/2022 CLINICAL DATA:  Pain around ileostomy site. Vomiting. Bowel obstruction suspected. EXAM: CT ABDOMEN AND PELVIS WITH CONTRAST TECHNIQUE: Multidetector CT imaging of the abdomen and pelvis was performed using the standard protocol following bolus administration of intravenous contrast. RADIATION DOSE REDUCTION: This exam was performed according to the departmental dose-optimization program which includes automated exposure control, adjustment of the mA and/or kV according to patient size and/or use of iterative reconstruction technique. CONTRAST:  68m OMNIPAQUE IOHEXOL 350 MG/ML SOLN COMPARISON:  06/26/2021 FINDINGS: Lower chest: Minimal ground-glass opacities in the subpleural region of the right lower lobe has mildly increased since prior examination and either relates to chronic inflammation or mild progressive scarring. Hepatobiliary: Status post cholecystectomy. Diffuse low-density of the liver consistent with steatosis. No focal hepatic lesion. No bile duct dilatation. Pancreas: Unremarkable. No pancreatic ductal dilatation or surrounding inflammatory changes. Spleen: Normal in size without focal abnormality. Adrenals/Urinary Tract: Adrenal glands are unremarkable. Kidneys are normal, without renal calculi, focal lesion, or hydronephrosis. Bladder is unremarkable. Stomach/Bowel: No bowel dilatation to indicate ileus or obstruction. Postsurgical changes of total colectomy are again seen. Right lower quadrant ileostomy again seen. There is minimal redundancy of the ileal loop within the subcutaneous tissues, however no mass or hernia is identified. No significant fat stranding to indicate inflammation at the ileostomy  site. Calcifications/suture at the ileostomy site is unchanged from prior examination. Vascular/Lymphatic: No significant vascular findings are present. No enlarged abdominal or pelvic lymph nodes. Reproductive: Uterus and bilateral adnexa are unremarkable. Other: No abdominal wall hernia or abnormality. No abdominopelvic ascites. Musculoskeletal: No acute or significant osseous findings. IMPRESSION: 1. Postsurgical changes of total colectomy and right lower quadrant ileostomy. No evidence of bowel obstruction or ileus. 2. No acute abnormality of the abdomen or pelvis. 3. Hepatic steatosis. Electronically Signed   By: FMiachel RouxM.D.   On: 08/07/2022 07:03    Procedures Procedures    Medications Ordered in ED Medications  morphine (PF) 4 MG/ML injection 4 mg (has no administration in time range)  sodium chloride 0.9 % bolus 1,000 mL (has no administration in time range)  morphine (PF) 4 MG/ML injection 4 mg (4 mg Intravenous Given 08/07/22 0543)  ondansetron (ZOFRAN) injection 4 mg (4 mg Intravenous Given 08/07/22 0543)  iohexol (OMNIPAQUE) 350 MG/ML injection 75 mL (75 mLs Intravenous Contrast Given 08/07/22 0654)    ED Course/ Medical Decision Making/ A&P  Medical Decision Making Amount and/or Complexity of Data Reviewed Radiology: ordered.  Risk Prescription drug management.   Abdominal pain and vomiting with decreased output through ileostomy concerning for possible bowel obstruction.  I have reviewed and interpreted her laboratory tests, my interpretation is mild hypokalemia, metabolic acidosis which is chronic and secondary to ileostomy, chronic elevation of alkaline phosphatase, stable anemia, mild thrombocytosis which is felt to be reactive, mild leukocytosis which is nonspecific.  I have reviewed her past records, and she has several ED visits for abdominal pain, and had been admitted on 06/28/2018 and 1/2/202.  CT scans have not shown any evidence of bowel  obstruction on those occasions.  I have ordered IV morphine, ondansetron and CT of abdomen and pelvis to look for evidence of bowel obstruction.  Following above-noted treatment, nausea is improved but she is continue to complain of abdominal pain.  CT scan shows no acute process.  Have independently viewed the images, and agree with radiologist interpretation.  Patient feels that she needs IV fluids.  Have ordered IV fluids as well as additional morphine.  Case is signed out to Dr. Sherry Ruffing, oncoming physician.  Final Clinical Impression(s) / ED Diagnoses Final diagnoses:  Abdominal pain, unspecified abdominal location  Nausea and vomiting, unspecified vomiting type  Normochromic normocytic anemia  Thrombocytosis  Elevated alkaline phosphatase level  Hypokalemia    Rx / DC Orders ED Discharge Orders     None         Delora Fuel, MD [id number]

## 2022-08-07 NOTE — Discharge Instructions (Signed)
Your imaging today did not show evidence of acute obstruction.  After hydration your symptoms seem to improve.  Please use the home nausea and pain medicine to help with symptoms and follow-up with your primary doctor.  If any symptoms change or worsen acutely, please return to the nearest emergency department.  Your vital signs were overall reassuring and we feel you are safe for discharge home.

## 2022-08-07 NOTE — ED Provider Notes (Signed)
7:22 AM Care assumed for Dr. Roxanne Mins.  At time of transfer of care, patient is awaiting for reassessment after liter of fluids and some more pain medicine.  The patient is feeling better, anticipate discharge home.  CT imaging and labs were overall reassuring and did not show evidence of bowel obstruction today which was the patient's initial concern reportedly.  Anticipate discharge after she is feeling better with a prescription for pain medicine.  8:45 AM Patient reassessed and she is beginning to feel better.  She wanted 1 last round of fluids nausea and pain medicines will give this to her and then reassess with a p.o. challenge.  Anticipate discharge after if she is feeling better.  12:12 PM Patient will get 1 last dose of nausea medicine and she will be discharged per previous plan.  Patient agrees with this plan.  Will send prescription for pain medicine to her pharmacy.   Clinical Impression: 1. Abdominal pain, unspecified abdominal location   2. Nausea and vomiting, unspecified vomiting type   3. Normochromic normocytic anemia   4. Thrombocytosis   5. Elevated alkaline phosphatase level   6. Hypokalemia     Disposition: Discharge  Condition: Good  I have discussed the results, Dx and Tx plan with the pt(& family if present). He/she/they expressed understanding and agree(s) with the plan. Discharge instructions discussed at great length. Strict return precautions discussed and pt &/or family have verbalized understanding of the instructions. No further questions at time of discharge.    New Prescriptions   HYDROCODONE-ACETAMINOPHEN (NORCO/VICODIN) 5-325 MG TABLET    Take 1 tablet by mouth every 4 (four) hours as needed.    Follow Up: Minette Brine, [place] [facility] [address] [place] [address] [facility] [address] [id number] mc [place]         Yarel Kilcrease, Gwenyth Allegra, MD 08/07/22 1214

## 2022-08-08 ENCOUNTER — Encounter (HOSPITAL_COMMUNITY): Payer: Self-pay | Admitting: Psychiatry

## 2022-08-08 ENCOUNTER — Telehealth (HOSPITAL_BASED_OUTPATIENT_CLINIC_OR_DEPARTMENT_OTHER): Payer: 59 | Admitting: Psychiatry

## 2022-08-08 ENCOUNTER — Telehealth: Payer: Self-pay

## 2022-08-08 VITALS — Wt 229.0 lb

## 2022-08-08 DIAGNOSIS — F900 Attention-deficit hyperactivity disorder, predominantly inattentive type: Secondary | ICD-10-CM | POA: Diagnosis not present

## 2022-08-08 DIAGNOSIS — F330 Major depressive disorder, recurrent, mild: Secondary | ICD-10-CM | POA: Diagnosis not present

## 2022-08-08 DIAGNOSIS — F410 Panic disorder [episodic paroxysmal anxiety] without agoraphobia: Secondary | ICD-10-CM | POA: Diagnosis not present

## 2022-08-08 DIAGNOSIS — F429 Obsessive-compulsive disorder, unspecified: Secondary | ICD-10-CM

## 2022-08-08 MED ORDER — VORTIOXETINE HBR 10 MG PO TABS: 10.0000 mg | ORAL_TABLET | Freq: Every day | ORAL | 0 refills | Status: DC

## 2022-08-08 MED ORDER — OLANZAPINE-FLUOXETINE HCL 6-50 MG PO CAPS: 1.0000 | ORAL_CAPSULE | Freq: Every day | ORAL | 0 refills | Status: DC

## 2022-08-08 NOTE — Progress Notes (Signed)
Virtual Visit via Telephone Note  I connected with Diana Page on 08/08/22 at  1:00 PM EST by telephone and verified that I am speaking with the correct person using two identifiers.  Location: Patient: Home Provider: Home Office   I discussed the limitations, risks, security and privacy concerns of performing an evaluation and management service by telephone and the availability of in person appointments. I also discussed with the patient that there may be a patient responsible charge related to this service. The patient expressed understanding and agreed to proceed.   History of Present Illness: Patient is evaluated by phone session.  She had a good holidays however when traveling she was anxious and nervous.  She recently evaluated in the emergency room because of abdominal pain, nausea.  She reported may have eaten too many vegetables that may cause the issue but now she is feeling better.  She was prescribed narcotic pain medication and she took only 1 pill.  She reported her OCD, anxiety and panic attacks are stable.  She is making good grades in school.  This is her last semester and she is doing online but live with soon.  She denies any crying spells or any feeling of hopelessness or worthlessness.  Now she is thinking about having a family and getting consultation liaison to have a IVF.  Patient told it would be a surrogate pregnancy.  She still take occasionally Xanax when she feel very nervous or anxious and last time she took was a week ago.  She has no tremors, shakes or any EPS.  She denies drinking or using any illegal substances.  She sleeps good and her weight is stable.  She has appointment coming up with her primary care on 19th.  She is seeing a person Dr. Redge Gainer for therapy.  She does not want to change the medication.  She is taking Symbyax, Trintellix and Ritalin.  She takes usually 15 mg a day and she also requires on the weekend because it helps her focus, attention and  concentration.    Past Psychiatric History:       H/O depression and OCD.  Tried Prozac in [place].  No h/o hallucination, paranoia, suicidal attempt or inpatient treatment.  Symbyax worked. Tried Xanax as needed. Luvox caused sexual side effects and Wellbutrin did not helped.Vyvanse expensive.   Recent Results (from the past 2160 hour(s))  Lipase, blood     Status: None   Collection Time: 08/07/22  2:02 AM  Result Value Ref Range   Lipase 32 11 - 51 U/L    Comment: Performed at [facility], [address] [place], [place] [address]  Comprehensive metabolic panel     Status: Abnormal   Collection Time: 08/07/22  2:02 AM  Result Value Ref Range   Sodium 136 135 - 145 mmol/L   Potassium 3.3 (L) 3.5 - 5.1 mmol/L   Chloride 105 98 - 111 mmol/L   CO2 18 (L) 22 - 32 mmol/L   Glucose, Bld 132 (H) 70 - 99 mg/dL    Comment: Glucose reference range applies only to samples taken after fasting for at least 8 hours.   BUN 13 6 - 20 mg/dL   Creatinine, Ser 1.08 (H) 0.44 - 1.00 mg/dL   Calcium 9.1 8.9 - 10.3 mg/dL   Total Protein 7.6 6.5 - 8.1 g/dL   Albumin 3.9 3.5 - 5.0 g/dL   AST 42 (H) 15 - 41 U/L   ALT 39 0 -  44 U/L   Alkaline Phosphatase 162 (H) 38 - 126 U/L   Total Bilirubin 0.3 0.3 - 1.2 mg/dL   GFR, Estimated >60 >60 mL/min    Comment: (NOTE) Calculated using the CKD-EPI Creatinine Equation (2021)    Anion gap 13 5 - 15    Comment: Performed at [facility] [address] [place], [place] [address]  CBC     Status: Abnormal   Collection Time: 08/07/22  2:02 AM  Result Value Ref Range   WBC 11.7 (H) 4.0 - 10.5 K/uL   RBC 4.76 3.87 - 5.11 MIL/uL   Hemoglobin 11.4 (L) 12.0 - 15.0 g/dL   HCT 36.7 36.0 - 46.0 %   MCV 77.1 (L) 80.0 - 100.0 fL   MCH 23.9 (L) 26.0 - 34.0 pg   MCHC 31.1 30.0 - 36.0 g/dL   RDW 14.7 11.5 - 15.5 %   Platelets 423 (H) 150 - 400 K/uL   nRBC 0.0 0.0 - 0.2 %    Comment: Performed at [facility], [place] [address], [place], [place]  [address]  I-Stat beta hCG blood, ED ([facility], [facility], [facility] only)     Status: None   Collection Time: 08/07/22  2:33 AM  Result Value Ref Range   I-stat hCG, quantitative <5.0 <5 mIU/mL   Comment 3            Comment:   GEST. AGE      CONC.  (mIU/mL)   <=1 WEEK        5 - 50     2 WEEKS       50 - 500     3 WEEKS       100 - 10,000     4 WEEKS     1,000 - 30,000        FEMALE AND NON-PREGNANT FEMALE:     LESS THAN 5 mIU/mL   I-Stat beta hCG blood, ED     Status: None   Collection Time: 08/07/22  2:46 AM  Result Value Ref Range   I-stat hCG, quantitative <5.0 <5 mIU/mL   Comment 3            Comment:   GEST. AGE      CONC.  (mIU/mL)   <=1 WEEK        5 - 50     2 WEEKS       50 - 500     3 WEEKS       100 - 10,000     4 WEEKS     1,000 - 30,000        FEMALE AND NON-PREGNANT FEMALE:     LESS THAN 5 mIU/mL   Urinalysis, Routine w reflex microscopic -Urine, Clean Catch     Status: Abnormal   Collection Time: 08/07/22 10:46 AM  Result Value Ref Range   Color, Urine YELLOW YELLOW   APPearance CLEAR CLEAR   Specific Gravity, Urine >1.046 (H) 1.005 - 1.030   pH 5.0 5.0 - 8.0   Glucose, UA NEGATIVE NEGATIVE mg/dL   Hgb urine dipstick NEGATIVE NEGATIVE   Bilirubin Urine NEGATIVE NEGATIVE   Ketones, ur NEGATIVE NEGATIVE mg/dL   Protein, ur NEGATIVE NEGATIVE mg/dL   Nitrite NEGATIVE NEGATIVE   Leukocytes,Ua TRACE (A) NEGATIVE   RBC / HPF 0-5 0 - 5 RBC/hpf   WBC, UA 6-10 0 - 5 WBC/hpf   Bacteria, UA RARE (A) NONE SEEN   Squamous  Epithelial / HPF 0-5 0 - 5 /HPF   Mucus PRESENT     Comment: Performed at [facility], [place] [address] [place],  [address]     Psychiatric Specialty Exam: Physical Exam  Review of Systems  Gastrointestinal:  Positive for nausea.    Weight 229 lb (103.9 kg), last menstrual period 07/31/2022.There is no height or weight on file to calculate BMI.  General Appearance: NA  Eye Contact:  NA  Speech:  Clear and Coherent  Volume:  Normal  Mood:  Euthymic   Affect:  NA  Thought Process:  Goal Directed  Orientation:  Full (Time, Place, and Person)  Thought Content:  Logical  Suicidal Thoughts:  No  Homicidal Thoughts:  No  Memory:  Immediate;   Good Recent;   Good Remote;   Good  Judgement:  Intact  Insight:  Present  Psychomotor Activity:  NA  Concentration:  Concentration: Good and Attention Span: Good  Recall:  Good  Fund of Knowledge:  Good  Language:  Good  Akathisia:  No  Handed:  Right  AIMS (if indicated):     Assets:  Communication Skills Desire for Improvement Housing Social Support Talents/Skills Transportation  ADL's:  Intact  Cognition:  WNL  Sleep:   ok      Assessment and Plan: 1. Attention deficit hyperactivity disorder (ADHD), predominantly inattentive type Continue Ritalin 10 mg in the morning and 5 mg at noontime.  2. MDD (major depressive disorder), recurrent episode, mild (HCC)  - OLANZapine-FLUoxetine (SYMBYAX) 6-50 MG capsule; Take 1 capsule by mouth daily.  Dispense: 90 capsule; Refill: 0 - vortioxetine HBr (TRINTELLIX) 10 MG TABS tablet; Take 1 tablet (10 mg total) by mouth daily.  Dispense: 90 tablet; Refill: 0  3. Obsessive-compulsive disorder, unspecified type Olanzapine-FLUoxetine (SYMBYAX) 6-50 MG capsule; Take 1 capsule by mouth daily.  Dispense: 90 capsule; Refill: 0  4. Panic attack She will require Xanax when she needed at this time she has enough refills  I reviewed blood work results, her potassium is low but she is hydrating better.  Discussed current medication.  She wants to keep the current dose since it is helping her obsessive thoughts, anxiety, depression.  She has enough Ritalin and Xanax for now and she will ask herself if she need refills.  Recommended to call us back if there is any question or any concern.  Follow-up in 3 months.    Follow Up Instructions:    I discussed the assessment and treatment plan with the patient. The patient was provided an opportunity to ask  questions and all were answered. The patient agreed with the plan and demonstrated an understanding of the instructions.   The patient was advised to call back or seek an in-person evaluation if the symptoms worsen or if the condition fails to improve as anticipated.  Collaboration of Care: Other provider involved in patient's care AEB notes are available in epic to review.  Patient/Guardian was advised Release of Information must be obtained prior to any record release in order to collaborate their care with an outside provider. Patient/Guardian was advised if they have not already done so to contact the registration department to sign all necessary forms in order for [place] to release information regarding their care.   Consent: Patient/Guardian gives verbal consent for treatment and assignment of benefits for services provided during this visit. Patient/Guardian expressed understanding and agreed to proceed.    I provided 20 minutes of non-face-to-face time during this encounter.  Kathlee Nations, MD

## 2022-08-15 ENCOUNTER — Ambulatory Visit: Payer: Self-pay | Admitting: Nurse Practitioner

## 2022-08-18 ENCOUNTER — Telehealth (HOSPITAL_COMMUNITY): Payer: Self-pay | Admitting: *Deleted

## 2022-08-18 NOTE — Telephone Encounter (Signed)
Pt called to inform that she had been on a trial of Phentermine and stopped taking the Ritalin 10 mg (total dose 15 mg). However pt had a "bad reaction" to the phentermine medication was discontinued yesterday and would like to restart the Ritalin. Pt has the medication but wants to know when she can restart Ritalin. Last prescribed on 07/28/22. Pt next appointment scheduled for 11/07/22. Please review and advise.

## 2022-08-18 NOTE — Telephone Encounter (Signed)
Not sure about bad reaction.  I would suggest of symptoms subsided.  She can restart Ritalin.

## 2022-08-24 NOTE — Telephone Encounter (Signed)
@   VM's left for pt. Pt has not returned calls.

## 2022-09-05 ENCOUNTER — Telehealth (HOSPITAL_COMMUNITY): Payer: Self-pay | Admitting: *Deleted

## 2022-09-05 DIAGNOSIS — F900 Attention-deficit hyperactivity disorder, predominantly inattentive type: Secondary | ICD-10-CM

## 2022-09-05 MED ORDER — METHYLPHENIDATE HCL 10 MG PO TABS: 15.0000 mg | ORAL_TABLET | Freq: Every day | ORAL | 0 refills | Status: DC

## 2022-09-05 NOTE — Telephone Encounter (Signed)
Pt called requesting a refill of Methylphenidate 10 mg tabs 1.5 tab QD. Last e-scribed on 07/28/22 on last visit. Pt f/u scheduled for 11/07/22. Please review.

## 2022-09-05 NOTE — Telephone Encounter (Signed)
[facility] 

## 2022-09-29 ENCOUNTER — Telehealth (HOSPITAL_COMMUNITY): Payer: Self-pay | Admitting: *Deleted

## 2022-09-29 DIAGNOSIS — F410 Panic disorder [episodic paroxysmal anxiety] without agoraphobia: Secondary | ICD-10-CM

## 2022-09-29 NOTE — Telephone Encounter (Signed)
Pt called requesting a refill of the Alprazolam 0.5 mg tabs. Last script sent on 05/09/22 for #7. Pt last visit was on 08/08/22 and she has f/u scheduled now for 11/17/22 in office visit. Please review and advise.

## 2022-09-30 MED ORDER — ALPRAZOLAM 0.5 MG PO TABS: ORAL_TABLET | ORAL | 0 refills | Status: DC

## 2022-09-30 NOTE — Telephone Encounter (Signed)
[facility] Please inform patient.

## 2022-10-10 ENCOUNTER — Telehealth (HOSPITAL_COMMUNITY): Payer: Self-pay | Admitting: *Deleted

## 2022-10-10 DIAGNOSIS — F900 Attention-deficit hyperactivity disorder, predominantly inattentive type: Secondary | ICD-10-CM

## 2022-10-10 MED ORDER — METHYLPHENIDATE HCL 10 MG PO TABS: 15.0000 mg | ORAL_TABLET | Freq: Every day | ORAL | 0 refills | Status: DC

## 2022-10-10 NOTE — Telephone Encounter (Signed)
Pt called requesting refills of both Ritalin 10 mg 1.5 tabs QD, and xanax 0.5 mg 1 tab PRN for panic attacks. Ritalin 10 mg #45 last e-scribed on 08/07/22 and Xanax 0.5  mg #7 on 09/30/22. Last seen on 08/08/22 and has f/u scheduled for 11/17/22 in office. Please review.

## 2022-10-10 NOTE — Telephone Encounter (Signed)
I just send stimulant.  I gave her Xanax No. 7 tablets which usually last for 2 to 3 months.  If she is using more Xanax then we may need to cut down the stimulant because that may be contributing to her anxiety.  No Xanax given on this telephone encounter.

## 2022-10-11 MED ORDER — METHYLPHENIDATE HCL 10 MG PO TABS: 15.0000 mg | ORAL_TABLET | Freq: Every day | ORAL | 0 refills | Status: DC

## 2022-10-11 NOTE — Telephone Encounter (Signed)
Done

## 2022-10-11 NOTE — Addendum Note (Signed)
Addended by: Kathryne Sharper T on: 10/11/2022 04:33 PM   Modules accepted: Orders

## 2022-10-11 NOTE — Telephone Encounter (Signed)
Pt called today requesting that the Methylphenidate 10 mg 1.5 tabs QD [facility] [address] [facility]. Writer advised that Xanax will not be refilled at this time.

## 2022-10-19 NOTE — Telephone Encounter (Signed)
error 

## 2022-11-07 ENCOUNTER — Telehealth (HOSPITAL_COMMUNITY): Payer: 59 | Admitting: Psychiatry

## 2022-11-09 ENCOUNTER — Telehealth (HOSPITAL_COMMUNITY): Payer: Self-pay | Admitting: *Deleted

## 2022-11-09 DIAGNOSIS — F900 Attention-deficit hyperactivity disorder, predominantly inattentive type: Secondary | ICD-10-CM

## 2022-11-09 MED ORDER — METHYLPHENIDATE HCL 10 MG PO TABS: 15.0000 mg | ORAL_TABLET | Freq: Every day | ORAL | 0 refills | Status: DC

## 2022-11-09 NOTE — Telephone Encounter (Signed)
Done

## 2022-11-09 NOTE — Telephone Encounter (Signed)
Pt called requesting a refill of the generic Ritalin 10 mg 1.5 tablet QD. Rx last e-scribed on 10/11/22. Pt has an appointment scheduled for f/u on 11/17/22. Please review and advise.

## 2022-11-17 ENCOUNTER — Encounter (HOSPITAL_COMMUNITY): Payer: Self-pay | Admitting: Psychiatry

## 2022-11-17 ENCOUNTER — Ambulatory Visit (HOSPITAL_BASED_OUTPATIENT_CLINIC_OR_DEPARTMENT_OTHER): Payer: 59 | Admitting: Psychiatry

## 2022-11-17 DIAGNOSIS — F900 Attention-deficit hyperactivity disorder, predominantly inattentive type: Secondary | ICD-10-CM

## 2022-11-17 DIAGNOSIS — F410 Panic disorder [episodic paroxysmal anxiety] without agoraphobia: Secondary | ICD-10-CM

## 2022-11-17 DIAGNOSIS — F330 Major depressive disorder, recurrent, mild: Secondary | ICD-10-CM

## 2022-11-17 DIAGNOSIS — F429 Obsessive-compulsive disorder, unspecified: Secondary | ICD-10-CM | POA: Diagnosis not present

## 2022-11-17 MED ORDER — OLANZAPINE-FLUOXETINE HCL 6-50 MG PO CAPS: 1.0000 | ORAL_CAPSULE | Freq: Every day | ORAL | 0 refills | Status: DC

## 2022-11-17 MED ORDER — ALPRAZOLAM 0.5 MG PO TABS: ORAL_TABLET | ORAL | 0 refills | Status: DC

## 2022-11-17 MED ORDER — VORTIOXETINE HBR 10 MG PO TABS: 10.0000 mg | ORAL_TABLET | Freq: Every day | ORAL | 0 refills | Status: DC

## 2022-11-17 NOTE — Progress Notes (Signed)
BH MD/PA/NP OP Progress Note  11/17/2022 4:20 PM Diana Page  MRN:  [id number]  Chief Complaint:  Chief Complaint  Patient presents with   Follow-up   HPI: Patient came in for her follow-up appointment with her husband.  She has question about pregnancy, anxiety and medication.  Patient told they have decided not to go through IVF because of expense and trying to get pregnant naturally.  Options are medications and patient closely working with OB/GYN Dr. Noland Fordyce.  She admitted anxious and nervous because to many things going on.  She has to do invasive testing to see if she can be a candidate for pregnancy.  She also have an appointment with a dentist after 13 years and she is nervous about it.  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  She is taking summer school and hoping to graduate and fall.  Patient reported a lot of things going with her and she is not sure how to handle.  She is having some anxiety and nervousness and taking the Xanax more frequently.  She had cut down her Ritalin and only taking 10 mg that helps some of her anxiety but she cannot cut down further because she is not able to focus and not able to productive.  She is concerned about her grades and her school.  She like to know what are the options if she decided to get pregnant.  She feels the current medicine is working and her OCD, depression is a stable.  She is sleeping good.  She denies any mania, anger, crying spells or any suicidal thoughts.  She sleeps good.  She had a very good support from her husband.  She also seeing her therapist on a regular basis.  Her energy level is okay.  She like to start weight loss program before she get pregnant.  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  She is taking Symbyax, Ritalin, Trintellix and occasionally Xanax.  She denies drinking or using any illegal substances.  She has fewer panic attacks recently when she  is thinking about her stressors.  Visit Diagnosis:    ICD-10-CM   1. Panic attack  F41.0 ALPRAZolam (XANAX) 0.5 MG tablet    2. MDD (major depressive disorder), recurrent episode, mild (HCC)  F33.0 OLANZapine-FLUoxetine (SYMBYAX) 6-50 MG capsule    vortioxetine HBr (TRINTELLIX) 10 MG TABS tablet    3. Obsessive-compulsive disorder, unspecified type  F42.9 OLANZapine-FLUoxetine (SYMBYAX) 6-50 MG capsule    4. Attention deficit hyperactivity disorder (ADHD), predominantly inattentive type  F90.0       Past Psychiatric History:  H/O depression and OCD.  Tried Prozac in [place].  No h/o hallucination, paranoia, suicidal attempt or inpatient treatment.  Symbyax worked. Tried Xanax as needed. Luvox caused sexual side effects and Wellbutrin did not helped.Vyvanse expensive.    Past Medical History:  Past Medical History:  Diagnosis Date   Anemia    Anxiety    Back pain    Depression    Fatty liver    GERD (gastroesophageal reflux disease)    Headache 01/04/2015   Hx of cholecystectomy    Hypertension    Intercranial Hypertension   IBS (irritable bowel syndrome)    Joint pain    Obsessive compulsive disorder 11/2012   Ostomy nurse consultation    Pancreatitis    hx of    UC (ulcerative colitis) (HCC)  Past Surgical History:  Procedure Laterality Date   CHOLECYSTECTOMY     LAPAROSCOPIC PARTIAL PROTECTOMY     PERMANENT ILEOSTOMY     TOTAL COLECTOMY N/A     Family Psychiatric History: Reviewed.  Family History:  Family History  Problem Relation Age of Onset   OCD Mother    Diabetes Mother    Depression Mother    Anxiety disorder Mother    Obesity Mother    Obesity Father    Crohn's disease Father    OCD Sister    Depression Sister    Ulcerative colitis Sister    OCD Brother    Anxiety disorder Brother    Diverticulitis Brother     Social History:  Social History   Socioeconomic History   Marital status: Married    Spouse name: Caryn Bee   Number of  children: 0   Years of education: Ba   Highest education level: Not on file  Occupational History   Occupation: Merchandiser, retail     Comment: Hotel manager  Tobacco Use   Smoking status: Former    Types: Cigarettes, E-cigarettes    Quit date: 06/27/2009    Years since quitting: 13.4   Smokeless tobacco: Never  Vaping Use   Vaping Use: Former  Substance and Sexual Activity   Alcohol use: Never    Comment: occasionally, "maybe once or twice a year"   Drug use: No   Sexual activity: Not Currently    Partners: Male    Birth control/protection: Condom  Other Topics Concern   Not on file  Social History Narrative   Lives at home with husband, Caryn Bee.   Caffeine use: maybe 5 big cups daily   Right handed   Social Determinants of Health   Financial Resource Strain: Not on file  Food Insecurity: Not on file  Transportation Needs: Not on file  Physical Activity: Not on file  Stress: Not on file  Social Connections: Not on file    Allergies:  Allergies  Allergen Reactions   Nsaids Other (See Comments)    Cannot have because of her Ulcerative Colitis   Tolmetin Other (See Comments)    Cannot have because of her Ulcerative Colitis    Metabolic Disorder Labs: Lab Results  Component Value Date   HGBA1C 5.1 09/07/2016   No results found for: "PROLACTIN" Lab Results  Component Value Date   CHOL 171 07/01/2021   TRIG 193 (H) 07/01/2021   HDL 32 (L) 07/01/2021   CHOLHDL 5.3 07/01/2021   VLDL 39 07/01/2021   LDLCALC 100 (H) 07/01/2021   LDLCALC 94 09/07/2016   Lab Results  Component Value Date   TSH 1.914 07/01/2018   TSH 1.740 09/07/2016    Therapeutic Level Labs: No results found for: "LITHIUM" No results found for: "VALPROATE" No results found for: "CBMZ"  Current Medications: Current Outpatient Medications  Medication Sig Dispense Refill   acetaZOLAMIDE ER (DIAMOX) 500 MG capsule Take 2 capsules (1,000 mg total) by mouth at bedtime. 180 capsule 3   ALPRAZolam  (XANAX) 0.5 MG tablet Take one tab as needed for severe panic attacks. 7 tablet 0   HYDROcodone-acetaminophen (NORCO/VICODIN) 5-325 MG tablet Take 1 tablet by mouth every 4 (four) hours as needed. 10 tablet 0   Incontinence Supplies (DRAINABLE FECAL COLLECTOR) MISC by Does not apply route.     methylphenidate (RITALIN) 10 MG tablet Take 1.5 tablets (15 mg total) by mouth daily. 45 tablet 0   OLANZapine-FLUoxetine (SYMBYAX) 6-50 MG capsule  Take 1 capsule by mouth daily. 90 capsule 0   Ostomy Supplies MISC by Does not apply route.     pantoprazole (PROTONIX) 40 MG tablet Take 40 mg by mouth daily.     rizatriptan (MAXALT-MLT) 10 MG disintegrating tablet Take 1 tablet (10 mg total) by mouth as needed for migraine. May repeat in 2 hours if needed. Max 2 tablets in 24 hours. 9 tablet 1   vortioxetine HBr (TRINTELLIX) 10 MG TABS tablet Take 1 tablet (10 mg total) by mouth daily. 90 tablet 0   No current facility-administered medications for this visit.     Musculoskeletal: Strength & Muscle Tone: within normal limits Gait & Station: normal Patient leans: N/A  Psychiatric Specialty Exam: Review of Systems  Gastrointestinal:        Has colostomy bag    Blood pressure 114/76, height 5\' 2"  (1.575 m), weight 237 lb (107.5 kg).There is no height or weight on file to calculate BMI.  General Appearance: Casual  Eye Contact:  Good  Speech:  Normal Rate  Volume:  Normal  Mood:  Anxious  Affect:  Congruent  Thought Process:  Goal Directed  Orientation:  Full (Time, Place, and Person)  Thought Content: Rumination   Suicidal Thoughts:  No  Homicidal Thoughts:  No  Memory:  Immediate;   Good Recent;   Good Remote;   Good  Judgement:  Good  Insight:  Good  Psychomotor Activity:  Normal  Concentration:  Concentration: Good and Attention Span: Good  Recall:  Good  Fund of Knowledge: Good  Language: Good  Akathisia:  No  Handed:  Right  AIMS (if indicated): not done  Assets:  Communication  Skills Desire for Improvement Housing Resilience Social Support Talents/Skills Transportation  ADL's:  Intact  Cognition: WNL  Sleep:  Good   Screenings: PHQ2-9    Flowsheet Row Office Visit from 03/24/2021 in [facility] Video Visit from 09/28/2020 in [facility] Nutrition from 06/04/2018 in [facility] from 09/07/2016 in [facility] Nutrition from 04/08/2016 in [facility] at [facility] Total Score 2 2 4 4 1   PHQ-9 Total Score 12 4 15 15  --      Flowsheet Row ED from 08/07/2022 in [facility] Emergency Department at [facility] ED from 07/24/2021 in [facility] Emergency Department at [facility] ED to Hosp-Admission (Discharged) from 06/28/2021 in [facility]  C-SSRS RISK CATEGORY No Risk No Risk No Risk        Assessment and Plan: Patient came in with her husband to discuss multiple things.  I had a long discussion with the patient about medication and pregnancy.  We explain that safer choice is not to take any medication especially in the first trimester.  I discussed any stimulants or benzodiazepine may not be suitable for her pregnancy.  She is willing to work on it and she will try to cut down the Ritalin to only take on the weekdays until she is able to come off slowly and gradually in next few months.  I recommend should discussed with her OB/GYN in detail if hydroxyzine, BuSpar or gabapentin may be other choices she can take in pregnancy.  I explained even though she is taking Xanax every few weeks but if needed in the future then we should have a  safer choice.  I also encourage discussed with her therapist in detail if she need to have more frequent visits to help anxiety.  Patient has initial visit with her  OB/GYN and patient is comfortable after talking to them that she may be continue Symbyax and Trintellix.  We discussed possible interaction, teratogenic effects and safer choices during the pregnancy.  Patient had a good support from her husband.  Patient family lives in [place] which she can talk to them on a regular basis if needed.  I have given our contact information to her if her OB/GYN wants to collaborate with [place].  Patient agreed with the plan.  She will discuss alternative choices with her OB/GYN and let us know soon.  In the meantime she will cut down her Ritalin 10 mg and she will take only those days when she needed.  She will also cut down her Xanax but keep the Trintellix and Symbyax unchanged.  She will also start the weight loss program.  Encourage walking, watching her calorie intake.  Recommend to call us back if she has any question or any concern.  We will follow-up in 3 months.  Collaboration of Care: Collaboration of Care: Other provider involved in patient's care AEB notes are available in epic to review.  Patient/Guardian was advised Release of Information must be obtained prior to any record release in order to collaborate their care with an outside provider. Patient/Guardian was advised if they have not already done so to contact the registration department to sign all necessary forms in order for [place] to release information regarding their care.   Consent: Patient/Guardian gives verbal consent for treatment and assignment of benefits for services provided during this visit. Patient/Guardian expressed understanding and agreed to proceed.    Cleotis Nipper, MD 11/17/2022, 4:20 PM

## 2022-11-22 ENCOUNTER — Telehealth: Payer: Self-pay

## 2022-12-14 ENCOUNTER — Ambulatory Visit: Payer: Managed Care, Other (non HMO) | Admitting: Neurology

## 2022-12-30 ENCOUNTER — Telehealth (HOSPITAL_COMMUNITY): Payer: Self-pay

## 2022-12-30 DIAGNOSIS — F900 Attention-deficit hyperactivity disorder, predominantly inattentive type: Secondary | ICD-10-CM

## 2022-12-30 NOTE — Telephone Encounter (Signed)
Patient is calling for a refill on her Ritalin last filled in May. Please review and advise, thank you

## 2023-01-02 MED ORDER — METHYLPHENIDATE HCL 10 MG PO TABS: 15.0000 mg | ORAL_TABLET | Freq: Every day | ORAL | 0 refills | Status: DC

## 2023-01-02 NOTE — Telephone Encounter (Signed)
Done

## 2023-01-31 ENCOUNTER — Ambulatory Visit: Payer: Managed Care, Other (non HMO) | Attending: Obstetrics | Admitting: Maternal & Fetal Medicine

## 2023-01-31 ENCOUNTER — Ambulatory Visit: Payer: Managed Care, Other (non HMO) | Admitting: *Deleted

## 2023-01-31 ENCOUNTER — Other Ambulatory Visit: Payer: Self-pay | Admitting: Neurology

## 2023-01-31 DIAGNOSIS — K5100 Ulcerative (chronic) pancolitis without complications: Secondary | ICD-10-CM | POA: Diagnosis not present

## 2023-01-31 DIAGNOSIS — F419 Anxiety disorder, unspecified: Secondary | ICD-10-CM

## 2023-01-31 DIAGNOSIS — I159 Secondary hypertension, unspecified: Secondary | ICD-10-CM

## 2023-01-31 DIAGNOSIS — F428 Other obsessive-compulsive disorder: Secondary | ICD-10-CM | POA: Diagnosis not present

## 2023-01-31 DIAGNOSIS — Z9049 Acquired absence of other specified parts of digestive tract: Secondary | ICD-10-CM | POA: Diagnosis not present

## 2023-01-31 DIAGNOSIS — F32A Depression, unspecified: Secondary | ICD-10-CM

## 2023-01-31 NOTE — Progress Notes (Signed)
Patient information  Patient Name: Diana Page  Patient MRN:   [id number]  Referring practice: MFM Referring Provider: Rocco Pauls  MFM CONSULT  Diana Page is a 32 y.o. G0P0000 LMP sometime this month (pt unsure)   Diana Page is here for preconception consultation due to a history of anxiety, depression, OCD on multiple medications as well as a history of ulcerative colitis and idiopathic intracranial hypertension.  RE infertility: The patient reports that she has been trying to achieve pregnancy through unprotected intercourse for over 3 years.  She has had a workup with Dr. April Manson but was not disclosed of these results.  She reports her husband also has a semen analysis but did not receive the results.  She has a HSG scheduled with her gynecologist to see if there is a component of tubal factor.  I discussed that tubal factor infertility is common in up to 25% of patients with ulcerative colitis who have undergone extensive pelvic surgery.  I discussed that the typical treatment for tubal factor infertility involves removing the tubes and undergoing IVF for choosing surrogacy or adoption.  RE medication reivew: Diana Page takes multiple medications to achieve mental health due to OCD, anxiety and depression. -Methylphenidate (Ritalin): No evidence to suggest increased risk of congenital malformations based on limited human and animal studies. Maicee reports that she thinks that she can do well without this medication and would like to discontinue it prior to attempting pregnancy. -Trintellix: While there is no evidence to suggest an increased risk into general malformations based on limited human and animal studies, the patient would like to discontinue this medicine because it does not seem to be greatly beneficial. -Olanzapine, fluoxetine: Symbyax has been very helpful to Diana Page for her mental health and wellbeing.  There are no known associations with congenital anomalies for other  medication.  Since the benefit is likely greater than any potential risk it is recommended she continue these during the preconception period and during her pregnancy. -Acetazolamide: No evidence to suggest increased risk of congenital malformations based on limited human and animal studies. First line treatment for IHI in pregnancy.  -Xanax: This is not too thought to increase the risk of congenital anomalies but there can be withdrawal in the neonate when high doses are given near the time of birth.  Small doses of as needed medications are likely low risk during pregnancy.  RE ulcerative colitis: The patient has a history of inflammatory bowel disease that was diagnosed in 2012.  She was initially well-controlled with 6-MP but ended up developing liver and pancreatic complications.  She had recurrent bouts of pancreatitis while on this medication.  She had a cholecystectomy due to recurrent pancreatitis but during her workup it was also found that she had a constricted bile duct.  She had a bile duct stent placed twice and since that time she has done well.  She tried Remicade, Entyvio and Humira to control her ulcerative colitis but it was not successful.  She was on high doses of prednisone for a year.  She was told she was not a candidate for IPAA.  As a result she underwent a total colectomy, proctectomy and appendectomy by Dr. Elenore Rota in 2016 at [facility].  Since that time she has been disease-free.  I discussed that typically pregnancies with inflammatory bowel disease do well if they are in disease remission.  I do not expect any increase in complications since she has been disease-free since 2016  RE intracranial hypertension: Pregnancy is complicated  by IIH are typically well-tolerated during pregnancy.  However, there is an increased risk for worsening symptoms including headache and visual impairment.  Acetazolamide is the first-line therapy and can be continued safely during pregnancy.  Repeat lumbar  puncture also can be used as a temporizing measure in patients with progressive visual loss despite medical therapy until a permanent shunt can be placed after delivery.  Steroids and furosemide can also be used during pregnancy as needed.  She can continue to follow with [facility] neurology throughout her pregnancy.  Assessment Ulcerative pancolitis without complication (HCC)  History of total colectomy  Other obsessive-compulsive disorders  Anxiety and depression  Plan -I encouraged follow-up with [facility] to see the results of her and her husband's infertility workup -Agree with HSG to assess tubal patency given the history of extensive abdominal surgery -She can continue all medications until she has a plan for achieving pregnancy.  Once she has a plan she can wean off of Ritalin and Trintellix as directed by her psychiatrist.  While these medications are likely low risk overall, they do not seem to have as much benefit as her other medications and she would feel most comfortable discontinuing these medications to decrease any risks to the fetus. -Continue olanzapine, fluoxetine and as needed Xanax. -I reminded the patient that any discontinuation of mental health medication should be done under the guidance of her psychiatrist to avoid precipitating withdrawal.  Review of Systems: A review of systems was performed and was negative except per HPI   Vitals and Physical Exam    11/17/2022    4:51 PM 08/08/2022    1:15 PM 08/07/2022   12:00 PM  Vitals with BMI  Height     Weight     BMI     Systolic   119  Diastolic   76  Pulse   92     Information is confidential and restricted. Go to Review Flowsheets to unlock data.  Sitting comfortably in the office chair Nonlabored breathing Normal mood and affected  Past pregnancies OB History  Gravida Para Term Preterm AB Living  0 0 0 0 0 0  SAB IAB Ectopic Multiple Live Births  0 0 0 0 0    I spent 60 minutes reviewing the patients  chart, including labs and images as well as counseling the patient about her medical conditions. Greater than 50% of the time was spent in direct face-to-face patient counseling.  Braxton Feathers  MFM, [facility]   01/31/2023  9:41 AM

## 2023-01-31 NOTE — Progress Notes (Signed)
Preconception consult Bp:124/76        pulse:  79

## 2023-02-02 ENCOUNTER — Other Ambulatory Visit: Payer: Self-pay | Admitting: Neurology

## 2023-02-02 NOTE — Telephone Encounter (Signed)
Pt called wanting to know when this rx will be called in for her. Please advise.

## 2023-02-03 ENCOUNTER — Other Ambulatory Visit: Payer: Self-pay | Admitting: Neurology

## 2023-02-13 ENCOUNTER — Telehealth (HOSPITAL_COMMUNITY): Payer: 59 | Admitting: Psychiatry

## 2023-02-22 ENCOUNTER — Telehealth (HOSPITAL_COMMUNITY): Payer: 59 | Admitting: Psychiatry

## 2023-02-22 ENCOUNTER — Encounter (HOSPITAL_COMMUNITY): Payer: Self-pay | Admitting: Psychiatry

## 2023-02-22 VITALS — Wt 237.0 lb

## 2023-02-22 DIAGNOSIS — F900 Attention-deficit hyperactivity disorder, predominantly inattentive type: Secondary | ICD-10-CM

## 2023-02-22 DIAGNOSIS — F429 Obsessive-compulsive disorder, unspecified: Secondary | ICD-10-CM | POA: Diagnosis not present

## 2023-02-22 DIAGNOSIS — F330 Major depressive disorder, recurrent, mild: Secondary | ICD-10-CM | POA: Diagnosis not present

## 2023-02-22 DIAGNOSIS — F410 Panic disorder [episodic paroxysmal anxiety] without agoraphobia: Secondary | ICD-10-CM

## 2023-02-22 MED ORDER — METHYLPHENIDATE HCL 10 MG PO TABS: 10.0000 mg | ORAL_TABLET | Freq: Every day | ORAL | 0 refills | Status: DC

## 2023-02-22 MED ORDER — VORTIOXETINE HBR 10 MG PO TABS: 10.0000 mg | ORAL_TABLET | Freq: Every day | ORAL | 0 refills | Status: DC

## 2023-02-22 MED ORDER — OLANZAPINE-FLUOXETINE HCL 6-50 MG PO CAPS: 1.0000 | ORAL_CAPSULE | Freq: Every day | ORAL | 0 refills | Status: DC

## 2023-02-22 NOTE — Progress Notes (Signed)
[facility] MD Virtual Progress Note   Patient Location: Home Provider Location: Home Office  I connect with patient by video and verified that I am speaking with correct person by using two identifiers. I discussed the limitations of evaluation and management by telemedicine and the availability of in person appointments. I also discussed with the patient that there may be a patient responsible charge related to this service. The patient expressed understanding and agreed to proceed.  Diana Page [id number] 32 y.o.  02/22/2023 10:19 AM  History of Present Illness:  Patient is evaluated by video session.  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  She admitted some anxiety related to internship but overall she feel her mood is stable.  She is hoping to graduate in December and after that she is taking some time and consider pregnancy.  She had a visit with OB/GYN to discuss about her possibilities.  She also did discuss about her psychotropic medication.  She was told that she need to stop the Ritalin and Trintellix if she decided to pursue for pregnancy.  She was also told that she can take the Xanax since she is taking only as needed.  She was told Symbyax is okay in pregnancy.  She feel more relaxed.  She denies any crying spells or any feeling of hopelessness or worthlessness.  She has a good support from her husband.  She has no tremor or shakes or any EPS.  She cut down her Ritalin and only taking during the weekdays.  Her last Xanax was a month ago.  She reported her panic attacks are less frequent and less intense.  She does not need a new prescription of Xanax.  Since Ritalin cut down her sleep is better.  She reported her focus attention is good.  She really like Symbyax and Trintellix that is keeping her OCD, depression and anxiety is stable.  Her appetite is okay.  She is not sure but admitted may have  lost 3 pounds since watching her diet and calorie intake.  Past Psychiatric History: H/O depression and OCD.  Tried Prozac in [place].  No h/o hallucination, paranoia, suicidal attempt or inpatient treatment.  Symbyax worked. Tried Xanax as needed. Luvox caused sexual side effects and Wellbutrin did not helped.Vyvanse expensive.     Outpatient Encounter Medications as of 02/22/2023  Medication Sig   acetaZOLAMIDE ER (DIAMOX) 500 MG capsule TAKE 2 CAPSULES (1,000 MG TOTAL) BY MOUTH AT BEDTIME.   ALPRAZolam (XANAX) 0.5 MG tablet Take one tab as needed for severe panic attacks.   fexofenadine (ALLEGRA ODT) 30 MG disintegrating tablet Take 30 mg by mouth daily.   fluticasone (FLONASE) 50 MCG/ACT nasal spray Place 2 sprays into both nostrils daily.   Incontinence Supplies (DRAINABLE FECAL COLLECTOR) MISC by Does not apply route.   methylphenidate (RITALIN) 10 MG tablet Take 1.5 tablets (15 mg total) by mouth daily.   OLANZapine-FLUoxetine (SYMBYAX) 6-50 MG capsule Take 1 capsule by mouth daily.   ondansetron (ZOFRAN-ODT) 4 MG disintegrating tablet Take 4 mg by mouth every 8 (eight) hours as needed for nausea or vomiting.   Ostomy Supplies MISC by Does not apply route.   pantoprazole (PROTONIX) 40 MG tablet Take 40 mg by mouth daily.   vortioxetine HBr (TRINTELLIX) 10 MG TABS tablet Take 1 tablet (10 mg total) by mouth daily.   No facility-administered encounter medications on file as of  02/22/2023.    No results found for this or any previous visit (from the past 2160 hour(s)).   Psychiatric Specialty Exam: Physical Exam  Review of Systems  Weight 237 lb (107.5 kg).There is no height or weight on file to calculate BMI.  General Appearance: Casual  Eye Contact:  Good  Speech:  Clear and Coherent  Volume:  Normal  Mood:  Euthymic  Affect:  Appropriate  Thought Process:  Goal Directed  Orientation:  Full (Time, Place, and Person)  Thought Content:  Logical  Suicidal Thoughts:  No   Homicidal Thoughts:  No  Memory:  Immediate;   Good Recent;   Good Remote;   Good  Judgement:  Good  Insight:  Good  Psychomotor Activity:  Normal  Concentration:  Concentration: Good and Attention Span: Good  Recall:  Good  Fund of Knowledge:  Good  Language:  Good  Akathisia:  No  Handed:  Right  AIMS (if indicated):     Assets:  Communication Skills Desire for Improvement Housing Physical Health Resilience Social Support Talents/Skills Transportation  ADL's:  Intact  Cognition:  WNL  Sleep:  better     Assessment/Plan: MDD (major depressive disorder), recurrent episode, mild (HCC) - Plan: OLANZapine-FLUoxetine (SYMBYAX) 6-50 MG capsule, vortioxetine HBr (TRINTELLIX) 10 MG TABS tablet  Obsessive-compulsive disorder, unspecified type - Plan: OLANZapine-FLUoxetine (SYMBYAX) 6-50 MG capsule  Attention deficit hyperactivity disorder (ADHD), predominantly inattentive type - Plan: methylphenidate (RITALIN) 10 MG tablet  Panic attack  I reviewed notes from other provider.  Discussed about psychotropic medication if she decided to get pregnant but she is hoping after she finished her school and internship in December.  She understand that she need to stop the Ritalin and Trintellix.  She had cut down her Ritalin and only taking Xanax when she has severe panic attack.  For now she like to continue the medication and call us if she needed a new prescription of Xanax.  She has no tremors, shakes or any EPS.  She is in therapy every 2 weeks that has been very helpful.  She had a good support from her family who lives in [place].  We will continue Symbyax 6/50 mg daily, Trintellix 10 mg daily.  Ritalin 10 mg which she takes only during the weekdays.  She will call her Xanax if she needed.  Recommend to call back if there is any question or any concern.  We will follow-up in 3 months.   Follow Up Instructions:     I discussed the assessment and treatment plan with the patient. The  patient was provided an opportunity to ask questions and all were answered. The patient agreed with the plan and demonstrated an understanding of the instructions.   The patient was advised to call back or seek an in-person evaluation if the symptoms worsen or if the condition fails to improve as anticipated.    Collaboration of Care: Other provider involved in patient's care AEB notes are available in epic to review.  Patient/Guardian was advised Release of Information must be obtained prior to any record release in order to collaborate their care with an outside provider. Patient/Guardian was advised if they have not already done so to contact the registration department to sign all necessary forms in order for [place] to release information regarding their care.   Consent: Patient/Guardian gives verbal consent for treatment and assignment of benefits for services provided during this visit. Patient/Guardian expressed understanding and agreed to proceed.  I provided 21 minutes of non face to face time during this encounter.  Note: This document was prepared by [employer] voice dictation technology and any errors that results from this process are unintentional.    Cleotis Nipper, MD 02/22/2023

## 2023-04-14 ENCOUNTER — Telehealth (HOSPITAL_COMMUNITY): Payer: Self-pay | Admitting: *Deleted

## 2023-04-14 NOTE — Telephone Encounter (Signed)
Pt LVM requesting refills of both Ritalin 10 mg every day and Symbyax 6-50 mg every day. Ritalin last e-scribed 02/22/23. Symbyax #90 sent also on 02/22/23. Pt does not have f/u scheduled currently. Writer LVM for pt advising scheduling an appointment and will address control refill then.

## 2023-04-20 ENCOUNTER — Telehealth (HOSPITAL_COMMUNITY): Payer: Self-pay | Admitting: *Deleted

## 2023-04-20 DIAGNOSIS — F900 Attention-deficit hyperactivity disorder, predominantly inattentive type: Secondary | ICD-10-CM

## 2023-04-20 MED ORDER — METHYLPHENIDATE HCL 10 MG PO TABS: 10.0000 mg | ORAL_TABLET | Freq: Every day | ORAL | 0 refills | Status: DC

## 2023-04-20 NOTE — Telephone Encounter (Signed)
Pt called requesting a refill of Ritalin 10 mg every day. Last prescription sent on 8/28//24 in last visit. Pt now has an appointment scheduled for 05/08/23. Pt also requested Trintellix 10 mg refill. A 90 day supply was last sent on 02/22/23, so pt should have enough of this medication to get to appointment. Writer called pt back but no answer and no VM. Thank you.

## 2023-04-20 NOTE — Telephone Encounter (Signed)
Done

## 2023-05-01 ENCOUNTER — Other Ambulatory Visit: Payer: Self-pay | Admitting: Neurology

## 2023-05-02 ENCOUNTER — Encounter: Payer: Self-pay | Admitting: Nurse Practitioner

## 2023-05-02 ENCOUNTER — Ambulatory Visit (INDEPENDENT_AMBULATORY_CARE_PROVIDER_SITE_OTHER): Payer: Managed Care, Other (non HMO) | Admitting: Nurse Practitioner

## 2023-05-02 VITALS — BP 112/80 | HR 88 | Temp 98.6°F | Ht 62.4 in | Wt 232.0 lb

## 2023-05-02 DIAGNOSIS — L659 Nonscarring hair loss, unspecified: Secondary | ICD-10-CM | POA: Diagnosis not present

## 2023-05-02 DIAGNOSIS — R42 Dizziness and giddiness: Secondary | ICD-10-CM

## 2023-05-02 DIAGNOSIS — Z6841 Body Mass Index (BMI) 40.0 and over, adult: Secondary | ICD-10-CM | POA: Insufficient documentation

## 2023-05-02 DIAGNOSIS — Z1159 Encounter for screening for other viral diseases: Secondary | ICD-10-CM

## 2023-05-02 DIAGNOSIS — R631 Polydipsia: Secondary | ICD-10-CM | POA: Diagnosis not present

## 2023-05-02 DIAGNOSIS — F331 Major depressive disorder, recurrent, moderate: Secondary | ICD-10-CM | POA: Insufficient documentation

## 2023-05-02 DIAGNOSIS — Z833 Family history of diabetes mellitus: Secondary | ICD-10-CM

## 2023-05-02 DIAGNOSIS — E66813 Obesity, class 3: Secondary | ICD-10-CM

## 2023-05-02 DIAGNOSIS — R0602 Shortness of breath: Secondary | ICD-10-CM

## 2023-05-02 NOTE — Progress Notes (Signed)
Diana Page, CMA,acting as a Neurosurgeon for [employer], FNP.,have documented all relevant documentation on the behalf of Diana Felts, FNP,as directed by  Diana Felts, FNP while in the presence of Diana Felts, FNP.  Subjective:  Patient ID: Diana Page , female    DOB: September 13, 1990 , 32 y.o.   MRN: [id number]  Chief Complaint  Patient presents with   Dizziness    HPI  Patient reports she has been having been some dizziness and hair loss. She also feels like she is unable to take a deep breath and then she will try to crack her back and then she is able to take a deep breath. Patient is wanting to have some blood work done. When she changes her position she will have dizziness. She feels like she can inflate her lungs without cracking her cracking. The dizziness has been ongoing for at least a year. The back pain occurs in the last few months. She is losing her hair noticed in the last 3 months.   She has a history of ulcerative colitis being followed by Dr. Elnoria Page but has not seen him in about 1.5 years. She has been going to behavioral health. She has also been seen by Neurology for idiopathic hypertension - followed once a year, she feels this is a different type of dizziness. She is not drinking "much water".   Dizziness     Past Medical History:  Diagnosis Date   Anemia    Anxiety    Back pain    Depression    Fatty liver    GERD (gastroesophageal reflux disease)    Headache 01/04/2015   Hx of cholecystectomy    Hypertension    Intercranial Hypertension   IBS (irritable bowel syndrome)    Joint pain    Obsessive compulsive disorder 11/2012   Ostomy nurse consultation    Pancreatitis    hx of    UC (ulcerative colitis) (HCC)      Family History  Problem Relation Age of Onset   OCD Mother    Diabetes Mother    Depression Mother    Anxiety disorder Mother    Obesity Mother    Obesity Father    Crohn's disease Father    OCD Sister    Depression Sister     Ulcerative colitis Sister    OCD Brother    Anxiety disorder Brother    Diverticulitis Brother      Current Outpatient Medications:    acetaZOLAMIDE ER (DIAMOX) 500 MG capsule, TAKE 2 CAPSULES (1,000 MG TOTAL) BY MOUTH AT BEDTIME., Disp: 90 capsule, Rfl: 0   ALPRAZolam (XANAX) 0.5 MG tablet, Take one tab as needed for severe panic attacks., Disp: 10 tablet, Rfl: 0   fexofenadine (ALLEGRA ODT) 30 MG disintegrating tablet, Take 30 mg by mouth daily., Disp: , Rfl:    fluticasone (FLONASE) 50 MCG/ACT nasal spray, Place 2 sprays into both nostrils daily., Disp: , Rfl:    Incontinence Supplies (DRAINABLE FECAL COLLECTOR) MISC, by Does not apply route., Disp: , Rfl:    methylphenidate (RITALIN) 10 MG tablet, Take 1 tablet (10 mg total) by mouth daily., Disp: 30 tablet, Rfl: 0   OLANZapine-FLUoxetine (SYMBYAX) 6-50 MG capsule, Take 1 capsule by mouth daily., Disp: 90 capsule, Rfl: 0   ondansetron (ZOFRAN-ODT) 4 MG disintegrating tablet, Take 4 mg by mouth every 8 (eight) hours as needed for nausea or vomiting., Disp: , Rfl:    Ostomy Supplies MISC, by Does not apply route.,  Disp: , Rfl:    pantoprazole (PROTONIX) 40 MG tablet, Take 40 mg by mouth daily., Disp: , Rfl:    Vitamin D, Ergocalciferol, (DRISDOL) 1.25 MG (50000 UNIT) CAPS capsule, Take 1 capsule (50,000 Units total) by mouth every 7 (seven) days., Disp: 12 capsule, Rfl: 1   vortioxetine HBr (TRINTELLIX) 10 MG TABS tablet, Take 1 tablet (10 mg total) by mouth daily., Disp: 90 tablet, Rfl: 0   Allergies  Allergen Reactions   Nsaids Other (See Comments)    Cannot have because of her Ulcerative Colitis   Tolmetin Other (See Comments)    Cannot have because of her Ulcerative Colitis     Review of Systems  Constitutional: Negative.   Respiratory: Negative.    Cardiovascular: Negative.   Neurological:  Positive for dizziness.  Psychiatric/Behavioral: Negative.       Today's Vitals   05/02/23 1104  BP: 112/80  Pulse: 88  Temp: 98.6  F (37 C)  Weight: 232 lb (105.2 kg)  Height: 5' 2.4" (1.585 m)  PainSc: 0-No pain   Body mass index is 41.89 kg/m.  Wt Readings from Last 3 Encounters:  05/02/23 232 lb (105.2 kg)  08/07/22 229 lb 15 oz (104.3 kg)  12/09/21 227 lb 6.4 oz (103.1 kg)      Objective:  Physical Exam Vitals reviewed.  Constitutional:      General: She is not in acute distress.    Appearance: Normal appearance.  Cardiovascular:     Rate and Rhythm: Normal rate and regular rhythm.     Pulses: Normal pulses.     Heart sounds: Normal heart sounds. No murmur heard. Pulmonary:     Effort: Pulmonary effort is normal. No respiratory distress.     Breath sounds: Normal breath sounds. No wheezing.  Neurological:     General: No focal deficit present.     Mental Status: She is alert and oriented to person, place, and time.     Cranial Nerves: No cranial nerve deficit.     Motor: No weakness.  Psychiatric:        Mood and Affect: Mood normal.        Behavior: Behavior normal.        Thought Content: Thought content normal.        Judgment: Judgment normal.        05/02/2023   11:05 AM 03/24/2021    9:41 AM 09/28/2020   10:28 AM 06/04/2018    2:19 PM 09/07/2016    8:34 AM  Depression screen PHQ 2/9  Decreased Interest 1 1  2 1   Down, Depressed, Hopeless 1 1  2 3   PHQ - 2 Score 2 2  4 4   Altered sleeping 2 3  3 1   Tired, decreased energy 3 3  3 3   Change in appetite 1 0  2 3  Feeling bad or failure about yourself  0 1  3 2   Trouble concentrating 0 3  0 1  Moving slowly or fidgety/restless 0 0  0 1  Suicidal thoughts 0 0  0 0  PHQ-9 Score 8 12  15 15   Difficult doing work/chores Somewhat difficult Very difficult        Information is confidential and restricted. Go to Review Flowsheets to unlock data.       Assessment And Plan:  Dizziness Assessment & Plan: Slight positive orthos, advised to make sure she is staying well hydrated with water. Will also check her CBC  Orders: -  Hemoglobin A1c -     TSH -     VITAMIN D 25 Hydroxy (Vit-D Deficiency, Fractures) -     POCT urine pregnancy -     DG Chest 2 View; Future  Hair loss Assessment & Plan: Will check for metabolic causes.   Orders: -     TSH -     VITAMIN D 25 Hydroxy (Vit-D Deficiency, Fractures) -     Vitamin B12 -     Iron, TIBC and Ferritin Panel -     POCT urine pregnancy  Class 3 severe obesity due to excess calories with body mass index (BMI) of 40.0 to 44.9 in adult, unspecified whether serious comorbidity present [facility]) Assessment & Plan: She is encouraged to strive for BMI less than 30 to decrease cardiac risk. Advised to aim for at least 150 minutes of exercise per week.    Family history of diabetes mellitus  Polydipsia Assessment & Plan: Will check HgbA1c.   Orders: -     CMP14 + Anion Gap -     Hemoglobin A1c -     POCT urine pregnancy  Major depressive disorder, recurrent episode, moderate (HCC) Assessment & Plan: Depression screen score is improved since last time was checked.    Encounter for hepatitis C screening test for low risk patient Assessment & Plan: Will check Hepatitis C screening due to recent recommendations to screen all adults 18 years and older   Orders: -     Hepatitis C antibody  Shortness of breath Assessment & Plan: Lung sounds are clear unsure of cause. Will check CBC and obtain a CXR  Orders: -     DG Chest 2 View; Future    Return in about 3 months (around 08/02/2023) for physical.  Patient was given opportunity to ask questions. Patient verbalized understanding of the plan and was able to repeat key elements of the plan. All questions were answered to their satisfaction.    Jeanell Sparrow, FNP, have reviewed all documentation for this visit. The documentation on 05/02/23 for the exam, diagnosis, procedures, and orders are all accurate and complete.   IF YOU HAVE BEEN REFERRED TO A SPECIALIST, IT MAY TAKE 1-2 WEEKS TO SCHEDULE/PROCESS THE  REFERRAL. IF YOU HAVE NOT HEARD FROM US/SPECIALIST IN TWO WEEKS, PLEASE GIVE [place] A CALL AT [phone].

## 2023-05-03 LAB — CMP14 + ANION GAP
ALT: 23 [IU]/L (ref 0–32)
AST: 19 [IU]/L (ref 0–40)
Albumin: 4.4 g/dL (ref 3.9–4.9)
Alkaline Phosphatase: 179 [IU]/L — ABNORMAL HIGH (ref 44–121)
Anion Gap: 15 mmol/L (ref 10.0–18.0)
BUN/Creatinine Ratio: 15 (ref 9–23)
BUN: 14 mg/dL (ref 6–20)
Bilirubin Total: <0.2 mg/dL (ref 0.0–1.2)
CO2: 17 mmol/L — ABNORMAL LOW (ref 20–29)
Calcium: 8.9 mg/dL (ref 8.7–10.2)
Chloride: 108 mmol/L — ABNORMAL HIGH (ref 96–106)
Creatinine, Ser: 0.92 mg/dL (ref 0.57–1.00)
Globulin, Total: 3 g/dL (ref 1.5–4.5)
Glucose: 111 mg/dL — ABNORMAL HIGH (ref 70–99)
Potassium: 4.1 mmol/L (ref 3.5–5.2)
Sodium: 140 mmol/L (ref 134–144)
Total Protein: 7.4 g/dL (ref 6.0–8.5)
eGFR: 84 mL/min/{1.73_m2} (ref >59)

## 2023-05-03 LAB — VITAMIN B12: Vitamin B-12: 401 pg/mL (ref 232–1245)

## 2023-05-03 LAB — VITAMIN D 25 HYDROXY (VIT D DEFICIENCY, FRACTURES): Vit D, 25-Hydroxy: 26 ng/mL — ABNORMAL LOW (ref 30.0–100.0)

## 2023-05-03 LAB — IRON,TIBC AND FERRITIN PANEL
Ferritin: 11 ng/mL — ABNORMAL LOW (ref 15–150)
Iron Saturation: 6 % — CL (ref 15–55)
Iron: 26 ug/dL — ABNORMAL LOW (ref 27–159)
Total Iron Binding Capacity: 422 ug/dL (ref 250–450)
UIBC: 396 ug/dL (ref 131–425)

## 2023-05-03 LAB — HEMOGLOBIN A1C
Est. average glucose Bld gHb Est-mCnc: 134 mg/dL
Hgb A1c MFr Bld: 6.3 % — ABNORMAL HIGH (ref 4.8–5.6)

## 2023-05-03 LAB — TSH: TSH: 1.38 u[IU]/mL (ref 0.450–4.500)

## 2023-05-03 LAB — HEPATITIS C ANTIBODY: Hep C Virus Ab: NONREACTIVE

## 2023-05-04 ENCOUNTER — Other Ambulatory Visit: Payer: Self-pay | Admitting: Nurse Practitioner

## 2023-05-04 DIAGNOSIS — E559 Vitamin D deficiency, unspecified: Secondary | ICD-10-CM

## 2023-05-04 MED ORDER — VITAMIN D (ERGOCALCIFEROL) 1.25 MG (50000 UNIT) PO CAPS: 50000.0000 [IU] | ORAL_CAPSULE | ORAL | 1 refills | Status: DC

## 2023-05-08 ENCOUNTER — Telehealth (HOSPITAL_BASED_OUTPATIENT_CLINIC_OR_DEPARTMENT_OTHER): Payer: 59 | Admitting: Psychiatry

## 2023-05-08 ENCOUNTER — Encounter (HOSPITAL_COMMUNITY): Payer: Self-pay | Admitting: Psychiatry

## 2023-05-08 DIAGNOSIS — F900 Attention-deficit hyperactivity disorder, predominantly inattentive type: Secondary | ICD-10-CM

## 2023-05-08 DIAGNOSIS — F410 Panic disorder [episodic paroxysmal anxiety] without agoraphobia: Secondary | ICD-10-CM

## 2023-05-08 DIAGNOSIS — F330 Major depressive disorder, recurrent, mild: Secondary | ICD-10-CM | POA: Diagnosis not present

## 2023-05-08 DIAGNOSIS — F429 Obsessive-compulsive disorder, unspecified: Secondary | ICD-10-CM | POA: Diagnosis not present

## 2023-05-08 MED ORDER — OLANZAPINE-FLUOXETINE HCL 6-50 MG PO CAPS: 1.0000 | ORAL_CAPSULE | Freq: Every day | ORAL | 0 refills | Status: DC

## 2023-05-08 MED ORDER — ALPRAZOLAM 0.5 MG PO TABS: ORAL_TABLET | ORAL | 0 refills | Status: AC

## 2023-05-08 MED ORDER — VORTIOXETINE HBR 10 MG PO TABS: 10.0000 mg | ORAL_TABLET | Freq: Every day | ORAL | 0 refills | Status: DC

## 2023-05-08 NOTE — Progress Notes (Signed)
[facility] MD Virtual Progress Note   Patient Location: Home Provider Location: Office  I connect with patient by video and verified that I am speaking with correct person by using two identifiers. I discussed the limitations of evaluation and management by telemedicine and the availability of in person appointments. I also discussed with the patient that there may be a patient responsible charge related to this service. The patient expressed understanding and agreed to proceed.  Diana Page [phone] 32 y.o.  05/08/2023 10:46 AM  History of Present Illness:  Patient is evaluated by video session.  She reported some anxiety and dysphoria.  She is feeling tired.  She reported her dog has only 4 weeks left and diagnosed with bone cancer.  She also had a visit to primary care and found to have low vitamin D, hemoglobin A1c 6.3 and low iron level.  She was told dehydration and recommended to take vitamin D and iron infusion.  She also very sad about the election news.  She is not sure if she wants to stay in [place] because if she decided to get pregnant and needed abortion for some medical reason and then not sure what will happen.  She had a good support from her husband.  She has taken few times Xanax for panic attacks.  Her last prescription was given in May # 7 tablets.  She denies any crying spells or any suicidal thoughts.  She takes the Ritalin most of the days when she is school.  She is hoping to graduate in the next 2 weeks.  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  She has no tremor or shakes or any EPS.  She is compliant with Symbyax, Trintellix every day.  She wants to continue current medication.  Past Psychiatric History: H/O depression and OCD.  Tried Prozac in [place].  No h/o hallucination, paranoia, suicidal attempt or inpatient treatment.  Symbyax worked. Tried Xanax as needed. Luvox caused sexual side effects and Wellbutrin did not  helped.Vyvanse expensive.     Outpatient Encounter Medications as of 05/08/2023  Medication Sig   acetaZOLAMIDE ER (DIAMOX) 500 MG capsule TAKE 2 CAPSULES (1,000 MG TOTAL) BY MOUTH AT BEDTIME.   ALPRAZolam (XANAX) 0.5 MG tablet Take one tab as needed for severe panic attacks.   fexofenadine (ALLEGRA ODT) 30 MG disintegrating tablet Take 30 mg by mouth daily.   fluticasone (FLONASE) 50 MCG/ACT nasal spray Place 2 sprays into both nostrils daily.   Incontinence Supplies (DRAINABLE FECAL COLLECTOR) MISC by Does not apply route.   methylphenidate (RITALIN) 10 MG tablet Take 1 tablet (10 mg total) by mouth daily.   OLANZapine-FLUoxetine (SYMBYAX) 6-50 MG capsule Take 1 capsule by mouth daily.   ondansetron (ZOFRAN-ODT) 4 MG disintegrating tablet Take 4 mg by mouth every 8 (eight) hours as needed for nausea or vomiting.   Ostomy Supplies MISC by Does not apply route.   pantoprazole (PROTONIX) 40 MG tablet Take 40 mg by mouth daily.   Vitamin D, Ergocalciferol, (DRISDOL) 1.25 MG (50000 UNIT) CAPS capsule Take 1 capsule (50,000 Units total) by mouth every 7 (seven) days.   vortioxetine HBr (TRINTELLIX) 10 MG TABS tablet Take 1 tablet (10 mg total) by mouth daily.   No facility-administered encounter medications on file as of 05/08/2023.    Recent Results (from the past 2160 hour(s))  CMP14 + Anion Gap     Status: Abnormal   Collection Time: 05/02/23 11:56 AM  Result Value  Ref Range   Glucose 111 (H) 70 - 99 mg/dL   BUN 14 6 - 20 mg/dL   Creatinine, Ser 6.29 0.57 - 1.00 mg/dL   eGFR 84 >52 WU/XLK/4.40   BUN/Creatinine Ratio 15 9 - 23   Sodium 140 134 - 144 mmol/L   Potassium 4.1 3.5 - 5.2 mmol/L   Chloride 108 (H) 96 - 106 mmol/L   CO2 17 (L) 20 - 29 mmol/L   Anion Gap 15.0 10.0 - 18.0 mmol/L   Calcium 8.9 8.7 - 10.2 mg/dL   Total Protein 7.4 6.0 - 8.5 g/dL   Albumin 4.4 3.9 - 4.9 g/dL   Globulin, Total 3.0 1.5 - 4.5 g/dL   Bilirubin Total <1.0 0.0 - 1.2 mg/dL   Alkaline Phosphatase  179 (H) 44 - 121 IU/L   AST 19 0 - 40 IU/L   ALT 23 0 - 32 IU/L  Hemoglobin A1c     Status: Abnormal   Collection Time: 05/02/23 11:56 AM  Result Value Ref Range   Hgb A1c MFr Bld 6.3 (H) 4.8 - 5.6 %    Comment:          Prediabetes: 5.7 - 6.4          Diabetes: >6.4          Glycemic control for adults with diabetes: <7.0    Est. average glucose Bld gHb Est-mCnc 134 mg/dL  TSH     Status: None   Collection Time: 05/02/23 11:56 AM  Result Value Ref Range   TSH 1.380 0.450 - 4.500 uIU/mL  Vitamin D (25 hydroxy)     Status: Abnormal   Collection Time: 05/02/23 11:56 AM  Result Value Ref Range   Vit D, 25-Hydroxy 26.0 (L) 30.0 - 100.0 ng/mL    Comment: Vitamin D deficiency has been defined by the Institute of Medicine and an Endocrine Society practice guideline as a level of serum 25-OH vitamin D less than 20 ng/mL (1,2). The Endocrine Society went on to further define vitamin D insufficiency as a level between 21 and 29 ng/mL (2). 1. IOM (Institute of Medicine). 2010. Dietary reference    intakes for calcium and D. [place]: The    [employer]. 2. Holick MF, Binkley Cameron, Bischoff-Ferrari HA, et al.    Evaluation, treatment, and prevention of vitamin D    deficiency: an Endocrine Society clinical practice    guideline. JCEM. 2011 Jul; 96(7):1911-30.   Vitamin B12     Status: None   Collection Time: 05/02/23 11:56 AM  Result Value Ref Range   Vitamin B-12 401 232 - 1,245 pg/mL  Iron, TIBC and Ferritin Panel     Status: Abnormal   Collection Time: 05/02/23 11:56 AM  Result Value Ref Range   Total Iron Binding Capacity 422 250 - 450 ug/dL   UIBC 272 536 - 644 ug/dL   Iron 26 (L) 27 - 034 ug/dL   Iron Saturation 6 (LL) 15 - 55 %   Ferritin 11 (L) 15 - 150 ng/mL  Hepatitis C antibody     Status: None   Collection Time: 05/02/23 11:56 AM  Result Value Ref Range   Hep C Virus Ab Non Reactive Non Reactive    Comment: HCV antibody alone does not differentiate  between previously resolved infection and active infection. Equivocal and Reactive HCV antibody results should be followed up with an HCV RNA test to support the diagnosis of active HCV infection.  Psychiatric Specialty Exam: Physical Exam  Review of Systems  Weight 232 lb (105.2 kg), last menstrual period 04/11/2023.There is no height or weight on file to calculate BMI.  General Appearance: Casual and lying on bed  Eye Contact:  Good  Speech:  Normal Rate  Volume:  Normal  Mood:  Dysphoric  Affect:  Congruent  Thought Process:  Goal Directed  Orientation:  Full (Time, Place, and Person)  Thought Content:  Rumination  Suicidal Thoughts:  No  Homicidal Thoughts:  No  Memory:  Immediate;   Good Recent;   Good Remote;   Good  Judgement:  Intact  Insight:  Present  Psychomotor Activity:  Normal  Concentration:  Concentration: Good and Attention Span: Good  Recall:  Good  Fund of Knowledge:  Good  Language:  Good  Akathisia:  No  Handed:  Right  AIMS (if indicated):     Assets:  Communication Skills Desire for Improvement Housing Resilience Social Support Talents/Skills Transportation  ADL's:  Intact  Cognition:  WNL  Sleep:  ok     Assessment/Plan: Panic attack - Plan: ALPRAZolam (XANAX) 0.5 MG tablet  Attention deficit hyperactivity disorder (ADHD), predominantly inattentive type  MDD (major depressive disorder), recurrent episode, mild (HCC) - Plan: vortioxetine HBr (TRINTELLIX) 10 MG TABS tablet, OLANZapine-FLUoxetine (SYMBYAX) 6-50 MG capsule  Obsessive-compulsive disorder, unspecified type - Plan: OLANZapine-FLUoxetine (SYMBYAX) 6-50 MG capsule  Discussed multiple stressors.  The dog has only 4 weeks left due to bone cancer, feeling tired and has hemoglobin A1c 6.3, low iron, vitamin D.  Needed her infusion and vitamin D.  Patient does not want to change the medication since she feels they are working but like to have a new prescription of Xanax to help  her panic attacks.  Her last prescription of Xanax was given in May with 7 tablets.  We will send a new medication to the pharmacy.  She does not need Ritalin at this time which she takes only during the weekdays.  Continue Trintellix 10 mg daily and Symbyax 6/50 mg daily.  Recommended to call us back if she is any question or any concern.  Follow-up in 3 months.   Follow Up Instructions:     I discussed the assessment and treatment plan with the patient. The patient was provided an opportunity to ask questions and all were answered. The patient agreed with the plan and demonstrated an understanding of the instructions.   The patient was advised to call back or seek an in-person evaluation if the symptoms worsen or if the condition fails to improve as anticipated.    Collaboration of Care: Other provider involved in patient's care AEB notes are available in epic to review  Patient/Guardian was advised Release of Information must be obtained prior to any record release in order to collaborate their care with an outside provider. Patient/Guardian was advised if they have not already done so to contact the registration department to sign all necessary forms in order for [place] to release information regarding their care.   Consent: Patient/Guardian gives verbal consent for treatment and assignment of benefits for services provided during this visit. Patient/Guardian expressed understanding and agreed to proceed.     I provided 23 minutes of non face to face time during this encounter.  Note: This document was prepared by [employer] voice dictation technology and any errors that results from this process are unintentional.    Cleotis Nipper, MD 05/08/2023

## 2023-05-09 ENCOUNTER — Encounter: Payer: Self-pay | Admitting: Nurse Practitioner

## 2023-05-10 ENCOUNTER — Other Ambulatory Visit: Payer: Self-pay | Admitting: Nurse Practitioner

## 2023-05-10 DIAGNOSIS — Z9889 Other specified postprocedural states: Secondary | ICD-10-CM

## 2023-05-10 DIAGNOSIS — D508 Other iron deficiency anemias: Secondary | ICD-10-CM

## 2023-05-10 DIAGNOSIS — L659 Nonscarring hair loss, unspecified: Secondary | ICD-10-CM

## 2023-05-10 DIAGNOSIS — R42 Dizziness and giddiness: Secondary | ICD-10-CM

## 2023-05-11 DIAGNOSIS — R631 Polydipsia: Secondary | ICD-10-CM | POA: Insufficient documentation

## 2023-05-11 DIAGNOSIS — Z1159 Encounter for screening for other viral diseases: Secondary | ICD-10-CM | POA: Insufficient documentation

## 2023-05-11 DIAGNOSIS — R0602 Shortness of breath: Secondary | ICD-10-CM | POA: Insufficient documentation

## 2023-05-11 NOTE — Assessment & Plan Note (Signed)
Lung sounds are clear unsure of cause. Will check CBC and obtain a CXR

## 2023-05-11 NOTE — Assessment & Plan Note (Signed)
Will check Hgb A1c 

## 2023-05-11 NOTE — Assessment & Plan Note (Signed)
Will check for metabolic causes.

## 2023-05-11 NOTE — Assessment & Plan Note (Signed)
Depression screen score is improved since last time was checked.

## 2023-05-11 NOTE — Assessment & Plan Note (Signed)
Slight positive orthos, advised to make sure she is staying well hydrated with water. Will also check her CBC

## 2023-05-11 NOTE — Assessment & Plan Note (Signed)
She is encouraged to strive for BMI less than 30 to decrease cardiac risk. Advised to aim for at least 150 minutes of exercise per week.

## 2023-05-11 NOTE — Assessment & Plan Note (Signed)
Will check Hepatitis C screening due to recent recommendations to screen all adults 18 years and older

## 2023-05-31 ENCOUNTER — Telehealth (HOSPITAL_COMMUNITY): Payer: Self-pay

## 2023-05-31 DIAGNOSIS — F900 Attention-deficit hyperactivity disorder, predominantly inattentive type: Secondary | ICD-10-CM

## 2023-05-31 NOTE — Telephone Encounter (Signed)
Patient is calling for a refill on her Ritalin. Patient was seen on 11/11 and medication was last sent on 10/24. She has a follow up in January and [facility] uses [facility] [address]. Please review and advise, thank you

## 2023-06-01 MED ORDER — METHYLPHENIDATE HCL 10 MG PO TABS: 10.0000 mg | ORAL_TABLET | Freq: Every day | ORAL | 0 refills | Status: DC

## 2023-06-01 NOTE — Telephone Encounter (Signed)
Done

## 2023-06-12 ENCOUNTER — Other Ambulatory Visit: Payer: Self-pay | Admitting: Neurology

## 2023-06-14 ENCOUNTER — Telehealth: Payer: Self-pay | Admitting: Neurology

## 2023-06-14 ENCOUNTER — Encounter: Payer: Self-pay | Admitting: Nurse Practitioner

## 2023-06-14 NOTE — Telephone Encounter (Signed)
Pt requesting refill of acetaZOLAMIDE ER (DIAMOX) 500 MG capsule  [phone]    Pt states the pharmacy was told they could not refill until she see's the Dr. She also scheduled her 1 year f/u appt on 11/07/2023

## 2023-06-15 ENCOUNTER — Inpatient Hospital Stay: Payer: Managed Care, Other (non HMO)

## 2023-06-15 ENCOUNTER — Other Ambulatory Visit: Payer: Self-pay

## 2023-06-15 ENCOUNTER — Inpatient Hospital Stay: Payer: Managed Care, Other (non HMO) | Attending: Hematology and Oncology | Admitting: Hematology and Oncology

## 2023-06-15 VITALS — BP 138/75 | HR 93 | Temp 97.9°F | Resp 18 | Ht 62.4 in | Wt 241.0 lb

## 2023-06-15 DIAGNOSIS — D509 Iron deficiency anemia, unspecified: Secondary | ICD-10-CM | POA: Insufficient documentation

## 2023-06-15 LAB — CBC WITH DIFFERENTIAL (CANCER CENTER ONLY)
Abs Immature Granulocytes: 0.11 10*3/uL — ABNORMAL HIGH (ref 0.00–0.07)
Basophils Absolute: 0.1 10*3/uL (ref 0.0–0.1)
Basophils Relative: 1 %
Eosinophils Absolute: 0.1 10*3/uL (ref 0.0–0.5)
Eosinophils Relative: 1 %
HCT: 35 % — ABNORMAL LOW (ref 36.0–46.0)
Hemoglobin: 10.4 g/dL — ABNORMAL LOW (ref 12.0–15.0)
Immature Granulocytes: 1 %
Lymphocytes Relative: 21 %
Lymphs Abs: 1.8 10*3/uL (ref 0.7–4.0)
MCH: 22.6 pg — ABNORMAL LOW (ref 26.0–34.0)
MCHC: 29.7 g/dL — ABNORMAL LOW (ref 30.0–36.0)
MCV: 75.9 fL — ABNORMAL LOW (ref 80.0–100.0)
Monocytes Absolute: 0.4 10*3/uL (ref 0.1–1.0)
Monocytes Relative: 5 %
Neutro Abs: 6.2 10*3/uL (ref 1.7–7.7)
Neutrophils Relative %: 71 %
Platelet Count: 355 10*3/uL (ref 150–400)
RBC: 4.61 MIL/uL (ref 3.87–5.11)
RDW: 15.6 % — ABNORMAL HIGH (ref 11.5–15.5)
WBC Count: 8.7 10*3/uL (ref 4.0–10.5)
nRBC: 0 % (ref 0.0–0.2)

## 2023-06-15 LAB — IRON AND IRON BINDING CAPACITY (CC-WL,HP ONLY)
Iron: 29 ug/dL (ref 28–170)
Saturation Ratios: 6 % — ABNORMAL LOW (ref 10.4–31.8)
TIBC: 479 ug/dL — ABNORMAL HIGH (ref 250–450)
UIBC: 450 ug/dL — ABNORMAL HIGH (ref 148–442)

## 2023-06-15 LAB — FERRITIN: Ferritin: 7 ng/mL — ABNORMAL LOW (ref 11–307)

## 2023-06-15 MED ORDER — ACETAZOLAMIDE ER 500 MG PO CP12: 1000.0000 mg | ORAL_CAPSULE | Freq: Every evening | ORAL | 5 refills | Status: DC

## 2023-06-15 NOTE — Telephone Encounter (Signed)
Refilled

## 2023-06-15 NOTE — Progress Notes (Signed)
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 She was unaware of her iron deficiency but acknowledged that she may not be absorbing nutrients as she should due to her ileostomy. She recently started taking a liquid multivitamin, but it does not contain iron. The patient also reports experiencing muscle fatigue, dizziness upon standing, and significant hair loss. She has a history of ovarian cysts and has been experiencing irregular menstrual cycles for the past year. She also takes pantoprazole for reflux.     I reviewed her records extensively and collaborated the history with the patient.  SUMMARY OF ONCOLOGIC HISTORY: Oncology History   No history exists.     MEDICAL HISTORY:  Past Medical History:  Diagnosis Date   Anemia    Anxiety    Back pain    Depression    Fatty liver    GERD (gastroesophageal reflux disease)    Headache 01/04/2015   Hx of cholecystectomy    Hypertension    Intercranial Hypertension   IBS (irritable bowel syndrome)    Joint pain    Obsessive compulsive disorder 11/2012   Ostomy nurse consultation    Pancreatitis    hx of    UC (ulcerative colitis) (HCC)     SURGICAL HISTORY: Past Surgical History:  Procedure Laterality Date   CHOLECYSTECTOMY     LAPAROSCOPIC PARTIAL PROTECTOMY     PERMANENT ILEOSTOMY     TOTAL COLECTOMY N/A     SOCIAL HISTORY: Social History   Socioeconomic History   Marital status: Married    Spouse name: Caryn Bee   Number of children: 0   Years of education: Ba   Highest education level: Not on  file  Occupational History   Occupation: Merchandiser, retail     Comment: [employer]  Tobacco Use   Smoking status: Former    Current packs/day: 0.00    Types: Cigarettes, E-cigarettes    Quit date: 06/27/2009    Years since quitting: 13.9   Smokeless tobacco: Never  Vaping Use   Vaping status: Former  Substance and Sexual Activity   Alcohol use: Never    Comment: occasionally, "maybe once or twice a year"   Drug use: No   Sexual activity: Not Currently    Partners: Male    Birth control/protection: Condom  Other Topics Concern   Not on file  Social History Narrative   Lives at home with husband, Caryn Bee.   Caffeine use: maybe 5 big cups daily   Right handed   Social Drivers of Corporate investment banker Strain: Not on file  Food Insecurity: Not on file  Transportation Needs: Not on file  Physical Activity: Not on file  Stress: Not on file  Social Connections: Not on file  Intimate Partner Violence: Not on file    FAMILY HISTORY: Family History  Problem Relation Age of Onset   OCD Mother    Diabetes Mother    Depression Mother    Anxiety disorder Mother    Obesity Mother    Obesity Father    Crohn's disease Father    OCD Sister  Depression Sister    Ulcerative colitis Sister    OCD Brother    Anxiety disorder Brother    Diverticulitis Brother     ALLERGIES:  is allergic to nsaids and tolmetin.  MEDICATIONS:  Current Outpatient Medications  Medication Sig Dispense Refill   acetaZOLAMIDE ER (DIAMOX) 500 MG capsule Take 2 capsules (1,000 mg total) by mouth at bedtime. 60 capsule 5   ALPRAZolam (XANAX) 0.5 MG tablet Take one tab as needed for severe panic attacks. 10 tablet 0   fexofenadine (ALLEGRA ODT) 30 MG disintegrating tablet Take 30 mg by mouth daily.     fluticasone (FLONASE) 50 MCG/ACT nasal spray Place 2 sprays into both nostrils daily.     Incontinence Supplies (DRAINABLE FECAL COLLECTOR) MISC by Does not apply route.     methylphenidate (RITALIN)  10 MG tablet Take 1 tablet (10 mg total) by mouth daily. 30 tablet 0   OLANZapine-FLUoxetine (SYMBYAX) 6-50 MG capsule Take 1 capsule by mouth daily. 90 capsule 0   ondansetron (ZOFRAN-ODT) 4 MG disintegrating tablet Take 4 mg by mouth every 8 (eight) hours as needed for nausea or vomiting.     Ostomy Supplies MISC by Does not apply route.     pantoprazole (PROTONIX) 40 MG tablet Take 40 mg by mouth daily.     Vitamin D, Ergocalciferol, (DRISDOL) 1.25 MG (50000 UNIT) CAPS capsule Take 1 capsule (50,000 Units total) by mouth every 7 (seven) days. 12 capsule 1   vortioxetine HBr (TRINTELLIX) 10 MG TABS tablet Take 1 tablet (10 mg total) by mouth daily. 90 tablet 0   No current facility-administered medications for this visit.    REVIEW OF SYSTEMS:   Constitutional: Denies fevers, chills or abnormal night sweats Complains of fatigue and generalized weakness All other systems were reviewed with the patient and are negative.  PHYSICAL EXAMINATION: ECOG PERFORMANCE STATUS: 1 - Symptomatic but completely ambulatory  Vitals:   06/15/23 1311  BP: 138/75  Pulse: 93  Resp: 18  Temp: 97.9 F (36.6 C)  SpO2: 99%   Filed Weights   06/15/23 1311  Weight: 241 lb (109.3 kg)    GENERAL:alert, no distress and comfortable  LABORATORY DATA:  I have reviewed the data as listed Lab Results  Component Value Date   WBC 8.7 06/15/2023   HGB 10.4 (L) 06/15/2023   HCT 35.0 (L) 06/15/2023   MCV 75.9 (L) 06/15/2023   PLT 355 06/15/2023   Lab Results  Component Value Date   NA 140 05/02/2023   K 4.1 05/02/2023   CL 108 (H) 05/02/2023   CO2 17 (L) 05/02/2023    RADIOGRAPHIC STUDIES: I have personally reviewed the radiological reports and agreed with the findings in the report.  ASSESSMENT AND PLAN:  Iron deficiency anemia Lab review: 05/02/2023: Creatinine 0.92 TSH 1.3, vitamin D 26, B12 401, iron saturation 6%, ferritin 11  Iron deficiency anemia: I discussed with the patient the process  of iron absorption. I counseled extensively regarding the different causes of iron deficiency including blood loss and malabsorption. Patient has had upper endoscopies and colonoscopies and did not have any clear identified source of blood loss. It is possible that the patient may still have an occult source of bleeding. However malabsorption is also a possibility.   Recommendation: 1. proceed with IV iron infusions  I discussed with the patient that potentially there may be a need for additional IV iron infusions if the iron levels were to remain low in the future. The frequency  of need of IV iron would depend on many other factors including the rate of loss and by the degree of absorption.  Return to clinic in 2 months with recheck on iron studies and hemoglobin.    All questions were answered. The patient knows to call the clinic with any problems, questions or concerns.    Tamsen Meek, MD 06/15/23

## 2023-06-15 NOTE — Assessment & Plan Note (Signed)
Lab review: 05/02/2023: Creatinine 0.92 TSH 1.3, vitamin D 26, B12 401, iron saturation 6%, ferritin 11  Iron deficiency anemia: I discussed with the patient the process of iron absorption. I counseled extensively regarding the different causes of iron deficiency including blood loss and malabsorption. Patient has had upper endoscopies and colonoscopies and did not have any clear identified source of blood loss. It is possible that the patient may still have an occult source of bleeding. However malabsorption is also a possibility.   Recommendation: 1. proceed with IV iron infusions  I discussed with the patient that potentially there may be a need for additional IV iron infusions if the iron levels were to remain low in the future. The frequency of need of IV iron would depend on many other factors including the rate of loss and by the degree of absorption.  Return to clinic in 3 months with recheck on iron studies and hemoglobin.

## 2023-07-03 ENCOUNTER — Telehealth (HOSPITAL_COMMUNITY): Payer: 59 | Admitting: Psychiatry

## 2023-07-12 ENCOUNTER — Other Ambulatory Visit: Payer: Self-pay | Admitting: Hematology and Oncology

## 2023-07-13 ENCOUNTER — Inpatient Hospital Stay: Payer: Managed Care, Other (non HMO) | Attending: Hematology and Oncology

## 2023-07-13 VITALS — BP 119/62 | HR 79 | Temp 98.1°F | Resp 12

## 2023-07-13 DIAGNOSIS — D509 Iron deficiency anemia, unspecified: Secondary | ICD-10-CM | POA: Diagnosis present

## 2023-07-13 MED ORDER — SODIUM CHLORIDE 0.9 % IV SOLN: INTRAVENOUS | Status: DC

## 2023-07-13 MED ORDER — SODIUM CHLORIDE 0.9 % IV SOLN
300.0000 mg | Freq: Once | INTRAVENOUS | Status: AC
  Administered 2023-07-13: 300 mg via INTRAVENOUS
  Filled 2023-07-13: qty 15

## 2023-07-13 NOTE — Progress Notes (Signed)
Patient observed for 30 min post iron infusion.  Tolerated treatment well without incident.  VSS at discharge.  Ambulated to lobby.

## 2023-07-13 NOTE — Patient Instructions (Addendum)
Iron Sucrose Injection What is this medication? IRON SUCROSE (EYE ern SOO krose) treats low levels of iron (iron deficiency anemia) in people with kidney disease. Iron is a mineral that plays an important role in making red blood cells, which carry oxygen from your lungs to the rest of your body. This medicine may be used for other purposes; ask your health care provider or pharmacist if you have questions. COMMON BRAND NAME(S): Venofer What should I tell my care team before I take this medication? They need to know if you have any of these conditions: Anemia not caused by low iron levels Heart disease High levels of iron in the blood Kidney disease Liver disease An unusual or allergic reaction to iron, other medications, foods, dyes, or preservatives Pregnant or trying to get pregnant Breastfeeding How should I use this medication? This medication is for infusion into a vein. It is given in a hospital or clinic setting. Talk to your care team about the use of this medication in children. While this medication may be prescribed for children as young as 2 years for selected conditions, precautions do apply. Overdosage: If you think you have taken too much of this medicine contact a poison control center or emergency room at once. NOTE: This medicine is only for you. Do not share this medicine with others. What if I miss a dose? Keep appointments for follow-up doses. It is important not to miss your dose. Call your care team if you are unable to keep an appointment. What may interact with this medication? Do not take this medication with any of the following: Deferoxamine Dimercaprol Other iron products This medication may also interact with the following: Chloramphenicol Deferasirox This list may not describe all possible interactions. Give your health care provider a list of all the medicines, herbs, non-prescription drugs, or dietary supplements you use. Also tell them if you smoke,  drink alcohol, or use illegal drugs. Some items may interact with your medicine. What should I watch for while using this medication? Visit your care team regularly. Tell your care team if your symptoms do not start to get better or if they get worse. You may need blood work done while you are taking this medication. You may need to follow a special diet. Talk to your care team. Foods that contain iron include: whole grains/cereals, dried fruits, beans, or peas, leafy green vegetables, and organ meats (liver, kidney). What side effects may I notice from receiving this medication? Side effects that you should report to your care team as soon as possible: Allergic reactions--skin rash, itching, hives, swelling of the face, lips, tongue, or throat Low blood pressure--dizziness, feeling faint or lightheaded, blurry vision Shortness of breath Side effects that usually do not require medical attention (report to your care team if they continue or are bothersome): Flushing Headache Joint pain Muscle pain Nausea Pain, redness, or irritation at injection site This list may not describe all possible side effects. Call your doctor for medical advice about side effects. You may report side effects to FDA at 1-800-FDA-1088. Where should I keep my medication? This medication is given in a hospital or clinic. It will not be stored at home. NOTE: This sheet is a summary. It may not cover all possible information. If you have questions about this medicine, talk to your doctor, pharmacist, or health care provider.  2024 Elsevier/Gold Standard (2022-11-18 00:00:00)  Rehydration, Adult Rehydration is the replacement of fluids, salts, and minerals in the body (electrolytes) that are  lost during dehydration. Dehydration is when there is not enough water or other fluids in the body. This happens when you lose more fluids than you take in. Common causes of dehydration include: Not drinking enough fluids. This can  occur when you are ill or doing activities that require a lot of energy, especially in hot weather. Conditions that cause loss of water or other fluids. These include diarrhea, vomiting, sweating, and urinating a lot. Other illnesses, such as fever or infection. Certain medicines, such as those that remove excess fluid from the body (diuretics). Symptoms of mild or moderate dehydration may include thirst, dry lips and mouth, and dizziness. Symptoms of severe dehydration may include increased heart rate, confusion, fainting, and not urinating. In severe cases, you may need to get fluids through an IV at the hospital. For mild or moderate cases, you can usually rehydrate at home by drinking certain fluids as told by your health care provider. What are the risks? Your health care provider will talk with you about risks. Your health care provider will talk with you about risks. This may include taking in too much fluid (overhydration). This is rare. Overhydration can cause an imbalance of electrolytes in the body, kidney failure, or a decrease in salt (sodium) levels in the body. Supplies needed: You will need an oral rehydration solution (ORS) if your health care provider tells you to use one. This is a drink to treat dehydration. It can be found in pharmacies and retail stores. How to rehydrate Fluids Follow instructions from your health care provider about what to drink. The kind of fluid and the amount you should drink depend on your condition. In general, you should choose drinks that you prefer. If told by your health care provider, drink an ORS. Make an ORS by following instructions on the package. Start by drinking small amounts, about  cup (120 mL) every 5-10 minutes. Slowly increase how much you drink until you have taken in the amount recommended by your health care provider. Drink enough clear fluids to keep your urine pale yellow. If you were told to drink an ORS, finish it first, then  start slowly drinking other clear fluids. Drink fluids such as: Water. This includes sparkling and flavored water. Drinking only water can lead to having too little sodium in your body (hyponatremia). Follow the advice of your health care provider. Water from ice chips you suck on. Fruit juice with water added to it (diluted). Sports drinks. Hot or cold herbal teas. Broth-based soups. Milk or milk products. Food Follow instructions from your health care provider about what to eat while you rehydrate. Your health care provider may recommend that you slowly begin eating regular foods in small amounts. Eat foods that contain a healthy balance of electrolytes, such as bananas, oranges, potatoes, tomatoes, and spinach. Avoid foods that are greasy or contain a lot of sugar. In some cases, you may get nutrition through a feeding tube that is passed through your nose and into your stomach (nasogastric tube, or NG tube). This may be done if you have uncontrolled vomiting or diarrhea. Drinks to avoid  Certain drinks may make dehydration worse. While you rehydrate, avoid drinking alcohol. How to tell if you are recovering from dehydration You may be getting better if: You are urinating more often than before you started rehydrating. Your urine is pale yellow. Your energy level improves. You vomit less often. You have diarrhea less often. Your appetite improves or returns to normal. You feel less  dizzy or light-headed. Your skin tone and color start to look more normal. Follow these instructions at home: Take over-the-counter and prescription medicines only as told by your health care provider. Do not take sodium tablets. Doing this can lead to having too much sodium in your body (hypernatremia). Contact a health care provider if: You continue to have symptoms of mild or moderate dehydration, such as: Thirst. Dry lips. Slightly dry mouth. Dizziness. Dark urine or less urine than  normal. Muscle cramps. You continue to vomit or have diarrhea. Get help right away if: You have symptoms of dehydration that get worse. You have a fever. You have a severe headache. You have been vomiting and have problems, such as: Your vomiting gets worse or does not go away. Your vomit includes blood or green matter (bile). You cannot eat or drink without vomiting. You have problems with urination or bowel movements, such as: Diarrhea that gets worse or does not go away. Blood in your stool (feces). This may cause stool to look black and tarry. Not urinating, or urinating only a small amount of very dark urine, within 6-8 hours. You have trouble breathing. You have symptoms that get worse with treatment. These symptoms may be an emergency. Get help right away. Call 911. Do not wait to see if the symptoms will go away. Do not drive yourself to the hospital. This information is not intended to replace advice given to you by your health care provider. Make sure you discuss any questions you have with your health care provider. Document Revised: 10/25/2021 Document Reviewed: 10/25/2021 Elsevier Patient Education  2024 [employer].

## 2023-07-18 ENCOUNTER — Encounter (HOSPITAL_COMMUNITY): Payer: Self-pay | Admitting: Psychiatry

## 2023-07-18 ENCOUNTER — Telehealth (HOSPITAL_COMMUNITY): Payer: 59 | Admitting: Psychiatry

## 2023-07-18 ENCOUNTER — Telehealth: Payer: Self-pay | Admitting: Hematology and Oncology

## 2023-07-18 VITALS — Wt 241.0 lb

## 2023-07-18 DIAGNOSIS — F900 Attention-deficit hyperactivity disorder, predominantly inattentive type: Secondary | ICD-10-CM | POA: Diagnosis not present

## 2023-07-18 DIAGNOSIS — F429 Obsessive-compulsive disorder, unspecified: Secondary | ICD-10-CM | POA: Diagnosis not present

## 2023-07-18 DIAGNOSIS — F330 Major depressive disorder, recurrent, mild: Secondary | ICD-10-CM | POA: Diagnosis not present

## 2023-07-18 MED ORDER — OLANZAPINE-FLUOXETINE HCL 6-50 MG PO CAPS: 1.0000 | ORAL_CAPSULE | Freq: Every day | ORAL | 0 refills | Status: DC

## 2023-07-18 MED ORDER — VORTIOXETINE HBR 10 MG PO TABS: 10.0000 mg | ORAL_TABLET | Freq: Every day | ORAL | 0 refills | Status: DC

## 2023-07-18 MED ORDER — METHYLPHENIDATE HCL 10 MG PO TABS: 10.0000 mg | ORAL_TABLET | Freq: Every day | ORAL | 0 refills | Status: DC

## 2023-07-18 NOTE — Telephone Encounter (Signed)
Spoke with patient confirming upcoming appointment change

## 2023-07-18 NOTE — Progress Notes (Signed)
[facility] MD Virtual Progress Note   Patient Location: Home Provider Location: Home Office  I connect with patient by video and verified that I am speaking with correct person by using two identifiers. I discussed the limitations of evaluation and management by telemedicine and the availability of in person appointments. I also discussed with the patient that there may be a patient responsible charge related to this service. The patient expressed understanding and agreed to proceed.  Diana Page [id number] 33 y.o.  07/18/2023 10:44 AM  History of Present Illness:  Patient is evaluated by video session.  She is graduating last December and now she is looking for a job.  She admitted to cut down her Ritalin and not taking every day but does want continue as there are days when she is struggling with attention and focus.  She started the weight loss program at [facility] and hoping that will help weight loss.  Her last hemoglobin A1c is 6.3.  She denies any major panic attack or crying spells.  Her last sentence was earlier this month when she flew to [place].  She gets very nervous and anxious before flying.  She denies any hallucination, paranoia, suicidal thoughts.  Her mood is stable.  Her obsessive thoughts are stable and she liked the combination of Trintellix and Symbyax.  She also started iron infusion and so far she has been infusion and she noticed marginal improvement.  She has 2 more to go.  She has no tremors, shakes or any EPS.  She discussed that long-term plan to have family and to get a job.  She denies drinking or using any illegal substances.  Her husband is very supportive and cooperative.  Past Psychiatric History: H/O depression and OCD.  Tried Prozac in [place].  No h/o hallucination, paranoia, suicidal attempt or inpatient treatment.  Symbyax worked. Tried Xanax as needed. Luvox caused sexual side effects and Wellbutrin did not helped.Vyvanse expensive.      Outpatient Encounter Medications as of 07/18/2023  Medication Sig   acetaZOLAMIDE ER (DIAMOX) 500 MG capsule Take 2 capsules (1,000 mg total) by mouth at bedtime.   ALPRAZolam (XANAX) 0.5 MG tablet Take one tab as needed for severe panic attacks.   fexofenadine (ALLEGRA ODT) 30 MG disintegrating tablet Take 30 mg by mouth daily.   fluticasone (FLONASE) 50 MCG/ACT nasal spray Place 2 sprays into both nostrils daily.   Incontinence Supplies (DRAINABLE FECAL COLLECTOR) MISC by Does not apply route.   methylphenidate (RITALIN) 10 MG tablet Take 1 tablet (10 mg total) by mouth daily.   OLANZapine-FLUoxetine (SYMBYAX) 6-50 MG capsule Take 1 capsule by mouth daily.   ondansetron (ZOFRAN-ODT) 4 MG disintegrating tablet Take 4 mg by mouth every 8 (eight) hours as needed for nausea or vomiting.   Ostomy Supplies MISC by Does not apply route.   pantoprazole (PROTONIX) 40 MG tablet Take 40 mg by mouth daily.   Vitamin D, Ergocalciferol, (DRISDOL) 1.25 MG (50000 UNIT) CAPS capsule Take 1 capsule (50,000 Units total) by mouth every 7 (seven) days.   vortioxetine HBr (TRINTELLIX) 10 MG TABS tablet Take 1 tablet (10 mg total) by mouth daily.   No facility-administered encounter medications on file as of 07/18/2023.    Recent Results (from the past 2160 hours)  CMP14 + Anion Gap     Status: Abnormal   Collection Time: 05/02/23 11:56 AM  Result Value Ref Range   Glucose 111 (H) 70 - 99 mg/dL   BUN  14 6 - 20 mg/dL   Creatinine, Ser 7.84 0.57 - 1.00 mg/dL   eGFR 84 >69 GE/XBM/8.41   BUN/Creatinine Ratio 15 9 - 23   Sodium 140 134 - 144 mmol/L   Potassium 4.1 3.5 - 5.2 mmol/L   Chloride 108 (H) 96 - 106 mmol/L   CO2 17 (L) 20 - 29 mmol/L   Anion Gap 15.0 10.0 - 18.0 mmol/L   Calcium 8.9 8.7 - 10.2 mg/dL   Total Protein 7.4 6.0 - 8.5 g/dL   Albumin 4.4 3.9 - 4.9 g/dL   Globulin, Total 3.0 1.5 - 4.5 g/dL   Bilirubin Total <3.2 0.0 - 1.2 mg/dL   Alkaline Phosphatase 179 (H) 44 - 121 IU/L   AST 19 0  - 40 IU/L   ALT 23 0 - 32 IU/L  Hemoglobin A1c     Status: Abnormal   Collection Time: 05/02/23 11:56 AM  Result Value Ref Range   Hgb A1c MFr Bld 6.3 (H) 4.8 - 5.6 %    Comment:          Prediabetes: 5.7 - 6.4          Diabetes: >6.4          Glycemic control for adults with diabetes: <7.0    Est. average glucose Bld gHb Est-mCnc 134 mg/dL  TSH     Status: None   Collection Time: 05/02/23 11:56 AM  Result Value Ref Range   TSH 1.380 0.450 - 4.500 uIU/mL  Vitamin D (25 hydroxy)     Status: Abnormal   Collection Time: 05/02/23 11:56 AM  Result Value Ref Range   Vit D, 25-Hydroxy 26.0 (L) 30.0 - 100.0 ng/mL    Comment: Vitamin D deficiency has been defined by the Institute of Medicine and an Endocrine Society practice guideline as a level of serum 25-OH vitamin D less than 20 ng/mL (1,2). The Endocrine Society went on to further define vitamin D insufficiency as a level between 21 and 29 ng/mL (2). 1. IOM (Institute of Medicine). 2010. Dietary reference    intakes for calcium and D. [place]: The    [employer]. 2. Holick MF, Binkley Carpenter, Bischoff-Ferrari HA, et al.    Evaluation, treatment, and prevention of vitamin D    deficiency: an Endocrine Society clinical practice    guideline. JCEM. 2011 Jul; 96(7):1911-30.   Vitamin B12     Status: None   Collection Time: 05/02/23 11:56 AM  Result Value Ref Range   Vitamin B-12 401 232 - 1,245 pg/mL  Iron, TIBC and Ferritin Panel     Status: Abnormal   Collection Time: 05/02/23 11:56 AM  Result Value Ref Range   Total Iron Binding Capacity 422 250 - 450 ug/dL   UIBC 440 102 - 725 ug/dL   Iron 26 (L) 27 - 366 ug/dL   Iron Saturation 6 (LL) 15 - 55 %   Ferritin 11 (L) 15 - 150 ng/mL  Hepatitis C antibody     Status: None   Collection Time: 05/02/23 11:56 AM  Result Value Ref Range   Hep C Virus Ab Non Reactive Non Reactive    Comment: HCV antibody alone does not differentiate between previously resolved  infection and active infection. Equivocal and Reactive HCV antibody results should be followed up with an HCV RNA test to support the diagnosis of active HCV infection.   Iron and Iron Binding Capacity ([facility] only)     Status: Abnormal   Collection Time:  06/15/23  1:59 PM  Result Value Ref Range   Iron 29 28 - 170 ug/dL   TIBC 474 (H) 259 - 563 ug/dL   Saturation Ratios 6 (L) 10.4 - 31.8 %   UIBC 450 (H) 148 - 442 ug/dL    Comment: Performed at [facility], [address], [place], [place] [address]  CBC with Differential (Cancer Center Only)     Status: Abnormal   Collection Time: 06/15/23  1:59 PM  Result Value Ref Range   WBC Count 8.7 4.0 - 10.5 K/uL   RBC 4.61 3.87 - 5.11 MIL/uL   Hemoglobin 10.4 (L) 12.0 - 15.0 g/dL    Comment: Reticulocyte Hemoglobin testing may be clinically indicated, consider ordering this additional test [id number]    HCT 35.0 (L) 36.0 - 46.0 %   MCV 75.9 (L) 80.0 - 100.0 fL   MCH 22.6 (L) 26.0 - 34.0 pg   MCHC 29.7 (L) 30.0 - 36.0 g/dL   RDW 41.6 (H) 60.6 - 30.1 %   Platelet Count 355 150 - 400 K/uL   nRBC 0.0 0.0 - 0.2 %   Neutrophils Relative % 71 %   Neutro Abs 6.2 1.7 - 7.7 K/uL   Lymphocytes Relative 21 %   Lymphs Abs 1.8 0.7 - 4.0 K/uL   Monocytes Relative 5 %   Monocytes Absolute 0.4 0.1 - 1.0 K/uL   Eosinophils Relative 1 %   Eosinophils Absolute 0.1 0.0 - 0.5 K/uL   Basophils Relative 1 %   Basophils Absolute 0.1 0.0 - 0.1 K/uL   Immature Granulocytes 1 %   Abs Immature Granulocytes 0.11 (H) 0.00 - 0.07 K/uL    Comment: Performed at [facility], [address] [place], [place] [address]  Ferritin     Status: Abnormal   Collection Time: 06/15/23  2:00 PM  Result Value Ref Range   Ferritin 7 (L) 11 - 307 ng/mL    Comment: Performed at [facility], [address], [place], [place] [address]     Psychiatric Specialty Exam: Physical Exam  Review of Systems   Weight 241 lb (109.3 kg).There is no height or weight on file to calculate BMI.  General Appearance: Casual  Eye Contact:  Good  Speech:  Normal Rate  Volume:  Normal  Mood:  Euthymic  Affect:  Congruent  Thought Process:  Goal Directed  Orientation:  Full (Time, Place, and Person)  Thought Content:  WDL  Suicidal Thoughts:  No  Homicidal Thoughts:  No  Memory:  Immediate;   Good Recent;   Good Remote;   Good  Judgement:  Intact  Insight:  Present  Psychomotor Activity:  Normal  Concentration:  Concentration: Good and Attention Span: Good  Recall:  Good  Fund of Knowledge:  Good  Language:  Good  Akathisia:  No  Handed:  Right  AIMS (if indicated):     Assets:  Communication Skills Desire for Improvement Financial Resources/Insurance Housing Social Support Transportation  ADL's:  Intact  Cognition:  WNL  Sleep:  ok     Assessment/Plan: MDD (major depressive disorder), recurrent episode, mild (HCC) - Plan: OLANZapine-FLUoxetine (SYMBYAX) 6-50 MG capsule, vortioxetine HBr (TRINTELLIX) 10 MG TABS tablet  Obsessive-compulsive disorder, unspecified type - Plan: OLANZapine-FLUoxetine (SYMBYAX) 6-50 MG capsule  Attention deficit hyperactivity disorder (ADHD), predominantly inattentive type - Plan: methylphenidate (RITALIN) 10 MG tablet  I discussed blood work results.  She started out infusion and that is helping.  Discussed medication as patient like to have family in the future.  We discussed in that case she need to stop the Ritalin but patient like to keep the medication to take when she struggled with attention and focus and she is aware that if she gets pregnant and that she need to stop the Ritalin.  I will continue Symbyax 6/50 mg daily, Trintellix 10 mg daily.  Her attention concentration and multitasking is good when she takes the stimulant but she has overall cut down from the past as she recently graduated.  She does not need a new prescription of Xanax at this time.   We discussed controlled substance abuse, dependency.  We will follow-up in 3 months.   Follow Up Instructions:     I discussed the assessment and treatment plan with the patient. The patient was provided an opportunity to ask questions and all were answered. The patient agreed with the plan and demonstrated an understanding of the instructions.   The patient was advised to call back or seek an in-person evaluation if the symptoms worsen or if the condition fails to improve as anticipated.    Collaboration of Care: Other provider involved in patient's care AEB notes are available in epic to review  Patient/Guardian was advised Release of Information must be obtained prior to any record release in order to collaborate their care with an outside provider. Patient/Guardian was advised if they have not already done so to contact the registration department to sign all necessary forms in order for [place] to release information regarding their care.   Consent: Patient/Guardian gives verbal consent for treatment and assignment of benefits for services provided during this visit. Patient/Guardian expressed understanding and agreed to proceed.     I provided 23 minutes of non face to face time during this encounter.  Note: This document was prepared by [employer] voice dictation technology and any errors that results from this process are unintentional.    Cleotis Nipper, MD 07/18/2023

## 2023-07-20 ENCOUNTER — Inpatient Hospital Stay: Payer: Managed Care, Other (non HMO)

## 2023-07-20 VITALS — BP 130/72 | HR 93 | Temp 98.5°F | Resp 17

## 2023-07-20 DIAGNOSIS — D509 Iron deficiency anemia, unspecified: Secondary | ICD-10-CM | POA: Diagnosis not present

## 2023-07-20 MED ORDER — SODIUM CHLORIDE 0.9 % IV SOLN: INTRAVENOUS | Status: DC

## 2023-07-20 MED ORDER — SODIUM CHLORIDE 0.9 % IV SOLN
300.0000 mg | Freq: Once | INTRAVENOUS | Status: AC
  Administered 2023-07-20: 300 mg via INTRAVENOUS
  Filled 2023-07-20: qty 300

## 2023-07-20 NOTE — Progress Notes (Signed)
Tolerated treatment well, vitals WNL, patient declined wait.

## 2023-07-28 ENCOUNTER — Inpatient Hospital Stay: Payer: Managed Care, Other (non HMO)

## 2023-07-28 VITALS — BP 110/59 | HR 77 | Temp 98.9°F | Resp 16

## 2023-07-28 DIAGNOSIS — D509 Iron deficiency anemia, unspecified: Secondary | ICD-10-CM

## 2023-07-28 MED ORDER — SODIUM CHLORIDE 0.9 % IV SOLN
INTRAVENOUS | Status: DC
Start: 2023-07-28 — End: 2023-07-28

## 2023-07-28 MED ORDER — SODIUM CHLORIDE 0.9 % IV SOLN
300.0000 mg | Freq: Once | INTRAVENOUS | Status: AC
  Administered 2023-07-28: 300 mg via INTRAVENOUS
  Filled 2023-07-28: qty 300

## 2023-07-28 NOTE — Progress Notes (Signed)
Patient is here for her third iron infusion, she reports  that she has been having possible heart palpitations which she describes as a "heart hiccup." It happens mostly at night, usually only has one "hiccup" at a time but it happens up to 10 times a day. She denies any chest pain, this has been happening since late December, iron infusions started 07/13/23.  She says she called her insurance nurse line & was told this would get better with the iron infusions but she feels it is getting worse, no cardiac hx  Dr Al Pimple informed, she recommends that patient see her PCP for possible referral for cardiac workup.  Patient informed, she verbalizes understanding.  Patient declined to stay for 30-minute post iron observation.

## 2023-08-09 ENCOUNTER — Encounter: Payer: Managed Care, Other (non HMO) | Admitting: Nurse Practitioner

## 2023-08-09 NOTE — Progress Notes (Deleted)
 Madelaine Bhat, CMA,acting as a Neurosurgeon for [name], FNP.,have documented all relevant documentation on the behalf of [name], FNP,as directed by  [name], FNP while in the presence of [name], FNP.  Subjective:    Patient ID: Diana Page , female    DOB: 02/07/1991 , 33 y.o.   MRN: [id number]  No chief complaint on file.   HPI  Patient presents today for HM, Patient reports compliance with medication. Patient denies any chest pain, SOB, or headaches. Patient has no concerns today.     Past Medical History:  Diagnosis Date  . Anemia   . Anxiety   . Back pain   . Depression   . Fatty liver   . GERD (gastroesophageal reflux disease)   . Headache 01/04/2015  . Hx of cholecystectomy   . Hypertension    Intercranial Hypertension  . IBS (irritable bowel syndrome)   . Joint pain   . Obsessive compulsive disorder 11/2012  . Ostomy nurse consultation   . Pancreatitis    hx of   . UC (ulcerative colitis) (HCC)      Family History  Problem Relation Age of Onset  . OCD Mother   . Diabetes Mother   . Depression Mother   . Anxiety disorder Mother   . Obesity Mother   . Obesity Father   . Crohn's disease Father   . OCD Sister   . Depression Sister   . Ulcerative colitis Sister   . OCD Brother   . Anxiety disorder Brother   . Diverticulitis Brother      Current Outpatient Medications:  .  acetaZOLAMIDE ER (DIAMOX) 500 MG capsule, Take 2 capsules (1,000 mg total) by mouth at bedtime., Disp: 60 capsule, Rfl: 5 .  ALPRAZolam (XANAX) 0.5 MG tablet, Take one tab as needed for severe panic attacks., Disp: 10 tablet, Rfl: 0 .  Biotin 2500 MCG CHEW, Chew 1 tablet by mouth daily., Disp: , Rfl:  .  fexofenadine (ALLEGRA ODT) 30 MG disintegrating tablet, Take 30 mg by mouth daily., Disp: , Rfl:  .  fluticasone (FLONASE) 50 MCG/ACT nasal spray, Place 2 sprays into both nostrils daily., Disp: , Rfl:  .  Incontinence Supplies (DRAINABLE FECAL COLLECTOR) MISC, by  Does not apply route., Disp: , Rfl:  .  methylphenidate (RITALIN) 10 MG tablet, Take 1 tablet (10 mg total) by mouth daily., Disp: 30 tablet, Rfl: 0 .  OLANZapine-FLUoxetine (SYMBYAX) 6-50 MG capsule, Take 1 capsule by mouth daily., Disp: 90 capsule, Rfl: 0 .  ondansetron (ZOFRAN-ODT) 4 MG disintegrating tablet, Take 4 mg by mouth every 8 (eight) hours as needed for nausea or vomiting., Disp: , Rfl:  .  Ostomy Supplies MISC, by Does not apply route., Disp: , Rfl:  .  pantoprazole (PROTONIX) 40 MG tablet, Take 40 mg by mouth daily., Disp: , Rfl:  .  SEMAGLUTIDE-WEIGHT MANAGEMENT [place], Inject 5 mg into the skin once a week., Disp: , Rfl:  .  Vitamin D, Ergocalciferol, (DRISDOL) 1.25 MG (50000 UNIT) CAPS capsule, Take 1 capsule (50,000 Units total) by mouth every 7 (seven) days., Disp: 12 capsule, Rfl: 1 .  vortioxetine HBr (TRINTELLIX) 10 MG TABS tablet, Take 1 tablet (10 mg total) by mouth daily., Disp: 90 tablet, Rfl: 0   Allergies  Allergen Reactions  . Nsaids Other (See Comments)    Cannot have because of her Ulcerative Colitis  . Tolmetin Other (See Comments)    Cannot have because of her Ulcerative Colitis  The patient states she uses {contraceptive methods:5051} for birth control. No LMP recorded.. {Dysmenorrhea-menorrhagia:21918}. Negative for: breast discharge, breast lump(s), breast pain and breast self exam. Associated symptoms include abnormal vaginal bleeding. Pertinent negatives include abnormal bleeding (hematology), anxiety, decreased libido, depression, difficulty falling sleep, dyspareunia, history of infertility, nocturia, sexual dysfunction, sleep disturbances, urinary incontinence, urinary urgency, vaginal discharge and vaginal itching. Diet regular.The patient states her exercise level is    . The patient's tobacco use is:  Social History   Tobacco Use  Smoking Status Former  . Current packs/day: 0.00  . Types: Cigarettes, E-cigarettes  . Quit date: 06/27/2009  . Years  since quitting: 14.1  Smokeless Tobacco Never  . She has been exposed to passive smoke. The patient's alcohol use is:  Social History   Substance and Sexual Activity  Alcohol Use Never   Comment: occasionally, "maybe once or twice a year"  . Additional information: Last pap ***, next one scheduled for ***.    Review of Systems   There were no vitals filed for this visit. There is no height or weight on file to calculate BMI.  Wt Readings from Last 3 Encounters:  06/15/23 241 lb (109.3 kg)  05/02/23 232 lb (105.2 kg)  08/07/22 229 lb 15 oz (104.3 kg)     Objective:  Physical Exam      Assessment And Plan:     Encounter for annual health examination  Iron deficiency anemia secondary to inadequate dietary iron intake     No follow-ups on file. Patient was given opportunity to ask questions. Patient verbalized understanding of the plan and was able to repeat key elements of the plan. All questions were answered to their satisfaction.   [name], FNP  I, [name], FNP, have reviewed all documentation for this visit. The documentation on 08/09/23 for the exam, diagnosis, procedures, and orders are all accurate and complete.

## 2023-08-15 ENCOUNTER — Inpatient Hospital Stay: Payer: Managed Care, Other (non HMO) | Attending: Hematology and Oncology

## 2023-08-15 DIAGNOSIS — D509 Iron deficiency anemia, unspecified: Secondary | ICD-10-CM | POA: Insufficient documentation

## 2023-08-15 LAB — CBC WITH DIFFERENTIAL (CANCER CENTER ONLY)
Abs Immature Granulocytes: 0.07 10*3/uL (ref 0.00–0.07)
Basophils Absolute: 0.1 10*3/uL (ref 0.0–0.1)
Basophils Relative: 1 %
Eosinophils Absolute: 0.1 10*3/uL (ref 0.0–0.5)
Eosinophils Relative: 1 %
HCT: 38.9 % (ref 36.0–46.0)
Hemoglobin: 12.1 g/dL (ref 12.0–15.0)
Immature Granulocytes: 1 %
Lymphocytes Relative: 27 %
Lymphs Abs: 2.4 10*3/uL (ref 0.7–4.0)
MCH: 24.6 pg — ABNORMAL LOW (ref 26.0–34.0)
MCHC: 31.1 g/dL (ref 30.0–36.0)
MCV: 79.2 fL — ABNORMAL LOW (ref 80.0–100.0)
Monocytes Absolute: 0.5 10*3/uL (ref 0.1–1.0)
Monocytes Relative: 6 %
Neutro Abs: 5.7 10*3/uL (ref 1.7–7.7)
Neutrophils Relative %: 64 %
Platelet Count: 388 10*3/uL (ref 150–400)
RBC: 4.91 MIL/uL (ref 3.87–5.11)
RDW: 19.6 % — ABNORMAL HIGH (ref 11.5–15.5)
WBC Count: 8.8 10*3/uL (ref 4.0–10.5)
nRBC: 0 % (ref 0.0–0.2)

## 2023-08-15 LAB — IRON AND IRON BINDING CAPACITY (CC-WL,HP ONLY)
Iron: 49 ug/dL (ref 28–170)
Saturation Ratios: 13 % (ref 10.4–31.8)
TIBC: 374 ug/dL (ref 250–450)
UIBC: 325 ug/dL (ref 148–442)

## 2023-08-16 LAB — FERRITIN: Ferritin: 90 ng/mL (ref 11–307)

## 2023-08-17 ENCOUNTER — Inpatient Hospital Stay (HOSPITAL_BASED_OUTPATIENT_CLINIC_OR_DEPARTMENT_OTHER): Payer: Managed Care, Other (non HMO) | Admitting: Hematology and Oncology

## 2023-08-17 ENCOUNTER — Encounter: Payer: Managed Care, Other (non HMO) | Admitting: Family Medicine

## 2023-08-17 DIAGNOSIS — D509 Iron deficiency anemia, unspecified: Secondary | ICD-10-CM

## 2023-08-17 NOTE — Assessment & Plan Note (Signed)
Lab review: 05/02/2023: Creatinine 0.92 TSH 1.3, vitamin D 26, B12 401, iron saturation 6%, ferritin 11 08/15/2023: Hemoglobin 12.1, MCV 79.2, iron saturation 13%, ferritin 90  IV iron: January 2025  Lab review: There is no indication for IV iron therapy at this time. Recheck labs in 3 months and telephone visit 2 days later to discuss results.

## 2023-08-17 NOTE — Progress Notes (Signed)
HEMATOLOGY-ONCOLOGY TELEPHONE VISIT PROGRESS NOTE  I connected with our patient on 08/17/23 at  2:45 PM EST by telephone and verified that I am speaking with the correct person using two identifiers.  I discussed the limitations, risks, security and privacy concerns of performing an evaluation and management service by telephone and the availability of in person appointments.  I also discussed with the patient that there may be a patient responsible charge related to this service. The patient expressed understanding and agreed to proceed.   History of Present Illness: Follow-up of iron deficiency anemia  History of Present Illness   Diana Page is a 33 year old female who presents for follow-up after an iron infusion.  She received an iron infusion in January and has since experienced an overall improvement in her condition. However, she continues to experience heart palpitations and dizziness, particularly when transitioning from sitting to standing.  Her hemoglobin level is currently 12.1, with improved iron studies showing a ferritin level of 90 and an iron saturation of 13%. Despite these improvements, she still experiences symptoms typically associated with lower hemoglobin levels.        REVIEW OF SYSTEMS:   Constitutional: Denies fevers, chills or abnormal weight loss All other systems were reviewed with the patient and are negative. Observations/Objective:     Assessment Plan:  Iron deficiency anemia Lab review: 05/02/2023: Creatinine 0.92 TSH 1.3, vitamin D 26, B12 401, iron saturation 6%, ferritin 11 08/15/2023: Hemoglobin 12.1, MCV 79.2, iron saturation 13%, ferritin 90  IV iron: January 2025 Still dizzy when she stands up and heart palpitations  Lab review: There is no indication for IV iron therapy at this time. Recheck labs in 3 months and telephone visit 2 days later to discuss results.      I discussed the assessment and treatment plan with the patient. The  patient was provided an opportunity to ask questions and all were answered. The patient agreed with the plan and demonstrated an understanding of the instructions. The patient was advised to call back or seek an in-person evaluation if the symptoms worsen or if the condition fails to improve as anticipated.   I provided 20 minutes of non-face-to-face time during this encounter.  This includes time for charting and coordination of care   Tamsen Meek, MD

## 2023-08-19 ENCOUNTER — Telehealth: Payer: Self-pay | Admitting: Hematology and Oncology

## 2023-08-19 NOTE — Telephone Encounter (Signed)
 Scheduled appointments per 2/20 los. Patient is aware of the made appointments.

## 2023-09-05 ENCOUNTER — Other Ambulatory Visit: Payer: Self-pay | Admitting: Nurse Practitioner

## 2023-09-05 DIAGNOSIS — E559 Vitamin D deficiency, unspecified: Secondary | ICD-10-CM

## 2023-09-18 ENCOUNTER — Telehealth (HOSPITAL_COMMUNITY): Payer: Self-pay | Admitting: *Deleted

## 2023-09-18 DIAGNOSIS — F900 Attention-deficit hyperactivity disorder, predominantly inattentive type: Secondary | ICD-10-CM

## 2023-09-18 MED ORDER — METHYLPHENIDATE HCL 10 MG PO TABS: 10.0000 mg | ORAL_TABLET | Freq: Every day | ORAL | 0 refills | Status: DC

## 2023-09-18 NOTE — Telephone Encounter (Signed)
 Pt called to request refill of Ritalin 10 mg every day. Last e-scribed on 07/18/23, which was pt last visit as well. Pt has f/u scheduled for 10/18/23. Please review.

## 2023-09-18 NOTE — Telephone Encounter (Signed)
Send

## 2023-10-18 ENCOUNTER — Encounter (HOSPITAL_COMMUNITY): Payer: Self-pay | Admitting: Psychiatry

## 2023-10-18 ENCOUNTER — Telehealth (HOSPITAL_BASED_OUTPATIENT_CLINIC_OR_DEPARTMENT_OTHER): Payer: 59 | Admitting: Psychiatry

## 2023-10-18 VITALS — Wt 214.0 lb

## 2023-10-18 DIAGNOSIS — F429 Obsessive-compulsive disorder, unspecified: Secondary | ICD-10-CM | POA: Diagnosis not present

## 2023-10-18 DIAGNOSIS — F330 Major depressive disorder, recurrent, mild: Secondary | ICD-10-CM

## 2023-10-18 DIAGNOSIS — F900 Attention-deficit hyperactivity disorder, predominantly inattentive type: Secondary | ICD-10-CM

## 2023-10-18 MED ORDER — VORTIOXETINE HBR 10 MG PO TABS: 10.0000 mg | ORAL_TABLET | Freq: Every day | ORAL | 0 refills | Status: DC

## 2023-10-18 MED ORDER — OLANZAPINE-FLUOXETINE HCL 6-50 MG PO CAPS: 1.0000 | ORAL_CAPSULE | Freq: Every day | ORAL | 0 refills | Status: DC

## 2023-10-18 NOTE — Progress Notes (Signed)
 [facility] MD Virtual Progress Note   Patient Location: Home Provider Location: Office  I connect with patient by video and verified that I am speaking with correct person by using two identifiers. I discussed the limitations of evaluation and management by telemedicine and the availability of in person appointments. I also discussed with the patient that there may be a patient responsible charge related to this service. The patient expressed understanding and agreed to proceed.  Diana Page [id number] 33 y.o.  10/18/2023 10:44 AM  History of Present Illness:  Patient is evaluated by video session.  She is taking all her medication and doing very well.  She recently had a trip to [place] .  She is actively looking for a job since she graduated last December.  She takes the Ritalin  only as needed basis but all other medication she takes every day.  She feels the combination of Symbyax  and Trintellix  working very well.  She denies any major panic attack, crying spells or any feeling of hopelessness or worthlessness.  Since started getting iron  infusion and she had weight loss she feels much better.  She is on generic GLP-1 and had lost more than 30 pounds.  She is in a weight loss program through [facility].  She has only taken 1 Xanax  since the last visit when she was visiting [place] .  Her obsessive thoughts are under control.  She like to keep the current medication.  She had a good support from her husband.  She is in therapy every 2 weeks with Dr. Monica Ann.  Past Psychiatric History: H/O depression and OCD.  Tried Prozac  in [place] .  No h/o hallucination, paranoia, suicidal attempt or inpatient treatment.  Symbyax  worked. Tried Xanax  as needed. Luvox  caused sexual side effects and Wellbutrin  did not helped.Vyvanse  expensive.     Outpatient Encounter Medications as of 10/18/2023  Medication Sig   acetaZOLAMIDE  ER (DIAMOX ) 500 MG capsule Take 2 capsules (1,000 mg total)  by mouth at bedtime.   ALPRAZolam  (XANAX ) 0.5 MG tablet Take one tab as needed for severe panic attacks.   Biotin 2500 MCG CHEW Chew 1 tablet by mouth daily.   fexofenadine (ALLEGRA ODT) 30 MG disintegrating tablet Take 30 mg by mouth daily.   fluticasone (FLONASE) 50 MCG/ACT nasal spray Place 2 sprays into both nostrils daily.   Incontinence Supplies (DRAINABLE FECAL COLLECTOR) MISC by Does not apply route.   methylphenidate  (RITALIN ) 10 MG tablet Take 1 tablet (10 mg total) by mouth daily.   OLANZapine -FLUoxetine  (SYMBYAX ) 6-50 MG capsule Take 1 capsule by mouth daily.   ondansetron  (ZOFRAN -ODT) 4 MG disintegrating tablet Take 4 mg by mouth every 8 (eight) hours as needed for nausea or vomiting.   Ostomy Supplies MISC by Does not apply route.   pantoprazole  (PROTONIX ) 40 MG tablet Take 40 mg by mouth daily.   SEMAGLUTIDE-WEIGHT MANAGEMENT [place] Inject 5 mg into the skin once a week.   Vitamin D , Ergocalciferol , (DRISDOL ) 1.25 MG (50000 UNIT) CAPS capsule TAKE 1 CAPSULE (50,000 UNITS TOTAL) BY MOUTH EVERY 7 (SEVEN) DAYS   vortioxetine  HBr (TRINTELLIX ) 10 MG TABS tablet Take 1 tablet (10 mg total) by mouth daily.   No facility-administered encounter medications on file as of 10/18/2023.    Recent Results (from the past 2160 hours)  Iron  and Iron  Binding Capacity ([facility] only)     Status: None   Collection Time: 08/15/23  3:17 PM  Result Value Ref Range   Iron  49 28 - 170  ug/dL   TIBC 409 811 - 914 ug/dL   Saturation Ratios 13 10.4 - 31.8 %   UIBC 325 148 - 442 ug/dL    Comment: Performed at [facility], [address], [place], [place] [address]  Ferritin     Status: None   Collection Time: 08/15/23  3:17 PM  Result Value Ref Range   Ferritin 90 11 - 307 ng/mL    Comment: Performed at [facility], [address] [place], [place] [address]  CBC with Differential (Cancer Center Only)     Status: Abnormal   Collection Time: 08/15/23  3:17 PM   Result Value Ref Range   WBC Count 8.8 4.0 - 10.5 K/uL   RBC 4.91 3.87 - 5.11 MIL/uL   Hemoglobin 12.1 12.0 - 15.0 g/dL   HCT 86.5 78.4 - 69.6 %   MCV 79.2 (L) 80.0 - 100.0 fL   MCH 24.6 (L) 26.0 - 34.0 pg   MCHC 31.1 30.0 - 36.0 g/dL   RDW 29.5 (H) 28.4 - 13.2 %   Platelet Count 388 150 - 400 K/uL   nRBC 0.0 0.0 - 0.2 %   Neutrophils Relative % 64 %   Neutro Abs 5.7 1.7 - 7.7 K/uL   Lymphocytes Relative 27 %   Lymphs Abs 2.4 0.7 - 4.0 K/uL   Monocytes Relative 6 %   Monocytes Absolute 0.5 0.1 - 1.0 K/uL   Eosinophils Relative 1 %   Eosinophils Absolute 0.1 0.0 - 0.5 K/uL   Basophils Relative 1 %   Basophils Absolute 0.1 0.0 - 0.1 K/uL   Immature Granulocytes 1 %   Abs Immature Granulocytes 0.07 0.00 - 0.07 K/uL    Comment: Performed at [facility], [address], [place], [place] [address]     Psychiatric Specialty Exam: Physical Exam  Review of Systems  Weight 214 lb (97.1 kg).There is no height or weight on file to calculate BMI.  General Appearance: Casual  Eye Contact:  Good  Speech:  Clear and Coherent  Volume:  Normal  Mood:  Euthymic  Affect:  Appropriate  Thought Process:  Goal Directed  Orientation:  Full (Time, Place, and Person)  Thought Content:  WDL  Suicidal Thoughts:  No  Homicidal Thoughts:  No  Memory:  Immediate;   Good Recent;   Good Remote;   Good  Judgement:  Good  Insight:  Good  Psychomotor Activity:  Normal  Concentration:  Concentration: Good and Attention Span: Good  Recall:  Good  Fund of Knowledge:  Good  Language:  Good  Akathisia:  No  Handed:  Right  AIMS (if indicated):     Assets:  Communication Skills Desire for Improvement Housing Resilience Social Support Transportation  ADL's:  Intact  Cognition:  WNL  Sleep:  ok       05/02/2023   11:05 AM 03/24/2021    9:41 AM 09/28/2020   10:28 AM 06/04/2018    2:19 PM 09/07/2016    8:34 AM  Depression screen PHQ 2/9  Decreased Interest 1 1 1 2 1    Down, Depressed, Hopeless 1 1 1 2 3   PHQ - 2 Score 2 2 2 4 4   Altered sleeping 2 3 1 3 1   Tired, decreased energy 3 3 1 3 3   Change in appetite 1 0 0 2 3  Feeling bad or failure about yourself  0 1 0 3 2  Trouble concentrating 0 3 0 0  1  Moving slowly or fidgety/restless 0 0 0 0 1  Suicidal thoughts 0 0 0 0 0  PHQ-9 Score 8 12 4 15 15   Difficult doing work/chores Somewhat difficult Very difficult Somewhat difficult      Assessment/Plan: Obsessive-compulsive disorder, unspecified type - Plan: OLANZapine -FLUoxetine  (SYMBYAX ) 6-50 MG capsule  MDD (major depressive disorder), recurrent episode, mild (HCC) - Plan: vortioxetine  HBr (TRINTELLIX ) 10 MG TABS tablet, OLANZapine -FLUoxetine  (SYMBYAX ) 6-50 MG capsule  Attention deficit hyperactivity disorder (ADHD), predominantly inattentive type  Reviewed blood work results.  CBC improved from the past.  Since started iron  infusion and weight loss medicine she is reporting increased energy level.  Continue Symbyax  6/50 mg capsule daily, Trintellix  10 mg daily.  She has left lower Ritalin  and she will call us  when she need the new prescription.  She reported attention concentration is good.  Recommend to call us  back if she is any question or any concern.  Follow-up in 3 months.  She does not need a new prescription of Xanax  at this time.   Follow Up Instructions:     I discussed the assessment and treatment plan with the patient. The patient was provided an opportunity to ask questions and all were answered. The patient agreed with the plan and demonstrated an understanding of the instructions.   The patient was advised to call back or seek an in-person evaluation if the symptoms worsen or if the condition fails to improve as anticipated.    Collaboration of Care: Other provider involved in patient's care AEB notes are available in epic to review  Patient/Guardian was advised Release of Information must be obtained prior to any record release  in order to collaborate their care with an outside provider. Patient/Guardian was advised if they have not already done so to contact the registration department to sign all necessary forms in order for us  to release information regarding their care.   Consent: Patient/Guardian gives verbal consent for treatment and assignment of benefits for services provided during this visit. Patient/Guardian expressed understanding and agreed to proceed.     Total encounter time 21 minutes which includes face-to-face time, chart reviewed, care coordination, order entry and documentation during this encounter.   Note: This document was prepared by [employer] voice dictation technology and any errors that results from this process are unintentional.    Arturo Late, MD 10/18/2023

## 2023-10-26 ENCOUNTER — Telehealth (HOSPITAL_COMMUNITY): Payer: Self-pay | Admitting: *Deleted

## 2023-10-26 DIAGNOSIS — F900 Attention-deficit hyperactivity disorder, predominantly inattentive type: Secondary | ICD-10-CM

## 2023-10-26 MED ORDER — METHYLPHENIDATE HCL 10 MG PO TABS: 10.0000 mg | ORAL_TABLET | Freq: Every day | ORAL | 0 refills | Status: DC

## 2023-10-26 NOTE — Telephone Encounter (Signed)
 Done

## 2023-10-26 NOTE — Telephone Encounter (Signed)
 Pt called to request refill of the methylphenidate  10 mg every day. Last e-scribed on 09/18/23. Pt last visit was on 10/18/23 and she has a f/u scheduled for 01/22/24.

## 2023-11-06 ENCOUNTER — Encounter: Payer: Self-pay | Admitting: Neurology

## 2023-11-07 ENCOUNTER — Ambulatory Visit: Payer: Managed Care, Other (non HMO) | Admitting: Neurology

## 2023-11-07 ENCOUNTER — Telehealth (INDEPENDENT_AMBULATORY_CARE_PROVIDER_SITE_OTHER): Payer: Self-pay | Admitting: Neurology

## 2023-11-07 DIAGNOSIS — G932 Benign intracranial hypertension: Secondary | ICD-10-CM

## 2023-11-08 NOTE — Progress Notes (Signed)
 [id number]    Provider:  Dr Tresia Fruit Referring Provider: Susanna Epley, FNP Primary Care Physician:  Susanna Epley, FNP     CC: IDIOPATHIC INTRACRANIAL HYPERTENSION  11/08/2023: could not conect with patient today. She is having dizziness and she has lost 30 pounds which may be the diamox  needing to be decreased we will decrease to 1/2 of her dose at bedtime and reschedule appointment in 2 weeks  Interval history 12/09/2021: She stayed on 2 pills. No headaches. No vision changes. Doing very well. Fundoscopy stable. She has lost 30 pounds, if she can lose more we can consider continuing to decrease meds. Discussed. F/u with ophthalmology.  Patient complains of symptoms per HPI as well as the following symptoms: weight loss, intended . Pertinent negatives and positives per HPI. All others negative   Interval history 08/11/2021: Patient with a history of IDIOPATHIC INTRACRANIAL HYPERTENSION and abnormal brain MRI who has been doing well on diamox .She has reduced her diamox  to 2 at bedtime 1000mg  at bedtime and doing great, no vision problems, no headaches. Since she has lost 30 pounds and doing well on lower diamox  dose will keep her here. Can try to decrease to 1 pill 500mg  at bedtime and if no symptoms stay there or if more weight loss can try stopping altogether and just watching for symptoms. Talk to obgyn about diamox , they may have to perform more U/S testing for amniotic fluid but diamox  can be taken in pregnancy. Recommend following up with Dr. Candi Chafe in 3 -4 months especilly if you decie to stop the diamox  to see what happens. Discussed pregnancy and IDIOPATHIC INTRACRANIAL HYPERTENSION, losing more weight, risks od diamox  in pregnancy and monitoring closely while pregnant for any recurrence of iih or papilledema.   Interval history 01/23/2020: Patient with a history of IDIOPATHIC INTRACRANIAL HYPERTENSION and abnormal brain MRI who has been doing well on diamox  who is here  for worsening symptoms. She has been doing extremely well until recently, she has had several incidents with positional headache, when she gets up she hears the wooshing noise and pulsating/pounding, if she turns her head she has vision changes and vision changes, started 2 months ago, worsening, she has increased headaches, faint light in the corner of her eye and peripheral vision loss, she is having headaches at least 1/2 the month of 15 headache days a month, more pressure around the head all over. Unknown triggers, lasts a few hours. This is definitely a change, she takes the diamox , no weight gain.  Patient complains of symptoms per HPI as well as the following symptoms: headache, vision changes . Pertinent negatives and positives per HPI. All others negative   Interva history: Ajovy  works really well. She starts having migraines when it is time to take another shot. Follow up on IDIOPATHIC INTRACRANIAL HYPERTENSION (on diamox ) and chronic migraines on Ajovy . Works all month. Last 5 days she has breakthrough that can be significant, not always but can be. For month of the month no headaches at all. She doesn't want to change anything however, tylenol  doesn't help acutely.  We discussed and she is doing so well and having migraines infrequently when she does have them to make sure that she treats them right away, will prescribe her Maxalt  again she has never had a problem with triptans, will also give her Nurtec and advised her she can take multiple medications at once if needed.  I also reviewed Dr. Aubrey Leaf notes, he recently saw her October 01, 2019, visual  field full again today, OCT RNFL shows definite improvement stable with no edema, tolerating the Diamox  and symptoms are controlled, she does want to change anything until she loses a significant amount of weight.  Interval history 02/18/2019:  worsening headaches. She is now having 2 migraines days a week or more, more hedaches. Pounding, throbbing,  unilateral, nausea, light/sound sensitivity, worse with movement, sitting still in a dark room, bending over makes it pound worse.  This is been ongoing for the last 6 months.  She is worried because she is also having positional headaches, and exertional headaches when she sneezes, she has a history of idiopathic intracranial hypertension and this makes her worry for for other etiologies.  Waking up with headaches now.  She has a history of abnormal enhancement in the brain last was 2017 we will repeat an MRI.  I discussed with her that this may be progression of migraines, she is very compliant with her Diamox  and she has not gained weight.  In fact she is going to have bariatric surgery.  Given symptoms we will order MRI of the brain.  We will also start her on CGRP.  Interval history 11/07/2018: She was pursuing bariatric surgery and couldn;t get it due to a virus. She is going to contact it again. Motrin, tylenol  not helpng when she does get a headache. She does get occ migraines, needs to take something 3x a month. We discussed choices for acute management in detail. For her migraines will try a triptan. It is pounding, throbbing, unilateral with light and sound sensitivity. Do not recommend long-term opioids for pain management or acute migraines.  Interval history 10/24/2017: She is doing well. Still on the Diamox . Doing well. She will be going to Bariatric surgery referral which I recommend as weight loss is critical due to her condition and risk of permanent vision loss. IIH is associated with obesity and bariatric surgery is indicated in this case. No headaches, no vision changes, feel stable on medication. She is having dizziness at night which can be a symptom, needs bariatric surgery.   Interval history 01/23/2017: She has had 2 headaches in the last 6 months but it was in the setting of dehydration.  Her weight is fluctuating. She was 212 recently which is her highest. She saw Dr. Alvia Awkward for her  weight and she was placed on metformin  and had side effects. She is watching her diet, prefers to try differently. She is going back to school and quitting work, getting married in [place]. No headaches, vision is fine no vision changes, she sees Dr. Candi Chafe every 4 months, doing well on the diamox , visual fields are stable,  No more edema of the eyes. Discussed Bariatric surgery.     Interval history 07/25/2016: 33 year old here for follow up on IIH on acetazolamide  and obesity. She is getting married in November. No dizziness. Rare headaches while on the medication however she is on 1500mg  diamox  a day and having a difficult time losing weight which is critical in IIH. Her headaches are better,  1-2 since being last seen. No vision changes, no dizziness, no hearing changes. She is following weight watchers now and she saw a dietician but still having difficulty with weight loss.  She stopped eating meat and is worried about her B12 and iron . No snoring, no morning headache. She takes 1500mg  acetazolamide . She has missed 3 periods but 2 pregnancy tests are negative. They use condoms.  She is on 1500mg  diamox  daily and  I can decrease this dose if she can lose 10% of her body weight, we may be able to get her off of medication with even more weight loss. Will [facility]. She is having a difficult time with weight loss and I recommend [facility]. Discussed the role of weight in IIH and vision loss.    Interval history 03/21/2016:   33 year old female with papilledema, dizziness, positional headache, vision and hearing changes.  Opening pressure was 34.5, improved on diamox . Abnormal MRI with abnormal signal within the medial temporal lobes bilaterally predominantly involving the hippocampal structures. She was working hard, she was not drinking water and she was lifting a lot of things and she was running all day and she felt lightheaded  and dizzy and fatigued. She went and sat down for 5 minutes and she was ok. She was not drinking any water. She has one headache that lasted a whole day and a whole night it was terrible. She tried giving up caffeine. Likely caffeine withdrawal. Restared caffeine and fine. Vision has been fine. No snoring, no morning headaches. She takes 1500mg  acetazolamide .    Interval history 09/16/2015: She is having worsening blurry vision. This started within the last month. Things are not as crisp. No pressure. She is also hearing low level, wooshing in the ears. Worse when she is laying down and then stands. No headaches, no pressure. No side effects from the diamox . She has not been able to lose weight. Will keep her on the current dose and then if weight loss of 10% will decreasing the diamox . Need to repeat MRi of the brain due to new worsneing vision and hearing issues given previously seen Abnormal signal within the medial temporal lobes bilaterally predominantly involving the hippocampal structures.   MRI 09/26/2015: FINDINGS:  The brain parenchyma shows no significant abnormalities except subtle prominence and thickening of the right hippocampus and medial temporal lobe which is less prominent compared to the previous scan and is of unclear significance. No other structural lesion, tumor infarcts are noted. Diffusion weighted imaging is negative for acute ischemia. Susceptibility weighted imaging is negative for microhemorrhages. Subarachnoid spaces and ventricular system appear normal. Extraction brain structures up and normal. Calvarium shows no abnormalities. The pituitary gland and cerebellar tonsils appear normal. Flow voids of the large vessels are intact and circulation appear to be patent. Orbits and paranasal sinuses appear normal. Visualized portions of the cervical spine appears normal. Contrast images do not result in any abnormal areas of enhancement.         IMPRESSION:  Slightly abnormal MRI scan  of the brain showing subtle prominence of the right medial temporal lobe which is of unclear significance. Overall no significant change compared with previous MRI dated 01/16/2015.   Interval update: Opening pressure of LP was 34.5. 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 the thumping and pressure in her head have improved. The peripheral vision changes are gone, no headaches. She needs follow up with ophthalmology. She needs an ophthalmologist to follow in the area, will refer to Dr. Candi Chafe. She denies any current side effects from the diamox .    HPI:  Diana Page is a 33 y.o. female here as a referral from Dr. Avbuere for dizziness.  PMHx obesity. She was seen in the ED for acute onset headache and dizziness with decreased peripheral vision. She had an abnormal MRI and was called back to the ED for lumbar puncture which showed negative/normal CSF analysis. Unfortunately the LP did not include an opening pressure. She felt much better after the LP however. Currently if she stands up, she gets a thumping in the head. She gets stars in her right > Left peripheral vision. She gets pressure in the head all over. If she lays on her stomach, the pressure is worse and she can feel her heartbeat in her head. She went to optometry who diagnosed edema of the optic disks. Right eye has blurry vision which is persistent. She has had severe headaches. The dizziness has resolved after the LP. Symptoms felt much better after LP. Denies seizure-like events, staring, altered awareness. Symptoms are daily. No other associated focal neurologic deficits. No neck pain.    Reviewed notes, labs and imaging from outside physicians, which showed: MRi brain 12/27/2014: Personally reviewed images: 1. Abnormal signal  within the medial temporal lobes bilaterally predominantly involving the hippocampal structures. This is concerning for infectious process including herpes encephalitis. Other viral encephalitides can give a similar picture. Limbic encephalitis can look like this, but is typically associated with lung cancer. Alternatively, this could be related to seizure activity. The patient has no history of seizures. 2. Question abnormal signal within the anterior aspect of the right optic nerve, potentially related to same process.    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  Widening of the optic nerve sheath. This can be observed with intracranial hypertension, and is commonly seen with pseudotumor cerebri. 2.  The subtle signal changes in the mesial temporal lobes appear unchanged. Although initially worrisome for an infectious or limbic encephalopathy, the stability and the absence of specific clinical symptoms make this less likely.   If clinically indicated, consider follow-up evaluation or follow-up CSF analysis   Labs:    CSF: protein, glucose, diff, cell count normal TSH, B12, B1, HIV wnl CRP, ESR elevated CMP unremarkable CBC w/ anemia     Patient complains of symptoms per HPI as well as the following symptoms: weight loss . Pertinent negatives and positives per HPI. All others negative    Social History   Socioeconomic History   Marital status: Married    Spouse name: Ernestina Headland   Number of children: 0   Years of education: Ba   Highest education level: Not on file  Occupational History   Occupation: Merchandiser, retail     Comment: Hotel manager  Tobacco Use    Smoking status: Former    Current packs/day: 0.00    Types: Cigarettes, E-cigarettes    Quit date: 06/27/2009    Years since quitting: 14.3   Smokeless tobacco: Never  Vaping Use   Vaping status: Former  Substance and Sexual Activity   Alcohol use: Never    Comment: occasionally, "maybe once or twice a year"   Drug use: No   Sexual activity: Not Currently    Partners: Male    Birth control/protection: Condom  Other Topics Concern   Not on file  Social  History Narrative   Lives at home with husband, Ernestina Headland.   Caffeine use: maybe 5 big cups daily   Right handed   Social Drivers of Corporate investment banker Strain: Not on file  Food Insecurity: Not on file  Transportation Needs: Not on file  Physical Activity: Not on file  Stress: Not on file  Social Connections: Not on file  Intimate Partner Violence: Not on file    Family History  Problem Relation Age of Onset   OCD Mother    Diabetes Mother    Depression Mother    Anxiety disorder Mother    Obesity Mother    Obesity Father    Crohn's disease Father    OCD Sister    Depression Sister    Ulcerative colitis Sister    OCD Brother    Anxiety disorder Brother    Diverticulitis Brother     Past Medical History:  Diagnosis Date   Anemia    Anxiety    Back pain    Depression    Fatty liver    GERD (gastroesophageal reflux disease)    Headache 01/04/2015   Hx of cholecystectomy    Hypertension    Intercranial Hypertension   IBS (irritable bowel syndrome)    Joint pain    Obsessive compulsive disorder 11/2012   Ostomy nurse consultation    Pancreatitis    hx of    UC (ulcerative colitis) (HCC)     Past Surgical History:  Procedure Laterality Date   CHOLECYSTECTOMY     LAPAROSCOPIC PARTIAL PROTECTOMY     PERMANENT ILEOSTOMY     TOTAL COLECTOMY N/A     Current Outpatient Medications  Medication Sig Dispense Refill   acetaZOLAMIDE  ER (DIAMOX ) 500 MG capsule Take 2 capsules (1,000 mg total) by mouth at  bedtime. 60 capsule 5   ALPRAZolam  (XANAX ) 0.5 MG tablet Take one tab as needed for severe panic attacks. 10 tablet 0   Biotin 2500 MCG CHEW Chew 1 tablet by mouth daily.     fexofenadine (ALLEGRA ODT) 30 MG disintegrating tablet Take 30 mg by mouth daily.     fluticasone (FLONASE) 50 MCG/ACT nasal spray Place 2 sprays into both nostrils daily.     Incontinence Supplies (DRAINABLE FECAL COLLECTOR) MISC by Does not apply route.     methylphenidate  (RITALIN ) 10 MG tablet Take 1 tablet (10 mg total) by mouth daily. 30 tablet 0   OLANZapine -FLUoxetine  (SYMBYAX ) 6-50 MG capsule Take 1 capsule by mouth daily. 90 capsule 0   ondansetron  (ZOFRAN -ODT) 4 MG disintegrating tablet Take 4 mg by mouth every 8 (eight) hours as needed for nausea or vomiting.     Ostomy Supplies MISC by Does not apply route.     pantoprazole  (PROTONIX ) 40 MG tablet Take 40 mg by mouth daily.     SEMAGLUTIDE-WEIGHT MANAGEMENT  Inject 5 mg into the skin once a week.     Vitamin D , Ergocalciferol , (DRISDOL ) 1.25 MG (50000 UNIT) CAPS capsule TAKE 1 CAPSULE (50,000 UNITS TOTAL) BY MOUTH EVERY 7 (SEVEN) DAYS 12 capsule 1   vortioxetine  HBr (TRINTELLIX ) 10 MG TABS tablet Take 1 tablet (10 mg total) by mouth daily. 90 tablet 0   No current facility-administered medications for this visit.    Allergies as of 11/07/2023 - Review Complete 10/18/2023  Allergen Reaction Noted   Nsaids Other (See Comments) 07/17/2014   Tolmetin Other (See Comments) 10/14/2014    Vitals: There were no vitals taken for this visit. Last  Weight:  Wt Readings from Last 1 Encounters:  06/15/23 241 lb (109.3 kg)   Last Height:   Ht Readings from Last 1 Encounters:  06/15/23 5' 2.4" (1.585 m)   Exam: NAD, pleasant                  Speech:    Speech is normal; fluent and spontaneous with normal comprehension.  Cognition:    The patient is oriented to person, place, and time;     recent and remote memory intact;     language fluent;    Cranial  Nerves:    The pupils are equal, round, and reactive to light. Fundi stable, no changes, no papilledema. Sligt blurirng margins, stable. Trigeminal sensation is intact and the muscles of mastication are normal. The face is symmetric. The palate elevates in the midline. Hearing intact. Voice is normal. Shoulder shrug is normal. The tongue has normal motion without fasciculations.   Coordination:  No dysmetria  Motor Observation:    No asymmetry, no atrophy, and no involuntary movements noted. Tone:    Normal muscle tone.     Strength:    Strength is V/V in the upper and lower limbs.      Sensation: intact to LT   Assessment/Plan:  74 old female with papilledema, dizziness, positional headache, vision and hearing changes, obesity.  Opening pressure was 34.5, improved on diamox . Abnormal MRI with abnormal signal within the medial temporal lobes bilaterally predominantly involving the hippocampal structures, stable on multiple repeat MRIs.  -She has reduced her diamox  to 2 at bedtime 1000mg  at bedtime(from 2000mg  a day) since she lost weight and doing great, no vision problems, no headaches. Since she has lost 30 pounds and doing well on lower diamox  dose will keep her here. If she can lose more weight we can try to decrease to 1 pill 500mg  at bedtime and if no symptoms stay there or if more weight loss can try stopping altogether and just watching for symptoms. - Talk to obgyn about diamox , they may have to perform more U/S testing for amniotic fluid but diamox  can be taken in pregnancy.   -Recommend following up with me in 12 months and Dr. Candi Chafe in 6 months especilly if you decie to stop the diamox  to see what happens.  No orders of the defined types were placed in this encounter.    PRIOR Assessment and Plans prior to 2021: In the past, abnormal MRI of the orbits with and without contrast showing widening of the optic nerve sheaths and mild elevation of the optic nerve heads, findings  are most consistent with idiopathic intracranial hypertension (pseudotumor cerebri). Repeat MRi showed subtle signal changes in the mesial temporal lobes unchanged, although initially worrisome for an infectious or limbic encephalopathy, the stability and the absence of specific clinical symptoms make this less likely.  -For her migraine she is doing well on the Ajovy  works mostly all month but she does have breakthrough when is time for her injection.  We did discuss do not get pregnant on this need to be off of it for 5 months if considering pregnancy  -Acutely since she has migraines infrequently she can take Maxalt , Nurtec, Tylenol  acutely she can even take all 3 together for severe headache.   - Ajovy  for migraines: tried topamax, diamox , amitriptyline   Discussed weight loss gain as is critical and discussed weight in the pathophysiology of IIH; refer to Bariatric surgery, bariatric surgery is indicated due to her condition  Continue diamox . Discussed again teratogenicity, she is getting married next year, advised weight loss before children, although diamox  is generally considered compatible with pregnancy no medication is entirely safe. Romana Clover advised we can decrease the medication if she loses 10% of body weight and we may be able to entirely stop the diamox  with a healthy weight.   - Should proceed to ED if vision significantly worsens - Follow ups with Dr. Candi Chafe have been very positive, she sees him regularly - She is in the process of bariatric surgery - declined it due to her GI problems - maxalt  acutely. For migraines, continue - Migraines: conitnue ajovy    Aldona Amel, MD  [facility] [address] [place], [place] [address]  Phone [phone] Fax [phone]  I spent 20 minutes of face-to-face and non-face-to-face time with patient on the  1. IIH (idiopathic intracranial hypertension)      diagnosis.  This included previsit chart  review, lab review, study review, order entry, electronic health record documentation, patient education on the different diagnostic and therapeutic options, counseling and coordination of care, risks and benefits of management, compliance, or risk factor reduction

## 2023-11-13 ENCOUNTER — Other Ambulatory Visit: Payer: Managed Care, Other (non HMO)

## 2023-11-16 ENCOUNTER — Ambulatory Visit: Admitting: Family Medicine

## 2023-11-16 ENCOUNTER — Ambulatory Visit: Attending: Family Medicine

## 2023-11-16 ENCOUNTER — Encounter: Payer: Self-pay | Admitting: Family Medicine

## 2023-11-16 ENCOUNTER — Encounter: Payer: Self-pay | Admitting: Hematology and Oncology

## 2023-11-16 ENCOUNTER — Telehealth: Payer: Managed Care, Other (non HMO) | Admitting: Hematology and Oncology

## 2023-11-16 VITALS — BP 108/70 | HR 105 | Temp 97.5°F | Ht 62.0 in | Wt 214.6 lb

## 2023-11-16 DIAGNOSIS — E66812 Obesity, class 2: Secondary | ICD-10-CM | POA: Diagnosis not present

## 2023-11-16 DIAGNOSIS — Z6839 Body mass index (BMI) 39.0-39.9, adult: Secondary | ICD-10-CM

## 2023-11-16 DIAGNOSIS — E8882 Obesity due to disruption of MC4R pathway: Secondary | ICD-10-CM | POA: Diagnosis not present

## 2023-11-16 DIAGNOSIS — R002 Palpitations: Secondary | ICD-10-CM | POA: Diagnosis not present

## 2023-11-16 DIAGNOSIS — Z152 Genetic susceptibility to obesity: Secondary | ICD-10-CM

## 2023-11-16 NOTE — Progress Notes (Signed)
 I,[name], CMA,acting as a Neurosurgeon for [employer], NP.,have documented all relevant documentation on the behalf of Melodie Spry, NP,as directed by  Melodie Spry, NP while in the presence of Melodie Spry, NP.  Subjective:  Patient ID: Diana Page , female    DOB: 11/06/1990 , 33 y.o.   MRN: [id number]  Chief Complaint  Patient presents with   Leg Pain    Patient presents today with a lump on her upper right leg. Patient also been experiencing dizziness when she goes from lying down to sitting up or sitting to standing. Only is better when she is laying down flat. Denies headaches, chest pains and sob.    HPI  Patient is a 33 year old female who presents for palpitations that has been ongoing since December. She states that she feels it is getting worse and would like to see a cardiologist. She states that she has other symptoms such as dizziness/lightheadedness that is not caused by any event. Will get orthostatic BP and order the zio patch monitor for 14 days.  Patient agrees and voices understanding.     Past Medical History:  Diagnosis Date   Anemia    Anxiety    Back pain    Depression    Fatty liver    GERD (gastroesophageal reflux disease)    Headache 01/04/2015   Hx of cholecystectomy    Hypertension    Intercranial Hypertension   IBS (irritable bowel syndrome)    Joint pain    Obsessive compulsive disorder 11/2012   Ostomy nurse consultation    Pancreatitis    hx of    UC (ulcerative colitis) (HCC)      Family History  Problem Relation Age of Onset   OCD Mother    Diabetes Mother    Depression Mother    Anxiety disorder Mother    Obesity Mother    Obesity Father    Crohn's disease Father    OCD Sister    Depression Sister    Ulcerative colitis Sister    OCD Brother    Anxiety disorder Brother    Diverticulitis Brother      Current Outpatient Medications:    acetaZOLAMIDE  ER (DIAMOX ) 500 MG capsule, Take 2 capsules (1,000 mg total) by mouth  at bedtime. (Patient taking differently: Take 500 mg by mouth at bedtime.), Disp: 60 capsule, Rfl: 5   ALPRAZolam  (XANAX ) 0.5 MG tablet, Take one tab as needed for severe panic attacks., Disp: 10 tablet, Rfl: 0   Biotin 2500 MCG CHEW, Chew 1 tablet by mouth daily., Disp: , Rfl:    fexofenadine (ALLEGRA ODT) 30 MG disintegrating tablet, Take 30 mg by mouth daily., Disp: , Rfl:    fluticasone (FLONASE) 50 MCG/ACT nasal spray, Place 2 sprays into both nostrils daily., Disp: , Rfl:    Incontinence Supplies (DRAINABLE FECAL COLLECTOR) MISC, by Does not apply route., Disp: , Rfl:    methylphenidate  (RITALIN ) 10 MG tablet, Take 1 tablet (10 mg total) by mouth daily., Disp: 30 tablet, Rfl: 0   OLANZapine -FLUoxetine  (SYMBYAX ) 6-50 MG capsule, Take 1 capsule by mouth daily., Disp: 90 capsule, Rfl: 0   ondansetron  (ZOFRAN -ODT) 4 MG disintegrating tablet, Take 4 mg by mouth every 8 (eight) hours as needed for nausea or vomiting., Disp: , Rfl:    Ostomy Supplies MISC, by Does not apply route., Disp: , Rfl:    pantoprazole  (PROTONIX ) 40 MG tablet, Take 40 mg by mouth daily., Disp: , Rfl:  SEMAGLUTIDE-WEIGHT MANAGEMENT [place], Inject 5 mg into the skin once a week., Disp: , Rfl:    vortioxetine  HBr (TRINTELLIX ) 10 MG TABS tablet, Take 1 tablet (10 mg total) by mouth daily., Disp: 90 tablet, Rfl: 0   Vitamin D , Ergocalciferol , (DRISDOL ) 1.25 MG (50000 UNIT) CAPS capsule, TAKE 1 CAPSULE (50,000 UNITS TOTAL) BY MOUTH EVERY 7 (SEVEN) DAYS (Patient not taking: Reported on 11/16/2023), Disp: 12 capsule, Rfl: 1   Allergies  Allergen Reactions   Nsaids Other (See Comments)    Cannot have because of her Ulcerative Colitis   Tolmetin Other (See Comments)    Cannot have because of her Ulcerative Colitis     Review of Systems  Constitutional: Negative.   HENT: Negative.    Respiratory: Negative.    Cardiovascular:  Positive for palpitations.  Neurological:  Negative for dizziness, light-headedness and headaches.   Psychiatric/Behavioral: Negative.       Today's Vitals   11/16/23 1440 11/16/23 1632 11/16/23 1633  BP: 108/72 110/74 108/70  Pulse: 90 89 (!) 105  Temp: (!) 97.5 F (36.4 C)    SpO2: 98%    Weight: 214 lb 9.6 oz (97.3 kg)    Height: 5\' 2"  (1.575 m)     Body mass index is 39.25 kg/m.  Wt Readings from Last 3 Encounters:  11/16/23 214 lb 9.6 oz (97.3 kg)  06/15/23 241 lb (109.3 kg)  05/02/23 232 lb (105.2 kg)    The ASCVD Risk score (Arnett DK, et al., 2019) failed to calculate for the following reasons:   The 2019 ASCVD risk score is only valid for ages 35 to 21  Objective:  Physical Exam HENT:     Head: Normocephalic.  Cardiovascular:     Rate and Rhythm: Normal rate.  Pulmonary:     Effort: Pulmonary effort is normal.     Breath sounds: Normal breath sounds.  Skin:    General: Skin is warm and dry.  Neurological:     General: No focal deficit present.     Mental Status: She is alert and oriented to person, place, and time.  Psychiatric:        Mood and Affect: Mood normal.         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 Advised to aim for at least 150 minutes of exercise per week.      Return if symptoms worsen or fail to improve.  Patient was given opportunity to ask questions. Patient verbalized understanding of the plan and was able to repeat key elements of the plan. All questions were answered to their satisfaction.    I, Melodie Spry, NP, have reviewed all documentation for this visit. The documentation on 11/27/2023 for the exam, diagnosis, procedures, and orders are all accurate and complete.     IF YOU HAVE BEEN  REFERRED TO A SPECIALIST, IT MAY TAKE 1-2 WEEKS TO SCHEDULE/PROCESS THE REFERRAL. IF YOU HAVE NOT HEARD FROM US /SPECIALIST IN TWO WEEKS, PLEASE GIVE US  A CALL AT [phone].

## 2023-11-17 NOTE — Progress Notes (Unsigned)
 EP to read.

## 2023-11-21 ENCOUNTER — Inpatient Hospital Stay: Attending: Hematology and Oncology

## 2023-11-21 DIAGNOSIS — D509 Iron deficiency anemia, unspecified: Secondary | ICD-10-CM | POA: Diagnosis present

## 2023-11-21 LAB — CBC WITH DIFFERENTIAL (CANCER CENTER ONLY)
Abs Immature Granulocytes: 0.06 10*3/uL (ref 0.00–0.07)
Basophils Absolute: 0 10*3/uL (ref 0.0–0.1)
Basophils Relative: 0 %
Eosinophils Absolute: 0.1 10*3/uL (ref 0.0–0.5)
Eosinophils Relative: 2 %
HCT: 39 % (ref 36.0–46.0)
Hemoglobin: 12.9 g/dL (ref 12.0–15.0)
Immature Granulocytes: 1 %
Lymphocytes Relative: 35 %
Lymphs Abs: 2.8 10*3/uL (ref 0.7–4.0)
MCH: 27.2 pg (ref 26.0–34.0)
MCHC: 33.1 g/dL (ref 30.0–36.0)
MCV: 82.3 fL (ref 80.0–100.0)
Monocytes Absolute: 0.4 10*3/uL (ref 0.1–1.0)
Monocytes Relative: 5 %
Neutro Abs: 4.6 10*3/uL (ref 1.7–7.7)
Neutrophils Relative %: 57 %
Platelet Count: 352 10*3/uL (ref 150–400)
RBC: 4.74 MIL/uL (ref 3.87–5.11)
RDW: 13.6 % (ref 11.5–15.5)
WBC Count: 8 10*3/uL (ref 4.0–10.5)
nRBC: 0 % (ref 0.0–0.2)

## 2023-11-21 LAB — FERRITIN: Ferritin: 55 ng/mL (ref 11–307)

## 2023-11-21 LAB — IRON AND IRON BINDING CAPACITY (CC-WL,HP ONLY)
Iron: 49 ug/dL (ref 28–170)
Saturation Ratios: 12 % (ref 10.4–31.8)
TIBC: 395 ug/dL (ref 250–450)
UIBC: 346 ug/dL (ref 148–442)

## 2023-11-23 ENCOUNTER — Inpatient Hospital Stay: Admitting: Hematology and Oncology

## 2023-11-23 DIAGNOSIS — D509 Iron deficiency anemia, unspecified: Secondary | ICD-10-CM

## 2023-11-23 NOTE — Progress Notes (Signed)
 HEMATOLOGY-ONCOLOGY TELEPHONE VISIT PROGRESS NOTE  I connected with our patient on 11/23/23 at  3:00 PM EDT by telephone and verified that I am speaking with the correct person using two identifiers.  I discussed the limitations, risks, security and privacy concerns of performing an evaluation and management service by telephone and the availability of in person appointments.  I also discussed with the patient that there may be a patient responsible charge related to this service. The patient expressed understanding and agreed to proceed.   History of Present Illness: Follow-up of iron  deficiency anemia History of Present Illness Diana Page is a 33 year old female with iron  deficiency anemia who presents for follow-up after iron  infusion.  She reports a noticeable improvement in overall well-being following the iron  infusion in January. Her hemoglobin level is 12.9, and red blood cell size has normalized. Ferritin level is 55, decreased from 90 post-infusion. She has not required additional iron  supplementation since the infusion.    REVIEW OF SYSTEMS:   Constitutional: Denies fevers, chills or abnormal weight loss All other systems were reviewed with the patient and are negative. Observations/Objective:     Assessment Plan:  Iron  deficiency anemia Lab review: 05/02/2023: Creatinine 0.92 TSH 1.3, vitamin D  26, B12 401, iron  saturation 6%, ferritin 11 08/15/2023: Hemoglobin 12.1, MCV 79.2, iron  saturation 13%, ferritin 90 11/21/2023: Hemoglobin 12.9, MCV 82.3, iron  saturation 12%, ferritin 55   IV iron : January 2025    Lab review: There is no indication for IV iron  therapy at this time. Recheck labs in 6 months and telephone visit 2 days later to discuss results.     I discussed the assessment and treatment plan with the patient. The patient was provided an opportunity to ask questions and all were answered. The patient agreed with the plan and demonstrated an understanding of the  instructions. The patient was advised to call back or seek an in-person evaluation if the symptoms worsen or if the condition fails to improve as anticipated.   I provided 20 minutes of non-face-to-face time during this encounter.  This includes time for charting and coordination of care   Margert Sheerer, MD

## 2023-11-23 NOTE — Assessment & Plan Note (Signed)
 Lab review: 05/02/2023: Creatinine 0.92 TSH 1.3, vitamin D  26, B12 401, iron  saturation 6%, ferritin 11 08/15/2023: Hemoglobin 12.1, MCV 79.2, iron  saturation 13%, ferritin 90 11/21/2023: Hemoglobin 12.9, MCV 82.3, iron  saturation 12%, ferritin 55   IV iron : January 2025    Lab review: There is no indication for IV iron  therapy at this time. Recheck labs in 6 months and telephone visit 2 days later to discuss results.

## 2023-11-27 DIAGNOSIS — Z152 Genetic susceptibility to obesity: Secondary | ICD-10-CM | POA: Insufficient documentation

## 2023-11-27 DIAGNOSIS — R002 Palpitations: Secondary | ICD-10-CM | POA: Insufficient documentation

## 2023-11-27 NOTE — Assessment & Plan Note (Addendum)
 Zio patch ordered for 14 days

## 2023-11-27 NOTE — Consults (Signed)
 Patient seen in office, states that she was told to come in so that ostomy supplies prescription can be filled. States that she is having no trouble with her ileostomy. Viewed ileostomy in one piece convex pouch, pink and slightly budded. Patient declined need for supplies to take home, states that she has supplies at home. No other needs identified.   Gabriel Elbe, MSN RN CWOCN Wound, Ostomy, Continence Consult Service

## 2023-11-27 NOTE — Assessment & Plan Note (Signed)
 She is encouraged to strive for BMI less than 30 to decrease cardiac risk. Advised to aim for at least 150 minutes of exercise per week.

## 2023-11-28 NOTE — Progress Notes (Signed)
 LATE ENTRY  Patient was seen in the office yesterday by inpatient WOCN, Almarie Elbe for a follow up visit. Please refer to clinical support note under the notes tab.  Patient needs a refill on ostomy supplies signed by Dr. Timothy Sadiq. This visit will be consider as an encounter visit for insurance purposes. I discussed this with our Nurse Coordinator, Jon Salt and she agreed with the acceptance of visit being valid for provider's signature.  Note has been sent to provider for acknowledgement.

## 2023-12-01 LAB — LIPID PANEL
Cholesterol: 180 (ref 0–200)
HDL: 28 — AB (ref 35–70)
LDL Cholesterol: 114
Triglycerides: 189 — AB (ref 40–160)

## 2023-12-01 LAB — HEMOGLOBIN A1C: Hemoglobin A1C: 5.3

## 2023-12-01 LAB — BASIC METABOLIC PANEL WITH GFR: Glucose: 93

## 2023-12-09 ENCOUNTER — Other Ambulatory Visit: Payer: Self-pay | Admitting: Neurology

## 2023-12-11 ENCOUNTER — Telehealth (HOSPITAL_COMMUNITY): Payer: Self-pay | Admitting: *Deleted

## 2023-12-11 DIAGNOSIS — F900 Attention-deficit hyperactivity disorder, predominantly inattentive type: Secondary | ICD-10-CM

## 2023-12-11 MED ORDER — METHYLPHENIDATE HCL 10 MG PO TABS: 10.0000 mg | ORAL_TABLET | Freq: Every day | ORAL | 0 refills | Status: AC

## 2023-12-11 NOTE — Telephone Encounter (Signed)
 Done

## 2023-12-11 NOTE — Telephone Encounter (Signed)
 Pt called with request refill of the methylphenidate  10 mg every day. Medication was last e-scribed on 10/26/23. Pt was seen on 10/18/23 and has f/u scheduled for 01/22/24. [facility] [phone]

## 2023-12-17 DIAGNOSIS — R002 Palpitations: Secondary | ICD-10-CM

## 2023-12-20 ENCOUNTER — Other Ambulatory Visit (HOSPITAL_COMMUNITY): Payer: Self-pay | Admitting: *Deleted

## 2023-12-20 DIAGNOSIS — F330 Major depressive disorder, recurrent, mild: Secondary | ICD-10-CM

## 2023-12-20 MED ORDER — VORTIOXETINE HBR 10 MG PO TABS: 10.0000 mg | ORAL_TABLET | Freq: Every day | ORAL | 1 refills | Status: DC

## 2024-01-16 LAB — LAB REPORT - SCANNED
HM HIV Screening: NEGATIVE
HM Hepatitis Screen: NEGATIVE
TSH: 0.79 (ref 0.41–5.90)

## 2024-01-22 ENCOUNTER — Telehealth (HOSPITAL_BASED_OUTPATIENT_CLINIC_OR_DEPARTMENT_OTHER): Admitting: Psychiatry

## 2024-01-22 ENCOUNTER — Encounter: Payer: Self-pay | Admitting: Hematology and Oncology

## 2024-01-22 ENCOUNTER — Encounter (HOSPITAL_COMMUNITY): Payer: Self-pay | Admitting: Psychiatry

## 2024-01-22 VITALS — Wt 214.0 lb

## 2024-01-22 DIAGNOSIS — F429 Obsessive-compulsive disorder, unspecified: Secondary | ICD-10-CM | POA: Diagnosis not present

## 2024-01-22 DIAGNOSIS — F900 Attention-deficit hyperactivity disorder, predominantly inattentive type: Secondary | ICD-10-CM

## 2024-01-22 DIAGNOSIS — F330 Major depressive disorder, recurrent, mild: Secondary | ICD-10-CM

## 2024-01-22 DIAGNOSIS — F410 Panic disorder [episodic paroxysmal anxiety] without agoraphobia: Secondary | ICD-10-CM | POA: Diagnosis not present

## 2024-01-22 MED ORDER — OLANZAPINE-FLUOXETINE HCL 6-50 MG PO CAPS: 1.0000 | ORAL_CAPSULE | Freq: Every day | ORAL | 0 refills | Status: DC

## 2024-01-22 MED ORDER — VORTIOXETINE HBR 10 MG PO TABS: 10.0000 mg | ORAL_TABLET | Freq: Every day | ORAL | 2 refills | Status: DC

## 2024-01-22 NOTE — Progress Notes (Signed)
 [facility] MD Virtual Progress Note   Patient Location: Home Provider Location: Home Office  I connect with patient by video and verified that I am speaking with correct person by using two identifiers. I discussed the limitations of evaluation and management by telemedicine and the availability of in person appointments. I also discussed with the patient that there may be a patient responsible charge related to this service. The patient expressed understanding and agreed to proceed.  Diana Page [id number] 33 y.o.  01/22/2024 10:48 AM  History of Present Illness:  Patient is evaluated by video session.  She is excited as started at IVF and in few days she has day of reveal.  She is seeing provider Dr Catheryn Book at [facility] and very close in touch about her psychotropic medication and diagnosis.  Patient was told that she cannot take the Xanax  and methylphenidate  but she can continue Trintellix  and Symbyax .  She reported her anxiety is under control and she does not have any major panic attack.  She has not taken Xanax  in a while.  Her OCD and depression is under control.  She has a good support from her husband.  Patient also stopped GLP-1 and admitted increased appetite but weight is stable.  Patient told in August implantation will occur after necessary workup done.  She endorsed taking higher dose of hormones and that make some time mood lability but so far manageable.  She is in therapy with Dr. Dario.  She denies any crying spells or any feeling of hopelessness or worthlessness patient denies any suicidal thoughts.  She reported her PCP is under control but realize it may get worse since she is going to stop the Ritalin .  She discussed about possible withdrawal side effects after stopping the Ritalin .  She denies drinking or using any illegal substances.  Past Psychiatric History: H/O depression and OCD.  Tried Prozac  in [place] .  No h/o  hallucination, paranoia, suicidal attempt or inpatient treatment.  Symbyax  worked. Tried Xanax  as needed. Luvox  caused sexual side effects and Wellbutrin  did not helped.Vyvanse  expensive.    Past Medical History:  Diagnosis Date   Anemia    Anxiety    Back pain    Depression    Fatty liver    GERD (gastroesophageal reflux disease)    Headache 01/04/2015   Hx of cholecystectomy    Hypertension    Intercranial Hypertension   IBS (irritable bowel syndrome)    Joint pain    Obsessive compulsive disorder 11/2012   Ostomy nurse consultation    Pancreatitis    hx of    UC (ulcerative colitis) [facility])     Outpatient Encounter Medications as of 01/22/2024  Medication Sig   acetaZOLAMIDE  ER (DIAMOX ) 500 MG capsule TAKE 2 CAPSULES (1,000 MG TOTAL) BY MOUTH AT BEDTIME.   ALPRAZolam  (XANAX ) 0.5 MG tablet Take one tab as needed for severe panic attacks.   Biotin 2500 MCG CHEW Chew 1 tablet by mouth daily.   fexofenadine (ALLEGRA ODT) 30 MG disintegrating tablet Take 30 mg by mouth daily.   fluticasone (FLONASE) 50 MCG/ACT nasal spray Place 2 sprays into both nostrils daily.   Incontinence Supplies (DRAINABLE FECAL COLLECTOR) MISC by Does not apply route.   methylphenidate  (RITALIN ) 10 MG tablet Take 1 tablet (10 mg total) by mouth daily.   OLANZapine -FLUoxetine  (SYMBYAX ) 6-50 MG capsule Take 1 capsule by mouth daily.   ondansetron  (ZOFRAN -ODT) 4 MG disintegrating tablet Take 4 mg by mouth every 8 (  eight) hours as needed for nausea or vomiting.   Ostomy Supplies MISC by Does not apply route.   pantoprazole  (PROTONIX ) 40 MG tablet Take 40 mg by mouth daily.   SEMAGLUTIDE-WEIGHT MANAGEMENT  Inject 5 mg into the skin once a week.   Vitamin D , Ergocalciferol , (DRISDOL ) 1.25 MG (50000 UNIT) CAPS capsule TAKE 1 CAPSULE (50,000 UNITS TOTAL) BY MOUTH EVERY 7 (SEVEN) DAYS (Patient not taking: Reported on 11/16/2023)   vortioxetine  HBr (TRINTELLIX ) 10 MG TABS tablet Take 1 tablet (10 mg total) by mouth  daily.   No facility-administered encounter medications on file as of 01/22/2024.    Recent Results (from the past 2160 hours)  Iron  and Iron  Binding Capacity ([facility] only)     Status: None   Collection Time: 11/21/23 10:16 AM  Result Value Ref Range   Iron  49 28 - 170 ug/dL   TIBC 604 749 - 549 ug/dL   Saturation Ratios 12 10.4 - 31.8 %   UIBC 346 148 - 442 ug/dL    Comment: Performed at [facility], [address], [place], [place] [address]  Ferritin     Status: None   Collection Time: 11/21/23 10:16 AM  Result Value Ref Range   Ferritin 55 11 - 307 ng/mL    Comment: Performed at [facility], [address] [place], [place] [address]  CBC with Differential (Cancer Center Only)     Status: None   Collection Time: 11/21/23 10:16 AM  Result Value Ref Range   WBC Count 8.0 4.0 - 10.5 K/uL   RBC 4.74 3.87 - 5.11 MIL/uL   Hemoglobin 12.9 12.0 - 15.0 g/dL   HCT 60.9 63.9 - 53.9 %   MCV 82.3 80.0 - 100.0 fL   MCH 27.2 26.0 - 34.0 pg   MCHC 33.1 30.0 - 36.0 g/dL   RDW 86.3 88.4 - 84.4 %   Platelet Count 352 150 - 400 K/uL   nRBC 0.0 0.0 - 0.2 %   Neutrophils Relative % 57 %   Neutro Abs 4.6 1.7 - 7.7 K/uL   Lymphocytes Relative 35 %   Lymphs Abs 2.8 0.7 - 4.0 K/uL   Monocytes Relative 5 %   Monocytes Absolute 0.4 0.1 - 1.0 K/uL   Eosinophils Relative 2 %   Eosinophils Absolute 0.1 0.0 - 0.5 K/uL   Basophils Relative 0 %   Basophils Absolute 0.0 0.0 - 0.1 K/uL   Immature Granulocytes 1 %   Abs Immature Granulocytes 0.06 0.00 - 0.07 K/uL    Comment: Performed at [facility], [address], [place], [place] [address]     Psychiatric Specialty Exam: Physical Exam  Review of Systems  Weight 214 lb (97.1 kg).There is no height or weight on file to calculate BMI.  General Appearance: Casual  Eye Contact:  Good  Speech:  Clear and Coherent  Volume:  Normal  Mood:  Euthymic  Affect:  Congruent   Thought Process:  Goal Directed  Orientation:  Full (Time, Place, and Person)  Thought Content:  Logical  Suicidal Thoughts:  No  Homicidal Thoughts:  No  Memory:  Immediate;   Good Recent;   Good Remote;   Good  Judgement:  Good  Insight:  Good  Psychomotor Activity:  Normal  Concentration:  Concentration: Good and Attention Span: Good  Recall:  Good  Fund of Knowledge:  Good  Language:  Good  Akathisia:  No  Handed:  Right  AIMS (  if indicated):     Assets:  Communication Skills Desire for Improvement Financial Resources/Insurance Housing Social Support Transportation  ADL's:  Intact  Cognition:  WNL  Sleep:  ok       11/16/2023    2:39 PM 05/02/2023   11:05 AM 03/24/2021    9:41 AM 09/28/2020   10:28 AM 06/04/2018    2:19 PM  Depression screen PHQ 2/9  Decreased Interest 0 1 1 1 2   Down, Depressed, Hopeless 0 1 1 1 2   PHQ - 2 Score 0 2 2 2 4   Altered sleeping 0 2 3 1 3   Tired, decreased energy 0 3 3 1 3   Change in appetite 0 1 0 0 2  Feeling bad or failure about yourself  0 0 1 0 3  Trouble concentrating 0 0 3 0 0  Moving slowly or fidgety/restless 0 0 0 0 0  Suicidal thoughts 0 0 0 0 0  PHQ-9 Score 0 8 12 4 15   Difficult doing work/chores Not difficult at all Somewhat difficult Very difficult Somewhat difficult     Assessment/Plan: MDD (major depressive disorder), recurrent episode, mild (HCC) - Plan: OLANZapine -FLUoxetine  (SYMBYAX ) 6-50 MG capsule, vortioxetine  HBr (TRINTELLIX ) 10 MG TABS tablet  Obsessive-compulsive disorder, unspecified type - Plan: OLANZapine -FLUoxetine  (SYMBYAX ) 6-50 MG capsule, vortioxetine  HBr (TRINTELLIX ) 10 MG TABS tablet  Attention deficit hyperactivity disorder (ADHD), predominantly inattentive type - Plan: vortioxetine  HBr (TRINTELLIX ) 10 MG TABS tablet  Panic attack - Plan: vortioxetine  HBr (TRINTELLIX ) 10 MG TABS tablet  Patient is seeing provider at [facility] and process is going smoothly.  She is on high level of  hormones.  She was told not to take any GLP-1, Xanax  and Ritalin .  She took her last pill today of Ritalin .  We discussed possibility of withdrawals and lack of focus and attention and she is aware about it.  She has a good support from her husband and she also see therapist every 2 weeks.  I recommend have her provider contact us  if they need further information, questions.  Patient agreed with the plan.  For now we will continue Trintellix  10 mg daily and Symbyax  6/50 mg it is helping her depression and OCD symptoms.  Patient is in close coronation with her fertility clinic provider.  Will follow-up in 3 months.  I encouraged to call back if she feels worsening of symptoms.   Follow Up Instructions:     I discussed the assessment and treatment plan with the patient. The patient was provided an opportunity to ask questions and all were answered. The patient agreed with the plan and demonstrated an understanding of the instructions.   The patient was advised to call back or seek an in-person evaluation if the symptoms worsen or if the condition fails to improve as anticipated.    Collaboration of Care: Other provider involved in patient's care AEB notes are available in epic to review.  Patient/Guardian was advised Release of Information must be obtained prior to any record release in order to collaborate their care with an outside provider. Patient/Guardian was advised if they have not already done so to contact the registration department to sign all necessary forms in order for us  to release information regarding their care.   Consent: Patient/Guardian gives verbal consent for treatment and assignment of benefits for services provided during this visit. Patient/Guardian expressed understanding and agreed to proceed.     Total encounter time 25 minutes.  Minutes which includes face-to-face time, chart reviewed,  care coordination, order entry and documentation during this encounter.   Note:  This document was prepared by [employer] voice dictation technology and any errors that results from this process are unintentional.    Leni ONEIDA Client, MD 01/22/2024

## 2024-01-31 ENCOUNTER — Emergency Department (HOSPITAL_COMMUNITY)

## 2024-01-31 ENCOUNTER — Other Ambulatory Visit: Payer: Self-pay

## 2024-01-31 ENCOUNTER — Emergency Department (HOSPITAL_COMMUNITY)
Admission: EM | Admit: 2024-01-31 | Discharge: 2024-01-31 | Disposition: A | Discharge status: Home / Self Care | Attending: Emergency Medicine | Admitting: Emergency Medicine

## 2024-01-31 ENCOUNTER — Encounter: Payer: Self-pay | Admitting: Hematology and Oncology

## 2024-01-31 DIAGNOSIS — Z87891 Personal history of nicotine dependence: Secondary | ICD-10-CM | POA: Diagnosis not present

## 2024-01-31 DIAGNOSIS — N981 Hyperstimulation of ovaries: Secondary | ICD-10-CM | POA: Diagnosis not present

## 2024-01-31 DIAGNOSIS — R1084 Generalized abdominal pain: Secondary | ICD-10-CM | POA: Diagnosis present

## 2024-01-31 LAB — CBC WITH DIFFERENTIAL/PLATELET
Abs Immature Granulocytes: 0.1 K/uL — ABNORMAL HIGH (ref 0.00–0.07)
Basophils Absolute: 0.1 K/uL (ref 0.0–0.1)
Basophils Relative: 1 %
Eosinophils Absolute: 0.1 K/uL (ref 0.0–0.5)
Eosinophils Relative: 1 %
HCT: 33.6 % — ABNORMAL LOW (ref 36.0–46.0)
Hemoglobin: 10.7 g/dL — ABNORMAL LOW (ref 12.0–15.0)
Immature Granulocytes: 1 %
Lymphocytes Relative: 23 %
Lymphs Abs: 2.4 K/uL (ref 0.7–4.0)
MCH: 27.5 pg (ref 26.0–34.0)
MCHC: 31.8 g/dL (ref 30.0–36.0)
MCV: 86.4 fL (ref 80.0–100.0)
Monocytes Absolute: 0.6 K/uL (ref 0.1–1.0)
Monocytes Relative: 6 %
Neutro Abs: 7.1 K/uL (ref 1.7–7.7)
Neutrophils Relative %: 68 %
Platelets: 346 K/uL (ref 150–400)
RBC: 3.89 MIL/uL (ref 3.87–5.11)
RDW: 14.4 % (ref 11.5–15.5)
WBC: 10.4 K/uL (ref 4.0–10.5)
nRBC: 0 % (ref 0.0–0.2)

## 2024-01-31 LAB — HCG, QUANTITATIVE, PREGNANCY: hCG, Beta Chain, Quant, S: 20 m[IU]/mL — ABNORMAL HIGH (ref <5)

## 2024-01-31 LAB — COMPREHENSIVE METABOLIC PANEL WITH GFR
ALT: 20 U/L (ref 0–44)
AST: 20 U/L (ref 15–41)
Albumin: 3.2 g/dL — ABNORMAL LOW (ref 3.5–5.0)
Alkaline Phosphatase: 98 U/L (ref 38–126)
Anion gap: 9 (ref 5–15)
BUN: 15 mg/dL (ref 6–20)
CO2: 18 mmol/L — ABNORMAL LOW (ref 22–32)
Calcium: 8.5 mg/dL — ABNORMAL LOW (ref 8.9–10.3)
Chloride: 107 mmol/L (ref 98–111)
Creatinine, Ser: 0.73 mg/dL (ref 0.44–1.00)
GFR, Estimated: >60 mL/min (ref >60)
Glucose, Bld: 136 mg/dL — ABNORMAL HIGH (ref 70–99)
Potassium: 3.4 mmol/L — ABNORMAL LOW (ref 3.5–5.1)
Sodium: 134 mmol/L — ABNORMAL LOW (ref 135–145)
Total Bilirubin: 0.6 mg/dL (ref 0.0–1.2)
Total Protein: 6.6 g/dL (ref 6.5–8.1)

## 2024-01-31 LAB — HCG, SERUM, QUALITATIVE: Preg, Serum: POSITIVE — AB

## 2024-01-31 LAB — LIPASE, BLOOD: Lipase: 26 U/L (ref 11–51)

## 2024-01-31 MED ORDER — ONDANSETRON 4 MG PO TBDP: 4.0000 mg | ORAL_TABLET | Freq: Three times a day (TID) | ORAL | 0 refills | Status: AC | PRN

## 2024-01-31 MED ORDER — SODIUM CHLORIDE 0.9 % IV SOLN
12.5000 mg | Freq: Four times a day (QID) | INTRAVENOUS | Status: DC | PRN
  Administered 2024-01-31: 12.5 mg via INTRAVENOUS
  Filled 2024-01-31: qty 0.5

## 2024-01-31 MED ORDER — FENTANYL CITRATE PF 50 MCG/ML IJ SOSY
50.0000 ug | PREFILLED_SYRINGE | Freq: Once | INTRAMUSCULAR | Status: AC
  Administered 2024-01-31: 50 ug via INTRAVENOUS
  Filled 2024-01-31: qty 1

## 2024-01-31 MED ORDER — SODIUM CHLORIDE 0.9 % IV BOLUS
1000.0000 mL | Freq: Once | INTRAVENOUS | Status: AC
  Administered 2024-01-31: 1000 mL via INTRAVENOUS

## 2024-01-31 MED ORDER — FENTANYL CITRATE PF 50 MCG/ML IJ SOSY
25.0000 ug | PREFILLED_SYRINGE | Freq: Once | INTRAMUSCULAR | Status: AC
  Administered 2024-01-31: 25 ug via INTRAVENOUS
  Filled 2024-01-31: qty 1

## 2024-01-31 MED ORDER — ONDANSETRON 4 MG PO TBDP
4.0000 mg | ORAL_TABLET | Freq: Once | ORAL | Status: AC
  Administered 2024-01-31: 4 mg via ORAL
  Filled 2024-01-31: qty 1

## 2024-01-31 MED ORDER — MORPHINE SULFATE (PF) 4 MG/ML IV SOLN
4.0000 mg | Freq: Once | INTRAVENOUS | Status: DC
  Filled 2024-01-31: qty 1

## 2024-01-31 MED ORDER — OXYCODONE HCL 5 MG PO TABS
5.0000 mg | ORAL_TABLET | Freq: Four times a day (QID) | ORAL | 0 refills | Status: AC | PRN
Start: 2024-01-31 — End: ?

## 2024-01-31 MED ORDER — IOHEXOL 350 MG/ML SOLN
75.0000 mL | Freq: Once | INTRAVENOUS | Status: AC | PRN
  Administered 2024-01-31: 75 mL via INTRAVENOUS

## 2024-01-31 NOTE — ED Provider Notes (Signed)
 [place] EMERGENCY DEPARTMENT AT [facility] Provider Note   CSN: [id number] Arrival date & time: 01/31/24  9967     Patient presents with: Abdominal Pain   Diana Page is a 33 y.o. female.  Patient with past medical history significant for ulcerative colitis, IIH, GERD, total colectomy, cholecystectomy, recent visit for extraction of eggs for future fertilization treatment who presents to the emergency room complaining of generalized abdominal pain.  The patient states that she had her procedure on Saturday.  She was told to watch for signs of infection.  She states that this morning she began to have severe nausea and abdominal discomfort, initially in the lower abdomen but now throughout the abdomen.  She called the nursing line who asked her to come to the emergency room for evaluation, specifically concerned about infection and possible OHSS.  The patient has an ostomy in place and states that her output has been grossly normal.  She has not been vomiting.  She reports a temperature at home of upwards of 100.8 F.  She denies chest pain, shortness of breath, urinary symptoms at this time.    Abdominal Pain      Prior to Admission medications   Medication Sig Start Date End Date Taking? Authorizing Provider  ondansetron  (ZOFRAN -ODT) 4 MG disintegrating tablet Take 1 tablet (4 mg total) by mouth every 8 (eight) hours as needed for nausea or vomiting. 01/31/24  Yes Logan Ubaldo NOVAK, PA-C  oxyCODONE  (ROXICODONE ) 5 MG immediate release tablet Take 1 tablet (5 mg total) by mouth every 6 (six) hours as needed for severe pain (pain score 7-10). 01/31/24  Yes Logan Ubaldo NOVAK, PA-C  acetaZOLAMIDE  ER (DIAMOX ) 500 MG capsule TAKE 2 CAPSULES (1,000 MG TOTAL) BY MOUTH AT BEDTIME. 12/13/23   Ines Onetha NOVAK, MD  ALPRAZolam  (XANAX ) 0.5 MG tablet Take one tab as needed for severe panic attacks. Patient not taking: Reported on 01/22/2024 05/08/23   Curry Leni DASEN, MD  Biotin 2500 MCG CHEW  Chew 1 tablet by mouth daily.    [provider]  fexofenadine (ALLEGRA ODT) 30 MG disintegrating tablet Take 30 mg by mouth daily.    [provider]  fluticasone (FLONASE) 50 MCG/ACT nasal spray Place 2 sprays into both nostrils daily.    [provider]  Incontinence Supplies (DRAINABLE FECAL COLLECTOR) MISC by Does not apply route. 09/06/14   [provider]  methylphenidate  (RITALIN ) 10 MG tablet Take 1 tablet (10 mg total) by mouth daily. Patient not taking: Reported on 01/22/2024 12/11/23 01/10/24  Arfeen, Leni DASEN, MD  OLANZapine -FLUoxetine  (SYMBYAX ) 6-50 MG capsule Take 1 capsule by mouth daily. 01/22/24   Arfeen, Leni DASEN, MD  Ostomy Supplies MISC by Does not apply route. 09/06/14   [provider]  pantoprazole  (PROTONIX ) 40 MG tablet Take 40 mg by mouth daily.    [provider]  SEMAGLUTIDE-WEIGHT MANAGEMENT Schlater Inject 5 mg into the skin once a week. Patient not taking: Reported on 01/22/2024    [provider]  Vitamin D , Ergocalciferol , (DRISDOL ) 1.25 MG (50000 UNIT) CAPS capsule TAKE 1 CAPSULE (50,000 UNITS TOTAL) BY MOUTH EVERY 7 (SEVEN) DAYS Patient not taking: Reported on 11/16/2023 09/06/23   Georgina Speaks, FNP  vortioxetine  HBr (TRINTELLIX ) 10 MG TABS tablet Take 1 tablet (10 mg total) by mouth daily. 01/22/24   Arfeen, Leni DASEN, MD    Allergies: Nsaids and Tolmetin    Review of Systems  Gastrointestinal:  Positive for abdominal pain.  Updated Vital Signs BP 113/62   Pulse 81   Temp 98 F (36.7 C) (Oral)   Resp 14   SpO2 100%   Physical Exam  (all labs ordered are listed, but only abnormal results are displayed) Labs Reviewed  CBC WITH DIFFERENTIAL/PLATELET - Abnormal; Notable for the following components:      Result Value   Hemoglobin 10.7 (*)    HCT 33.6 (*)    Abs Immature Granulocytes 0.10 (*)    All other components within normal limits  COMPREHENSIVE METABOLIC PANEL WITH GFR - Abnormal; Notable for the  following components:   Sodium 134 (*)    Potassium 3.4 (*)    CO2 18 (*)    Glucose, Bld 136 (*)    Calcium  8.5 (*)    Albumin 3.2 (*)    All other components within normal limits  HCG, SERUM, QUALITATIVE - Abnormal; Notable for the following components:   Preg, Serum POSITIVE (*)    All other components within normal limits  HCG, QUANTITATIVE, PREGNANCY - Abnormal; Notable for the following components:   hCG, Beta Chain, Quant, S 20 (*)    All other components within normal limits  LIPASE, BLOOD  URINALYSIS, ROUTINE W REFLEX MICROSCOPIC    EKG: None  Radiology: CT ABDOMEN PELVIS W CONTRAST Result Date: 01/31/2024 CLINICAL DATA:  Abdominal pain, acute, nonlocalized. Postoperative patient with recent egg removal for future in-vitro fertilization. Concern is for ovarian hyperstimulation syndrome. EXAM: CT ABDOMEN AND PELVIS WITH CONTRAST TECHNIQUE: Multidetector CT imaging of the abdomen and pelvis was performed using the standard protocol following bolus administration of intravenous contrast. RADIATION DOSE REDUCTION: This exam was performed according to the departmental dose-optimization program which includes automated exposure control, adjustment of the mA and/or kV according to patient size and/or use of iterative reconstruction technique. CONTRAST:  75mL OMNIPAQUE  IOHEXOL  350 MG/ML SOLN COMPARISON:  CT with IV contrast 08/07/2022 and 06/26/2021. FINDINGS: Lower chest: There is a new 5 mm squarish pleural-based left lower lobe nodule on 5:9. Lung bases are otherwise clear. There is no pleural effusion. The cardiac size is normal. There is no pericardial effusion. Hepatobiliary: No focal liver abnormality is seen. The liver is mildly steatotic measuring 23 cm in length. Status post cholecystectomy. No biliary dilatation. Pancreas: No abnormality. Spleen: Mild splenomegaly, splenic AP diameter 14.6 cm.  No mass. Adrenals/Urinary Tract: No abnormality. 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 The gastric wall is unremarkable. The small bowel is normal caliber without inflammatory change. There is no substantial parastomal hernia. Vascular/Lymphatic: No significant vascular findings are present. No enlarged abdominal or pelvic lymph nodes. Reproductive: There is bilateral ovarian enlargement new since the prior studies with multiple bilateral ovarian cysts. The ovarian enlargement is fairly symmetric and no solid mass component is seen. Individual cysts measure up to 3.3 cm and 17 Hounsfield units on the right on 3:67, and as large as 3.2 cm 19 Hounsfield units on the left on 3:66. There is also mild abdominal and pelvic free ascites. The presence of ascites and enlarged ovaries with multiple cysts in the setting of hormone treatment for in vitro fertilization, is consistent with ovarian hyperstimulation syndrome. Consultation with OBGYN is recommended. Other: The ascitic fluid ranges 28-34 Hounsfield units and is above the usual density of simple fluid indicating a proteinaceous content, but does not show heterogeneity such as would be expected with hemoperitoneum. There is no free air. There is mild body wall edema laterally at the  level of the hips, but anasarca is not seen elsewhere. There are no incarcerated hernias. Musculoskeletal: No acute or significant osseous findings. IMPRESSION: 1. Bilateral ovarian enlargement with multiple cysts, and mild abdominal and pelvic free ascites. The presence of ascites and enlarged ovaries with multiple cysts in the setting of hormone treatment for in vitro fertilization, is consistent with ovarian hyperstimulation syndrome. Consultation with OBGYN is recommended. 2. The ascitic fluid ranges 28-34 Hounsfield units and is above the usual density of simple fluid indicating a proteinaceous content, but does not show heterogeneity such as would be expected with hemoperitoneum. 3. Mild hepatic steatosis and  hepatosplenomegaly. 4. New 5 mm pleural-based left lower lobe nodule. Per Fleischner Society Guidelines, no routine follow-up imaging is recommended. These guidelines do not apply to immunocompromised patients and patients with cancer. Follow up in patients with significant comorbidities as clinically warranted. For lung cancer screening, adhere to Lung-RADS guidelines. Reference: Radiology. 2017; 284(1):228-43. 5. Mild body wall edema laterally at the level of the hips, but no further anasarca. 6. 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 Electronically Signed   By: Francis Quam M.D.   On: 01/31/2024 05:19     Procedures   Medications Ordered in the ED  morphine  (PF) 4 MG/ML injection 4 mg (0 mg Intravenous Hold 01/31/24 0450)  promethazine  (PHENERGAN ) 12.5 mg in sodium chloride  0.9 % 50 mL IVPB (0 mg Intravenous Stopped 01/31/24 0635)  ondansetron  (ZOFRAN -ODT) disintegrating tablet 4 mg (4 mg Oral Given 01/31/24 0217)  iohexol  (OMNIPAQUE ) 350 MG/ML injection 75 mL (75 mLs Intravenous Contrast Given 01/31/24 0431)  sodium chloride  0.9 % bolus 1,000 mL (0 mLs Intravenous Stopped 01/31/24 0611)  fentaNYL  (SUBLIMAZE ) injection 50 mcg (50 mcg Intravenous Given 01/31/24 0540)  sodium chloride  0.9 % bolus 1,000 mL (1,000 mLs Intravenous New Bag/Given 01/31/24 0613)                                    Medical Decision Making Amount and/or Complexity of Data Reviewed Labs: ordered. Radiology: ordered.  Risk Prescription drug management.   This patient presents to the ED for concern of abdominal pain, this involves an extensive number of treatment options, and is a complaint that carries with it a high risk of complications and morbidity.  The differential diagnosis includes OHSS, appendicitis, mesenteric ischemia, ovarian torsion, others   Co morbidities / Chronic conditions that complicate the patient evaluation  Injections for fertility treatments, recent extraction of egg,  total colectomy, ileostomy in place   Additional history obtained:  Additional history obtained from EMR   Lab Tests:  I Ordered, and personally interpreted labs.  The pertinent results include: Albumin 3.2, hCG 20 (likely due to recent fertility injections)   Imaging Studies ordered:  I ordered imaging studies including CT abdomen pelvis with contrast I independently visualized and interpreted imaging which showed  1. Bilateral ovarian enlargement with multiple cysts, and mild  abdominal and pelvic free ascites. The presence of ascites and  enlarged ovaries with multiple cysts in the setting of hormone  treatment for in vitro fertilization, is consistent with ovarian  hyperstimulation syndrome. Consultation with OBGYN is recommended.  2. The ascitic fluid ranges 28-34 Hounsfield units and is above the  usual density of simple fluid indicating a proteinaceous content,  but does not show heterogeneity such as would be expected with  hemoperitoneum.  3. Mild hepatic steatosis and hepatosplenomegaly.  4.  New 5 mm pleural-based left lower lobe nodule. Per Fleischner  Society Guidelines, no routine follow-up imaging is recommended.  These guidelines do not apply to immunocompromised patients and  patients with cancer. Follow up in patients with significant  comorbidities as clinically warranted. For lung cancer screening,  adhere to Lung-RADS guidelines. Reference: Radiology. 2017;  284(1):228-43.  5. Mild body wall edema laterally at the level of the hips, but no  further anasarca.  6. Total colectomy and right lower quadrant ileostomy, seen  previously.   I agree with the radiologist interpretation    Problem List / ED Course / Critical interventions / Medication management   I ordered medication including saline bolus, fentanyl , Phenergan , Zofran  Reevaluation of the patient after these medicines showed that the patient improved I have reviewed the patients home medicines  and have made adjustments as needed   Consultations Obtained:  I requested consultation with the on-call OB/GYN, Dr. Armond,  and discussed lab and imaging findings as well as pertinent plan - they recommend: Monitor patient for another 30 minutes to an hour.  Likely mild OHSS.  Recommend patient follow-up with the fertility clinic as an outpatient for further management as needed.   Social Determinants of Health:  Patient is a former smoker   Test / Admission - Considered:  Patient with presentation, labs, imaging most consistent with OHSS.  I discussed the case with the on-call OB/GYN who recommends outpatient management at this time.  She feels this is likely a mild case of OHSS.  Patient feeling better as far as pain control and nausea at this time.  She is tolerating oral intake.  I feel comfortable with discharging the patient home for outpatient follow-up after fluids.  Prescriptions for Zofran  and oxycodone  provided to patient.  Patient to follow-up with fertility clinic and OB/GYN for further management as needed.  Patient care signed out to Susette Lash, PA-C at shift handoff. Patient to discharge home unless condition changes during fluid administration      Final diagnoses:  Ovarian hyperstimulation syndrome    ED Discharge Orders          Ordered    ondansetron  (ZOFRAN -ODT) 4 MG disintegrating tablet  Every 8 hours PRN        01/31/24 0614    oxyCODONE  (ROXICODONE ) 5 MG immediate release tablet  Every 6 hours PRN        01/31/24 0614               Logan Ubaldo NOVAK, PA-C 01/31/24 [id number]    Trine Raynell Moder, MD 01/31/24 [phone]

## 2024-01-31 NOTE — Discharge Instructions (Addendum)
 Your workup this morning was consistent with ovarian hyperstimulation syndrome.  This is likely related to your recent fertility injections.  Please follow-up with your fertility clinic for further recommendations.  If you develop any life-threatening symptoms please return immediately to the emergency department.  This could include spiking a sudden fever, intractable nausea and vomiting, or intractable abdominal pain.  I have prescribed oxycodone  for additional pain control and Zofran  for nausea.  Take as directed.

## 2024-01-31 NOTE — ED Notes (Signed)
 Pt unable to void at this time.

## 2024-01-31 NOTE — ED Provider Triage Note (Signed)
 Emergency Medicine Provider Triage Evaluation Note  Diana Page , a 33 y.o. female  was evaluated in triage.  Pt complains of generalized abdominal pain, worse in the lower abdomen for the past 1 to 2 days.  She states that on Saturday she had eggs extracted for a later in vitro fertilization procedure.  The patient does have an ileostomy in place.  She endorses nausea without vomiting.  She states that her stool output seems to be consistent with baseline.  She had a fever at home of 100.8 Fahrenheit.  Review of Systems  Positive:  Negative:   Physical Exam  BP (!) 118/59 (BP Location: Right Arm)   Pulse (!) 117   Temp 99 F (37.2 C)   Resp 20   SpO2 100%  Gen:   Awake, no distress   Resp:  Normal effort  MSK:   Moves extremities without difficulty  Other:    Medical Decision Making  Medically screening exam initiated at 1:58 AM.  Appropriate orders placed.  Phillip Sandler was informed that the remainder of the evaluation will be completed by another provider, this initial triage assessment does not replace that evaluation, and the importance of remaining in the ED until their evaluation is complete.     Logan Ubaldo NOVAK, PA-C 01/31/24 [phone]

## 2024-01-31 NOTE — ED Provider Notes (Signed)
 Patient signed out to me.  See previous note for full details.  Pending reevaluation after fluids and if patient feels improved she is appropriate for discharge. Physical Exam  BP 108/60   Pulse 84   Temp 98 F (36.7 C) (Oral)   Resp 16   SpO2 100%     Procedures  Procedures  ED Course / MDM    Medical Decision Making Amount and/or Complexity of Data Reviewed Labs: ordered. Radiology: ordered.  Risk Prescription drug management.   Fluids completed.  Patient feeling improved.  Would like an additional dose of pain medicine prior to discharge.  Pain medicine has been sent into the pharmacy by previous provider. Dose of fentanyl  given.  Patient is in agreement with plan.  Discharged in stable condition.  Strict return precautions discussed.        Hildegard Loge, PA-C 01/31/24 [id number]    Ula Prentice SAUNDERS, MD 02/01/24 [phone]

## 2024-01-31 NOTE — ED Triage Notes (Signed)
 Patient reports pain across lower abdomen with nausea this evening , low grade fever , denies emesis or chills .

## 2024-02-14 ENCOUNTER — Encounter: Payer: Self-pay | Admitting: Nurse Practitioner

## 2024-04-22 ENCOUNTER — Telehealth (HOSPITAL_COMMUNITY): Admitting: Psychiatry

## 2024-04-22 DIAGNOSIS — F410 Panic disorder [episodic paroxysmal anxiety] without agoraphobia: Secondary | ICD-10-CM | POA: Diagnosis not present

## 2024-04-22 DIAGNOSIS — F330 Major depressive disorder, recurrent, mild: Secondary | ICD-10-CM | POA: Diagnosis not present

## 2024-04-22 DIAGNOSIS — F429 Obsessive-compulsive disorder, unspecified: Secondary | ICD-10-CM | POA: Diagnosis not present

## 2024-04-22 DIAGNOSIS — F900 Attention-deficit hyperactivity disorder, predominantly inattentive type: Secondary | ICD-10-CM | POA: Diagnosis not present

## 2024-04-22 MED ORDER — OLANZAPINE-FLUOXETINE HCL 6-50 MG PO CAPS: 1.0000 | ORAL_CAPSULE | Freq: Every day | ORAL | 0 refills | Status: DC

## 2024-04-22 MED ORDER — VORTIOXETINE HBR 10 MG PO TABS: 10.0000 mg | ORAL_TABLET | Freq: Every day | ORAL | 2 refills | Status: DC

## 2024-04-22 NOTE — Progress Notes (Signed)
 [facility] MD Virtual Progress Note   Patient Location: Home Provider Location: Home Office  I connect with patient by video and verified that I am speaking with correct person by using two identifiers. I discussed the limitations of evaluation and management by telemedicine and the availability of in person appointments. I also discussed with the patient that there may be a patient responsible charge related to this service. The patient expressed understanding and agreed to proceed.  Diana Page [phone] 33 y.o.  04/22/2024 10:02 AM  History of Present Illness:  Patient is evaluated by video session.  She is now pregnant and handling the pregnancy very well.  She denies any panic attack, crying spells or any feeling of hopelessness or worthlessness.  She had consulted her fetal medical doctor and taking the Trintellix  and Symbyax .  She is no longer taking Xanax  and Ritalin  which was discontinued as per advised by fertility clinic.  She reported sometimes feeling tired and struggle with attention and focus but overall medicine working.  She denies any suicidal thoughts or homicidal thoughts.  She has a good support from her husband.  She denies any paranoia, suicidal thoughts.  She reported sometime nausea but appetite is okay.  Her OCD thoughts are under control.  She is in therapy with [facility] and that has been very helpful.  She is going to see her OB/GYN this Thursday and will get an ultrasound and follow-up.  She has no tremor or shakes or any EPS.  She would like to keep the current medication.  Past Psychiatric History: H/O depression and OCD.  Tried Prozac  in [place] .  No h/o hallucination, paranoia, suicidal attempt or inpatient treatment.  Symbyax  worked. Tried Xanax  as needed. Luvox  caused sexual side effects and Wellbutrin  did not helped.Vyvanse  expensive.    Past Medical History:  Diagnosis Date   Anemia    Anxiety    Back pain    Depression    Fatty liver     GERD (gastroesophageal reflux disease)    Headache 01/04/2015   Hx of cholecystectomy    Hypertension    Intercranial Hypertension   IBS (irritable bowel syndrome)    Joint pain    Obsessive compulsive disorder 11/2012   Ostomy nurse consultation    Pancreatitis    hx of    UC (ulcerative colitis) [facility])     Outpatient Encounter Medications as of 04/22/2024  Medication Sig   acetaZOLAMIDE  ER (DIAMOX ) 500 MG capsule TAKE 2 CAPSULES (1,000 MG TOTAL) BY MOUTH AT BEDTIME.   ALPRAZolam  (XANAX ) 0.5 MG tablet Take one tab as needed for severe panic attacks. (Patient not taking: Reported on 01/22/2024)   Biotin 2500 MCG CHEW Chew 1 tablet by mouth daily.   fexofenadine (ALLEGRA ODT) 30 MG disintegrating tablet Take 30 mg by mouth daily.   fluticasone (FLONASE) 50 MCG/ACT nasal spray Place 2 sprays into both nostrils daily.   Incontinence Supplies (DRAINABLE FECAL COLLECTOR) MISC by Does not apply route.   methylphenidate  (RITALIN ) 10 MG tablet Take 1 tablet (10 mg total) by mouth daily. (Patient not taking: Reported on 01/22/2024)   OLANZapine -FLUoxetine  (SYMBYAX ) 6-50 MG capsule Take 1 capsule by mouth daily.   ondansetron  (ZOFRAN -ODT) 4 MG disintegrating tablet Take 1 tablet (4 mg total) by mouth every 8 (eight) hours as needed for nausea or vomiting.   Ostomy Supplies MISC by Does not apply route.   oxyCODONE  (ROXICODONE ) 5 MG immediate release tablet Take 1 tablet (5 mg total) by mouth every  6 (six) hours as needed for severe pain (pain score 7-10).   pantoprazole  (PROTONIX ) 40 MG tablet Take 40 mg by mouth daily.   SEMAGLUTIDE-WEIGHT MANAGEMENT [place] Inject 5 mg into the skin once a week. (Patient not taking: Reported on 01/22/2024)   Vitamin D , Ergocalciferol , (DRISDOL ) 1.25 MG (50000 UNIT) CAPS capsule TAKE 1 CAPSULE (50,000 UNITS TOTAL) BY MOUTH EVERY 7 (SEVEN) DAYS (Patient not taking: Reported on 11/16/2023)   vortioxetine  HBr (TRINTELLIX ) 10 MG TABS tablet Take 1 tablet (10 mg total) by  mouth daily.   No facility-administered encounter medications on file as of 04/22/2024.    Recent Results (from the past 2160 hours)  CBC with Differential     Status: Abnormal   Collection Time: 01/31/24 12:46 AM  Result Value Ref Range   WBC 10.4 4.0 - 10.5 K/uL   RBC 3.89 3.87 - 5.11 MIL/uL   Hemoglobin 10.7 (L) 12.0 - 15.0 g/dL   HCT 66.3 (L) 63.9 - 53.9 %   MCV 86.4 80.0 - 100.0 fL   MCH 27.5 26.0 - 34.0 pg   MCHC 31.8 30.0 - 36.0 g/dL   RDW 85.5 88.4 - 84.4 %   Platelets 346 150 - 400 K/uL   nRBC 0.0 0.0 - 0.2 %   Neutrophils Relative % 68 %   Neutro Abs 7.1 1.7 - 7.7 K/uL   Lymphocytes Relative 23 %   Lymphs Abs 2.4 0.7 - 4.0 K/uL   Monocytes Relative 6 %   Monocytes Absolute 0.6 0.1 - 1.0 K/uL   Eosinophils Relative 1 %   Eosinophils Absolute 0.1 0.0 - 0.5 K/uL   Basophils Relative 1 %   Basophils Absolute 0.1 0.0 - 0.1 K/uL   Immature Granulocytes 1 %   Abs Immature Granulocytes 0.10 (H) 0.00 - 0.07 K/uL    Comment: Performed at [facility], [address], [place], [place] [address]  Comprehensive metabolic panel     Status: Abnormal   Collection Time: 01/31/24 12:46 AM  Result Value Ref Range   Sodium 134 (L) 135 - 145 mmol/L   Potassium 3.4 (L) 3.5 - 5.1 mmol/L   Chloride 107 98 - 111 mmol/L   CO2 18 (L) 22 - 32 mmol/L   Glucose, Bld 136 (H) 70 - 99 mg/dL    Comment: Glucose reference range applies only to samples taken after fasting for at least 8 hours.   BUN 15 6 - 20 mg/dL   Creatinine, Ser 9.26 0.44 - 1.00 mg/dL   Calcium  8.5 (L) 8.9 - 10.3 mg/dL   Total Protein 6.6 6.5 - 8.1 g/dL   Albumin 3.2 (L) 3.5 - 5.0 g/dL   AST 20 15 - 41 U/L   ALT 20 0 - 44 U/L   Alkaline Phosphatase 98 38 - 126 U/L   Total Bilirubin 0.6 0.0 - 1.2 mg/dL   GFR, Estimated >39 >39 mL/min    Comment: (NOTE) Calculated using the CKD-EPI Creatinine Equation (2021)    Anion gap 9 5 - 15    Comment: Performed at [facility], [address] [place], [place]  [address]  hCG, serum, qualitative     Status: Abnormal   Collection Time: 01/31/24 12:46 AM  Result Value Ref Range   Preg, Serum POSITIVE (A) NEGATIVE    Comment:        THE SENSITIVITY OF THIS METHODOLOGY IS >10 mIU/mL. Performed at [facility], [address], [place], [place] [address]  Lipase, blood     Status: None   Collection Time: 01/31/24 12:46 AM  Result Value Ref Range   Lipase 26 11 - 51 U/L    Comment: Performed at [facility], [address], [place], [place] [address]  hCG, quantitative, pregnancy     Status: Abnormal   Collection Time: 01/31/24 12:46 AM  Result Value Ref Range   hCG, Beta Chain, Quant, S 20 (H) <5 mIU/mL    Comment:          GEST. AGE      CONC.  (mIU/mL)   <=1 WEEK        5 - 50     2 WEEKS       50 - 500     3 WEEKS       100 - 10,000     4 WEEKS     1,000 - 30,000     5 WEEKS     3,500 - 115,000   6-8 WEEKS     12,000 - 270,000    12 WEEKS     15,000 - 220,000        FEMALE AND NON-PREGNANT FEMALE:     LESS THAN 5 mIU/mL Performed at [facility], [address], [place], [place] [address]      Psychiatric Specialty Exam: Physical Exam  Review of Systems  Constitutional:        Tired  All other systems reviewed and are negative.   There were no vitals taken for this visit.There is no height or weight on file to calculate BMI.  General Appearance: Casual  Eye Contact:  Good  Speech:  Clear and Coherent  Volume:  Normal  Mood:  Euthymic  Affect:  Appropriate  Thought Process:  Goal Directed  Orientation:  Full (Time, Place, and Person)  Thought Content:  Logical  Suicidal Thoughts:  No  Homicidal Thoughts:  No  Memory:  Immediate;   Good Recent;   Good Remote;   Good  Judgement:  Intact  Insight:  Present  Psychomotor Activity:  Normal  Concentration:  Concentration: Good and Attention Span: Good  Recall:  Good  Fund of Knowledge:  Good  Language:  Good  Akathisia:  No  Handed:  Right  AIMS (if  indicated):     Assets:  Communication Skills Desire for Improvement Housing Social Support Talents/Skills Transportation  ADL's:  Intact  Cognition:  WNL  Sleep:  ok       11/16/2023    2:39 PM 05/02/2023   11:05 AM 03/24/2021    9:41 AM 09/28/2020   10:28 AM 06/04/2018    2:19 PM  Depression screen PHQ 2/9  Decreased Interest 0 1 1 1 2   Down, Depressed, Hopeless 0 1 1 1 2   PHQ - 2 Score 0 2 2 2 4   Altered sleeping 0 2 3 1 3   Tired, decreased energy 0 3 3 1 3   Change in appetite 0 1 0 0 2  Feeling bad or failure about yourself  0 0 1 0 3  Trouble concentrating 0 0 3 0 0  Moving slowly or fidgety/restless 0 0 0 0 0  Suicidal thoughts 0 0 0 0 0  PHQ-9 Score 0 8 12 4 15   Difficult doing work/chores Not difficult at all Somewhat difficult Very difficult Somewhat difficult     Assessment/Plan: MDD (major depressive disorder), recurrent episode, mild - Plan: OLANZapine -FLUoxetine  (SYMBYAX ) 6-50 MG capsule, vortioxetine  HBr (  TRINTELLIX ) 10 MG TABS tablet  Obsessive-compulsive disorder, unspecified type - Plan: OLANZapine -FLUoxetine  (SYMBYAX ) 6-50 MG capsule, vortioxetine  HBr (TRINTELLIX ) 10 MG TABS tablet  Attention deficit hyperactivity disorder (ADHD), predominantly inattentive type - Plan: vortioxetine  HBr (TRINTELLIX ) 10 MG TABS tablet  Panic attack - Plan: vortioxetine  HBr (TRINTELLIX ) 10 MG TABS tablet  Patient is 33 year old married female who is now pregnant after IVF from [place] [facility] and now seeing [facility].  She is on Trintellix  and Symbyax .  We had a long discussion about psychotropic medication in the pregnancy.  Patient has been okay by fertility clinic and fetal medication Dr. To continue these 2 medications.  She used to take Xanax  and Ritalin  which was discontinued.  So far she is doing well and her symptoms are stable.  She sometimes feel tired and struggled with focus but realized it could be because not taking stimulants.  She is going this  Thursday for ultrasound and she will also get her weight and further workup.  She like to continue Trintellix  10 mg daily and Symbyax  6/50 mg.  She reported it is helping her depression, OCD and anxiety.  Encouraged to continue therapy with Dr. Donnice.  Will follow-up in 3 months. Recommend to call back if she is any question or any concern.    Follow Up Instructions:     I discussed the assessment and treatment plan with the patient. The patient was provided an opportunity to ask questions and all were answered. The patient agreed with the plan and demonstrated an understanding of the instructions.   The patient was advised to call back or seek an in-person evaluation if the symptoms worsen or if the condition fails to improve as anticipated.    Collaboration of Care: Other provider involved in patient's care AEB notes are available in epic to review  Patient/Guardian was advised Release of Information must be obtained prior to any record release in order to collaborate their care with an outside provider. Patient/Guardian was advised if they have not already done so to contact the registration department to sign all necessary forms in order for us  to release information regarding their care.   Consent: Patient/Guardian gives verbal consent for treatment and assignment of benefits for services provided during this visit. Patient/Guardian expressed understanding and agreed to proceed.     Total encounter time 18 minutes which includes face-to-face time, chart reviewed, care coordination, order entry and documentation during this encounter.   Note: This document was prepared by [employer] voice dictation technology and any errors that results from this process are unintentional.    Leni ONEIDA Client, MD 04/22/2024

## 2024-06-17 ENCOUNTER — Other Ambulatory Visit: Payer: Self-pay

## 2024-06-17 MED ORDER — ACETAZOLAMIDE ER 500 MG PO CP12: 1000.0000 mg | ORAL_CAPSULE | Freq: Every evening | ORAL | 1 refills | Status: DC

## 2024-06-17 NOTE — Telephone Encounter (Signed)
 last prescibed by :  Dr. Ines  Last refilled by patient on : 03/13/24 as a 90 day refill  Last office visit : 11/07/23 Next office visit : sent a mychart message to call and shedule an office visit  Per last office note : Continue diamox 

## 2024-07-01 ENCOUNTER — Telehealth (HOSPITAL_COMMUNITY): Payer: Self-pay | Admitting: *Deleted

## 2024-07-01 NOTE — Telephone Encounter (Signed)
 Since she has not taken the medication for past few days, recommend to try 5 mg for few days and then take the 10 mg.

## 2024-07-01 NOTE — Telephone Encounter (Signed)
 Pt says that she has already picked up the 10 mg prescription. She says that this has been cleared by her OB, Dr. Burnard File at [facility]. Wants to discuss alternatives at her next appointment on 07/24/24.

## 2024-07-01 NOTE — Telephone Encounter (Signed)
 I did advise her of that but she says OB has signed off on the 10 mg and that's what she started back on. I reiterated that was your recommendation.

## 2024-07-01 NOTE — Telephone Encounter (Signed)
 We have discussed on her last visit that Symbyax  and Trintellix  should be taken only if it is cleared by her fertility clinic doctor.  If they agree then she can take these medication.  Since she is without Trintellix  a few days she can restart with 5 mg only.  She used to take 10 mg but I will wait until her next appointment.

## 2024-07-01 NOTE — Telephone Encounter (Signed)
 Pt called to advise that she is pregnant and has not taken Trintellix  10 mg in 5 days as pharmacy has had backorder. Pt is asking if she should resume medication as pharmacy now has the medication in stock. Pt f/u scheduled for 07/24/24. Please review and advise.

## 2024-07-17 ENCOUNTER — Encounter: Payer: Self-pay | Admitting: Hematology and Oncology

## 2024-07-23 ENCOUNTER — Other Ambulatory Visit: Payer: Self-pay | Admitting: Diagnostic Neuroimaging

## 2024-07-24 ENCOUNTER — Telehealth (HOSPITAL_COMMUNITY): Payer: Self-pay | Admitting: Psychiatry

## 2024-07-24 ENCOUNTER — Encounter (HOSPITAL_COMMUNITY): Payer: Self-pay | Admitting: Psychiatry

## 2024-07-24 DIAGNOSIS — F410 Panic disorder [episodic paroxysmal anxiety] without agoraphobia: Secondary | ICD-10-CM

## 2024-07-24 DIAGNOSIS — F900 Attention-deficit hyperactivity disorder, predominantly inattentive type: Secondary | ICD-10-CM

## 2024-07-24 DIAGNOSIS — F330 Major depressive disorder, recurrent, mild: Secondary | ICD-10-CM

## 2024-07-24 DIAGNOSIS — F429 Obsessive-compulsive disorder, unspecified: Secondary | ICD-10-CM

## 2024-07-24 MED ORDER — VORTIOXETINE HBR 10 MG PO TABS: 10.0000 mg | ORAL_TABLET | Freq: Every day | ORAL | 2 refills | Status: AC

## 2024-07-24 MED ORDER — OLANZAPINE-FLUOXETINE HCL 6-50 MG PO CAPS: 1.0000 | ORAL_CAPSULE | Freq: Every day | ORAL | 0 refills | Status: AC

## 2024-07-24 NOTE — Progress Notes (Signed)
 " [facility] MD Virtual Progress Note   Patient Location: Home Provider Location: Home Office  I connect with patient by video and verified that I am speaking with correct person by using two identifiers. I discussed the limitations of evaluation and management by telemedicine and the availability of in person appointments. I also discussed with the patient that there may be a patient responsible charge related to this service. The patient expressed understanding and agreed to proceed.  Diana Page [phone] 34 y.o.  07/24/2024 10:33 AM  History of Present Illness:  Patient is evaluated by video session.  She reported pregnancy is going okay other than she is vomiting and having nausea.  Her OB/GYN recommended take promethazine  and avoid taking Zofran .  She is taking Symbyax  and Trintellix  after she discussed with her fetal medicine doctor and OB/GYN.  She is no longer taking narcotic pain medicine, Xanax  and Ritalin .  She has a regular follow-up with the OB/GYN.  She is going to have a anatomy scan on February 7.  She is having a baby boy and she is doing June.  She has good support from her husband and family.  She reported last 7 pound due to nausea and vomiting and gained only 1 pound back.  She reported around Christmas most of her family member was sick.  She feels the Symbyax  and Trintellix  keeping her mood stable.  She denies any crying spells, hopelessness, suicidal thoughts.  She is also in therapy which is going very well.  She has no tremor or shakes or any EPS.  Sometimes she struggle with attention concentration since not taking Ritalin  but symptoms are manageable.  Past Psychiatric History: H/O depression and OCD.  Tried Prozac  in [place] .  No h/o hallucination, paranoia, suicidal attempt or inpatient treatment.  Symbyax  worked. Tried Xanax  as needed. Luvox  caused sexual side effects and Wellbutrin  did not helped.Vyvanse  expensive.    Past Medical History:   Diagnosis Date   Anemia    Anxiety    Back pain    Depression    Fatty liver    GERD (gastroesophageal reflux disease)    Headache 01/04/2015   Hx of cholecystectomy    Hypertension    Intercranial Hypertension   IBS (irritable bowel syndrome)    Joint pain    Obsessive compulsive disorder 11/2012   Ostomy nurse consultation    Pancreatitis    hx of    UC (ulcerative colitis) [facility])     Outpatient Encounter Medications as of 07/24/2024  Medication Sig   acetaZOLAMIDE  ER (DIAMOX ) 500 MG capsule Take 2 capsules (1,000 mg total) by mouth at bedtime. Please call [phone] to schedule next office visit for further refills.   ALPRAZolam  (XANAX ) 0.5 MG tablet Take one tab as needed for severe panic attacks. (Patient not taking: Reported on 01/22/2024)   Biotin 2500 MCG CHEW Chew 1 tablet by mouth daily.   fexofenadine (ALLEGRA ODT) 30 MG disintegrating tablet Take 30 mg by mouth daily.   fluticasone (FLONASE) 50 MCG/ACT nasal spray Place 2 sprays into both nostrils daily.   Incontinence Supplies (DRAINABLE FECAL COLLECTOR) MISC by Does not apply route.   methylphenidate  (RITALIN ) 10 MG tablet Take 1 tablet (10 mg total) by mouth daily. (Patient not taking: Reported on 01/22/2024)   OLANZapine -FLUoxetine  (SYMBYAX ) 6-50 MG capsule Take 1 capsule by mouth daily.   ondansetron  (ZOFRAN -ODT) 4 MG disintegrating tablet Take 1 tablet (4 mg total) by mouth every 8 (eight) hours as needed for nausea  or vomiting.   Ostomy Supplies MISC by Does not apply route.   oxyCODONE  (ROXICODONE ) 5 MG immediate release tablet Take 1 tablet (5 mg total) by mouth every 6 (six) hours as needed for severe pain (pain score 7-10).   pantoprazole  (PROTONIX ) 40 MG tablet Take 40 mg by mouth daily.   SEMAGLUTIDE-WEIGHT MANAGEMENT  Inject 5 mg into the skin once a week. (Patient not taking: Reported on 01/22/2024)   Vitamin D , Ergocalciferol , (DRISDOL ) 1.25 MG (50000 UNIT) CAPS capsule TAKE 1 CAPSULE (50,000 UNITS  TOTAL) BY MOUTH EVERY 7 (SEVEN) DAYS (Patient not taking: Reported on 11/16/2023)   vortioxetine  HBr (TRINTELLIX ) 10 MG TABS tablet Take 1 tablet (10 mg total) by mouth daily.   [DISCONTINUED] OLANZapine -FLUoxetine  (SYMBYAX ) 6-50 MG capsule Take 1 capsule by mouth daily.   [DISCONTINUED] vortioxetine  HBr (TRINTELLIX ) 10 MG TABS tablet Take 1 tablet (10 mg total) by mouth daily.   No facility-administered encounter medications on file as of 07/24/2024.    No results found for this or any previous visit (from the past 2160 hours).    Psychiatric Specialty Exam: Physical Exam  Review of Systems  Gastrointestinal:  Positive for nausea and vomiting.  All other systems reviewed and are negative.   There were no vitals taken for this visit.There is no height or weight on file to calculate BMI.  General Appearance: Casual and lying on her bed  Eye Contact:  Good  Speech:  Clear and Coherent  Volume:  Normal  Mood:  Euthymic  Affect:  Appropriate  Thought Process:  Goal Directed  Orientation:  Full (Time, Place, and Person)  Thought Content:  Logical  Suicidal Thoughts:  No  Homicidal Thoughts:  No  Memory:  Immediate;   Good Recent;   Good Remote;   Good  Judgement:  Intact  Insight:  Present  Psychomotor Activity:  Normal  Concentration:  Concentration: Good and Attention Span: Good  Recall:  Good  Fund of Knowledge:  Good  Language:  Good  Akathisia:  No  Handed:  Right  AIMS (if indicated):     Assets:  Communication Skills Desire for Improvement Housing Social Support Talents/Skills Transportation  ADL's:  Intact  Cognition:  WNL  Sleep:  ok       11/16/2023    2:39 PM 05/02/2023   11:05 AM 03/24/2021    9:41 AM 09/28/2020   10:28 AM 06/04/2018    2:19 PM  Depression screen PHQ 2/9  Decreased Interest 0 1 1 1 2   Down, Depressed, Hopeless 0 1 1 1 2   PHQ - 2 Score 0 2 2 2 4   Altered sleeping 0 2 3 1 3   Tired, decreased energy 0 3 3 1 3   Change in appetite 0 1 0 0 2   Feeling bad or failure about yourself  0 0 1 0 3  Trouble concentrating 0 0 3 0 0  Moving slowly or fidgety/restless 0 0 0 0 0  Suicidal thoughts 0 0 0 0 0  PHQ-9 Score 0  8  12  4  15    Difficult doing work/chores Not difficult at all Somewhat difficult Very difficult Somewhat difficult      Data saved with a previous flowsheet row definition    Assessment/Plan: MDD (major depressive disorder), recurrent episode, mild - Plan: OLANZapine -FLUoxetine  (SYMBYAX ) 6-50 MG capsule, vortioxetine  HBr (TRINTELLIX ) 10 MG TABS tablet  Obsessive-compulsive disorder, unspecified type - Plan: OLANZapine -FLUoxetine  (SYMBYAX ) 6-50 MG capsule, vortioxetine  HBr (TRINTELLIX ) 10 MG TABS tablet  Attention deficit hyperactivity disorder (ADHD), predominantly inattentive type - Plan: vortioxetine  HBr (TRINTELLIX ) 10 MG TABS tablet  Panic attack - Plan: vortioxetine  HBr (TRINTELLIX ) 10 MG TABS tablet  Patient is 34 year old married female who is pregnant and due in June.  Discussed current medication.  She is no longer taking narcotic pain medicine, Ritalin  and Xanax  for a while.  Her fetal medicine doctor and OB/GYN approved to continue Trintellix  10 mg and Symbyax  6/50.  So far no major concern or side effects.  She is also seeing a therapy with Dr. Donnice and and that is going very well.  Encouraged to keep the current medication.  Discussed medication side effects and benefits.  Recommend to call back if she is any question or any concern.  Follow-up in 3 months.  Follow Up Instructions:     I discussed the assessment and treatment plan with the patient. The patient was provided an opportunity to ask questions and all were answered. The patient agreed with the plan and demonstrated an understanding of the instructions.   The patient was advised to call back or seek an in-person evaluation if the symptoms worsen or if the condition fails to improve as anticipated.    Collaboration of Care: Other provider  involved in patient's care AEB notes are available in epic to review  Patient/Guardian was advised Release of Information must be obtained prior to any record release in order to collaborate their care with an outside provider. Patient/Guardian was advised if they have not already done so to contact the registration department to sign all necessary forms in order for us  to release information regarding their care.   Consent: Patient/Guardian gives verbal consent for treatment and assignment of benefits for services provided during this visit. Patient/Guardian expressed understanding and agreed to proceed.     Total encounter time 17 minutes which includes face-to-face time, chart reviewed, care coordination, order entry and documentation during this encounter.   Note: This document was prepared by [employer] voice dictation technology and any errors that results from this process are unintentional.    Leni ONEIDA Client, MD 07/24/2024   "

## 2024-07-25 ENCOUNTER — Encounter: Payer: Self-pay | Admitting: Hematology and Oncology

## 2024-07-25 NOTE — Telephone Encounter (Signed)
 Last refilled by patient on : 06/26/2024 Last office visit : 11/07/23 with Dr. Ines Next office visit : patient is due back 11/06/24 Per last office note: Continue diamox 

## 2024-07-25 NOTE — Telephone Encounter (Signed)
 Called and LVM for pt to call back and schedule a f/u.

## 2024-10-23 ENCOUNTER — Telehealth (HOSPITAL_COMMUNITY): Payer: Self-pay | Admitting: Psychiatry

## 2024-10-28 ENCOUNTER — Ambulatory Visit: Admitting: Diagnostic Neuroimaging
